# Patient Record
Sex: Female | Born: 1960
Health system: Southern US, Community
[De-identification: ages and names within clinical notes are randomized; demographics above are authoritative.]

## PROBLEM LIST (undated history)

## (undated) ENCOUNTER — Emergency Department: Disposition: A | Discharge status: Home / Self Care | Payer: Self-pay

## (undated) DIAGNOSIS — D649 Anemia, unspecified: Secondary | ICD-10-CM

## (undated) DIAGNOSIS — Z8709 Personal history of other diseases of the respiratory system: Secondary | ICD-10-CM

## (undated) DIAGNOSIS — G971 Other reaction to spinal and lumbar puncture: Secondary | ICD-10-CM

## (undated) DIAGNOSIS — I447 Left bundle-branch block, unspecified: Secondary | ICD-10-CM

## (undated) DIAGNOSIS — Z950 Presence of cardiac pacemaker: Secondary | ICD-10-CM

## (undated) DIAGNOSIS — Z95810 Presence of automatic (implantable) cardiac defibrillator: Secondary | ICD-10-CM

## (undated) DIAGNOSIS — Z8619 Personal history of other infectious and parasitic diseases: Secondary | ICD-10-CM

## (undated) DIAGNOSIS — G43909 Migraine, unspecified, not intractable, without status migrainosus: Secondary | ICD-10-CM

## (undated) DIAGNOSIS — T7840XA Allergy, unspecified, initial encounter: Secondary | ICD-10-CM

## (undated) DIAGNOSIS — R42 Dizziness and giddiness: Secondary | ICD-10-CM

## (undated) DIAGNOSIS — IMO0001 Reserved for inherently not codable concepts without codable children: Secondary | ICD-10-CM

## (undated) DIAGNOSIS — M25551 Pain in right hip: Secondary | ICD-10-CM

## (undated) DIAGNOSIS — I341 Nonrheumatic mitral (valve) prolapse: Secondary | ICD-10-CM

## (undated) DIAGNOSIS — F32A Depression, unspecified: Secondary | ICD-10-CM

## (undated) DIAGNOSIS — E785 Hyperlipidemia, unspecified: Secondary | ICD-10-CM

## (undated) DIAGNOSIS — I420 Dilated cardiomyopathy: Secondary | ICD-10-CM

## (undated) DIAGNOSIS — F329 Major depressive disorder, single episode, unspecified: Secondary | ICD-10-CM

## (undated) DIAGNOSIS — I5020 Unspecified systolic (congestive) heart failure: Secondary | ICD-10-CM

## (undated) DIAGNOSIS — I319 Disease of pericardium, unspecified: Secondary | ICD-10-CM

## (undated) DIAGNOSIS — I10 Essential (primary) hypertension: Secondary | ICD-10-CM

## (undated) DIAGNOSIS — G629 Polyneuropathy, unspecified: Secondary | ICD-10-CM

## (undated) DIAGNOSIS — K219 Gastro-esophageal reflux disease without esophagitis: Secondary | ICD-10-CM

## (undated) DIAGNOSIS — R059 Cough, unspecified: Secondary | ICD-10-CM

## (undated) DIAGNOSIS — R51 Headache: Secondary | ICD-10-CM

## (undated) DIAGNOSIS — R05 Cough: Secondary | ICD-10-CM

## (undated) DIAGNOSIS — N183 Chronic kidney disease, stage 3 unspecified: Secondary | ICD-10-CM

## (undated) DIAGNOSIS — R011 Cardiac murmur, unspecified: Secondary | ICD-10-CM

## (undated) DIAGNOSIS — I5022 Chronic systolic (congestive) heart failure: Secondary | ICD-10-CM

## (undated) DIAGNOSIS — J189 Pneumonia, unspecified organism: Secondary | ICD-10-CM

## (undated) HISTORY — DX: Unspecified systolic (congestive) heart failure: I50.20

## (undated) HISTORY — PX: TONSILLECTOMY: SUR1361

## (undated) HISTORY — DX: Dilated cardiomyopathy: I42.0

## (undated) HISTORY — PX: OTHER SURGICAL HISTORY: SHX169

## (undated) HISTORY — PX: COLONOSCOPY: SHX174

## (undated) HISTORY — DX: Pain in right hip: M25.551

## (undated) HISTORY — DX: Nonrheumatic mitral (valve) prolapse: I34.1

## (undated) HISTORY — DX: Allergy, unspecified, initial encounter: T78.40XA

## (undated) HISTORY — DX: Chronic kidney disease, stage 3 unspecified: N18.30

## (undated) HISTORY — PX: CARDIAC CATHETERIZATION: SHX172

## (undated) HISTORY — DX: Chronic systolic (congestive) heart failure: I50.22

## (undated) HISTORY — DX: Left bundle-branch block, unspecified: I44.7

## (undated) HISTORY — DX: Disease of pericardium, unspecified: I31.9

---

## 1998-03-27 DIAGNOSIS — M25551 Pain in right hip: Secondary | ICD-10-CM

## 1998-03-27 HISTORY — DX: Pain in right hip: M25.551

## 2004-04-03 ENCOUNTER — Other Ambulatory Visit: Payer: Self-pay

## 2004-04-03 ENCOUNTER — Emergency Department: Payer: Self-pay | Admitting: Emergency Medicine

## 2004-05-23 ENCOUNTER — Emergency Department: Payer: Self-pay | Admitting: Emergency Medicine

## 2004-06-16 ENCOUNTER — Inpatient Hospital Stay: Payer: Self-pay | Admitting: Surgery

## 2004-07-05 ENCOUNTER — Ambulatory Visit: Payer: Self-pay | Admitting: Unknown Physician Specialty

## 2004-08-07 ENCOUNTER — Ambulatory Visit: Payer: Self-pay

## 2004-12-05 ENCOUNTER — Ambulatory Visit: Payer: Self-pay | Admitting: Family Medicine

## 2004-12-19 ENCOUNTER — Ambulatory Visit: Payer: Self-pay | Admitting: Family Medicine

## 2005-01-13 ENCOUNTER — Ambulatory Visit: Payer: Self-pay | Admitting: Cardiology

## 2005-03-11 ENCOUNTER — Ambulatory Visit: Payer: Self-pay | Admitting: Unknown Physician Specialty

## 2005-03-24 ENCOUNTER — Ambulatory Visit: Payer: Self-pay | Admitting: Family Medicine

## 2005-05-15 ENCOUNTER — Ambulatory Visit: Payer: Self-pay | Admitting: Specialist

## 2005-06-14 ENCOUNTER — Ambulatory Visit: Payer: Self-pay | Admitting: Specialist

## 2006-02-10 ENCOUNTER — Ambulatory Visit: Payer: Self-pay | Admitting: General Surgery

## 2006-02-24 DIAGNOSIS — I447 Left bundle-branch block, unspecified: Secondary | ICD-10-CM

## 2006-02-24 DIAGNOSIS — I319 Disease of pericardium, unspecified: Secondary | ICD-10-CM

## 2006-02-24 HISTORY — DX: Left bundle-branch block, unspecified: I44.7

## 2006-02-24 HISTORY — DX: Disease of pericardium, unspecified: I31.9

## 2006-04-21 ENCOUNTER — Ambulatory Visit: Payer: Self-pay | Admitting: Unknown Physician Specialty

## 2007-02-25 HISTORY — PX: APPENDECTOMY: SHX54

## 2007-02-25 HISTORY — PX: OTHER SURGICAL HISTORY: SHX169

## 2007-05-10 ENCOUNTER — Observation Stay: Payer: Self-pay | Admitting: Internal Medicine

## 2007-05-10 ENCOUNTER — Other Ambulatory Visit: Payer: Self-pay

## 2007-06-23 ENCOUNTER — Ambulatory Visit: Payer: Self-pay | Admitting: Unknown Physician Specialty

## 2007-07-02 ENCOUNTER — Ambulatory Visit: Payer: Self-pay | Admitting: Family Medicine

## 2007-12-16 ENCOUNTER — Ambulatory Visit: Payer: Self-pay | Admitting: Podiatry

## 2008-06-27 ENCOUNTER — Ambulatory Visit: Payer: Self-pay | Admitting: Unknown Physician Specialty

## 2008-07-21 ENCOUNTER — Emergency Department: Payer: Self-pay | Admitting: Emergency Medicine

## 2009-05-25 ENCOUNTER — Inpatient Hospital Stay: Payer: Self-pay | Admitting: Internal Medicine

## 2009-07-05 ENCOUNTER — Ambulatory Visit: Payer: Self-pay | Admitting: Unknown Physician Specialty

## 2009-07-19 ENCOUNTER — Ambulatory Visit: Payer: Self-pay | Admitting: Unknown Physician Specialty

## 2009-07-30 ENCOUNTER — Other Ambulatory Visit: Payer: Self-pay | Admitting: Family Medicine

## 2010-04-06 ENCOUNTER — Ambulatory Visit: Payer: Self-pay | Admitting: Family Medicine

## 2010-04-25 LAB — HM PAP SMEAR: HM Pap smear: NORMAL

## 2010-06-20 ENCOUNTER — Ambulatory Visit: Payer: Self-pay | Admitting: Family Medicine

## 2010-06-25 LAB — HM MAMMOGRAPHY: HM Mammogram: NORMAL

## 2010-08-07 ENCOUNTER — Ambulatory Visit: Payer: Self-pay | Admitting: Unknown Physician Specialty

## 2010-08-09 ENCOUNTER — Ambulatory Visit: Payer: Self-pay | Admitting: Unknown Physician Specialty

## 2010-12-03 ENCOUNTER — Ambulatory Visit: Payer: Self-pay | Admitting: Unknown Physician Specialty

## 2010-12-20 LAB — HM COLONOSCOPY

## 2011-03-11 ENCOUNTER — Encounter: Payer: Self-pay | Admitting: Internal Medicine

## 2011-03-11 ENCOUNTER — Ambulatory Visit (INDEPENDENT_AMBULATORY_CARE_PROVIDER_SITE_OTHER): Payer: BC Managed Care – PPO | Admitting: Internal Medicine

## 2011-03-11 DIAGNOSIS — I219 Acute myocardial infarction, unspecified: Secondary | ICD-10-CM | POA: Insufficient documentation

## 2011-03-11 DIAGNOSIS — N951 Menopausal and female climacteric states: Secondary | ICD-10-CM

## 2011-03-11 DIAGNOSIS — G43909 Migraine, unspecified, not intractable, without status migrainosus: Secondary | ICD-10-CM

## 2011-03-11 DIAGNOSIS — E559 Vitamin D deficiency, unspecified: Secondary | ICD-10-CM

## 2011-03-11 DIAGNOSIS — R5381 Other malaise: Secondary | ICD-10-CM

## 2011-03-11 DIAGNOSIS — F32A Depression, unspecified: Secondary | ICD-10-CM

## 2011-03-11 DIAGNOSIS — Z780 Asymptomatic menopausal state: Secondary | ICD-10-CM

## 2011-03-11 DIAGNOSIS — Z124 Encounter for screening for malignant neoplasm of cervix: Secondary | ICD-10-CM

## 2011-03-11 DIAGNOSIS — M25551 Pain in right hip: Secondary | ICD-10-CM | POA: Insufficient documentation

## 2011-03-11 DIAGNOSIS — J329 Chronic sinusitis, unspecified: Secondary | ICD-10-CM

## 2011-03-11 DIAGNOSIS — I341 Nonrheumatic mitral (valve) prolapse: Secondary | ICD-10-CM | POA: Insufficient documentation

## 2011-03-11 DIAGNOSIS — M719 Bursopathy, unspecified: Secondary | ICD-10-CM

## 2011-03-11 DIAGNOSIS — Z1239 Encounter for other screening for malignant neoplasm of breast: Secondary | ICD-10-CM

## 2011-03-11 DIAGNOSIS — M7150 Other bursitis, not elsewhere classified, unspecified site: Secondary | ICD-10-CM

## 2011-03-11 DIAGNOSIS — F3289 Other specified depressive episodes: Secondary | ICD-10-CM

## 2011-03-11 DIAGNOSIS — R5383 Other fatigue: Secondary | ICD-10-CM | POA: Insufficient documentation

## 2011-03-11 DIAGNOSIS — I447 Left bundle-branch block, unspecified: Secondary | ICD-10-CM | POA: Insufficient documentation

## 2011-03-11 DIAGNOSIS — I2510 Atherosclerotic heart disease of native coronary artery without angina pectoris: Secondary | ICD-10-CM | POA: Insufficient documentation

## 2011-03-11 DIAGNOSIS — E785 Hyperlipidemia, unspecified: Secondary | ICD-10-CM

## 2011-03-11 DIAGNOSIS — I319 Disease of pericardium, unspecified: Secondary | ICD-10-CM | POA: Insufficient documentation

## 2011-03-11 DIAGNOSIS — K219 Gastro-esophageal reflux disease without esophagitis: Secondary | ICD-10-CM

## 2011-03-11 DIAGNOSIS — J45909 Unspecified asthma, uncomplicated: Secondary | ICD-10-CM | POA: Insufficient documentation

## 2011-03-11 DIAGNOSIS — F329 Major depressive disorder, single episode, unspecified: Secondary | ICD-10-CM

## 2011-03-11 LAB — CBC WITH DIFFERENTIAL/PLATELET
Basophils Absolute: 0 10*3/uL (ref 0.0–0.1)
Basophils Relative: 0.8 % (ref 0.0–3.0)
Eosinophils Absolute: 0.1 10*3/uL (ref 0.0–0.7)
Eosinophils Relative: 1.9 % (ref 0.0–5.0)
HCT: 39.5 % (ref 36.0–46.0)
Hemoglobin: 13.3 g/dL (ref 12.0–15.0)
Lymphocytes Relative: 35.8 % (ref 12.0–46.0)
Lymphs Abs: 1.7 10*3/uL (ref 0.7–4.0)
MCHC: 33.6 g/dL (ref 30.0–36.0)
MCV: 96.3 fl (ref 78.0–100.0)
Monocytes Absolute: 0.4 10*3/uL (ref 0.1–1.0)
Monocytes Relative: 8.8 % (ref 3.0–12.0)
Neutro Abs: 2.6 10*3/uL (ref 1.4–7.7)
Neutrophils Relative %: 52.7 % (ref 43.0–77.0)
Platelets: 227 10*3/uL (ref 150.0–400.0)
RBC: 4.1 Mil/uL (ref 3.87–5.11)
RDW: 13.4 % (ref 11.5–14.6)
WBC: 4.8 10*3/uL (ref 4.5–10.5)

## 2011-03-11 LAB — COMPREHENSIVE METABOLIC PANEL
ALT: 12 U/L (ref 0–35)
AST: 18 U/L (ref 0–37)
Albumin: 3.9 g/dL (ref 3.5–5.2)
Alkaline Phosphatase: 54 U/L (ref 39–117)
BUN: 16 mg/dL (ref 6–23)
CO2: 23 mEq/L (ref 19–32)
Calcium: 9 mg/dL (ref 8.4–10.5)
Chloride: 111 mEq/L (ref 96–112)
Creatinine, Ser: 1 mg/dL (ref 0.4–1.2)
GFR: 62.43 mL/min (ref >60.00)
Glucose, Bld: 87 mg/dL (ref 70–99)
Potassium: 4 mEq/L (ref 3.5–5.1)
Sodium: 141 mEq/L (ref 135–145)
Total Bilirubin: 0.7 mg/dL (ref 0.3–1.2)
Total Protein: 7 g/dL (ref 6.0–8.3)

## 2011-03-11 LAB — LIPID PANEL
Cholesterol: 257 mg/dL — ABNORMAL HIGH (ref 0–200)
HDL: 50.1 mg/dL (ref >39.00)
Total CHOL/HDL Ratio: 5
Triglycerides: 103 mg/dL (ref 0.0–149.0)
VLDL: 20.6 mg/dL (ref 0.0–40.0)

## 2011-03-11 LAB — LDL CHOLESTEROL, DIRECT: Direct LDL: 185 mg/dL

## 2011-03-11 LAB — SEDIMENTATION RATE: Sed Rate: 10 mm/hr (ref 0–22)

## 2011-03-11 MED ORDER — TRAMADOL HCL 50 MG PO TABS: 50.0000 mg | ORAL_TABLET | Freq: Three times a day (TID) | ORAL | Status: DC | PRN

## 2011-03-11 MED ORDER — DULOXETINE HCL 30 MG PO CPEP: 60.0000 mg | ORAL_CAPSULE | Freq: Every day | ORAL | Status: DC

## 2011-03-11 NOTE — Patient Instructions (Signed)
I would like to switch you from escitalopram to cymbalta for pain issues and depression.  Start with 30 mg daily and increase to 60 mg daily in two weeks.  I am adding tramadol for nighttime pain control ,  As a substitute for ibuprofen

## 2011-03-11 NOTE — Assessment & Plan Note (Addendum)
secondary to depression aggravated by chronic pain and multiple non life threatening comorbodities.  Spent 60 minutes discussing her medical conditions and current emotional state.  First goal is to improve her sleep and depression with switch to cymbalta from lexapro.  Return in one month.
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
## 2011-03-11 NOTE — Assessment & Plan Note (Signed)
With last episode in October triggered by deer dander.  Has an aversion to prednisone due to side effects of diaphoresis, near syncope, racing heart, insomnia and absolute horrible feeling and never wants to be placed on it again.  Managed by Dr. Meredeth Ide.

## 2011-03-12 LAB — VITAMIN D 25 HYDROXY (VIT D DEFICIENCY, FRACTURES): Vit D, 25-Hydroxy: 52 ng/mL (ref 30–89)

## 2011-03-13 ENCOUNTER — Telehealth: Payer: Self-pay | Admitting: *Deleted

## 2011-03-13 NOTE — Telephone Encounter (Signed)
Prior auth needed for twice daily dosing on cymbalta 60 mg's, form is in your red folder.

## 2011-03-14 MED ORDER — ESCITALOPRAM OXALATE 20 MG PO TABS: 20.0000 mg | ORAL_TABLET | Freq: Every day | ORAL | Status: DC

## 2011-03-14 NOTE — Telephone Encounter (Signed)
See note below.  Pt will run out of her medicine this week end and is asking if you want to refill her lexapro or change the directions on cymbalta to one a day so that her insurance will pay for it. She will still take 2 a day and hopefully we will hear back on her prior auth next week

## 2011-03-14 NOTE — Telephone Encounter (Signed)
Cymbalta is what I wanted.

## 2011-03-14 NOTE — Telephone Encounter (Signed)
The request is inaccurate, the  cymbalta prescription was written for 30 mg tablets,  With qty #60 so her dose can be titrated ton 60 mg daily after 1 to 2 weeks.  I have filled out and corrected the prior auth and have refilled the lexapro at a higher dose for interim use sinceshe has not been tried at 20 mg and they will likely make [name] try that first before approving the change to cymbalta.  Please call the 20 mg generic lexapro in to her family for a #30 days supple with 1 refill so seh doesn't go without treatment while we wait for approval  Thanks!

## 2011-03-14 NOTE — Telephone Encounter (Signed)
Her insurance will cover the 30 mg cymbalta, and the 60 mg's at one a day, so, do you want her to go with cymbalta or lexapro.  She said she will take whatever you think is best for her.

## 2011-03-14 NOTE — Telephone Encounter (Signed)
Cymbalta.  30 mg  daily for 2 weeks, then increase  to 60 mg daily  As per her patient instructions.

## 2011-03-14 NOTE — Telephone Encounter (Signed)
Advised pt.  Cymbalta called to [facility], # 60, no refills.

## 2011-03-25 ENCOUNTER — Telehealth: Payer: Self-pay | Admitting: *Deleted

## 2011-03-25 NOTE — Telephone Encounter (Signed)
Patient wanted to call and let you that she was seen at [facility] last night. She went in with having mild chest pain and with her hear conditions she wanted to make sure that everything was okay. While she was there they gave her a shot of toradol and also gave her rx for a muscle relaxer. She says that she is feeling better now. She just wanted you to be aware. I advised her to call us for appt if she needs to be seen.

## 2011-04-17 ENCOUNTER — Ambulatory Visit (INDEPENDENT_AMBULATORY_CARE_PROVIDER_SITE_OTHER): Payer: BC Managed Care – PPO | Admitting: Internal Medicine

## 2011-04-17 ENCOUNTER — Encounter: Payer: Self-pay | Admitting: Internal Medicine

## 2011-04-17 VITALS — BP 98/66 | HR 84 | Temp 97.7°F | Wt 151.0 lb

## 2011-04-17 DIAGNOSIS — R230 Cyanosis: Secondary | ICD-10-CM

## 2011-04-17 DIAGNOSIS — R5383 Other fatigue: Secondary | ICD-10-CM

## 2011-04-17 DIAGNOSIS — F3289 Other specified depressive episodes: Secondary | ICD-10-CM

## 2011-04-17 DIAGNOSIS — M25559 Pain in unspecified hip: Secondary | ICD-10-CM

## 2011-04-17 DIAGNOSIS — J45909 Unspecified asthma, uncomplicated: Secondary | ICD-10-CM

## 2011-04-17 DIAGNOSIS — F32A Depression, unspecified: Secondary | ICD-10-CM

## 2011-04-17 DIAGNOSIS — F329 Major depressive disorder, single episode, unspecified: Secondary | ICD-10-CM

## 2011-04-17 DIAGNOSIS — R5381 Other malaise: Secondary | ICD-10-CM

## 2011-04-17 DIAGNOSIS — E785 Hyperlipidemia, unspecified: Secondary | ICD-10-CM

## 2011-04-17 DIAGNOSIS — M25551 Pain in right hip: Secondary | ICD-10-CM

## 2011-04-17 MED ORDER — DULOXETINE HCL 60 MG PO CPEP: 60.0000 mg | ORAL_CAPSULE | Freq: Every day | ORAL | Status: DC

## 2011-04-17 MED ORDER — TRAMADOL HCL 50 MG PO TABS: 50.0000 mg | ORAL_TABLET | Freq: Three times a day (TID) | ORAL | Status: AC | PRN

## 2011-04-17 NOTE — Patient Instructions (Signed)
Vascular evaluation of= right foot ciruclation   Change Cymlata to 60 mg daily in the morning     Continue tramadol  As needed for pain
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
## 2011-04-20 ENCOUNTER — Encounter: Payer: Self-pay | Admitting: Internal Medicine

## 2011-04-20 DIAGNOSIS — I420 Dilated cardiomyopathy: Secondary | ICD-10-CM | POA: Insufficient documentation

## 2011-04-20 NOTE — Assessment & Plan Note (Signed)
Improved with use of tramadol and cymbalta.

## 2011-04-20 NOTE — Assessment & Plan Note (Signed)
Diagnosis is unclear. She had no history as a child, but had a recent admission for exacerbation which led to demand ischemia.  Referral to  Dr Particia Lather for formal testing.

## 2011-04-30 ENCOUNTER — Encounter: Payer: Self-pay | Admitting: Pulmonary Disease

## 2011-04-30 ENCOUNTER — Ambulatory Visit (INDEPENDENT_AMBULATORY_CARE_PROVIDER_SITE_OTHER): Payer: BC Managed Care – PPO | Admitting: Pulmonary Disease

## 2011-04-30 DIAGNOSIS — J45909 Unspecified asthma, uncomplicated: Secondary | ICD-10-CM

## 2011-04-30 NOTE — Assessment & Plan Note (Signed)
Suzanne Spears is a very pleasant 51 y/o female with allergic rhinitis and intermittent symptoms of chest tightness, shortness of breath and wheezing that I think are consistent with mild intermittent asthma.  Because she appears to be euvolemic with excellent BP and HR control I suspect that her dyspnea on exertion is more related to asthma than due to her heart failure.    Spirometry today was not consistent with an adequate study so we will send her to [facility] for spirometry before and after bronchodilators to better assess her lung function and obstruction reversibility.  In the interim I have instructed her to continue using her sinus regimen as her allergic rhinitis control will dovetail with her asthma control.   I asked her to use albuterol when she gets short of breath when she exerts herself to see if this symptom is due to airway spasm.  We will see her back in 1-2 months
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
## 2011-04-30 NOTE — Patient Instructions (Signed)
We will send you to for spirometry before and after bronchodilator at [facility]. Continue taking your medications as needed.  Use albuterol as needed for shortness of breath.  We will see you back in 2 months, sooner if needed.
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
## 2011-04-30 NOTE — Assessment & Plan Note (Signed)
As above, continue using sinus meds as written.

## 2011-05-01 ENCOUNTER — Other Ambulatory Visit (INDEPENDENT_AMBULATORY_CARE_PROVIDER_SITE_OTHER): Payer: BC Managed Care – PPO | Admitting: *Deleted

## 2011-05-01 DIAGNOSIS — Z13220 Encounter for screening for lipoid disorders: Secondary | ICD-10-CM

## 2011-05-01 LAB — LIPID PANEL
Cholesterol: 216 mg/dL — ABNORMAL HIGH (ref 0–200)
HDL: 49.6 mg/dL (ref >39.00)
Total CHOL/HDL Ratio: 4
Triglycerides: 119 mg/dL (ref 0.0–149.0)
VLDL: 23.8 mg/dL (ref 0.0–40.0)

## 2011-05-01 LAB — LDL CHOLESTEROL, DIRECT: Direct LDL: 151.6 mg/dL

## 2011-05-12 ENCOUNTER — Telehealth: Payer: Self-pay | Admitting: *Deleted

## 2011-05-12 NOTE — Telephone Encounter (Signed)
Triage Record Num: [id number] Operator: Freddie Breech Patient Name: Suzanne Spears Call Date & Time: 05/12/2011 1:43:33PM Patient Phone: [phone] PCP: Duncan Dull Patient Gender: Female PCP Fax : [phone] Patient DOB: [birth year] Practice Name: [facility] Reason for Call: Caller: [name]; PCP: Duncan Dull; CB#: [phone]; LMP 04/14/11 Call regarding Diarrhea; Diarrhea x 2 q d onset 05/09/11. Afebrile today. Voiding qs per pt. Diarrhea Protocol. Advised see in 24 hrs if sx persist. Call back parameters reviewed. Protocol(s) Used: Diarrhea or Other Change in Bowel Habits Recommended Outcome per Protocol: See Provider within 24 hours Reason for Outcome: Diarrhea lasting longer than 24 hours AND not improving with home care Care Advice: Do not take aspirin, ibuprofen, ketoprofen, naproxen, etc., or other pain relieving medications until consulting with provider. ~ ~ SYMPTOM / CONDITION MANAGEMENT Go to the ED if you have developed signs and symptoms of dehydration such as very dry mouth and tongue; increased pulse rate at rest; no urine output for 8 hours or more; increasing weakness or drowsiness, or lightheadedness when trying to sit upright or standing. ~ Diarrheal Care: - Drink 2-3 quarts (2-3 liters) per day of low sugar content fluids, including over the counter oral hydration solution, unless directed otherwise by provider. - If accompanied by vomiting, take the fluids in frequent small sips or suck on ice chips. - Eat easily digested foods (such as bananas, rice, applesauce, toast, cooked cereals, soup, crackers, baked or boiled potato, or baked chicken or [place] without skin). - Do not eat high fiber, high fat, high sugar content foods, or highly seasoned foods. - Do not drink caffeinated or alcoholic beverages. - Avoid milk and milk products while having symptoms. As symptoms improve, gradually add back to diet. - Application of A&D  ointment or witch hazel medicated pads may help anal irritation. - Antidiarrheal medications are usually unnecessary. If symptoms are severe, consider nonprescription antidiarrheal and anti-motility drugs as directed by label or a provider. Do not take if have high fever or bloody diarrhea. If pregnant, do not take any medications not approved by your provider. - Consult your provider for advice regarding continuing prescription medication. ~ 05/12/2011 2:01:24PM Page 1 of 1 CAN_TriageRpt

## 2011-05-13 NOTE — Telephone Encounter (Signed)
Left message advising patient to call and make appt if symptoms persist.

## 2011-05-14 ENCOUNTER — Telehealth: Payer: Self-pay | Admitting: Internal Medicine

## 2011-05-14 NOTE — Telephone Encounter (Signed)
[facility] Triage Call Report Triage Record Num: [id number] Operator: Chevis Pretty Patient Name: Suzanne Spears Call Date & Time: 05/14/2011 11:04:28AM Patient Phone: [phone] PCP: Duncan Dull Patient Gender: Female PCP Fax : [phone] Patient DOB: 1960-12-07 Practice Name: [facility] Reason for Call: Caller: [name]; PCP: Duncan Dull; CB#: [phone]; ; ; Call regarding Diarrhea; triaged 05/12/11 and advised appt at that time for See within 24 hours' disposition, but could not get appt. Patient states she is still having diarrhea. Taking probiotics as advised. Afebrile. Per protocol, emergent symptoms denied; advised appt within 24 hours. Appts unavailable in Epic until 05/16/11; to call office for appt in AM 05/15/11. Home care measures reviewed, with callback parameters given. Protocol(s) Used: Diarrhea or Other Change in Bowel Habits Recommended Outcome per Protocol: See Provider within 24 hours Reason for Outcome: Diarrhea lasting longer than 24 hours AND not improving with home care Care Advice: ~ Maintain dietary recommendations for pre-existing conditions. Do not take aspirin, ibuprofen, ketoprofen, naproxen, etc., or other pain relieving medications until consulting with provider. ~ ~ Tell provider if you have traveled to an area where infections are common. ~ SYMPTOM / CONDITION MANAGEMENT ~ CAUTIONS Go to the ED if you have developed signs and symptoms of dehydration such as very dry mouth and tongue; increased pulse rate at rest; no urine output for 8 hours or more; increasing weakness or drowsiness, or lightheadedness when trying to sit upright or standing. ~ Diarrheal Care: - Drink 2-3 quarts (2-3 liters) per day of low sugar content fluids, including over the counter oral hydration solution, unless directed otherwise by provider. - If accompanied by vomiting, take the fluids in frequent small sips or suck on ice  chips. - Eat easily digested foods (such as bananas, rice, applesauce, toast, cooked cereals, soup, crackers, baked or boiled potato, or baked chicken or [place] without skin). - Do not eat high fiber, high fat, high sugar content foods, or highly seasoned foods. - Do not drink caffeinated or alcoholic beverages. - Avoid milk and milk products while having symptoms. As symptoms improve, gradually add back to diet. - Application of A&D ointment or witch hazel medicated pads may help anal irritation. - Antidiarrheal medications are usually unnecessary. If symptoms are severe, consider nonprescription antidiarrheal and anti-motility drugs as directed by label or a provider. Do not take if have high fever or bloody diarrhea. If pregnant, do not take any medications not approved by your provider. - Consult your provider for advice regarding continuing prescription medication. ~ 05/14/2011 11:25:57AM Page 1 of 1 [email or web address]

## 2011-05-14 NOTE — Telephone Encounter (Signed)
Caller: [name]; PCP: Duncan Dull; CB#: [phone]; ; ; Call regarding Diarrhea; triaged 05/12/11 and advised appt at that time for See within 24 hours' disposition, but could not get appt.  Patient states she is still having diarrhea.  Taking probiotics as advised.  Afebrile.  Per protocol, emergent symptoms denied; advised appt within 24 hours.  Appts unavailable in Epic until 05/16/11; to call office for appt in AM 05/15/11.  Home care measures reviewed, with callback parameters given.

## 2011-05-14 NOTE — Telephone Encounter (Signed)
I called patient she stated she was advised by someone in the office (she couldn't remember who) that you had no open appts so she needed to be seen at Urgent Care.  She was fine with this and is going this afternoon.

## 2011-05-16 ENCOUNTER — Telehealth: Payer: Self-pay | Admitting: Internal Medicine

## 2011-05-16 NOTE — Telephone Encounter (Signed)
Patient stated they did an internal ultrasound and an abdominal ultrasound done at [facility].  She stated the only medication they prescribed was something for nausea and something to stop the diarrhea.  I have scheduled patient for next Tuesday.

## 2011-05-16 NOTE — Telephone Encounter (Signed)
Please call patient and find out what imaging study they did to diagnose the ovarian cyst and the colitis and whtehter they are treating her with antibiotics.  Seh will need follow up early next week , or today if she feels she is not getting better.

## 2011-05-16 NOTE — Telephone Encounter (Signed)
Caller: Tykesha/Patient; Phone Number: [phone]; Message from caller: Pt calling to advise she was seen at [facility] for persistent abd pain; dx w/cyst on ovary and onset of colitis.  Please call to discuss.

## 2011-05-19 NOTE — Telephone Encounter (Signed)
Can you please request the images from [facility] on  this patient?.  She had an abdominal and pelvic ultrasound last week

## 2011-05-20 ENCOUNTER — Ambulatory Visit (INDEPENDENT_AMBULATORY_CARE_PROVIDER_SITE_OTHER): Payer: BC Managed Care – PPO | Admitting: Internal Medicine

## 2011-05-20 ENCOUNTER — Encounter: Payer: Self-pay | Admitting: Internal Medicine

## 2011-05-20 VITALS — BP 99/68 | HR 97 | Temp 97.6°F | Resp 16 | Wt 138.0 lb

## 2011-05-20 DIAGNOSIS — R197 Diarrhea, unspecified: Secondary | ICD-10-CM

## 2011-05-20 DIAGNOSIS — K529 Noninfective gastroenteritis and colitis, unspecified: Secondary | ICD-10-CM | POA: Insufficient documentation

## 2011-05-20 LAB — COMPLETE METABOLIC PANEL WITH GFR
ALT: 10 U/L (ref 0–35)
AST: 16 U/L (ref 0–37)
Albumin: 4.3 g/dL (ref 3.5–5.2)
Alkaline Phosphatase: 62 U/L (ref 39–117)
BUN: 15 mg/dL (ref 6–23)
CO2: 21 mEq/L (ref 19–32)
Calcium: 9 mg/dL (ref 8.4–10.5)
Chloride: 111 mEq/L (ref 96–112)
Creat: 1.06 mg/dL (ref 0.50–1.10)
GFR, Est African American: 71 mL/min
GFR, Est Non African American: 62 mL/min
Glucose, Bld: 81 mg/dL (ref 70–99)
Potassium: 3.7 mEq/L (ref 3.5–5.3)
Sodium: 140 mEq/L (ref 135–145)
Total Bilirubin: 0.3 mg/dL (ref 0.3–1.2)
Total Protein: 6.3 g/dL (ref 6.0–8.3)

## 2011-05-20 LAB — CBC WITH DIFFERENTIAL/PLATELET
Basophils Absolute: 0 10*3/uL (ref 0.0–0.1)
Basophils Relative: 0.6 % (ref 0.0–3.0)
Eosinophils Absolute: 0.1 10*3/uL (ref 0.0–0.7)
Eosinophils Relative: 1 % (ref 0.0–5.0)
HCT: 40 % (ref 36.0–46.0)
Hemoglobin: 13 g/dL (ref 12.0–15.0)
Lymphocytes Relative: 33.9 % (ref 12.0–46.0)
Lymphs Abs: 1.8 10*3/uL (ref 0.7–4.0)
MCHC: 32.5 g/dL (ref 30.0–36.0)
MCV: 95.5 fl (ref 78.0–100.0)
Monocytes Absolute: 0.4 10*3/uL (ref 0.1–1.0)
Monocytes Relative: 7.8 % (ref 3.0–12.0)
Neutro Abs: 3 10*3/uL (ref 1.4–7.7)
Neutrophils Relative %: 56.7 % (ref 43.0–77.0)
Platelets: 196 10*3/uL (ref 150.0–400.0)
RBC: 4.19 Mil/uL (ref 3.87–5.11)
RDW: 13.4 % (ref 11.5–14.6)
WBC: 5.3 10*3/uL (ref 4.5–10.5)

## 2011-05-20 LAB — MAGNESIUM: Magnesium: 2 mg/dL (ref 1.5–2.5)

## 2011-05-20 LAB — SEDIMENTATION RATE: Sed Rate: 9 mm/hr (ref 0–22)

## 2011-05-20 MED ORDER — METRONIDAZOLE 500 MG PO TABS: 500.0000 mg | ORAL_TABLET | Freq: Three times a day (TID) | ORAL | Status: AC

## 2011-05-20 MED ORDER — CIPROFLOXACIN HCL 500 MG PO TABS: 500.0000 mg | ORAL_TABLET | Freq: Two times a day (BID) | ORAL | Status: AC

## 2011-05-20 NOTE — Patient Instructions (Addendum)
I am prescribing two antibiotics to treat your diarrhea.  Take them for the next 7 days. (cipro and metronidazole)  Please sign a release form for dr. Earnest Conroy last colonoscopy so we can get it ASAP (front office has form)  Do not drink any alcohol within 24 hrs of taking metronidazole bc it will mnake you very sic k
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
## 2011-05-22 ENCOUNTER — Encounter: Payer: Self-pay | Admitting: Internal Medicine

## 2011-05-22 NOTE — Assessment & Plan Note (Signed)
Symptoms consistent with infectious gastroenteritis vs colitis.  She is not sure whether she had diverticulosis on last colonoscopy, which is not available.  Norovirus should not have lasted this long.  In the absence of bloody stools and persistent abdominal pain, ischemic colitis is less likely but  Considered given her history of dilated cardiomyopathy.  Will treat empirically with ciprofloxacin and metronidazole for 7 days. Try to obtain last colonoscopy report from Dr. Mechele Collin.  Since her ESR and CBC were normal I do not feel it is necessary to CT her abdomen/pelvis at this time. If her diarrhea persists after treatment with abx,  Will refer back to [facility] for evaluation for collagenous colitis or other noninfectious etiologies.

## 2011-05-27 ENCOUNTER — Telehealth: Payer: Self-pay | Admitting: Pulmonary Disease

## 2011-05-27 NOTE — Telephone Encounter (Signed)
Will forward to [facility] box to schedule pre and post spiro at [facility]. I already placed the order when she was seen last 04-30-11. Please schedule, thanks!

## 2011-05-28 NOTE — Telephone Encounter (Signed)
[email or web address]  06/05/11@11 :00am for pft pt is aware Tobe Sos

## 2011-05-30 ENCOUNTER — Telehealth: Payer: Self-pay | Admitting: Internal Medicine

## 2011-05-30 DIAGNOSIS — R197 Diarrhea, unspecified: Secondary | ICD-10-CM

## 2011-05-30 MED ORDER — DIPHENOXYLATE-ATROPINE 2.5-0.025 MG PO TABS: 1.0000 | ORAL_TABLET | Freq: Four times a day (QID) | ORAL | Status: AC | PRN

## 2011-05-30 NOTE — Telephone Encounter (Signed)
Patient notified and Rx has been sent in. 

## 2011-05-30 NOTE — Telephone Encounter (Signed)
Yes she can take immodiuym.  I have also sent a prescription to [facility] for Lomotil , which is a stronger anti-diarrhea medication  That she can take if the immodium doesn't work

## 2011-05-30 NOTE — Telephone Encounter (Signed)
Patient called and very frustrated because she is still having diarrhea.  She left a message earlier but I just got the chance to check my voicemail.  She stated she does not have pain or discomfort now its just diarrhea.  She said it does have some substance now its not just watery.  She has been off the antibiotic since Tuesday.  She stated she has had diarrhea 6 times today.  She wanted to know if she could take some imodium or what you suggested.  Please advise.

## 2011-06-02 ENCOUNTER — Telehealth: Payer: Self-pay | Admitting: Internal Medicine

## 2011-06-02 NOTE — Telephone Encounter (Signed)
[facility] Triage Call Report Triage Record Num: [id number] Operator: Lodema Pilot Patient Name: Suzanne Spears Call Date & Time: 05/30/2011 5:06:09PM Patient Phone: [phone] PCP: Duncan Dull Patient Gender: Female PCP Fax : [phone] Patient DOB: [birth year] Practice Name: [facility] Reason for Call: Caller: Louetta/Patient; PCP: Duncan Dull; CB#: [phone]; ; ; Call regarding Patient Said Called 3. Times Today Stomach Problems; Pain, diarrhea. Onset: 05/12/11. Diarrhea and Abd pain. RX Flagyl and Unknown Antibiotic was prescribed. Last dose was taken on 05/27/11. During antibiotics, stools were "watery." Stools are now "foam" like. Abd pain has improved, but pt is having diarrhea everytime she goes to bathroom. Afebrile. On 05/30/11, diarrhea 6-7 times. Feeling tired and exhausted in evenings. Denies dizziness or nausea. Per Diarrhea or Other Change in Bowel Habits Protocol, spoke with Dr. Beverely Low. Ok to take Immodium as directed per package. Rehydrate - increase fluids. If symptoms worsen, UC. Care advice given. Pts Cell # [phone], Home # [phone]. FYI: Pt was able to speak with Morrie Sheldon from the office. Morrie Sheldon will be giving message to Dr. Darrick Huntsman. Protocol(s) Used: Diarrhea or Other Change in Bowel Habits Recommended Outcome per Protocol: See Provider within 24 hours Reason for Outcome: Diarrhea lasting longer than 24 hours AND not improving with home care Care Advice: Do not take aspirin, ibuprofen, ketoprofen, naproxen, etc., or other pain relieving medications until consulting with provider. ~ ~ SYMPTOM / CONDITION MANAGEMENT ~ CAUTIONS Diarrheal Care: - Drink 2-3 quarts (2-3 liters) per day of low sugar content fluids, including over the counter oral hydration solution, unless directed otherwise by provider. - If accompanied by vomiting, take the fluids in frequent small sips or suck on ice chips. - Eat easily digested foods (such as  bananas, rice, applesauce, toast, cooked cereals, soup, crackers, baked or boiled potato, or baked chicken or [place] without skin). - Do not eat high fiber, high fat, high sugar content foods, or highly seasoned foods. - Do not drink caffeinated or alcoholic beverages. - Avoid milk and milk products while having symptoms. As symptoms improve, gradually add back to diet. - Application of A&D ointment or witch hazel medicated pads may help anal irritation. - Antidiarrheal medications are usually unnecessary. If symptoms are severe, consider nonprescription antidiarrheal and anti-motility drugs as directed by label or a provider. Do not take if have high fever or bloody diarrhea. If pregnant, do not take any medications not approved by your provider. - Consult your provider for advice regarding continuing prescription medication. ~ 05/30/2011 5:52:24PM Page 1 of 1 [email or web address]

## 2011-06-05 ENCOUNTER — Ambulatory Visit: Payer: Self-pay | Admitting: Pulmonary Disease

## 2011-06-05 LAB — PULMONARY FUNCTION TEST

## 2011-06-09 ENCOUNTER — Telehealth: Payer: Self-pay | Admitting: *Deleted

## 2011-06-09 NOTE — Telephone Encounter (Signed)
Spoke with pt and notified of results per Dr.McQuaid. Pt verbalized understanding and denied any questions. 

## 2011-06-09 NOTE — Telephone Encounter (Signed)
Message copied by Christen Butter on Mon Jun 09, 2011  4:38 PM ------      Message from: Veto Kemps B      Created: Mon Jun 09, 2011  4:19 PM       L,            Please let her know that her PFT's looked like mild asthma and there is nothing more to do until she and I talk about it in our next clinic visit.            B

## 2011-06-23 ENCOUNTER — Other Ambulatory Visit: Payer: Self-pay | Admitting: Internal Medicine

## 2011-06-23 MED ORDER — NORETHINDRONE ACET-ETHINYL EST 1-20 MG-MCG PO TABS: 1.0000 | ORAL_TABLET | Freq: Every day | ORAL | Status: DC

## 2011-06-26 ENCOUNTER — Encounter: Payer: Self-pay | Admitting: Pulmonary Disease

## 2011-06-26 ENCOUNTER — Ambulatory Visit (INDEPENDENT_AMBULATORY_CARE_PROVIDER_SITE_OTHER): Payer: BC Managed Care – PPO | Admitting: Internal Medicine

## 2011-06-26 VITALS — BP 112/70 | HR 86 | Temp 97.9°F | Resp 16 | Wt 142.8 lb

## 2011-06-26 DIAGNOSIS — K529 Noninfective gastroenteritis and colitis, unspecified: Secondary | ICD-10-CM

## 2011-06-26 NOTE — Assessment & Plan Note (Signed)
The diarrhea is nonbloody and has been present for nearly 6 weeks now. It is not to be appear to be an infectious component to this since she is afebrile and has intermittent days of normal or at least soft stools. Discussed making dietary changes to see whether her diarrhea; and lactose intolerance. She will try a lactose-free diet for 2 weeks and let me know the results.
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
## 2011-06-26 NOTE — Patient Instructions (Signed)
Dairy free diet for two weeks.  Use  Lactase before any suspicous meal.  Lactaid,  Almond milk are milk alternatives both available at [facility]

## 2011-07-07 ENCOUNTER — Encounter: Payer: Self-pay | Admitting: Pulmonary Disease

## 2011-07-07 ENCOUNTER — Ambulatory Visit (INDEPENDENT_AMBULATORY_CARE_PROVIDER_SITE_OTHER): Payer: BC Managed Care – PPO | Admitting: Pulmonary Disease

## 2011-07-07 VITALS — BP 108/64 | HR 70 | Temp 98.1°F | Ht 66.0 in | Wt 146.0 lb

## 2011-07-07 DIAGNOSIS — J45909 Unspecified asthma, uncomplicated: Secondary | ICD-10-CM

## 2011-07-07 NOTE — Assessment & Plan Note (Signed)
She is basically asymptomatic from an asthma standpoint with some cough noted with increasing allergic rhinitis.  I was surprised by her mild airway obstruction from [facility] with a trend towards air trapping by lung volumes.  She is a never smoker and does not have evidence of bronchiectasis.  This airway obstruction is likely age related and related to her mild intermittent asthma.  There is no indication for a change to her current regimen based on the mild nature of her symptoms.    Plan: -continue treatment for the allergic rhinitis as we are doing -see [name] in the fall to see if her symptoms are developing -if and when she develops bronchitis next, would consider starting steroids sooner rather than later based on her airway obstruction

## 2011-07-07 NOTE — Patient Instructions (Signed)
Continue taking the flonase, singulair, and albuterol as you are doing. Exercise regularly, try to make it a goal to exercise 35 minutes daily. We will see you back in 6 months or sooner if needed.
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
## 2011-11-07 ENCOUNTER — Telehealth: Payer: Self-pay | Admitting: Pulmonary Disease

## 2011-11-07 DIAGNOSIS — J329 Chronic sinusitis, unspecified: Secondary | ICD-10-CM

## 2011-11-07 MED ORDER — ALBUTEROL SULFATE HFA 108 (90 BASE) MCG/ACT IN AERS: 2.0000 | INHALATION_SPRAY | Freq: Four times a day (QID) | RESPIRATORY_TRACT | Status: DC | PRN

## 2011-11-07 MED ORDER — MONTELUKAST SODIUM 10 MG PO TABS: 10.0000 mg | ORAL_TABLET | Freq: Every day | ORAL | Status: DC

## 2011-11-07 MED ORDER — FLUTICASONE PROPIONATE 50 MCG/ACT NA SUSP: 2.0000 | Freq: Every day | NASAL | Status: DC

## 2011-11-07 NOTE — Telephone Encounter (Signed)
Rxs were sent to pharm  LMOM for the pt to be made aware   

## 2011-12-29 ENCOUNTER — Ambulatory Visit (INDEPENDENT_AMBULATORY_CARE_PROVIDER_SITE_OTHER): Payer: BC Managed Care – PPO | Admitting: Internal Medicine

## 2011-12-29 ENCOUNTER — Encounter: Payer: Self-pay | Admitting: Internal Medicine

## 2011-12-29 VITALS — BP 134/87 | HR 82 | Temp 98.1°F | Ht 66.0 in | Wt 148.8 lb

## 2011-12-29 DIAGNOSIS — H6692 Otitis media, unspecified, left ear: Secondary | ICD-10-CM

## 2011-12-29 DIAGNOSIS — K529 Noninfective gastroenteritis and colitis, unspecified: Secondary | ICD-10-CM

## 2011-12-29 DIAGNOSIS — H6690 Otitis media, unspecified, unspecified ear: Secondary | ICD-10-CM

## 2011-12-29 DIAGNOSIS — Z1239 Encounter for other screening for malignant neoplasm of breast: Secondary | ICD-10-CM

## 2011-12-29 MED ORDER — AZITHROMYCIN 500 MG PO TABS: ORAL_TABLET | ORAL | Status: DC

## 2011-12-29 NOTE — Patient Instructions (Addendum)
You have an URI and your left ear looks infected.  Continue flonase and saline irrigation.   Add benadryl 25 mg every 8 hours for post nasal drip  Use sudafed daily to help decompress the left ear.   Add azithromycin 500 mg daily for 7 days as an antibiotic for the ear.   Use ibuprofen and tylenol for the aches and pains
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
## 2011-12-31 ENCOUNTER — Encounter: Payer: Self-pay | Admitting: Internal Medicine

## 2011-12-31 DIAGNOSIS — H6692 Otitis media, unspecified, left ear: Secondary | ICD-10-CM | POA: Insufficient documentation

## 2011-12-31 NOTE — Assessment & Plan Note (Signed)
She continues to report on diarrhea but really isn't having more than once loose daily if at all.  I offered to refer her back to GI for evaluation as she would like to defer at this time.

## 2011-12-31 NOTE — Assessment & Plan Note (Signed)
Continue use of decongestants Flonase and saline irrigation. Anicteric. Adding Benadryl for drainage and azithromycin for serous effusion of left ear.

## 2012-01-01 ENCOUNTER — Telehealth: Payer: Self-pay | Admitting: Internal Medicine

## 2012-01-01 NOTE — Telephone Encounter (Signed)
Pt called checking on her mammogram appointment  She would like to go to [place]

## 2012-01-07 ENCOUNTER — Ambulatory Visit: Payer: BC Managed Care – PPO

## 2012-01-07 ENCOUNTER — Ambulatory Visit (INDEPENDENT_AMBULATORY_CARE_PROVIDER_SITE_OTHER): Payer: BC Managed Care – PPO | Admitting: Internal Medicine

## 2012-01-07 DIAGNOSIS — Z23 Encounter for immunization: Secondary | ICD-10-CM

## 2012-01-12 NOTE — Progress Notes (Signed)
Patient ID: Suzanne Spears, female   DOB: [birth year], 51 y.o.   MRN: [id number] Here for influenza vaccine

## 2012-02-04 ENCOUNTER — Other Ambulatory Visit: Payer: Self-pay | Admitting: Pulmonary Disease

## 2012-02-04 MED ORDER — ALBUTEROL SULFATE HFA 108 (90 BASE) MCG/ACT IN AERS: 2.0000 | INHALATION_SPRAY | Freq: Four times a day (QID) | RESPIRATORY_TRACT | Status: DC | PRN

## 2012-02-12 ENCOUNTER — Telehealth: Payer: Self-pay | Admitting: Pulmonary Disease

## 2012-02-12 NOTE — Telephone Encounter (Signed)
Sorry, msg was closed in error and will forward back to Dr. Kendrick Fries

## 2012-02-12 NOTE — Telephone Encounter (Signed)
Dr. Kendrick Fries, is it okay with you to prescribe spacer for pt to use with ventolin?  Please advise, thanks!

## 2012-02-12 NOTE — Telephone Encounter (Signed)
Absolutely.  I'm always OK with that.

## 2012-02-13 NOTE — Telephone Encounter (Signed)
Spoke with pharmacy and gave the okay to provide spacer  Per pharmacist, nothing further needed

## 2012-03-30 ENCOUNTER — Ambulatory Visit (INDEPENDENT_AMBULATORY_CARE_PROVIDER_SITE_OTHER): Payer: BC Managed Care – PPO | Admitting: Internal Medicine

## 2012-03-30 ENCOUNTER — Encounter: Payer: Self-pay | Admitting: Internal Medicine

## 2012-03-30 VITALS — BP 120/76 | HR 76 | Temp 97.5°F | Resp 16 | Wt 149.2 lb

## 2012-03-30 DIAGNOSIS — I428 Other cardiomyopathies: Secondary | ICD-10-CM

## 2012-03-30 DIAGNOSIS — B9789 Other viral agents as the cause of diseases classified elsewhere: Secondary | ICD-10-CM

## 2012-03-30 DIAGNOSIS — J111 Influenza due to unidentified influenza virus with other respiratory manifestations: Secondary | ICD-10-CM

## 2012-03-30 DIAGNOSIS — I420 Dilated cardiomyopathy: Secondary | ICD-10-CM

## 2012-03-30 DIAGNOSIS — B349 Viral infection, unspecified: Secondary | ICD-10-CM | POA: Insufficient documentation

## 2012-03-30 DIAGNOSIS — R102 Pelvic and perineal pain: Secondary | ICD-10-CM

## 2012-03-30 DIAGNOSIS — R109 Unspecified abdominal pain: Secondary | ICD-10-CM

## 2012-03-30 LAB — POCT URINALYSIS DIPSTICK
Bilirubin, UA: NEGATIVE
Blood, UA: NEGATIVE
Glucose, UA: NEGATIVE
Ketones, UA: NEGATIVE
Leukocytes, UA: NEGATIVE
Nitrite, UA: NEGATIVE
Protein, UA: NEGATIVE
Spec Grav, UA: 1.015
Urobilinogen, UA: 0.2
pH, UA: 6.5

## 2012-03-30 LAB — POCT INFLUENZA A/B
Influenza A, POC: NEGATIVE
Influenza B, POC: NEGATIVE

## 2012-03-30 MED ORDER — PROMETHAZINE HCL 12.5 MG PO TABS: 12.5000 mg | ORAL_TABLET | Freq: Three times a day (TID) | ORAL | Status: DC | PRN

## 2012-03-30 MED ORDER — HYDROCODONE-ACETAMINOPHEN 5-325 MG PO TABS: 1.0000 | ORAL_TABLET | Freq: Four times a day (QID) | ORAL | Status: DC | PRN

## 2012-03-30 NOTE — Assessment & Plan Note (Signed)
Symptoms have been mild,  No fever, vomiting or diarrhea,  influenza test and UA were negative,  Recommended rest and fluids.
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
## 2012-03-30 NOTE — Patient Instructions (Addendum)
Your flu test was negative,  But you do appear to be suffering from a viral infection  You can use motrin 400 mg and tramadol or vicodin for your muscle and back pain  Drink plenty of fluids and rest!!  No gym until you are back to your usual self.   We will call you if the urine or flu test is positive.

## 2012-04-01 ENCOUNTER — Telehealth: Payer: Self-pay | Admitting: Internal Medicine

## 2012-04-01 NOTE — Telephone Encounter (Signed)
Pt calling asking for results of urine test on Monday.  Pt still not feeling well.

## 2012-04-01 NOTE — Telephone Encounter (Signed)
urine test was normal.  If she needs a note for work ,  Please write one and I will sign

## 2012-04-01 NOTE — Telephone Encounter (Signed)
Pt notified states she will be ok. If still feeling the same will call tomorrow in regards to the note.

## 2012-04-01 NOTE — Telephone Encounter (Signed)
Pt called back stating that lower back is still hurting and still feels "puny". Feels better than she did on Monday but still has fatigue and tiredness. Pt was written out of work until today did not go in because she is not feeling like she could make it through the day.

## 2012-04-07 ENCOUNTER — Ambulatory Visit: Payer: Self-pay | Admitting: Internal Medicine

## 2012-04-07 LAB — HM MAMMOGRAPHY

## 2012-04-20 ENCOUNTER — Encounter: Payer: Self-pay | Admitting: Adult Health

## 2012-04-20 ENCOUNTER — Other Ambulatory Visit: Payer: Self-pay | Admitting: Adult Health

## 2012-04-20 ENCOUNTER — Ambulatory Visit (INDEPENDENT_AMBULATORY_CARE_PROVIDER_SITE_OTHER): Payer: BC Managed Care – PPO | Admitting: Adult Health

## 2012-04-20 ENCOUNTER — Ambulatory Visit: Payer: Self-pay

## 2012-04-20 VITALS — BP 124/72 | HR 70 | Temp 98.2°F | Resp 14 | Wt 147.0 lb

## 2012-04-20 DIAGNOSIS — R5381 Other malaise: Secondary | ICD-10-CM

## 2012-04-20 DIAGNOSIS — R5383 Other fatigue: Secondary | ICD-10-CM

## 2012-04-20 DIAGNOSIS — R109 Unspecified abdominal pain: Secondary | ICD-10-CM

## 2012-04-20 DIAGNOSIS — E538 Deficiency of other specified B group vitamins: Secondary | ICD-10-CM | POA: Insufficient documentation

## 2012-04-20 DIAGNOSIS — R197 Diarrhea, unspecified: Secondary | ICD-10-CM

## 2012-04-20 LAB — CBC WITH DIFFERENTIAL/PLATELET
Basophils Absolute: 0 10*3/uL (ref 0.0–0.1)
Basophils Relative: 0.5 % (ref 0.0–3.0)
Eosinophils Absolute: 0.1 10*3/uL (ref 0.0–0.7)
Eosinophils Relative: 1.1 % (ref 0.0–5.0)
HCT: 40.9 % (ref 36.0–46.0)
Hemoglobin: 13.7 g/dL (ref 12.0–15.0)
Lymphocytes Relative: 31.6 % (ref 12.0–46.0)
Lymphs Abs: 1.6 10*3/uL (ref 0.7–4.0)
MCHC: 33.6 g/dL (ref 30.0–36.0)
MCV: 93 fl (ref 78.0–100.0)
Monocytes Absolute: 0.3 10*3/uL (ref 0.1–1.0)
Monocytes Relative: 6.6 % (ref 3.0–12.0)
Neutro Abs: 3 10*3/uL (ref 1.4–7.7)
Neutrophils Relative %: 60.2 % (ref 43.0–77.0)
Platelets: 263 10*3/uL (ref 150.0–400.0)
RBC: 4.4 Mil/uL (ref 3.87–5.11)
RDW: 13.7 % (ref 11.5–14.6)
WBC: 4.9 10*3/uL (ref 4.5–10.5)

## 2012-04-20 LAB — COMPREHENSIVE METABOLIC PANEL
ALT: 13 U/L (ref 0–35)
AST: 17 U/L (ref 0–37)
Albumin: 3.7 g/dL (ref 3.5–5.2)
Alkaline Phosphatase: 59 U/L (ref 39–117)
BUN: 15 mg/dL (ref 6–23)
CO2: 20 mEq/L (ref 19–32)
Calcium: 8.9 mg/dL (ref 8.4–10.5)
Chloride: 113 mEq/L — ABNORMAL HIGH (ref 96–112)
Creatinine, Ser: 1 mg/dL (ref 0.4–1.2)
GFR: 64.38 mL/min (ref >60.00)
Glucose, Bld: 89 mg/dL (ref 70–99)
Potassium: 4 mEq/L (ref 3.5–5.1)
Sodium: 141 mEq/L (ref 135–145)
Total Bilirubin: 0.5 mg/dL (ref 0.3–1.2)
Total Protein: 7 g/dL (ref 6.0–8.3)

## 2012-04-20 LAB — SEDIMENTATION RATE: Sed Rate: 13 mm/hr (ref 0–22)

## 2012-04-20 LAB — TSH: TSH: 1.34 u[IU]/mL (ref 0.35–5.50)

## 2012-04-20 LAB — VITAMIN B12: Vitamin B-12: 160 pg/mL — ABNORMAL LOW (ref 211–911)

## 2012-04-20 MED ORDER — CYANOCOBALAMIN 1000 MCG/ML IJ SOLN: INTRAMUSCULAR | Status: DC

## 2012-04-20 NOTE — Patient Instructions (Addendum)
  Ultrasound of abdomen at [facility] today.  Labs prior to leaving office today.

## 2012-04-20 NOTE — Assessment & Plan Note (Addendum)
Reports this is ongoing since the last time she had a viral gastroenteritis. Will check TSH to r/o other origin for fatigue. Will also check B12.   

## 2012-04-20 NOTE — Assessment & Plan Note (Signed)
Start replacement. First week administer 3 injections; Second week thru Fourth week administer 1 injection weekly; Then 1 injection monthly.

## 2012-04-20 NOTE — Progress Notes (Signed)
  Subjective:    Patient ID: Suzanne Spears, female    DOB: [birth year], 52 y.o.   MRN: [id number]  HPI  Patient is a 52 year old female who presents to clinic this morning with complaints of diarrhea, hyperactive bowels and feelings of griping. She reports that the symptoms began this past Sunday. Patient reports having only one episode of diarrhea on Sunday. She is feeling very drained and fatigued. She feels her stomach is bloating. She is experiencing night sweats which she is attributing to perimenopausal state. Patient also reports having episode of diarrhea this morning. She denies sick contacts at home. She reports that there are several people who have been sick at work. Patient had a normal colonoscopy approximately 2 years ago.   Review of Systems  Constitutional: Positive for fatigue. Negative for fever.  HENT: Negative for congestion, sore throat, rhinorrhea, postnasal drip and sinus pressure.   Gastrointestinal: Positive for diarrhea, constipation and abdominal distention. Negative for nausea, abdominal pain and blood in stool.  Genitourinary: Negative for dysuria.  Neurological: Positive for headaches. Negative for dizziness and light-headedness.  Psychiatric/Behavioral: The patient is nervous/anxious.    BP 124/72  Pulse 70  Temp(Src) 98.2 F (36.8 C) (Oral)  Resp 14  Wt 147 lb (66.679 kg)  BMI 23.74 kg/m2  SpO2 98%  LMP 03/23/2012     Objective:   Physical Exam  Constitutional: She is oriented to person, place, and time. She appears well-developed and well-nourished. No distress.  HENT:  Head: Normocephalic and atraumatic.  Cardiovascular: Normal rate, regular rhythm and normal heart sounds.   Pulmonary/Chest: Effort normal and breath sounds normal. She has no wheezes. She has no rales.  Abdominal: Soft. She exhibits no distension and no mass. There is tenderness. There is no rebound and no guarding.  Neurological: She is alert and oriented to person, place,  and time.  Skin: Skin is warm and dry.  Psychiatric:  Increased anxiety. She is concerned about not feeling well and is exhibiting nervous behavior. Repetitive speech. Repeats symptoms multiple times.       Assessment & Plan:

## 2012-04-20 NOTE — Progress Notes (Signed)
Spoke with Patient and discussed B12 def and need for replacement.

## 2012-04-20 NOTE — Assessment & Plan Note (Signed)
Tenderness upon palpating RUQ, epigastric area and LUQ. Will check cbc, sed rate, cmet. Send for ultrasound of abdomen.

## 2012-04-21 ENCOUNTER — Telehealth: Payer: Self-pay | Admitting: Internal Medicine

## 2012-04-21 ENCOUNTER — Emergency Department: Payer: Self-pay | Admitting: Emergency Medicine

## 2012-04-21 LAB — URINALYSIS, COMPLETE
Bacteria: NONE SEEN
Bilirubin,UR: NEGATIVE
Glucose,UR: NEGATIVE mg/dL (ref 0–75)
Ketone: NEGATIVE
Nitrite: NEGATIVE
Ph: 5 (ref 4.5–8.0)
Protein: NEGATIVE
RBC,UR: 2 /HPF (ref 0–5)
Specific Gravity: 1.023 (ref 1.003–1.030)
Squamous Epithelial: <1
WBC UR: 2 /HPF (ref 0–5)

## 2012-04-21 LAB — COMPREHENSIVE METABOLIC PANEL
Albumin: 3.6 g/dL (ref 3.4–5.0)
Alkaline Phosphatase: 73 U/L (ref 50–136)
Anion Gap: 7 (ref 7–16)
BUN: 17 mg/dL (ref 7–18)
Bilirubin,Total: 0.3 mg/dL (ref 0.2–1.0)
Calcium, Total: 8.4 mg/dL — ABNORMAL LOW (ref 8.5–10.1)
Chloride: 111 mmol/L — ABNORMAL HIGH (ref 98–107)
Co2: 21 mmol/L (ref 21–32)
Creatinine: 1.13 mg/dL (ref 0.60–1.30)
EGFR (African American): >60
EGFR (Non-African Amer.): 57 — ABNORMAL LOW
Glucose: 92 mg/dL (ref 65–99)
Osmolality: 279 (ref 275–301)
Potassium: 3.7 mmol/L (ref 3.5–5.1)
SGOT(AST): 22 U/L (ref 15–37)
SGPT (ALT): 16 U/L (ref 12–78)
Sodium: 139 mmol/L (ref 136–145)
Total Protein: 7.1 g/dL (ref 6.4–8.2)

## 2012-04-21 LAB — CBC
HCT: 40.4 % (ref 35.0–47.0)
HGB: 13.4 g/dL (ref 12.0–16.0)
MCH: 31.3 pg (ref 26.0–34.0)
MCHC: 33.1 g/dL (ref 32.0–36.0)
MCV: 95 fL (ref 80–100)
Platelet: 250 10*3/uL (ref 150–440)
RBC: 4.27 10*6/uL (ref 3.80–5.20)
RDW: 13.5 % (ref 11.5–14.5)
WBC: 5.1 10*3/uL (ref 3.6–11.0)

## 2012-04-21 LAB — TROPONIN I: Troponin-I: <0.02 ng/mL

## 2012-04-21 LAB — PREGNANCY, URINE: Pregnancy Test, Urine: NEGATIVE m[IU]/mL

## 2012-04-21 LAB — CK TOTAL AND CKMB (NOT AT ARMC)
CK, Total: 67 U/L (ref 21–215)
CK-MB: <0.5 ng/mL — ABNORMAL LOW (ref 0.5–3.6)

## 2012-04-21 NOTE — Telephone Encounter (Signed)
Returned call to patient concerning dizziness/nausea. No answer. Message left to cal back.

## 2012-04-22 ENCOUNTER — Telehealth: Payer: Self-pay | Admitting: Internal Medicine

## 2012-04-22 ENCOUNTER — Ambulatory Visit (INDEPENDENT_AMBULATORY_CARE_PROVIDER_SITE_OTHER): Payer: BC Managed Care – PPO | Admitting: *Deleted

## 2012-04-22 DIAGNOSIS — E538 Deficiency of other specified B group vitamins: Secondary | ICD-10-CM

## 2012-04-22 MED ORDER — CYANOCOBALAMIN 1000 MCG/ML IJ SOLN
1000.0000 ug | Freq: Once | INTRAMUSCULAR | Status: AC
  Administered 2012-04-22: 1000 ug via INTRAMUSCULAR

## 2012-04-22 NOTE — Telephone Encounter (Signed)
Pt was told to come back for 3 rounds of B-12 and she is needing to come back for the second one next week but the nurses are saying we don't have any left ???

## 2012-04-22 NOTE — Telephone Encounter (Signed)
B12 is on back order. Don't know when it will get here. Make sure patient is on the list to be called once we get them. Thanks, Terria Deschepper

## 2012-04-23 NOTE — Telephone Encounter (Signed)
Patient put on list for B12

## 2012-04-27 ENCOUNTER — Telehealth: Payer: Self-pay | Admitting: Internal Medicine

## 2012-04-27 NOTE — Telephone Encounter (Signed)
I spoke with patient this evening. I explained that her preliminary report and the final were the same.  I also told her that the B12 was on back order. I apologized for the misunderstanding. The order of how I wanted her to get replaced was in her medication list. Patient stated she felt that the nurse did not know what she was taking about. I also explained that the way I replace a patient's B12 may not be the same way another provider replaces it. But, again, I had entered the information about how I wanted it done in her medication list. She appreciated the call back.

## 2012-04-27 NOTE — Telephone Encounter (Signed)
Patient called in very upset. She feels as if we have dropped the ball in our office.  She stated that she came in last week and was told that she needed B-12 injections. She stated that she was suppose to have 3 in one week. She has only had one B-12 shot and it was last Thursday. I advised patient that we didn't have any B-12 injections she would like to know if she can get B-12 to give herself.  She also states that she hasn't gotten a final result on her Ultra Sound that she had last week  at [facility] and she hasn't heard anything about what was going on with her last Wednesday when she was sent to the ER by our triage nurse. I have sent a request for the result of the [name] and her last visit to the ER.  How is this patient supposed to be set up for her injections since it is our responsibility to set up all of her appointment.  If we aren't calling in a prescription for B12 please advise how we need to schedule them. Please call her at [phone] she goes by Richrd Prime.

## 2012-04-28 ENCOUNTER — Other Ambulatory Visit: Payer: Self-pay | Admitting: Pulmonary Disease

## 2012-04-28 ENCOUNTER — Encounter: Payer: Self-pay | Admitting: Internal Medicine

## 2012-04-28 MED ORDER — MONTELUKAST SODIUM 10 MG PO TABS: 10.0000 mg | ORAL_TABLET | Freq: Every day | ORAL | Status: DC

## 2012-05-04 ENCOUNTER — Encounter: Payer: Self-pay | Admitting: Internal Medicine

## 2012-05-04 ENCOUNTER — Other Ambulatory Visit (HOSPITAL_COMMUNITY)
Admission: RE | Admit: 2012-05-04 | Discharge: 2012-05-04 | Disposition: A | Discharge status: Home / Self Care | Payer: BC Managed Care – PPO | Source: Ambulatory Visit | Attending: Internal Medicine | Admitting: Internal Medicine

## 2012-05-04 ENCOUNTER — Ambulatory Visit (INDEPENDENT_AMBULATORY_CARE_PROVIDER_SITE_OTHER): Payer: BC Managed Care – PPO | Admitting: Internal Medicine

## 2012-05-04 VITALS — BP 118/74 | HR 89 | Temp 99.4°F | Ht 65.5 in | Wt 147.0 lb

## 2012-05-04 DIAGNOSIS — Z1151 Encounter for screening for human papillomavirus (HPV): Secondary | ICD-10-CM | POA: Insufficient documentation

## 2012-05-04 DIAGNOSIS — R5381 Other malaise: Secondary | ICD-10-CM

## 2012-05-04 DIAGNOSIS — I059 Rheumatic mitral valve disease, unspecified: Secondary | ICD-10-CM

## 2012-05-04 DIAGNOSIS — N941 Unspecified dyspareunia: Secondary | ICD-10-CM

## 2012-05-04 DIAGNOSIS — I341 Nonrheumatic mitral (valve) prolapse: Secondary | ICD-10-CM

## 2012-05-04 DIAGNOSIS — Z01419 Encounter for gynecological examination (general) (routine) without abnormal findings: Secondary | ICD-10-CM | POA: Insufficient documentation

## 2012-05-04 DIAGNOSIS — Z0000 Encounter for general adult medical examination without abnormal findings: Secondary | ICD-10-CM

## 2012-05-04 DIAGNOSIS — R42 Dizziness and giddiness: Secondary | ICD-10-CM

## 2012-05-04 DIAGNOSIS — R5383 Other fatigue: Secondary | ICD-10-CM

## 2012-05-04 DIAGNOSIS — Z124 Encounter for screening for malignant neoplasm of cervix: Secondary | ICD-10-CM

## 2012-05-04 DIAGNOSIS — IMO0002 Reserved for concepts with insufficient information to code with codable children: Secondary | ICD-10-CM

## 2012-05-04 MED ORDER — CYANOCOBALAMIN 1000 MCG/ML IJ SOLN: 1000.0000 ug | Freq: Once | INTRAMUSCULAR | Status: DC

## 2012-05-04 NOTE — Patient Instructions (Addendum)
Begin Afrin nasal spray twice daily after  Saline flushes  For 5 days,  And , continue saline flushes Daily thereafter   If no improvement in the way your ear feels  Call Dr Jenne Campus   Try flexeril for the neck tension

## 2012-05-04 NOTE — Progress Notes (Signed)
Patient ID: Suzanne Spears, female   DOB: [birth year], 52 y.o.   MRN: [id number]   Subjective:     Suzanne Spears is a 52 y.o. female and is here for a comprehensive physical exam. The patient reports multiple complaints,  all od which were adressed in a 60 minute visit. Marland Kitchen    2 weeks ago saw raquel for "issues with her stomach",  RUQ Was tender and didn't feel well.  Was sent for an ultrasound of gallbladder which was normal .  B12 was low  So she received one injection and was todl she needed several the first week but was not scheduled bc of shortage of B12 injectable.,  Communication issues in the office caused patietn to become very frustrated and worried,  Multiple calls to clarify issue.  Still has not has more than one B12 supplement,  No orals taken.  Wants to have her sister give her the injections when available.  .  Er visit on 2/26 ,  One day later for continued nausea and dizziness.  Head CT was normal .  Was told her BP was elevated at 154 which alarmed her very much.  And she was told she had  otitis with an effusion  Of Left ear but was not treated.  Very dissatisfied at the care the scond ER physician gave her and complained to administration about her. .  Still doesn't feel good. Still having "episodes" Exhaustion.   Sits in chair after work and falls asleep.  No energy .    Dysparuenia,  Doesn't have intercourse often but recently tried with husband and penetration was very painful.  o bleeding or discharge. Perimenopausal.  .  History   Social History  . Marital Status: Married    Spouse Name: N/A    Number of Children: N/A  . Years of Education: N/A   Occupational History  . Not on file.   Social History Main Topics  . Smoking status: Never Smoker   . Smokeless tobacco: Never Used  . Alcohol Use: No  . Drug Use: Not on file  . Sexually Active: Not on file   Other Topics Concern  . Not on file   Social History Narrative  . No narrative on file    Health Maintenance  Topic Date Due  . Tetanus/tdap  05/28/1980  . Influenza Vaccine  10/25/2012  . Pap Smear  04/24/2013  . Mammogram  04/07/2014  . Colonoscopy  12/19/2020    The following portions of the patient's history were reviewed and updated as appropriate: allergies, current medications, past family history, past medical history, past social history, past surgical history and problem list.  Review of Systems A comprehensive review of systems was negative except for: Constitutional: positive for fatigue   Objective:   General Appearance:    Alert, cooperative, no distress, appears stated age  Head:    Normocephalic, without obvious abnormality, atraumatic  Eyes:    PERRL, conjunctiva/corneas clear, EOM's intact, fundi    benign, both eyes  Ears:    Normal TM's and external ear canals, both ears  Nose:   Nares normal, septum midline, mucosa normal, no drainage    or sinus tenderness  Throat:   Lips, mucosa, and tongue normal; teeth and gums normal  Neck:   Supple, symmetrical, trachea midline, no adenopathy;    thyroid:  no enlargement/tenderness/nodules; no carotid   bruit or JVD  Back:     Symmetric, no curvature, ROM normal, no CVA  tenderness  Lungs:     Clear to auscultation bilaterally, respirations unlabored  Chest Wall:    No tenderness or deformity   Heart:    Regular rate and rhythm, S1 and S2 normal, no murmur, rub   or gallop  Breast Exam:    Deferred   Abdomen:     Soft, non-tender, bowel sounds active all four quadrants,    no masses, no organomegaly  Genitalia:    Pelvic: cervix normal in appearance, external genitalia normal, no adnexal masses or tenderness, no cervical motion tenderness, rectovaginal septum normal, uterus normal size, shape, and consistency and vagina normal without discharge  Extremities:   Extremities normal, atraumatic, no cyanosis or edema  Pulses:   2+ and symmetric all extremities  Skin:   Skin color, texture, turgor normal, no  rashes or lesions  Lymph nodes:   Cervical, supraclavicular, and axillary nodes normal  Neurologic:   CNII-XII intact, normal strength, sensation and reflexes    throughout     Assessment:   Other malaise and fatigue Multifactorial,  With B12 deficiency found on recent testing.  IM and oral supplements recommended, and rx sent to pharmacy for home use  Mitral valve prolapse syndrome She has frequent office visits for workup of symptoms that usually resolve without a definitive diagnosis. Reassurance provided.    Routine general medical examination at a health care facility Annual comprehensive exam was done including review of mammogram, pelvic and PAP smear. All screenings are up to date, and she will return for breast exam in one month .   Dyspareunia, female Exam notable for narrow vault and vaginal atrophy.  otherwsie normal,  Topical estrogen discussed with patient,  She deferred for the present time but prefers to wait until husband retires soon.  Dizziness and giddiness Left ear still problematic but not infected.,  recommended trial of Afrin,  Saline irrigation of sinuses,, followed by ENT eval if no improvement.Lyman Bishop PE to be avoided given MVP and cardiomyopathy.  A total of 60 minutes was spent with patient today addressing subacute complaints and need for annual PE.   Updated Medication List Outpatient Encounter Prescriptions as of 05/04/2012  Medication Sig Dispense Refill  . albuterol (VENTOLIN HFA) 108 (90 BASE) MCG/ACT inhaler Inhale 2 puffs into the lungs every 6 (six) hours as needed.  1 Inhaler  1  . cyanocobalamin (,VITAMIN B-12,) 1000 MCG/ML injection Administer IM as follows: Week #1 (3 injections); Weeks #2-4 (1 injection weekly); Weeks # 5 and on (1 injections monthly).  1 mL  0  . cyclobenzaprine (FLEXERIL) 10 MG tablet Take 10 mg by mouth daily.       . DULoxetine (CYMBALTA) 60 MG capsule Take 60 mg by mouth daily.       . fexofenadine (ALLEGRA) 180 MG  tablet Take 180 mg by mouth at bedtime.      . fluticasone (FLONASE) 50 MCG/ACT nasal spray Place 2 sprays into the nose at bedtime. 2 sprays each nostril at bedtime  16 g  3  . GILDESS FE 1/20 1-20 MG-MCG tablet Take 1 tablet by mouth daily.       Marland Kitchen HYDROcodone-acetaminophen (NORCO/VICODIN) 5-325 MG per tablet Take 1 tablet by mouth every 6 (six) hours as needed for pain.  30 tablet  3  . montelukast (SINGULAIR) 10 MG tablet Take 1 tablet (10 mg total) by mouth at bedtime.  30 tablet  3  . omeprazole (PRILOSEC) 10 MG capsule Take one by mouth daily as  needed.      . promethazine (PHENERGAN) 12.5 MG tablet Take 1 tablet (12.5 mg total) by mouth every 8 (eight) hours as needed for nausea.  20 tablet  0  . topiramate (TOPAMAX) 50 MG tablet Take one by mouth in the morning, three at night      . traMADol (ULTRAM) 50 MG tablet Take 50 mg by mouth every 8 (eight) hours as needed.      . DULoxetine (CYMBALTA) 60 MG capsule Take 1 capsule (60 mg total) by mouth daily.  60 capsule  11  . norethindrone-ethinyl estradiol (GILDESS 1/20) 1-20 MG-MCG tablet Take 1 tablet by mouth daily.  1 Package  11  . [DISCONTINUED] cyanocobalamin (,VITAMIN B-12,) 1000 MCG/ML injection Inject 1 mL (1,000 mcg total) into the muscle once.  1 mL  0   No facility-administered encounter medications on file as of 05/04/2012.

## 2012-05-05 DIAGNOSIS — N941 Unspecified dyspareunia: Secondary | ICD-10-CM | POA: Insufficient documentation

## 2012-05-05 DIAGNOSIS — Z0000 Encounter for general adult medical examination without abnormal findings: Secondary | ICD-10-CM | POA: Insufficient documentation

## 2012-05-05 DIAGNOSIS — R42 Dizziness and giddiness: Secondary | ICD-10-CM | POA: Insufficient documentation

## 2012-05-05 NOTE — Assessment & Plan Note (Signed)
She has frequent office visits for workup of symptoms that usually resolve without a definitive diagnosis. Reassurance provided.

## 2012-05-05 NOTE — Assessment & Plan Note (Signed)
Exam notable for narrow vault and vaginal atrophy.  otherwsie normal,  Topical estrogen discussed with patient,  She deferred for the present time but prefers to wait until husband retires soon.

## 2012-05-05 NOTE — Assessment & Plan Note (Signed)
Multifactorial,  With B12 deficiency found on recent testing.  IM and oral supplements recommended, and rx sent to pharmacy for home use

## 2012-05-05 NOTE — Assessment & Plan Note (Addendum)
Left ear still problematic but not infected.,  recommended trial of Afrin,  Saline irrigation of sinuses,, followed by ENT eval if no improvement.Lyman Bishop PE to be avoided given MVP and cardiomyopathy.

## 2012-05-05 NOTE — Assessment & Plan Note (Signed)
Annual comprehensive exam was done including review of mammogram, pelvic and PAP smear. All screenings are up to date, and she will return for breast exam in one month .

## 2012-05-11 ENCOUNTER — Other Ambulatory Visit: Payer: Self-pay | Admitting: *Deleted

## 2012-05-11 ENCOUNTER — Telehealth: Payer: Self-pay | Admitting: *Deleted

## 2012-05-11 NOTE — Telephone Encounter (Signed)
error 

## 2012-05-11 NOTE — Telephone Encounter (Deleted)
Suzanne Spears

## 2012-05-11 NOTE — Telephone Encounter (Signed)
We now have some B12 injections does patient need to be schedule and if so how many times.

## 2012-05-11 NOTE — Telephone Encounter (Signed)
I spoke with the patient and verified that her script for her B12 injection  was called in to her pharmacist at [facility]. Called in per Dr. Darrick Huntsman and patient will be picking it up soon.

## 2012-05-13 ENCOUNTER — Other Ambulatory Visit: Payer: Self-pay | Admitting: Adult Health

## 2012-05-13 ENCOUNTER — Encounter: Payer: Self-pay | Admitting: Adult Health

## 2012-05-13 ENCOUNTER — Telehealth: Payer: Self-pay | Admitting: General Practice

## 2012-05-13 MED ORDER — CYANOCOBALAMIN 1000 MCG/ML IJ SOLN: INTRAMUSCULAR | Status: DC

## 2012-05-13 NOTE — Telephone Encounter (Signed)
Pt is suppose to have 6 injections within 5 weeks. The Rx that was sent in has no refills. The pharmacy has to order these injections and it is taking about 2 weeks for B12 to come in. Is there anything we can do to help this Pt. She is due 3 shots in the first week. Please advise.

## 2012-05-13 NOTE — Telephone Encounter (Signed)
Pt notified of the instructions.

## 2012-05-13 NOTE — Telephone Encounter (Signed)
I have sent in a prescription for the multi-dose vial. I intended to do this but didn't hit the right button. Please tell her to go ahead and have her sister-in-law administer the dose she has and when she gets the other vial to do 2 doses the first week instead of 3 and then proceed with the remaining instructions I had given her.

## 2012-05-17 ENCOUNTER — Other Ambulatory Visit: Payer: Self-pay | Admitting: *Deleted

## 2012-05-21 ENCOUNTER — Encounter: Payer: Self-pay | Admitting: Internal Medicine

## 2012-05-24 ENCOUNTER — Other Ambulatory Visit: Payer: Self-pay | Admitting: General Practice

## 2012-05-24 MED ORDER — CYANOCOBALAMIN 1000 MCG/ML IJ SOLN: INTRAMUSCULAR | Status: DC

## 2012-06-08 ENCOUNTER — Ambulatory Visit: Payer: BC Managed Care – PPO | Admitting: Internal Medicine

## 2012-06-28 ENCOUNTER — Other Ambulatory Visit: Payer: Self-pay | Admitting: *Deleted

## 2012-06-28 MED ORDER — DULOXETINE HCL 60 MG PO CPEP: 60.0000 mg | ORAL_CAPSULE | Freq: Every day | ORAL | Status: DC

## 2012-06-28 MED ORDER — NORETHIN ACE-ETH ESTRAD-FE 1-20 MG-MCG PO TABS: 1.0000 | ORAL_TABLET | Freq: Every day | ORAL | Status: DC

## 2012-06-28 NOTE — Telephone Encounter (Signed)
Ok to continue refilling oral contraceptives?

## 2012-08-03 ENCOUNTER — Encounter: Payer: Self-pay | Admitting: Pulmonary Disease

## 2012-08-03 ENCOUNTER — Ambulatory Visit (INDEPENDENT_AMBULATORY_CARE_PROVIDER_SITE_OTHER): Payer: BC Managed Care – PPO | Admitting: Pulmonary Disease

## 2012-08-03 VITALS — BP 110/72 | HR 86 | Temp 98.7°F | Ht 65.5 in | Wt 159.0 lb

## 2012-08-03 DIAGNOSIS — R5383 Other fatigue: Secondary | ICD-10-CM

## 2012-08-03 DIAGNOSIS — J4530 Mild persistent asthma, uncomplicated: Secondary | ICD-10-CM

## 2012-08-03 DIAGNOSIS — J45909 Unspecified asthma, uncomplicated: Secondary | ICD-10-CM

## 2012-08-03 DIAGNOSIS — J329 Chronic sinusitis, unspecified: Secondary | ICD-10-CM

## 2012-08-03 DIAGNOSIS — I509 Heart failure, unspecified: Secondary | ICD-10-CM

## 2012-08-03 DIAGNOSIS — R5381 Other malaise: Secondary | ICD-10-CM

## 2012-08-03 DIAGNOSIS — I5042 Chronic combined systolic (congestive) and diastolic (congestive) heart failure: Secondary | ICD-10-CM

## 2012-08-03 MED ORDER — FLUTICASONE PROPIONATE 50 MCG/ACT NA SUSP: 2.0000 | Freq: Every day | NASAL | Status: DC

## 2012-08-03 NOTE — Assessment & Plan Note (Addendum)
I recommended that she have a sleep study based on her symptoms of daytime somnolence, snoring, the dreams, and ongoing fatigue. I also think she really needs to start exercising more regularly.  Plan:  -polysomnogram ordered -Pulmonary rehabilitation referral with diagnosis of CHF

## 2012-08-03 NOTE — Patient Instructions (Signed)
Take the aerospan one puff twice a day for a month and then call us to let us know if you feel better after taking it (it may take several weeks for it to work)  Keep taking your allergy/sinus meds as you are doing  Start pulmonary rehab  We will send you for a sleep study  We will see you back in 2-3 months or sooner if needed

## 2012-08-03 NOTE — Assessment & Plan Note (Addendum)
I am still not convinced that Suzanne Spears's asthma is causing considerable symptoms such as her shortness of breath. She has only used her albuterol twice in last 8 months. However, considering the fact that she does have mild airflow obstruction on PFTs and she has this ongoing unexplained fatigue and shortness breath with exertion I think it is reasonable for her to have a trial of inhaled corticosteroid.  Plan: -Inhaled flunisolide for one month, follow up with me afterwards with repeated simple spirometry
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
## 2012-08-04 ENCOUNTER — Telehealth: Payer: Self-pay | Admitting: Pulmonary Disease

## 2012-08-05 NOTE — Telephone Encounter (Signed)
Referral and form from [facility] in [place]  filled out and faxed.  Informed patient they will contact her in ref to appt date and time.  (pt has been there before). Location is more convenient for her for sleep study. Kandice Hams

## 2012-08-09 ENCOUNTER — Ambulatory Visit: Payer: Self-pay | Admitting: Neurology

## 2012-08-23 ENCOUNTER — Encounter: Payer: Self-pay | Admitting: Internal Medicine

## 2012-08-29 ENCOUNTER — Encounter (HOSPITAL_BASED_OUTPATIENT_CLINIC_OR_DEPARTMENT_OTHER): Payer: BC Managed Care – PPO

## 2012-09-03 ENCOUNTER — Ambulatory Visit: Payer: Self-pay | Admitting: Pulmonary Disease

## 2012-09-07 ENCOUNTER — Ambulatory Visit: Payer: BC Managed Care – PPO | Admitting: Neurology

## 2012-09-13 ENCOUNTER — Encounter: Payer: Self-pay | Admitting: Internal Medicine

## 2012-09-14 ENCOUNTER — Encounter: Payer: Self-pay | Admitting: Internal Medicine

## 2012-09-14 DIAGNOSIS — M47812 Spondylosis without myelopathy or radiculopathy, cervical region: Secondary | ICD-10-CM | POA: Insufficient documentation

## 2012-09-14 MED ORDER — CYANOCOBALAMIN 1000 MCG/ML IJ SOLN: INTRAMUSCULAR | Status: DC

## 2012-09-15 ENCOUNTER — Telehealth: Payer: Self-pay | Admitting: Pulmonary Disease

## 2012-09-15 NOTE — Telephone Encounter (Signed)
Spoke with patient-- Patient had sleep study done July 11 @ [facility] Patient requesting results of this I have requested study from [facility]), Dr. Kendrick Fries have you received this?

## 2012-09-16 NOTE — Telephone Encounter (Signed)
Correction for the note- 0-5 is WNL

## 2012-09-16 NOTE — Telephone Encounter (Signed)
Per CDY: occasional resp events with sleep disturbance-within normal limits Score AHI was 4/hr Anything 5-6/hr is considered normal  I spoke with patient about results and she verbalized understanding and had no questions. This study has been placed in [facility] "look at". Will forward to Dr. Kendrick Fries so he is aware.

## 2012-09-16 NOTE — Telephone Encounter (Signed)
Pt calling again in ref to previous msg can be reached at [phone].Suzanne Spears

## 2012-09-16 NOTE — Telephone Encounter (Signed)
Results have been received. Since BQ is off until Monday and pt is requesting these results prior. Will forward to doc of the day. Please advise dr. Maple Hudson thanks

## 2012-09-20 ENCOUNTER — Encounter: Payer: Self-pay | Admitting: Pulmonary Disease

## 2012-09-21 ENCOUNTER — Telehealth: Payer: Self-pay | Admitting: *Deleted

## 2012-09-21 ENCOUNTER — Encounter: Payer: Self-pay | Admitting: Pulmonary Disease

## 2012-09-21 NOTE — Telephone Encounter (Signed)
Message copied by Christen Butter on Tue Sep 21, 2012  9:22 AM ------      Message from: Max Fickle B      Created: Tue Sep 21, 2012  9:11 AM       L,      Please let her know that her sleep study was normal, no sleep apnea      Thanks,      B ------

## 2012-09-21 NOTE — Telephone Encounter (Signed)
Spoke with pt and notified of results per Dr.McQuaid. Pt verbalized understanding and denied any questions. 

## 2012-09-22 ENCOUNTER — Other Ambulatory Visit: Payer: Self-pay | Admitting: Neurosurgery

## 2012-09-27 ENCOUNTER — Telehealth: Payer: Self-pay | Admitting: Pulmonary Disease

## 2012-09-27 MED ORDER — FLUNISOLIDE HFA 80 MCG/ACT IN AERS: 1.0000 | INHALATION_SPRAY | Freq: Two times a day (BID) | RESPIRATORY_TRACT | Status: DC

## 2012-09-27 NOTE — Telephone Encounter (Signed)
Patient Instructions    Take the aerospan one puff twice a day for a month and then call us to let us know if you feel better after taking it (it may take several weeks for it to work   --  lmtcb x1 for pt RX has been sent

## 2012-09-28 NOTE — Telephone Encounter (Signed)
rx sent and pt is aware. Jennifer Castillo, CMA  

## 2012-10-05 ENCOUNTER — Ambulatory Visit: Payer: BC Managed Care – PPO | Admitting: Pulmonary Disease

## 2012-10-08 ENCOUNTER — Encounter: Payer: Self-pay | Admitting: Pulmonary Disease

## 2012-10-11 ENCOUNTER — Encounter (HOSPITAL_COMMUNITY): Payer: Self-pay | Admitting: Pharmacy Technician

## 2012-10-11 ENCOUNTER — Telehealth: Payer: Self-pay | Admitting: Pulmonary Disease

## 2012-10-11 MED ORDER — MONTELUKAST SODIUM 10 MG PO TABS: 10.0000 mg | ORAL_TABLET | Freq: Every day | ORAL | Status: DC

## 2012-10-11 NOTE — Telephone Encounter (Signed)
refaxed referral and records to [facility] for pul rehab Suzanne Spears

## 2012-10-11 NOTE — Telephone Encounter (Signed)
I spoke with the pt and she has several requests. The first is that she received a letter about a missed appointment on 10-05-12 and states she never received a reminder call about this. I apologized for this and asked if she wanted to r/s. She states she is going to have surgery soon and cannot r/s at this time. She states she is doing well as far as her breathing goes.  The second issue is that the pt states her pharmacy has been requesting a refill on her Singulair but they have not received a response. I advised I will send in a refill now.  The pt also states she has not heard from pulmonary rehab and is upset it has taken this long to hear anything. I advised that we did fax over referral on 08-03-12 and that I will have [facility] check on the status of this.   [facility] please advise on pulmonary rehab referral. Pt states she has not heard anything about this and [facility] does not usually have this long of a wait list. Carron Curie, CMA

## 2012-10-11 NOTE — Telephone Encounter (Signed)
Spoke to pt she is aware referral resent and stephen fro [facility] will call her Tobe Sos

## 2012-10-13 ENCOUNTER — Encounter (HOSPITAL_COMMUNITY)
Admission: RE | Admit: 2012-10-13 | Discharge: 2012-10-13 | Disposition: A | Discharge status: Home / Self Care | Payer: BC Managed Care – PPO | Source: Ambulatory Visit | Attending: Neurosurgery | Admitting: Neurosurgery

## 2012-10-13 ENCOUNTER — Encounter (HOSPITAL_COMMUNITY): Payer: Self-pay

## 2012-10-13 ENCOUNTER — Ambulatory Visit (HOSPITAL_COMMUNITY)
Admission: RE | Admit: 2012-10-13 | Discharge: 2012-10-13 | Disposition: A | Discharge status: Home / Self Care | Payer: BC Managed Care – PPO | Source: Ambulatory Visit | Attending: Anesthesiology | Admitting: Anesthesiology

## 2012-10-13 DIAGNOSIS — M5412 Radiculopathy, cervical region: Secondary | ICD-10-CM | POA: Insufficient documentation

## 2012-10-13 DIAGNOSIS — Z01811 Encounter for preprocedural respiratory examination: Secondary | ICD-10-CM | POA: Insufficient documentation

## 2012-10-13 DIAGNOSIS — Z01812 Encounter for preprocedural laboratory examination: Secondary | ICD-10-CM | POA: Insufficient documentation

## 2012-10-13 DIAGNOSIS — M5030 Other cervical disc degeneration, unspecified cervical region: Secondary | ICD-10-CM | POA: Insufficient documentation

## 2012-10-13 HISTORY — DX: Headache: R51

## 2012-10-13 HISTORY — DX: Cardiac murmur, unspecified: R01.1

## 2012-10-13 LAB — BASIC METABOLIC PANEL
BUN: 17 mg/dL (ref 6–23)
CO2: 21 mEq/L (ref 19–32)
Calcium: 9.4 mg/dL (ref 8.4–10.5)
Chloride: 109 mEq/L (ref 96–112)
Creatinine, Ser: 1.14 mg/dL — ABNORMAL HIGH (ref 0.50–1.10)
GFR calc Af Amer: 63 mL/min — ABNORMAL LOW (ref >90)
GFR calc non Af Amer: 55 mL/min — ABNORMAL LOW (ref >90)
Glucose, Bld: 91 mg/dL (ref 70–99)
Potassium: 4.1 mEq/L (ref 3.5–5.1)
Sodium: 141 mEq/L (ref 135–145)

## 2012-10-13 LAB — SURGICAL PCR SCREEN
MRSA, PCR: NEGATIVE
Staphylococcus aureus: NEGATIVE

## 2012-10-13 LAB — CBC
HCT: 41.7 % (ref 36.0–46.0)
Hemoglobin: 14.2 g/dL (ref 12.0–15.0)
MCH: 31.8 pg (ref 26.0–34.0)
MCHC: 34.1 g/dL (ref 30.0–36.0)
MCV: 93.5 fL (ref 78.0–100.0)
Platelets: 293 10*3/uL (ref 150–400)
RBC: 4.46 MIL/uL (ref 3.87–5.11)
RDW: 13.4 % (ref 11.5–15.5)
WBC: 6.5 10*3/uL (ref 4.0–10.5)

## 2012-10-13 LAB — HCG, SERUM, QUALITATIVE: Preg, Serum: NEGATIVE

## 2012-10-13 NOTE — Pre-Procedure Instructions (Signed)
Suzanne Spears  10/13/2012   Your procedure is scheduled on: Thursday, October 21, 2012  Report to [facility] at 5:30 AM.  Call this number if you have problems the morning of surgery: [phone]   Remember:   Do not eat food or drink liquids after midnight.    Take these medicines the morning of surgery with A SIP OF WATER:   DULoxetine (CYMBALTA) 60 MG capsule, norethindrone-ethinyl  estradiol (GILDESS FE 1/20) 1-20 MG-MCG tablet, Flunisolide HFA (AEROSPAN) 80 MCG/ACT AERS, if needed:acetaminophen  (TYLENOL) 500 MG tablet for pain OR  traMADol (ULTRAM) 50 MG tablet for pain, albuterol (PROVENTIL HFA;VENTOLIN HFA) 108  (90 BASE) MCG/ACT inhaler for shortness of breath ( bring in on day of surgery)  Stop taking Aspirin and herbal medications. Do not take any NSAIDs ie: Ibuprofen, Advil, Naproxen or any medication  containing Aspirin.   Do not wear jewelry, make-up or nail polish.  Do not wear lotions, powders, or perfumes. You may wear deodorant.  Do not shave 48 hours prior to surgery.  Do not bring valuables to the hospital.  [facility] is not responsible for any belongings or valuables.  Contacts, dentures or bridgework may not be worn into surgery.  Leave suitcase in the car. After surgery it may be brought to your room.  For patients admitted to the hospital, checkout time is 11:00 AM the day of discharge.   Patients discharged the day of surgery will not be allowed to drive home.  Name and phone number of your driver:   Special Instructions: Shower using CHG 2 nights before surgery and the night before surgery.  If you shower the day of surgery use CHG.  Use special wash - you have one bottle of CHG for all showers.  You should use approximately 1/3 of the bottle for each shower.   Please read over the following fact sheets that you were given: Pain Booklet, Coughing and Deep Breathing, MRSA Information and Surgical Site Infection Prevention

## 2012-10-13 NOTE — Progress Notes (Signed)
Pt denies SOB and chest pain. Pt states that she is under the care of Dr. Gwen Pounds ( cardiologist at [facility]). Pt states that an EKG was done by Dr. Gwen Pounds; results were requested along with latest office notes Pt states that she had a cardiac catheterization in the past and an EKG within the last year at [facility] requested along with latest office notes. Pt has an extensive cardiac history which includes MI and MVP. Chart left for [place], [place]   ( anesthesia) to review abnormal EKG, echo, and stress test results.

## 2012-10-14 ENCOUNTER — Encounter (HOSPITAL_COMMUNITY): Payer: Self-pay

## 2012-10-14 NOTE — Progress Notes (Signed)
Anesthesia Chart Review:  Patient is a 52 year old female scheduled for C5-6, C6-7 ACDF on 10/21/12 by Dr. Lovell Sheehan.  History includes non-smoker, "CAD" (normal coronaries by cath 2006), reported "MI" in 2010 but not mentioned in her cardiology note from 07/07/12, chronic left BBB, MVP/MR, moderate dilated "non-ischemic" cardiomyopathy, pericarditis '08, asthma, headaches, chronic SOB, tonsillectomy, appendectomy.  She had a "normal" stress study in 2014. PCP is listed as Dr. Darrick Huntsman.  Pulmonologist is Dr. Maple Hudson.  Cardiologist is Dr. Arnoldo Hooker who cleared patient for this procedure.  Records from [facility] showed: 1) Echo 12/03/10: moderate global LV dysfunction, EF 35%, moderate mitral insufficiency, mild tricuspid and aortic insufficiency, mild MVP. 2) Indeterminate treadmill EKG but excellent exercise tolerance without rhythm disturbance or clinical angina on 11/03/11. 3) Baseline EKG of NSR with left BBB, but not evidence of malignant arrhythmias or significant tachycardia by Holter monitor of 11/17/11.   [facility] records: EKG 04/22/12 showed SR with short PR, incomplete left BBB, prolonged QT.  Cardiac cath on 01/13/05 showed normal coronaries, EF 50%. PFTs from 06/05/11 showed FVC 3.54 (108%), FEV1 2.69 (90%), FEF 25-75% 3.01 (52%).  Preoperative CXR and labs noted.  Patient has been cleared by cardiology.  If no acute changes then anticipate that she can proceed as planned.  Velna Ochs [facility]/Anesthesiology Phone [phone] 10/14/2012 6:39 PM

## 2012-10-15 NOTE — Progress Notes (Signed)
I have called over to [facility] and spoke with Crystal.  Patient has NOT had another Echo this yr.  So, the last one was 2012.  DA

## 2012-10-20 MED ORDER — VANCOMYCIN HCL IN DEXTROSE 1-5 GM/200ML-% IV SOLN
1000.0000 mg | INTRAVENOUS | Status: AC
  Administered 2012-10-21: 1000 mg via INTRAVENOUS
  Filled 2012-10-20: qty 200

## 2012-10-20 NOTE — Progress Notes (Signed)
Patient notified to arrive at 0745  

## 2012-10-21 ENCOUNTER — Ambulatory Visit (HOSPITAL_COMMUNITY)
Admission: RE | Admit: 2012-10-21 | Discharge: 2012-10-21 | Disposition: A | Discharge status: Home / Self Care | Payer: BC Managed Care – PPO | Source: Ambulatory Visit | Attending: Neurosurgery | Admitting: Neurosurgery

## 2012-10-21 ENCOUNTER — Ambulatory Visit (HOSPITAL_COMMUNITY): Payer: BC Managed Care – PPO

## 2012-10-21 ENCOUNTER — Ambulatory Visit (HOSPITAL_COMMUNITY): Payer: BC Managed Care – PPO | Admitting: Anesthesiology

## 2012-10-21 ENCOUNTER — Encounter (HOSPITAL_COMMUNITY): Payer: Self-pay | Admitting: Anesthesiology

## 2012-10-21 ENCOUNTER — Encounter (HOSPITAL_COMMUNITY): Payer: Self-pay | Admitting: Vascular Surgery

## 2012-10-21 ENCOUNTER — Encounter (HOSPITAL_COMMUNITY)
Admission: RE | Disposition: A | Discharge status: Home / Self Care | Payer: Self-pay | Source: Ambulatory Visit | Attending: Neurosurgery

## 2012-10-21 DIAGNOSIS — I428 Other cardiomyopathies: Secondary | ICD-10-CM | POA: Insufficient documentation

## 2012-10-21 DIAGNOSIS — I059 Rheumatic mitral valve disease, unspecified: Secondary | ICD-10-CM | POA: Diagnosis not present

## 2012-10-21 DIAGNOSIS — I447 Left bundle-branch block, unspecified: Secondary | ICD-10-CM | POA: Insufficient documentation

## 2012-10-21 DIAGNOSIS — J45909 Unspecified asthma, uncomplicated: Secondary | ICD-10-CM | POA: Diagnosis not present

## 2012-10-21 DIAGNOSIS — M5030 Other cervical disc degeneration, unspecified cervical region: Secondary | ICD-10-CM | POA: Insufficient documentation

## 2012-10-21 DIAGNOSIS — G43909 Migraine, unspecified, not intractable, without status migrainosus: Secondary | ICD-10-CM | POA: Insufficient documentation

## 2012-10-21 DIAGNOSIS — M4712 Other spondylosis with myelopathy, cervical region: Secondary | ICD-10-CM

## 2012-10-21 DIAGNOSIS — Z79899 Other long term (current) drug therapy: Secondary | ICD-10-CM | POA: Insufficient documentation

## 2012-10-21 DIAGNOSIS — I252 Old myocardial infarction: Secondary | ICD-10-CM | POA: Insufficient documentation

## 2012-10-21 DIAGNOSIS — M47812 Spondylosis without myelopathy or radiculopathy, cervical region: Secondary | ICD-10-CM | POA: Diagnosis not present

## 2012-10-21 HISTORY — PX: ANTERIOR CERVICAL DECOMP/DISCECTOMY FUSION: SHX1161

## 2012-10-21 SURGERY — ANTERIOR CERVICAL DECOMPRESSION/DISCECTOMY FUSION 2 LEVELS
Anesthesia: General | Site: Neck | Wound class: Clean

## 2012-10-21 MED ORDER — NEOSTIGMINE METHYLSULFATE 1 MG/ML IJ SOLN
INTRAMUSCULAR | Status: DC | PRN
  Administered 2012-10-21: 2 mg via INTRAVENOUS

## 2012-10-21 MED ORDER — ALUM & MAG HYDROXIDE-SIMETH 200-200-20 MG/5ML PO SUSP: 30.0000 mL | Freq: Four times a day (QID) | ORAL | Status: DC | PRN

## 2012-10-21 MED ORDER — PHENOL 1.4 % MT LIQD: 1.0000 | OROMUCOSAL | Status: DC | PRN

## 2012-10-21 MED ORDER — EPHEDRINE SULFATE 50 MG/ML IJ SOLN
INTRAMUSCULAR | Status: DC | PRN
  Administered 2012-10-21: 10 mg via INTRAVENOUS
  Administered 2012-10-21: 5 mg via INTRAVENOUS
  Administered 2012-10-21 (×2): 10 mg via INTRAVENOUS
  Administered 2012-10-21: 5 mg via INTRAVENOUS
  Administered 2012-10-21: 10 mg via INTRAVENOUS
  Administered 2012-10-21: 5 mg via INTRAVENOUS
  Administered 2012-10-21: 10 mg via INTRAVENOUS

## 2012-10-21 MED ORDER — MIDAZOLAM HCL 2 MG/2ML IJ SOLN: 0.5000 mg | Freq: Once | INTRAMUSCULAR | Status: DC | PRN

## 2012-10-21 MED ORDER — SODIUM CHLORIDE 0.9 % IV SOLN
INTRAVENOUS | Status: DC | PRN
  Administered 2012-10-21: 10:00:00 via INTRAVENOUS

## 2012-10-21 MED ORDER — DSS 100 MG PO CAPS: 100.0000 mg | ORAL_CAPSULE | Freq: Two times a day (BID) | ORAL | Status: DC

## 2012-10-21 MED ORDER — VITAMIN B-12 5000 MCG SL SUBL: 5000.0000 ug | SUBLINGUAL_TABLET | Freq: Every day | SUBLINGUAL | Status: DC

## 2012-10-21 MED ORDER — DEXAMETHASONE SODIUM PHOSPHATE 4 MG/ML IJ SOLN: 4.0000 mg | Freq: Four times a day (QID) | INTRAMUSCULAR | Status: DC

## 2012-10-21 MED ORDER — THROMBIN 5000 UNITS EX SOLR
CUTANEOUS | Status: DC | PRN
  Administered 2012-10-21 (×2): 5000 [IU] via TOPICAL

## 2012-10-21 MED ORDER — MAGNESIUM 30 MG PO TABS
30.0000 mg | ORAL_TABLET | Freq: Every day | ORAL | Status: DC
Start: 2012-10-21 — End: 2012-10-21

## 2012-10-21 MED ORDER — DULOXETINE HCL 60 MG PO CPEP: 60.0000 mg | ORAL_CAPSULE | Freq: Every day | ORAL | Status: DC

## 2012-10-21 MED ORDER — TOPIRAMATE 25 MG PO TABS
150.0000 mg | ORAL_TABLET | Freq: Every day | ORAL | Status: DC
  Filled 2012-10-21: qty 2

## 2012-10-21 MED ORDER — ROCURONIUM BROMIDE 100 MG/10ML IV SOLN
INTRAVENOUS | Status: DC | PRN
  Administered 2012-10-21: 50 mg via INTRAVENOUS

## 2012-10-21 MED ORDER — OXYCODONE-ACETAMINOPHEN 10-325 MG PO TABS: 1.0000 | ORAL_TABLET | ORAL | Status: DC | PRN

## 2012-10-21 MED ORDER — ONDANSETRON HCL 4 MG/2ML IJ SOLN: 4.0000 mg | INTRAMUSCULAR | Status: DC | PRN

## 2012-10-21 MED ORDER — FLUNISOLIDE HFA 80 MCG/ACT IN AERS: 1.0000 | INHALATION_SPRAY | Freq: Two times a day (BID) | RESPIRATORY_TRACT | Status: DC

## 2012-10-21 MED ORDER — MENTHOL 3 MG MT LOZG: 1.0000 | LOZENGE | OROMUCOSAL | Status: DC | PRN

## 2012-10-21 MED ORDER — HYDROMORPHONE HCL PF 1 MG/ML IJ SOLN
INTRAMUSCULAR | Status: AC
  Filled 2012-10-21: qty 1

## 2012-10-21 MED ORDER — HEMOSTATIC AGENTS (NO CHARGE) OPTIME
TOPICAL | Status: DC | PRN
  Administered 2012-10-21: 1 via TOPICAL

## 2012-10-21 MED ORDER — MEPERIDINE HCL 25 MG/ML IJ SOLN: 6.2500 mg | INTRAMUSCULAR | Status: DC | PRN

## 2012-10-21 MED ORDER — MAGNESIUM GLUCONATE 500 MG PO TABS: 500.0000 mg | ORAL_TABLET | Freq: Every day | ORAL | Status: DC

## 2012-10-21 MED ORDER — VANCOMYCIN HCL IN DEXTROSE 1-5 GM/200ML-% IV SOLN
1000.0000 mg | Freq: Once | INTRAVENOUS | Status: DC
  Filled 2012-10-21: qty 200

## 2012-10-21 MED ORDER — OXYCODONE HCL 5 MG/5ML PO SOLN: 5.0000 mg | Freq: Once | ORAL | Status: AC | PRN

## 2012-10-21 MED ORDER — DOCUSATE SODIUM 100 MG PO CAPS: 100.0000 mg | ORAL_CAPSULE | Freq: Two times a day (BID) | ORAL | Status: DC

## 2012-10-21 MED ORDER — PANTOPRAZOLE SODIUM 40 MG IV SOLR
40.0000 mg | Freq: Every day | INTRAVENOUS | Status: DC
  Filled 2012-10-21: qty 40

## 2012-10-21 MED ORDER — HYDROMORPHONE HCL PF 1 MG/ML IJ SOLN
0.2500 mg | INTRAMUSCULAR | Status: DC | PRN
  Administered 2012-10-21 (×4): 0.5 mg via INTRAVENOUS

## 2012-10-21 MED ORDER — MIDAZOLAM HCL 5 MG/5ML IJ SOLN
INTRAMUSCULAR | Status: DC | PRN
  Administered 2012-10-21: 2 mg via INTRAVENOUS

## 2012-10-21 MED ORDER — LORATADINE 10 MG PO TABS: 10.0000 mg | ORAL_TABLET | Freq: Every day | ORAL | Status: DC

## 2012-10-21 MED ORDER — GLYCOPYRROLATE 0.2 MG/ML IJ SOLN
INTRAMUSCULAR | Status: DC | PRN
  Administered 2012-10-21: 0.2 mg via INTRAVENOUS

## 2012-10-21 MED ORDER — PROPOFOL 10 MG/ML IV BOLUS
INTRAVENOUS | Status: DC | PRN
  Administered 2012-10-21: 100 mg via INTRAVENOUS

## 2012-10-21 MED ORDER — DEXAMETHASONE 4 MG PO TABS
4.0000 mg | ORAL_TABLET | Freq: Four times a day (QID) | ORAL | Status: DC
  Administered 2012-10-21: 4 mg via ORAL
  Filled 2012-10-21: qty 1

## 2012-10-21 MED ORDER — ACETAMINOPHEN 325 MG PO TABS: 650.0000 mg | ORAL_TABLET | ORAL | Status: DC | PRN

## 2012-10-21 MED ORDER — FLUTICASONE PROPIONATE 50 MCG/ACT NA SUSP
2.0000 | Freq: Every day | NASAL | Status: DC
  Filled 2012-10-21: qty 16

## 2012-10-21 MED ORDER — OXYCODONE-ACETAMINOPHEN 5-325 MG PO TABS
1.0000 | ORAL_TABLET | ORAL | Status: DC | PRN
  Administered 2012-10-21: 2 via ORAL
  Filled 2012-10-21: qty 2

## 2012-10-21 MED ORDER — ARTIFICIAL TEARS OP OINT
TOPICAL_OINTMENT | OPHTHALMIC | Status: DC | PRN
  Administered 2012-10-21: 1 via OPHTHALMIC

## 2012-10-21 MED ORDER — LACTATED RINGERS IV SOLN: INTRAVENOUS | Status: DC

## 2012-10-21 MED ORDER — HYDROCODONE-ACETAMINOPHEN 5-325 MG PO TABS: 1.0000 | ORAL_TABLET | ORAL | Status: DC | PRN

## 2012-10-21 MED ORDER — MONTELUKAST SODIUM 10 MG PO TABS
10.0000 mg | ORAL_TABLET | Freq: Every day | ORAL | Status: DC
  Filled 2012-10-21: qty 1

## 2012-10-21 MED ORDER — 0.9 % SODIUM CHLORIDE (POUR BTL) OPTIME
TOPICAL | Status: DC | PRN
  Administered 2012-10-21: 1000 mL

## 2012-10-21 MED ORDER — DEXAMETHASONE SODIUM PHOSPHATE 4 MG/ML IJ SOLN
INTRAMUSCULAR | Status: DC | PRN
  Administered 2012-10-21: 4 mg via INTRAVENOUS

## 2012-10-21 MED ORDER — MORPHINE SULFATE 2 MG/ML IJ SOLN
INTRAMUSCULAR | Status: AC
  Filled 2012-10-21: qty 1

## 2012-10-21 MED ORDER — MORPHINE SULFATE 2 MG/ML IJ SOLN
1.0000 mg | INTRAMUSCULAR | Status: DC | PRN
  Administered 2012-10-21: 2 mg via INTRAVENOUS

## 2012-10-21 MED ORDER — CYANOCOBALAMIN 500 MCG PO TABS
500.0000 ug | ORAL_TABLET | Freq: Every day | ORAL | Status: DC
  Filled 2012-10-21: qty 1

## 2012-10-21 MED ORDER — NORETHIN ACE-ETH ESTRAD-FE 1-20 MG-MCG PO TABS: 1.0000 | ORAL_TABLET | Freq: Every day | ORAL | Status: DC

## 2012-10-21 MED ORDER — LIDOCAINE HCL (CARDIAC) 20 MG/ML IV SOLN
INTRAVENOUS | Status: DC | PRN
  Administered 2012-10-21: 30 mg via INTRAVENOUS

## 2012-10-21 MED ORDER — DIAZEPAM 5 MG PO TABS: 5.0000 mg | ORAL_TABLET | Freq: Four times a day (QID) | ORAL | Status: DC | PRN

## 2012-10-21 MED ORDER — BACITRACIN ZINC 500 UNIT/GM EX OINT
TOPICAL_OINTMENT | CUTANEOUS | Status: DC | PRN
  Administered 2012-10-21: 1 via TOPICAL

## 2012-10-21 MED ORDER — PROMETHAZINE HCL 25 MG/ML IJ SOLN: 6.2500 mg | INTRAMUSCULAR | Status: DC | PRN

## 2012-10-21 MED ORDER — ONDANSETRON HCL 4 MG/2ML IJ SOLN
INTRAMUSCULAR | Status: DC | PRN
  Administered 2012-10-21: 4 mg via INTRAVENOUS

## 2012-10-21 MED ORDER — FENTANYL CITRATE 0.05 MG/ML IJ SOLN
INTRAMUSCULAR | Status: DC | PRN
  Administered 2012-10-21: 100 ug via INTRAVENOUS
  Administered 2012-10-21: 50 ug via INTRAVENOUS
  Administered 2012-10-21: 150 ug via INTRAVENOUS
  Administered 2012-10-21: 50 ug via INTRAVENOUS

## 2012-10-21 MED ORDER — DIAZEPAM 5 MG PO TABS
5.0000 mg | ORAL_TABLET | Freq: Four times a day (QID) | ORAL | Status: DC | PRN
  Administered 2012-10-21: 5 mg via ORAL
  Filled 2012-10-21: qty 1

## 2012-10-21 MED ORDER — OXYCODONE HCL 5 MG PO TABS
5.0000 mg | ORAL_TABLET | Freq: Once | ORAL | Status: AC | PRN
  Administered 2012-10-21: 5 mg via ORAL

## 2012-10-21 MED ORDER — ACETAMINOPHEN 650 MG RE SUPP: 650.0000 mg | RECTAL | Status: DC | PRN

## 2012-10-21 MED ORDER — LACTATED RINGERS IV SOLN
INTRAVENOUS | Status: DC | PRN
  Administered 2012-10-21 (×2): via INTRAVENOUS

## 2012-10-21 MED ORDER — OXYCODONE HCL 5 MG PO TABS
ORAL_TABLET | ORAL | Status: AC
  Filled 2012-10-21: qty 1

## 2012-10-21 MED ORDER — PHENYLEPHRINE HCL 10 MG/ML IJ SOLN
INTRAMUSCULAR | Status: DC | PRN
  Administered 2012-10-21 (×16): 40 ug via INTRAVENOUS

## 2012-10-21 MED ORDER — ALBUTEROL SULFATE HFA 108 (90 BASE) MCG/ACT IN AERS
2.0000 | INHALATION_SPRAY | Freq: Every day | RESPIRATORY_TRACT | Status: DC | PRN
  Filled 2012-10-21: qty 6.7

## 2012-10-21 MED ORDER — SODIUM CHLORIDE 0.9 % IR SOLN
Status: DC | PRN
  Administered 2012-10-21: 10:00:00

## 2012-10-21 MED ORDER — FLUTICASONE PROPIONATE HFA 44 MCG/ACT IN AERO
1.0000 | INHALATION_SPRAY | Freq: Two times a day (BID) | RESPIRATORY_TRACT | Status: DC
  Filled 2012-10-21: qty 10.6

## 2012-10-21 SURGICAL SUPPLY — 62 items
BAG DECANTER FOR FLEXI CONT (MISCELLANEOUS) ×2 IMPLANT
BENZOIN TINCTURE PRP APPL 2/3 (GAUZE/BANDAGES/DRESSINGS) ×2 IMPLANT
BIT DRILL NEURO 2X3.1 SFT TUCH (MISCELLANEOUS) ×1 IMPLANT
BLADE SURG 15 STRL LF DISP TIS (BLADE) ×1 IMPLANT
BLADE SURG 15 STRL SS (BLADE) ×1
BLADE ULTRA TIP 2M (BLADE) ×2 IMPLANT
BRUSH SCRUB EZ PLAIN DRY (MISCELLANEOUS) ×2 IMPLANT
BUR BARREL STRAIGHT FLUTE 4.0 (BURR) ×2 IMPLANT
BUR MATCHSTICK NEURO 3.0 LAGG (BURR) ×2 IMPLANT
CANISTER SUCTION 2500CC (MISCELLANEOUS) ×2 IMPLANT
CLOTH BEACON ORANGE TIMEOUT ST (SAFETY) ×2 IMPLANT
CONT SPEC 4OZ CLIKSEAL STRL BL (MISCELLANEOUS) ×2 IMPLANT
COVER MAYO STAND STRL (DRAPES) ×2 IMPLANT
DERMABOND ADHESIVE PROPEN (GAUZE/BANDAGES/DRESSINGS) ×1
DERMABOND ADVANCED .7 DNX6 (GAUZE/BANDAGES/DRESSINGS) ×1 IMPLANT
DEVICE FUSION VIST S 14X14X6MM (Trauma) ×2 IMPLANT
DRAPE LAPAROTOMY 100X72 PEDS (DRAPES) ×2 IMPLANT
DRAPE MICROSCOPE LEICA (MISCELLANEOUS) IMPLANT
DRAPE POUCH INSTRU U-SHP 10X18 (DRAPES) ×2 IMPLANT
DRAPE SURG 17X23 STRL (DRAPES) ×4 IMPLANT
DRILL NEURO 2X3.1 SOFT TOUCH (MISCELLANEOUS) ×2
ELECT REM PT RETURN 9FT ADLT (ELECTROSURGICAL) ×2
ELECTRODE REM PT RTRN 9FT ADLT (ELECTROSURGICAL) ×1 IMPLANT
GAUZE SPONGE 4X4 16PLY XRAY LF (GAUZE/BANDAGES/DRESSINGS) IMPLANT
GLOVE BIO SURGEON STRL SZ8.5 (GLOVE) ×2 IMPLANT
GLOVE ECLIPSE 7.5 STRL STRAW (GLOVE) ×2 IMPLANT
GLOVE EXAM NITRILE LRG STRL (GLOVE) IMPLANT
GLOVE EXAM NITRILE MD LF STRL (GLOVE) ×2 IMPLANT
GLOVE EXAM NITRILE XL STR (GLOVE) IMPLANT
GLOVE EXAM NITRILE XS STR PU (GLOVE) IMPLANT
GLOVE INDICATOR 7.5 STRL GRN (GLOVE) ×2 IMPLANT
GLOVE SS BIOGEL STRL SZ 8 (GLOVE) ×1 IMPLANT
GLOVE SUPERSENSE BIOGEL SZ 8 (GLOVE) ×1
GLOVE SURG SS PI 8.0 STRL IVOR (GLOVE) ×8 IMPLANT
GOWN BRE IMP SLV AUR LG STRL (GOWN DISPOSABLE) IMPLANT
GOWN BRE IMP SLV AUR XL STRL (GOWN DISPOSABLE) ×8 IMPLANT
KIT BASIN OR (CUSTOM PROCEDURE TRAY) ×2 IMPLANT
KIT ROOM TURNOVER OR (KITS) ×2 IMPLANT
MARKER SKIN DUAL TIP RULER LAB (MISCELLANEOUS) ×2 IMPLANT
NEEDLE HYPO 22GX1.5 SAFETY (NEEDLE) ×2 IMPLANT
NEEDLE SPNL 18GX3.5 QUINCKE PK (NEEDLE) ×2 IMPLANT
NS IRRIG 1000ML POUR BTL (IV SOLUTION) ×2 IMPLANT
PACK LAMINECTOMY NEURO (CUSTOM PROCEDURE TRAY) ×2 IMPLANT
PATTIES SURGICAL .5 X.5 (GAUZE/BANDAGES/DRESSINGS) ×2 IMPLANT
PIN DISTRACTION 14MM (PIN) ×4 IMPLANT
PLATE ANT CERV XTEND 2 LV 28 (Plate) ×2 IMPLANT
PUTTY 2.5ML ACTIFUSE ABX (Putty) ×2 IMPLANT
RUBBERBAND STERILE (MISCELLANEOUS) IMPLANT
SCREW XTD VAR 4.2 SELF TAP 12 (Screw) ×12 IMPLANT
SPONGE GAUZE 4X4 12PLY (GAUZE/BANDAGES/DRESSINGS) ×2 IMPLANT
SPONGE INTESTINAL PEANUT (DISPOSABLE) ×4 IMPLANT
SPONGE SURGIFOAM ABS GEL SZ50 (HEMOSTASIS) ×2 IMPLANT
STRIP CLOSURE SKIN 1/2X4 (GAUZE/BANDAGES/DRESSINGS) ×2 IMPLANT
SUT VIC AB 0 CT1 27 (SUTURE) ×1
SUT VIC AB 0 CT1 27XBRD ANTBC (SUTURE) ×1 IMPLANT
SUT VIC AB 3-0 SH 8-18 (SUTURE) ×2 IMPLANT
SYR 20ML ECCENTRIC (SYRINGE) ×2 IMPLANT
TAPE STRIPS DRAPE STRL (GAUZE/BANDAGES/DRESSINGS) ×2 IMPLANT
TOWEL OR 17X24 6PK STRL BLUE (TOWEL DISPOSABLE) ×2 IMPLANT
TOWEL OR 17X26 10 PK STRL BLUE (TOWEL DISPOSABLE) ×2 IMPLANT
VISTA S O 14X14X6MM (Trauma) ×4 IMPLANT
WATER STERILE IRR 1000ML POUR (IV SOLUTION) ×2 IMPLANT

## 2012-10-21 NOTE — Op Note (Signed)
Brief history: The patient is a 52 year old white female who has had chronic neck shoulder and arm pain consistent with a cervical radiculopathy. She has failed medical management and was worked up with a cervical MRI. This demonstrated this degeneration and spondylosis/stenosis at C5-6 and C6-7. I discussed the various treatment option with the patient including surgery. She has weighed the risks, benefits, and alternatives surgery and decided proceed with a C5-6 and C6-7 anterior cervicectomy, fusion, and plating.  Preoperative diagnosis: C5-6 and C6-7 disc degeneration, spondylosis, stenosis, cervical discopathy, cervicalgia  Postoperative diagnosis: The same  Procedure: C5-6 and C6-7 Anterior cervical discectomy/decompression; C5-C6 and C6-7 interbody arthrodesis with local morcellized autograft bone and Actifuse bone graft extender; insertion of interbody prosthesis at C5-6 and C6-7 (Zimmer peek interbody prosthesis); anterior cervical plating from C5-C7 with globus titanium plate  Surgeon: Dr. Delma Officer  Asst.: Dr. Marikay Alar  Anesthesia: Gen. endotracheal  Estimated blood loss: 100 cc  Drains: None  Complications: None  Description of procedure: The patient was brought to the operating room by the anesthesia team. General endotracheal anesthesia was induced. A roll was placed under the patient's shoulders to keep the neck in the neutral position. The patient's anterior cervical region was then prepared with Betadine scrub and Betadine solution. Sterile drapes were applied.  The area to be incised was then injected with Marcaine with epinephrine solution. I then used a scalpel to make a transverse incision in the patient's left anterior neck. I used the Metzenbaum scissors to divide the platysmal muscle and then to dissect medial to the sternocleidomastoid muscle, jugular vein, and carotid artery. I carefully dissected down towards the anterior cervical spine identifying the esophagus  and retracting it medially. Then using Kitner swabs to clear soft tissue from the anterior cervical spine. We then inserted a bent spinal needle into the upper exposed intervertebral disc space. We then obtained intraoperative radiographs confirm our location.  I then used electrocautery to detach the medial border of the longus colli muscle bilaterally from the C5-6 and C6-7 intervertebral disc spaces. I then inserted the Caspar self-retaining retractor underneath the longus colli muscle bilaterally to provide exposure.  We then incised the intervertebral disc at C6-7. We then performed a partial intervertebral discectomy with a pituitary forceps and the Karlin curettes. I then inserted distraction screws into the vertebral bodies at C6-7. We then distracted the interspace. We then used the high-speed drill to decorticate the vertebral endplates at C6-7, to drill away the remainder of the intervertebral disc, to drill away some posterior spondylosis, and to thin out the posterior longitudinal ligament. I then incised ligament with the arachnoid knife. We then removed the ligament with a Kerrison punches undercutting the vertebral endplates and decompressing the thecal sac. We then performed foraminotomies about the bilateral C7 nerve roots. This completed the decompression at this level.  We then repeated this procedure in an analogous fashion at C5-C6 decompressing the thecal sac and the bile C6 nerve roots.  We now turned our to attention to the interbody fusion. We used the trial spacers to determine the appropriate size for the interbody prosthesis. We then pre-filled prosthesis with a combination of local morcellized autograft bone that we obtained during decompression as well as Actifuse bone graft extender. We then inserted the prosthesis into the distracted interspace at C5-6 and C6-7. We then removed the distraction screws. There was a good snug fit of the prosthesis in the interspace.   Having  completed the fusion we now turned attention  to the anterior spinal instrumentation. We used the high-speed drill to drill away some anterior spondylosis at the disc spaces so that the plate lay down flat. We selected the appropriate length titanium anterior cervical plate. We laid it along the anterior aspect of the vertebral bodies from C5-C7. We then drilled 12 mm holes at C5, C6 and C7. We then secured the plate to the vertebral bodies by placing two 12 mm self-tapping screws at C5, C6, and C7. We then obtained intraoperative radiograph. The demonstrating good position of the instrumentation. We therefore secured the screws the plate the locking each cam. This completed the instrumentation.  We then obtained hemostasis using bipolar electrocautery. We irrigated the wound out with bacitracin solution. We then removed the retractor. We inspected the esophagus for any damage. There was none apparent. We then reapproximated patient's platysmal muscle with interrupted 3-0 Vicryl suture. We then reapproximated the subcutaneous tissue with interrupted 3-0 Vicryl suture. The skin was reapproximated with Steri-Strips and benzoin. The wound was then covered with bacitracin ointment. A sterile dressing was applied. The drapes were removed. Patient was subsequently extubated by the anesthesia team and transported to the post anesthesia care unit in stable condition. All sponge instrument and needle counts were reportedly correct at the end of this case.

## 2012-10-21 NOTE — Progress Notes (Signed)
PT. AMBULATING WITH STEADY GAIT, VOIDING WITHOUT DIFFICULTIES, TOLERATING DIET, AND REQUESTING TO BE DISCHARGED PER ORDERS. V/S STABLE. PT. VERBALIZED UNDERSTANDING OF D/C ORDERS. STAFF AND FAMILY ASSISTING PT. TO CAR VIA W/C. CHRIS Johncarlo Maalouf RN
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
## 2012-10-21 NOTE — Plan of Care (Signed)
Problem: Consults Goal: Diagnosis - Spinal Surgery Outcome: Completed/Met Date Met:  10/21/12 Cervical Spine Fusion     

## 2012-10-21 NOTE — Progress Notes (Signed)
Patient ID: Suzanne Spears, female   DOB: 1960/09/14, 52 y.o.   MRN: [id number] Subjective:  The patient is alert and pleasant. She looks well. She is in no apparent distress.  Objective: Vital signs in last 24 hours: Temp:  [97.3 F (36.3 C)-98 F (36.7 C)] 98 F (36.7 C) (08/28 1217) Pulse Rate:  [81-103] 103 (08/28 1221) Resp:  [20-25] 25 (08/28 1221) BP: (103-129)/(61-89) 103/61 mmHg (08/28 1221) SpO2:  [99 %-100 %] 99 % (08/28 1221)  Intake/Output from previous day:   Intake/Output this shift: Total I/O In: 1300 [I.V.:1300] Out: 100 [Blood:100]  Physical exam the patient is alert and pleasant. She is moving all 4 extremities well. Her wound is without evidence of hematoma or shift.  Lab Results: No results found for this basename: WBC, HGB, HCT, PLT,  in the last 72 hours BMET No results found for this basename: NA, K, CL, CO2, GLUCOSE, BUN, CREATININE, CALCIUM,  in the last 72 hours  Studies/Results: No results found.  Assessment/Plan: The patient is doing well.  LOS: 0 days     Azul Coffie D 10/21/2012, 12:25 PM

## 2012-10-21 NOTE — Progress Notes (Signed)
Orthopedic Tech Progress Note Patient Details:  Suzanne Spears [birth year] [id number]  Patient ID: Suzanne Spears, female   DOB: 08/16/1960, 52 y.o.   MRN: [id number]   Suzanne Spears 10/21/2012, 3:50 PMCalled bio-tech for aspen called.

## 2012-10-21 NOTE — Progress Notes (Signed)
I.V. Inserted by Ferne Reus, RN at 8:40am with an 18G catheter and not inserted by Leona Singleton.

## 2012-10-21 NOTE — Discharge Summary (Signed)
Physician Discharge Summary  Patient ID: Suzanne Spears MRN: [id number] DOB/AGE: 10/18/1960 52 y.o.  Admit date: 10/21/2012 Discharge date: 10/21/2012  Admission Diagnoses: C5-6 and C6-7 disc degeneration, spondylosis, stenosis, cervical radiculopathy, cervicalgia  Discharge Diagnoses: The same Active Problems:   * No active hospital problems. *   Discharged Condition: good  Hospital Course: I performed a C5-6 and C6-7 anterior cervical discectomy, fusion, and plating on the patient on 10/21/2012. The surgery went well.  The patient's postoperative course was unremarkable. On the day of surgery the patient requested discharge to home. She was given oral and written discharge instructions. All her questions were answered.  Consults: None Significant Diagnostic Studies: None Treatments: C5-6 and C6-7 anterior cervical discectomy, fusion, and plating. Discharge Exam: Blood pressure 104/61, pulse 104, temperature 98.2 F (36.8 C), temperature source Oral, resp. rate 16, SpO2 95.00%. The patient is alert and pleasant. She looks well. Her strength is grossly normal in all 4 extremities. Her wound is without evidence of hematoma or shift.  Disposition: Home  Discharge Orders   Future Orders Complete By Expires   Call MD for:  difficulty breathing, headache or visual disturbances  As directed    Call MD for:  extreme fatigue  As directed    Call MD for:  hives  As directed    Call MD for:  persistant dizziness or light-headedness  As directed    Call MD for:  persistant nausea and vomiting  As directed    Call MD for:  redness, tenderness, or signs of infection (pain, swelling, redness, odor or green/yellow discharge around incision site)  As directed    Call MD for:  severe uncontrolled pain  As directed    Call MD for:  temperature >100.4  As directed    Diet - low sodium heart healthy  As directed    Discharge instructions  As directed    Comments:     Call [phone] for a  followup appointment. Take a stool softener while you are using pain medications.   Driving Restrictions  As directed    Comments:     Do not drive for 2 weeks.   Increase activity slowly  As directed    Lifting restrictions  As directed    Comments:     Do not lift more than 5 pounds. No excessive bending or twisting.   May shower / Bathe  As directed    Comments:     He may shower after the pain she is removed 3 days after surgery. Leave the incision alone.   Remove dressing in 48 hours  As directed    Comments:     Your stitches are under the scan and will dissolve by themselves. The Steri-Strips will fall off after you take a few showers. Do not rub back or pick at the wound, Leave the wound alone.       Medication List    STOP taking these medications       acetaminophen 500 MG tablet  Commonly known as:  TYLENOL     cyclobenzaprine 10 MG tablet  Commonly known as:  FLEXERIL     traMADol 50 MG tablet  Commonly known as:  ULTRAM      TAKE these medications       albuterol 108 (90 BASE) MCG/ACT inhaler  Commonly known as:  PROVENTIL HFA;VENTOLIN HFA  Inhale 2 puffs into the lungs daily as needed for wheezing or shortness of breath.  diazepam 5 MG tablet  Commonly known as:  VALIUM  Take 1 tablet (5 mg total) by mouth every 6 (six) hours as needed.     DSS 100 MG Caps  Take 100 mg by mouth 2 (two) times daily.     DULoxetine 60 MG capsule  Commonly known as:  CYMBALTA  Take 1 capsule (60 mg total) by mouth daily.     fexofenadine 180 MG tablet  Commonly known as:  ALLEGRA  Take 180 mg by mouth at bedtime.     Flunisolide HFA 80 MCG/ACT Aers  Commonly known as:  AEROSPAN  Inhale 1 puff into the lungs 2 (two) times daily.     fluticasone 50 MCG/ACT nasal spray  Commonly known as:  FLONASE  Place 2 sprays into the nose at bedtime.     magnesium 30 MG tablet  Take 30 mg by mouth daily.     montelukast 10 MG tablet  Commonly known as:  SINGULAIR  Take  1 tablet (10 mg total) by mouth at bedtime.     norethindrone-ethinyl estradiol 1-20 MG-MCG tablet  Commonly known as:  GILDESS FE 1/20  Take 1 tablet by mouth daily.     oxyCODONE-acetaminophen 10-325 MG per tablet  Commonly known as:  PERCOCET  Take 1 tablet by mouth every 4 (four) hours as needed for pain.     topiramate 50 MG tablet  Commonly known as:  TOPAMAX  Take 150 mg by mouth at bedtime.     Vitamin B-12 5000 MCG Subl  Place 5,000 mcg under the tongue at bedtime.     cyanocobalamin 1000 MCG/ML injection  Commonly known as:  (VITAMIN B-12)  Inject 1,000 mcg into the muscle every 30 (thirty) days. Last injection September 26, 2012         Signed: Cristi Loron 10/21/2012, 4:48 PM

## 2012-10-21 NOTE — Anesthesia Preprocedure Evaluation (Addendum)
Anesthesia Evaluation  Patient identified by MRN, date of birth, ID band Patient awake    Reviewed: Allergy & Precautions, H&P , NPO status , Patient's Chart, lab work & pertinent test results  History of Anesthesia Complications Negative for: history of anesthetic complications  Airway Mallampati: II TM Distance: >3 FB Neck ROM: Full    Dental  (+) Teeth Intact and Dental Advisory Given   Pulmonary shortness of breath and with exertion, asthma ,  PFTs from 06/05/11 showed FVC 3.54 (108%), FEV1 2.69 (90%), FEF 25-75% 3.01 (52%). breath sounds clear to auscultation  Pulmonary exam normal       Cardiovascular + Past MI (2010) - CAD ('06 cath: normal coronaries) CHF: dilated cardiomyopathy, EF 35% - dysrhythmias (LBBB) + Valvular Problems/Murmurs (mod MR with MVP) MVP and MR Rhythm:Regular Rate:Normal  Cardiologist is Dr. Arnoldo Hooker who cleared patient for this procedure.  Records from [facility] showed: 1) Echo 12/03/10: moderate global LV dysfunction, EF 35%, moderate mitral insufficiency, mild tricuspid and aortic insufficiency, mild MVP. 2) Indeterminate treadmill EKG but excellent exercise tolerance without rhythm disturbance or clinical angina on 11/03/11. 3) Baseline EKG of NSR with left BBB, but not evidence of malignant arrhythmias or significant tachycardia by Holter monitor of 11/17/11.   Cardiac cath on 01/13/05 showed normal coronaries, EF 50%.  moderate dilated "non-ischemic" cardiomyopathy  pericarditis '08   Neuro/Psych  Headaches, negative psych ROS   GI/Hepatic negative GI ROS, Neg liver ROS,   Endo/Other  negative endocrine ROS  Renal/GU negative Renal ROS     Musculoskeletal negative musculoskeletal ROS (+)   Abdominal   Peds  Hematology negative hematology ROS (+)   Anesthesia Other Findings   Reproductive/Obstetrics negative OB ROS                      Anesthesia  Physical Anesthesia Plan  ASA: III  Anesthesia Plan: General   Post-op Pain Management:    Induction: Intravenous  Airway Management Planned: Oral ETT  Additional Equipment:   Intra-op Plan:   Post-operative Plan: Extubation in OR  Informed Consent:   Dental advisory given  Plan Discussed with: CRNA and Surgeon  Anesthesia Plan Comments: (Plan routine monitors, GETA)        Anesthesia Quick Evaluation
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
## 2012-10-21 NOTE — Transfer of Care (Signed)
Immediate Anesthesia Transfer of Care Note  Patient: JASMEEN FRITSCH  Procedure(s) Performed: Procedure(s) with comments: ANTERIOR CERVICAL DECOMPRESSION/DISCECTOMY FUSION 2 LEVELS (N/A) - C56 C67 anterior cervical decompression with fusion interbody prothesis plating and bonegraft  Patient Location: PACU  Anesthesia Type:General  Level of Consciousness: awake, alert , oriented and patient cooperative  Airway & Oxygen Therapy: Patient Spontanous Breathing and Patient connected to nasal cannula oxygen  Post-op Assessment: Report given to PACU RN, Post -op Vital signs reviewed and stable and Patient moving all extremities X 4  Post vital signs: Reviewed and stable  Complications: No apparent anesthesia complications

## 2012-10-21 NOTE — Anesthesia Procedure Notes (Signed)
Procedure Name: Intubation Date/Time: 10/21/2012 9:44 AM Performed by: Leona Singleton A Pre-anesthesia Checklist: Patient identified, Emergency Drugs available, Suction available and Patient being monitored Patient Re-evaluated:Patient Re-evaluated prior to inductionOxygen Delivery Method: Circle system utilized Preoxygenation: Pre-oxygenation with 100% oxygen Intubation Type: IV induction Ventilation: Mask ventilation without difficulty Laryngoscope Size: Miller and 2 Grade View: Grade I Tube type: Oral Tube size: 7.0 mm Number of attempts: 1 Airway Equipment and Method: Stylet Placement Confirmation: ETT inserted through vocal cords under direct vision,  positive ETCO2 and breath sounds checked- equal and bilateral Secured at: 22 cm Tube secured with: Tape Dental Injury: Teeth and Oropharynx as per pre-operative assessment

## 2012-10-21 NOTE — Preoperative (Signed)
Beta Blockers   Reason not to administer Beta Blockers:Not Applicable 

## 2012-10-21 NOTE — Progress Notes (Signed)
Called ortho for aspen collar.

## 2012-10-21 NOTE — Anesthesia Postprocedure Evaluation (Signed)
  Anesthesia Post-op Note  Patient: ZAIDE MCCLENAHAN  Procedure(s) Performed: Procedure(s) with comments: ANTERIOR CERVICAL DECOMPRESSION/DISCECTOMY FUSION 2 LEVELS (N/A) - C56 C67 anterior cervical decompression with fusion interbody prothesis plating and bonegraft  Patient Location: PACU  Anesthesia Type:General  Level of Consciousness: awake, alert , oriented and patient cooperative  Airway and Oxygen Therapy: Patient Spontanous Breathing and Patient connected to nasal cannula oxygen  Post-op Pain: mild  Post-op Assessment: Post-op Vital signs reviewed, Patient's Cardiovascular Status Stable, Respiratory Function Stable, Patent Airway, No signs of Nausea or vomiting and Pain level controlled  Post-op Vital Signs: Reviewed and stable  Complications: No apparent anesthesia complications

## 2012-10-22 ENCOUNTER — Other Ambulatory Visit: Payer: Self-pay | Admitting: *Deleted

## 2012-10-22 ENCOUNTER — Encounter (HOSPITAL_COMMUNITY): Payer: Self-pay | Admitting: Neurosurgery

## 2012-10-27 MED ORDER — NORETHIN ACE-ETH ESTRAD-FE 1-20 MG-MCG PO TABS: 1.0000 | ORAL_TABLET | Freq: Every day | ORAL | Status: DC

## 2013-01-31 ENCOUNTER — Other Ambulatory Visit: Payer: Self-pay | Admitting: *Deleted

## 2013-01-31 MED ORDER — DULOXETINE HCL 60 MG PO CPEP: 60.0000 mg | ORAL_CAPSULE | Freq: Every day | ORAL | Status: DC

## 2013-03-09 DIAGNOSIS — Z0279 Encounter for issue of other medical certificate: Secondary | ICD-10-CM

## 2013-03-23 ENCOUNTER — Telehealth: Payer: Self-pay | Admitting: Pulmonary Disease

## 2013-03-23 NOTE — Telephone Encounter (Signed)
Pt goes by Barbette Or TCB x1 at work number as requested

## 2013-03-23 NOTE — Telephone Encounter (Signed)
Pt returned call.  Spoke with patient who stated that per her last ov w/ BQ on 6.10.14, she was to begin [facility] and she needed FMLA in order to be able to do this per her employer.  Pt never did Pulm Rehab because she had to have neck/back surgery.  She would like to do Rehab now and was told by rehab that they would get the [facility] paperwork done.  As this is a 12 week course, with 2 classes each week her employer is now telling her that this will require "intermediate FMLA".  Pt stated that she brought the requisite paperwork to medical records >10 days ago and has yet to hear anything.  She is requesting an update.  Advised pt will call [employer] and either have them call her, or I will call her back.  Called [employer] at [phone] and spoke with Sharyn Lull.  Per Merceda Elks has been working on [employer] but she is not in the office this afternoon.  Asked Sharyn Lull to have Hoyle Sauer call pt tomorrow at her work number (also informed Sharyn Lull that pt goes by [name], so this is the name on pt's VM).  Called spoke with patient and informed her of the above conversation.  Pt okay with this and verbalized her understanding.  She is due for ov w/ BQ and requested to schedule ov and will need refills at that time.  Appt scheduled for 2.5.15 @ 0915 in [place] w/ BQ.  Pt aware to call if anything further is needed.  Will sign off.

## 2013-03-24 ENCOUNTER — Encounter: Payer: Self-pay | Admitting: Pulmonary Disease

## 2013-03-24 ENCOUNTER — Telehealth: Payer: Self-pay | Admitting: Pulmonary Disease

## 2013-03-24 NOTE — Telephone Encounter (Signed)
Called and spoke with pt. She reports she never received a call from medical records (see phone note 03/23/13). She called them herself this AM and was advised they had no information on her and she should contact medical records manager. Pt reports she is sick of this and this is not the first time this has happened. She reports she is going to find her another pulmonologist's in [place]. She reports she has also had times when she was suppose to have tests done and the ball was dropped. She reports we have her on aerospan and wants to know what she needs to do to come off this medication? She states this is the only medication she gets from [name]. She advised her manager she is not going to do pulm rehab as suggested by [name]. Please advise dr. Lake Bells thanks

## 2013-03-24 NOTE — Telephone Encounter (Signed)
Malachy Mood has also spoken w/ pt. She is requesting a copy of the order we placed for her pulm rehab. She wants this mailed to her. She still wants to know what she should do regarding her Suzanne Spears as she is not coming back here to be seen anymore. Can she just stop this medication? Please advise Dr. Lake Bells thanks

## 2013-03-24 NOTE — Telephone Encounter (Signed)
I also called MR and spoke with Sharyn Lull. Hoyle Sauer is gone for the day and will be back in tomorrow. She will leave a message for carolyn to call pt tomorrow. I advised them pt is not happy bc she feels she is getting the run around. Hoyle Sauer is the one who handles FMLA so will need to check with her tomorrow. Will call carolyn tomorrow to see where pt FMLA paperwork is.

## 2013-03-25 NOTE — Telephone Encounter (Signed)
The only way for me to know if she can come off the Jeralyn Ruths is for her to be seen by me I haven't seen her since I started the medicine in June 2014, so I really can't say if it is adding much or not. Please offer her an office visit and instruct her that if she does stop the [place] she may experience increasing shortness of breath.  Otherwise she can discuss with her PCP or a new pulmonologist if she doesn't want to see me.  Notably, her complaint in August for her no-show was that she never received a reminder call from our office, so not really her fault.  However, she never rescheduled after that  From Epic it looks like [facility] faxed over the pulm rehab referral, maybe the ball was dropped on the [facility] end?

## 2013-03-25 NOTE — Telephone Encounter (Signed)
[email or web address].Jennifer Castillo, CMA  

## 2013-03-28 ENCOUNTER — Telehealth: Payer: Self-pay | Admitting: Internal Medicine

## 2013-03-28 ENCOUNTER — Telehealth: Payer: Self-pay | Admitting: Pulmonary Disease

## 2013-03-28 NOTE — Telephone Encounter (Signed)
Pt states she is returning call.  Suzanne Spears ° °

## 2013-03-28 NOTE — Telephone Encounter (Signed)
This is a duplicate message. I will close this and continue in old message. Clarke Bing, CMA

## 2013-03-28 NOTE — Telephone Encounter (Signed)
LMTCB x2  

## 2013-03-28 NOTE — Telephone Encounter (Signed)
Pt advised and she states she will go back to the pulmonologist that she used to see because she is more comfortable with them.[place] Bing, CMA

## 2013-03-28 NOTE — Telephone Encounter (Signed)
Pt dropped off paperwork to be filled out by Dr. Derrel Nip.  Placed in Dr. Lupita Dawn box.  Contact pt when ready for pick up.  May leave msg.

## 2013-03-31 ENCOUNTER — Telehealth: Payer: Self-pay | Admitting: Internal Medicine

## 2013-03-31 ENCOUNTER — Ambulatory Visit: Payer: BC Managed Care – PPO | Admitting: Pulmonary Disease

## 2013-03-31 DIAGNOSIS — I420 Dilated cardiomyopathy: Secondary | ICD-10-CM

## 2013-03-31 NOTE — Assessment & Plan Note (Signed)
Reports this is ongoing since the last time she had a viral gastroenteritis. Will check TSH to r/o other origin for fatigue. Will also check B12.

## 2013-03-31 NOTE — Telephone Encounter (Signed)
I have not seen Suzanne Spears since August.  She dropped off FMLA forms on Feb 2 .  I have bill ill with the flu but I have filled out  Her FMLA forms  without an office visit but there will be a charge for the 30 minutes I spent researching the issue ( $120)

## 2013-04-04 NOTE — Telephone Encounter (Signed)
Copy of paper work sent to scan and billing kept copy until copy scan to chart. Patient notified to pick up original.

## 2013-04-08 DIAGNOSIS — G8929 Other chronic pain: Secondary | ICD-10-CM | POA: Insufficient documentation

## 2013-05-11 ENCOUNTER — Encounter: Payer: BC Managed Care – PPO | Admitting: Internal Medicine

## 2013-05-12 ENCOUNTER — Ambulatory Visit (INDEPENDENT_AMBULATORY_CARE_PROVIDER_SITE_OTHER): Payer: BC Managed Care – PPO | Admitting: Internal Medicine

## 2013-05-12 ENCOUNTER — Encounter: Payer: Self-pay | Admitting: Internal Medicine

## 2013-05-12 VITALS — BP 118/78 | HR 89 | Temp 98.5°F | Resp 16 | Ht 66.25 in | Wt 161.5 lb

## 2013-05-12 DIAGNOSIS — N941 Unspecified dyspareunia: Secondary | ICD-10-CM

## 2013-05-12 DIAGNOSIS — R5381 Other malaise: Secondary | ICD-10-CM

## 2013-05-12 DIAGNOSIS — R5383 Other fatigue: Secondary | ICD-10-CM

## 2013-05-12 DIAGNOSIS — G43909 Migraine, unspecified, not intractable, without status migrainosus: Secondary | ICD-10-CM

## 2013-05-12 DIAGNOSIS — I059 Rheumatic mitral valve disease, unspecified: Secondary | ICD-10-CM

## 2013-05-12 DIAGNOSIS — Z789 Other specified health status: Secondary | ICD-10-CM | POA: Insufficient documentation

## 2013-05-12 DIAGNOSIS — I428 Other cardiomyopathies: Secondary | ICD-10-CM

## 2013-05-12 DIAGNOSIS — IMO0002 Reserved for concepts with insufficient information to code with codable children: Secondary | ICD-10-CM

## 2013-05-12 DIAGNOSIS — I341 Nonrheumatic mitral (valve) prolapse: Secondary | ICD-10-CM

## 2013-05-12 DIAGNOSIS — Z0000 Encounter for general adult medical examination without abnormal findings: Secondary | ICD-10-CM

## 2013-05-12 DIAGNOSIS — I420 Dilated cardiomyopathy: Secondary | ICD-10-CM

## 2013-05-12 MED ORDER — TOPIRAMATE 50 MG PO TABS: 50.0000 mg | ORAL_TABLET | Freq: Every day | ORAL | Status: DC

## 2013-05-12 NOTE — Progress Notes (Signed)
Patient ID: Suzanne Spears, female   DOB: 09/06/1960, 53 y.o.   MRN: [id number]   Subjective:     Suzanne Spears is a 53 y.o. female and is here for a comprehensive physical exam. The patient reports problems - as outlined below..   She had a recent cardiology evaluation by Dr Nehemiah Massed with stress test and ECHO that she understood were normal.  The ECHO was reviewed with her today and noted decreased EF(30%)  and mild to moderate mitral regurgitation.  ACE inhibitor was dicussed today,  Per Dr Alveria Apley note, adding it was considered but not done due to low bp and need for beta blocker to control frequent palpitations and manage cardiomyopathy .  Patient feels generally well but is concerned about her ability to have a vigorous sexual relationship with her now retired from [employer] husband due ot vaginal dryness and stenosis from menopause and lack of use.      History   Social History  . Marital Status: Married    Spouse Name: N/A    Number of Children: N/A  . Years of Education: N/A   Occupational History  . Not on file.   Social History Main Topics  . Smoking status: Never Smoker   . Smokeless tobacco: Never Used  . Alcohol Use: Yes     Comment: socially  . Drug Use: No  . Sexual Activity: Not on file   Other Topics Concern  . Not on file   Social History Narrative  . No narrative on file   Health Maintenance  Topic Date Due  . Tetanus/tdap  05/28/1980  . Influenza Vaccine  09/24/2013  . Mammogram  04/07/2014  . Pap Smear  05/05/2015  . Colonoscopy  12/19/2020    The following portions of the patient's history were reviewed and updated as appropriate: allergies, current medications, past family history, past medical history, past social history, past surgical history and problem list.  Review of Systems A comprehensive review of systems was negative.   Objective:   BP 118/78  Pulse 89  Temp(Src) 98.5 F (36.9 C) (Oral)  Resp 16  Ht 5' 6.25" (1.683  m)  Wt 161 lb 8 oz (73.256 kg)  BMI 25.86 kg/m2  SpO2 99%  LMP 04/20/2013  General Appearance:    Alert, cooperative, no distress, appears stated age  Head:    Normocephalic, without obvious abnormality, atraumatic  Eyes:    PERRL, conjunctiva/corneas clear, EOM's intact, fundi    benign, both eyes  Ears:    Normal TM's and external ear canals, both ears  Nose:   Nares normal, septum midline, mucosa normal, no drainage    or sinus tenderness  Throat:   Lips, mucosa, and tongue normal; teeth and gums normal  Neck:   Supple, symmetrical, trachea midline, no adenopathy;    thyroid:  no enlargement/tenderness/nodules; no carotid   bruit or JVD  Back:     Symmetric, no curvature, ROM normal, no CVA tenderness  Lungs:     Clear to auscultation bilaterally, respirations unlabored  Chest Wall:    No tenderness or deformity   Heart:    Regular rate and rhythm, S1 and S2 normal, no murmur, rub   or gallop  Breast Exam:    No tenderness, masses, or nipple abnormality  Abdomen:     Soft, non-tender, bowel sounds active all four quadrants,    no masses, no organomegaly  Genitalia:    Normal female without lesion, discharge or tenderness  Extremities:   Extremities normal, atraumatic, no cyanosis or edema  Pulses:   2+ and symmetric all extremities  Skin:   Skin color, texture, turgor normal, no rashes or lesions  Lymph nodes:   Cervical, supraclavicular, and axillary nodes normal  Neurologic:   CNII-XII intact, normal strength, sensation and reflexes    throughout    Assessment and Plan:      Dyspareunia, female Long discussion about options including vaginal estrogen, nonhormonal preparations  And dilators  Routine general medical examination at a health care facility Annual comprehensive exam was done including breast, pelvic without PAP smear. All screenings have been addressed .   Other malaise and fatigue multifactoriial secondary to dilated nonischemic cardiomyopathy and B12  deficiency without anemia.  Lab Results  Component Value Date   HGB 13.0 05/13/2013     Mitral valve prolapse syndrome With moderate MR noted on recent ECHO   Cardiomyopathy, dilated, nonischemic She has had cardiopulmonary rehab in the past and currently is working full time and does not exercise regularly.    Updated Medication List Outpatient Encounter Prescriptions as of 05/12/2013  Medication Sig  . albuterol (PROVENTIL HFA;VENTOLIN HFA) 108 (90 BASE) MCG/ACT inhaler Inhale 2 puffs into the lungs daily as needed for wheezing or shortness of breath.  . cyanocobalamin (,VITAMIN B-12,) 1000 MCG/ML injection Inject 1,000 mcg into the muscle every 30 (thirty) days. Last injection September 26, 2012  . Cyanocobalamin (VITAMIN B-12) 5000 MCG SUBL Place 5,000 mcg under the tongue at bedtime.  . diazepam (VALIUM) 5 MG tablet Take 1 tablet (5 mg total) by mouth every 6 (six) hours as needed.  . DULoxetine (CYMBALTA) 60 MG capsule Take 1 capsule (60 mg total) by mouth daily.  . fexofenadine (ALLEGRA) 180 MG tablet Take 180 mg by mouth at bedtime.  . fluticasone (FLONASE) 50 MCG/ACT nasal spray Place 2 sprays into the nose at bedtime.  . magnesium 30 MG tablet Take 30 mg by mouth daily.  . montelukast (SINGULAIR) 10 MG tablet Take 1 tablet (10 mg total) by mouth at bedtime.  . norethindrone-ethinyl estradiol (GILDESS FE 1/20) 1-20 MG-MCG tablet Take 1 tablet by mouth daily.  Marland Kitchen oxyCODONE-acetaminophen (PERCOCET) 10-325 MG per tablet Take 1 tablet by mouth every 4 (four) hours as needed for pain.  Marland Kitchen topiramate (TOPAMAX) 50 MG tablet Take 1 tablet (50 mg total) by mouth at bedtime.  . [DISCONTINUED] topiramate (TOPAMAX) 50 MG tablet Take 150 mg by mouth at bedtime.   . docusate sodium 100 MG CAPS Take 100 mg by mouth 2 (two) times daily.  . Flunisolide HFA (AEROSPAN) 80 MCG/ACT AERS Inhale 1 puff into the lungs 2 (two) times daily.

## 2013-05-12 NOTE — Patient Instructions (Signed)
I recommend a trial of [place] 's vaginal lubricant  in suppository form  Alternative non hormonal lubricants include Astroglide  The website [email or web address].com reportedly has dilators that can help with increasing your vaginal diameter.  We can always try vaginal estrogen cream if the lubricants fail to provide enough relief, (Premarin, estrace,  Or vagifem tablets)

## 2013-05-13 ENCOUNTER — Other Ambulatory Visit (INDEPENDENT_AMBULATORY_CARE_PROVIDER_SITE_OTHER): Payer: BC Managed Care – PPO

## 2013-05-13 DIAGNOSIS — Z789 Other specified health status: Secondary | ICD-10-CM

## 2013-05-13 DIAGNOSIS — R5381 Other malaise: Secondary | ICD-10-CM

## 2013-05-13 DIAGNOSIS — Z0000 Encounter for general adult medical examination without abnormal findings: Secondary | ICD-10-CM

## 2013-05-13 DIAGNOSIS — R5383 Other fatigue: Principal | ICD-10-CM

## 2013-05-13 LAB — CBC WITH DIFFERENTIAL/PLATELET
Basophils Absolute: 0 10*3/uL (ref 0.0–0.1)
Basophils Relative: 0.6 % (ref 0.0–3.0)
Eosinophils Absolute: 0.1 10*3/uL (ref 0.0–0.7)
Eosinophils Relative: 1.2 % (ref 0.0–5.0)
HCT: 39.5 % (ref 36.0–46.0)
Hemoglobin: 13 g/dL (ref 12.0–15.0)
Lymphocytes Relative: 31.4 % (ref 12.0–46.0)
Lymphs Abs: 1.6 10*3/uL (ref 0.7–4.0)
MCHC: 32.9 g/dL (ref 30.0–36.0)
MCV: 93.4 fl (ref 78.0–100.0)
Monocytes Absolute: 0.4 10*3/uL (ref 0.1–1.0)
Monocytes Relative: 8.6 % (ref 3.0–12.0)
Neutro Abs: 3 10*3/uL (ref 1.4–7.7)
Neutrophils Relative %: 58.2 % (ref 43.0–77.0)
Platelets: 241 10*3/uL (ref 150.0–400.0)
RBC: 4.24 Mil/uL (ref 3.87–5.11)
RDW: 13.9 % (ref 11.5–14.6)
WBC: 5.2 10*3/uL (ref 4.5–10.5)

## 2013-05-13 LAB — COMPREHENSIVE METABOLIC PANEL
ALT: 11 U/L (ref 0–35)
AST: 13 U/L (ref 0–37)
Albumin: 3.6 g/dL (ref 3.5–5.2)
Alkaline Phosphatase: 67 U/L (ref 39–117)
BUN: 14 mg/dL (ref 6–23)
CO2: 24 mEq/L (ref 19–32)
Calcium: 9.3 mg/dL (ref 8.4–10.5)
Chloride: 108 mEq/L (ref 96–112)
Creatinine, Ser: 1.1 mg/dL (ref 0.4–1.2)
GFR: 54.87 mL/min — ABNORMAL LOW (ref >60.00)
Glucose, Bld: 84 mg/dL (ref 70–99)
Potassium: 3.7 mEq/L (ref 3.5–5.1)
Sodium: 140 mEq/L (ref 135–145)
Total Bilirubin: 0.6 mg/dL (ref 0.3–1.2)
Total Protein: 6.8 g/dL (ref 6.0–8.3)

## 2013-05-13 LAB — TSH: TSH: 0.76 u[IU]/mL (ref 0.35–5.50)

## 2013-05-15 ENCOUNTER — Encounter: Payer: Self-pay | Admitting: Internal Medicine

## 2013-05-15 NOTE — Assessment & Plan Note (Addendum)
multifactoriial secondary to dilated nonischemic cardiomyopathy and B12 deficiency without anemia.  Lab Results  Component Value Date   HGB 13.0 05/13/2013

## 2013-05-15 NOTE — Assessment & Plan Note (Signed)
She has had cardiopulmonary rehab in the past and currently is working full time and does not exercise regularly.

## 2013-05-15 NOTE — Assessment & Plan Note (Signed)
Long discussion about options including vaginal estrogen, nonhormonal preparations  And dilators

## 2013-05-15 NOTE — Assessment & Plan Note (Addendum)
Annual comprehensive exam was done including breast, pelvic without PAP smear. All screenings have been addressed .  

## 2013-05-15 NOTE — Assessment & Plan Note (Signed)
With moderate MR noted on recent ECHO

## 2013-05-18 ENCOUNTER — Telehealth: Payer: Self-pay | Admitting: *Deleted

## 2013-05-18 MED ORDER — NORETHIN ACE-ETH ESTRAD-FE 1-20 MG-MCG PO TABS: 1.0000 | ORAL_TABLET | Freq: Every day | ORAL | Status: DC

## 2013-05-18 NOTE — Telephone Encounter (Signed)
Refill sent as requested. 

## 2013-05-18 NOTE — Telephone Encounter (Signed)
Refill Request  [place] FE 1-20   Take one tablet by mouth once daily

## 2013-05-30 ENCOUNTER — Other Ambulatory Visit: Payer: Self-pay | Admitting: *Deleted

## 2013-05-30 MED ORDER — DULOXETINE HCL 60 MG PO CPEP: 60.0000 mg | ORAL_CAPSULE | Freq: Every day | ORAL | Status: DC

## 2013-06-08 ENCOUNTER — Encounter: Payer: Self-pay | Admitting: Internal Medicine

## 2013-06-10 ENCOUNTER — Encounter: Payer: Self-pay | Admitting: Adult Health

## 2013-06-10 ENCOUNTER — Ambulatory Visit (INDEPENDENT_AMBULATORY_CARE_PROVIDER_SITE_OTHER): Payer: BC Managed Care – PPO | Admitting: Adult Health

## 2013-06-10 VITALS — BP 112/74 | HR 76 | Temp 98.0°F | Resp 14 | Wt 167.0 lb

## 2013-06-10 DIAGNOSIS — R209 Unspecified disturbances of skin sensation: Secondary | ICD-10-CM

## 2013-06-10 DIAGNOSIS — R002 Palpitations: Secondary | ICD-10-CM

## 2013-06-10 DIAGNOSIS — R200 Anesthesia of skin: Secondary | ICD-10-CM

## 2013-06-10 DIAGNOSIS — R202 Paresthesia of skin: Principal | ICD-10-CM

## 2013-06-10 LAB — VITAMIN B12: Vitamin B-12: 615 pg/mL (ref 211–911)

## 2013-06-10 LAB — T3, FREE: T3, Free: 3.2 pg/mL (ref 2.3–4.2)

## 2013-06-10 LAB — BASIC METABOLIC PANEL
BUN: 18 mg/dL (ref 6–23)
CO2: 25 mEq/L (ref 19–32)
Calcium: 9 mg/dL (ref 8.4–10.5)
Chloride: 111 mEq/L (ref 96–112)
Creatinine, Ser: 1 mg/dL (ref 0.4–1.2)
GFR: 65.65 mL/min (ref >60.00)
Glucose, Bld: 74 mg/dL (ref 70–99)
Potassium: 4.3 mEq/L (ref 3.5–5.1)
Sodium: 141 mEq/L (ref 135–145)

## 2013-06-10 LAB — LUTEINIZING HORMONE: LH: 0.01 m[IU]/mL

## 2013-06-10 LAB — FOLLICLE STIMULATING HORMONE: FSH: 0.2 m[IU]/mL

## 2013-06-10 LAB — T4, FREE: Free T4: 0.88 ng/dL (ref 0.60–1.60)

## 2013-06-10 LAB — MAGNESIUM: Magnesium: 1.8 mg/dL (ref 1.5–2.5)

## 2013-06-10 NOTE — Progress Notes (Signed)
Pre visit review using our clinic review tool, if applicable. No additional management support is needed unless otherwise documented below in the visit note. 
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
## 2013-06-11 ENCOUNTER — Encounter: Payer: Self-pay | Admitting: Adult Health

## 2013-06-13 ENCOUNTER — Other Ambulatory Visit: Payer: Self-pay | Admitting: *Deleted

## 2013-06-13 MED ORDER — CYANOCOBALAMIN 1000 MCG/ML IJ SOLN: 1000.0000 ug | INTRAMUSCULAR | Status: DC

## 2013-06-14 ENCOUNTER — Encounter: Payer: Self-pay | Admitting: Internal Medicine

## 2013-06-14 ENCOUNTER — Ambulatory Visit (INDEPENDENT_AMBULATORY_CARE_PROVIDER_SITE_OTHER): Payer: BC Managed Care – PPO | Admitting: Internal Medicine

## 2013-06-14 VITALS — BP 120/70 | HR 90 | Temp 98.5°F | Resp 16 | Wt 166.5 lb

## 2013-06-14 DIAGNOSIS — I059 Rheumatic mitral valve disease, unspecified: Secondary | ICD-10-CM

## 2013-06-14 DIAGNOSIS — R5383 Other fatigue: Secondary | ICD-10-CM

## 2013-06-14 DIAGNOSIS — G609 Hereditary and idiopathic neuropathy, unspecified: Secondary | ICD-10-CM

## 2013-06-14 DIAGNOSIS — M791 Myalgia, unspecified site: Secondary | ICD-10-CM

## 2013-06-14 DIAGNOSIS — I341 Nonrheumatic mitral (valve) prolapse: Secondary | ICD-10-CM

## 2013-06-14 DIAGNOSIS — IMO0001 Reserved for inherently not codable concepts without codable children: Secondary | ICD-10-CM

## 2013-06-14 DIAGNOSIS — R5381 Other malaise: Secondary | ICD-10-CM

## 2013-06-14 DIAGNOSIS — E538 Deficiency of other specified B group vitamins: Secondary | ICD-10-CM

## 2013-06-14 DIAGNOSIS — R609 Edema, unspecified: Secondary | ICD-10-CM

## 2013-06-14 LAB — CK: Total CK: 103 U/L (ref 7–177)

## 2013-06-14 LAB — SEDIMENTATION RATE: Sed Rate: 20 mm/hr (ref 0–22)

## 2013-06-14 LAB — BRAIN NATRIURETIC PEPTIDE: Pro B Natriuretic peptide (BNP): 51 pg/mL (ref 0.0–100.0)

## 2013-06-14 LAB — FERRITIN: Ferritin: 21.1 ng/mL (ref 10.0–291.0)

## 2013-06-14 MED ORDER — PROPRANOLOL HCL 20 MG PO TABS: 20.0000 mg | ORAL_TABLET | Freq: Three times a day (TID) | ORAL | Status: DC

## 2013-06-14 MED ORDER — SPIRONOLACTONE 25 MG PO TABS: 25.0000 mg | ORAL_TABLET | Freq: Every day | ORAL | Status: DC
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
## 2013-06-14 NOTE — Assessment & Plan Note (Signed)
Secondary to cardiomyopathy with systolic dysfunction.  Trial of spironolactone.

## 2013-06-14 NOTE — Assessment & Plan Note (Signed)
Managed with B12 injections  

## 2013-06-14 NOTE — Progress Notes (Signed)
Pre-visit discussion using our clinic review tool. No additional management support is needed unless otherwise documented below in the visit note.  

## 2013-06-14 NOTE — Assessment & Plan Note (Signed)
Sleep apnea was ruled out in July with sleep study ,  She is not anemic, but has a cardiomyopathy with EF 30% by recent ECHO.  Will consider referral to cardiopulmonary rehabilitation.

## 2013-06-14 NOTE — Patient Instructions (Addendum)
We discussed resuming cymbalta for your chronic pain  Starting  a low dose of spironolactone for the fluid retention  You do need to have nerve conduction studies by Dr Manuella Ghazi so I will set that up for you  You can use propranalol for the next episode of palpitations

## 2013-06-14 NOTE — Assessment & Plan Note (Signed)
She was found to be B12 deficient last year, which has been replaced.  Folic acid level was ordered in 2013 but not done and will need to be done .  RPR is pending,  And advised to go back to Dr Manuella Ghazi for nerve conduction studies. Resume cymbalta,  Discussed trial of lyrica if use of cymbalta did not relieve discomfort.

## 2013-06-14 NOTE — Assessment & Plan Note (Signed)
With moderate MR noted on recent ECHO and recurrent palpitations,  Did not tolerate metoprolol, but willing to try inderal if palpitations recur.

## 2013-06-15 LAB — IRON AND TIBC
%SAT: 22 % (ref 20–55)
Iron: 99 ug/dL (ref 42–145)
TIBC: 447 ug/dL (ref 250–470)
UIBC: 348 ug/dL (ref 125–400)

## 2013-06-15 LAB — RPR

## 2013-06-16 ENCOUNTER — Encounter: Payer: Self-pay | Admitting: Internal Medicine

## 2013-06-16 ENCOUNTER — Encounter: Payer: Self-pay | Admitting: Emergency Medicine

## 2013-06-16 NOTE — Telephone Encounter (Signed)
Please advise 

## 2013-07-03 ENCOUNTER — Telehealth: Payer: Self-pay | Admitting: Internal Medicine

## 2013-07-03 DIAGNOSIS — G609 Hereditary and idiopathic neuropathy, unspecified: Secondary | ICD-10-CM

## 2013-07-03 NOTE — Telephone Encounter (Signed)
There was no evidence of neuropathy by EMG/nerve conduction studies done May 4 by Dr. Brigitte Pulse.  She can continue the cymbalta if it is helping

## 2013-07-03 NOTE — Assessment & Plan Note (Addendum)
There was no evidence of neuropathy by EMG/nerve conduction studies done May 4 by Dr. Shaw.  cymbalta was prescribed at prior visit.    

## 2013-07-04 ENCOUNTER — Encounter: Payer: Self-pay | Admitting: *Deleted

## 2013-07-04 NOTE — Telephone Encounter (Signed)
Detailed letter mailed

## 2013-07-26 ENCOUNTER — Ambulatory Visit: Payer: Self-pay | Admitting: Internal Medicine

## 2013-08-12 ENCOUNTER — Encounter: Payer: Self-pay | Admitting: Internal Medicine

## 2013-10-03 ENCOUNTER — Other Ambulatory Visit: Payer: Self-pay | Admitting: *Deleted

## 2013-10-03 MED ORDER — SPIRONOLACTONE 25 MG PO TABS: 25.0000 mg | ORAL_TABLET | Freq: Every day | ORAL | Status: DC

## 2013-10-13 ENCOUNTER — Other Ambulatory Visit: Payer: Self-pay | Admitting: *Deleted

## 2013-10-13 MED ORDER — CYANOCOBALAMIN 1000 MCG/ML IJ SOLN: 1000.0000 ug | INTRAMUSCULAR | Status: DC

## 2013-11-03 ENCOUNTER — Telehealth: Payer: Self-pay | Admitting: *Deleted

## 2013-11-03 MED ORDER — VALACYCLOVIR HCL 1 G PO TABS: 1000.0000 mg | ORAL_TABLET | Freq: Two times a day (BID) | ORAL | Status: DC

## 2013-11-03 NOTE — Telephone Encounter (Signed)
Pt.notified

## 2013-11-03 NOTE — Telephone Encounter (Signed)
Pt left VM, requesting refill of Valtrex, having fever blisters. Not on med list

## 2013-11-03 NOTE — Telephone Encounter (Signed)
Valtrex rx sent to [facility]

## 2013-11-08 ENCOUNTER — Other Ambulatory Visit: Payer: Self-pay | Admitting: *Deleted

## 2013-11-09 ENCOUNTER — Other Ambulatory Visit: Payer: Self-pay | Admitting: *Deleted

## 2013-11-23 ENCOUNTER — Other Ambulatory Visit: Payer: Self-pay | Admitting: *Deleted

## 2013-11-23 MED ORDER — NORETHIN ACE-ETH ESTRAD-FE 1-20 MG-MCG PO TABS: 1.0000 | ORAL_TABLET | Freq: Every day | ORAL | Status: DC

## 2013-12-02 ENCOUNTER — Encounter: Payer: Self-pay | Admitting: Internal Medicine

## 2013-12-02 ENCOUNTER — Ambulatory Visit (INDEPENDENT_AMBULATORY_CARE_PROVIDER_SITE_OTHER): Payer: BC Managed Care – PPO | Admitting: Internal Medicine

## 2013-12-02 VITALS — BP 124/72 | HR 93 | Temp 98.1°F | Resp 16 | Ht 66.25 in | Wt 154.8 lb

## 2013-12-02 DIAGNOSIS — R5382 Chronic fatigue, unspecified: Secondary | ICD-10-CM

## 2013-12-02 DIAGNOSIS — E559 Vitamin D deficiency, unspecified: Secondary | ICD-10-CM

## 2013-12-02 DIAGNOSIS — E538 Deficiency of other specified B group vitamins: Secondary | ICD-10-CM

## 2013-12-02 DIAGNOSIS — R944 Abnormal results of kidney function studies: Secondary | ICD-10-CM

## 2013-12-02 DIAGNOSIS — N179 Acute kidney failure, unspecified: Secondary | ICD-10-CM

## 2013-12-02 DIAGNOSIS — E785 Hyperlipidemia, unspecified: Secondary | ICD-10-CM

## 2013-12-02 DIAGNOSIS — R197 Diarrhea, unspecified: Secondary | ICD-10-CM

## 2013-12-02 MED ORDER — CYANOCOBALAMIN 1000 MCG/ML IJ SOLN: 1000.0000 ug | INTRAMUSCULAR | Status: DC
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
## 2013-12-02 NOTE — Progress Notes (Signed)
Pre-visit discussion using our clinic review tool. No additional management support is needed unless otherwise documented below in the visit note.  

## 2013-12-02 NOTE — Patient Instructions (Signed)
We will repeat your kidney function,,  B12 magnesium,  And thyroid  Next week  Avoid anti inflammatories for now ,  try using Salon Pas on the chest wall.  Try ice first.   tyr increasing the b12 to weekly to see it helps the energy level   Congratulations on the weight loss!!

## 2013-12-04 ENCOUNTER — Encounter: Payer: Self-pay | Admitting: Internal Medicine

## 2013-12-04 DIAGNOSIS — R944 Abnormal results of kidney function studies: Secondary | ICD-10-CM | POA: Insufficient documentation

## 2013-12-04 NOTE — Assessment & Plan Note (Signed)
Advised to increase monthly doses to weekly if it improves her fatigue.

## 2013-12-04 NOTE — Assessment & Plan Note (Signed)
Stopping spironolactone,  Repeat GFR next week. Reassurance provided.

## 2013-12-05 ENCOUNTER — Telehealth: Payer: Self-pay | Admitting: Internal Medicine

## 2013-12-05 NOTE — Telephone Encounter (Signed)
She can take immodium available OTC andd follow a clear liquid diet,

## 2013-12-05 NOTE — Telephone Encounter (Signed)
Spoke with pt advised of MDs message.  Pt verbalized understanding. 

## 2013-12-05 NOTE — Telephone Encounter (Signed)
Patient Information:  Caller Name: Victoriah  Phone: [phone]  Patient: Tinesha, Siegrist  Gender: Female  DOB: 1960-07-23  Age: 53 Years  PCP: Deborra Medina (Adults only)  Office Follow Up:  Does the office need to follow up with this patient?: Yes  Instructions For The Office: Please review, requesting Rx recommendation OTC or prescription sent to pharmacy. Please contact patient at work [phone].   Symptoms  Reason For Call & Symptoms: Diarrhea started on Saturday 10/3.  Saw Dr Derrel Nip on Friday 10/9 and advised bland foods which she started.  Has felt cold at times but afebrile.  Has had 6+ stools in the last 24 hours, 3 of them already today Monday 10/12.  Diarrhea now fluid, barely making it to bathroom.  Cramping before stool, lessens afterward.  Feeling run down and does not feel she can continue to work like this.  Reviewed Health History In EMR: Yes  Reviewed Medications In EMR: Yes  Reviewed Allergies In EMR: Yes  Reviewed Surgeries / Procedures: Yes  Date of Onset of Symptoms: 11/26/2013  Treatments Tried: bland diet  Treatments Tried Worked: No  Guideline(s) Used:  Diarrhea  Disposition Per Guideline:   See Within 3 Days in Office  Reason For Disposition Reached:   Diarrhea persists > 7 days  Advice Given:  N/A  Patient Refused Recommendation:  Patient Requests Prescription  Requesting either Rx to be called to [facility] on [address] or instructions on what to take OTC to stop the diarrhea.  Can reach patient at work  [phone].

## 2013-12-08 ENCOUNTER — Other Ambulatory Visit (INDEPENDENT_AMBULATORY_CARE_PROVIDER_SITE_OTHER): Payer: BC Managed Care – PPO

## 2013-12-08 DIAGNOSIS — G609 Hereditary and idiopathic neuropathy, unspecified: Secondary | ICD-10-CM

## 2013-12-08 DIAGNOSIS — E785 Hyperlipidemia, unspecified: Secondary | ICD-10-CM

## 2013-12-08 DIAGNOSIS — N179 Acute kidney failure, unspecified: Secondary | ICD-10-CM

## 2013-12-08 DIAGNOSIS — R5382 Chronic fatigue, unspecified: Secondary | ICD-10-CM

## 2013-12-08 DIAGNOSIS — E559 Vitamin D deficiency, unspecified: Secondary | ICD-10-CM

## 2013-12-08 DIAGNOSIS — R197 Diarrhea, unspecified: Secondary | ICD-10-CM

## 2013-12-08 NOTE — Addendum Note (Signed)
Addended by: Karlene Einstein D on: 12/08/2013 08:23 AM   Modules accepted: Orders

## 2013-12-09 LAB — COMPREHENSIVE METABOLIC PANEL
ALT: 17 U/L (ref 0–35)
AST: 27 U/L (ref 0–37)
Albumin: 3.4 g/dL — ABNORMAL LOW (ref 3.5–5.2)
Alkaline Phosphatase: 63 U/L (ref 39–117)
BUN: 14 mg/dL (ref 6–23)
CO2: 22 mEq/L (ref 19–32)
Calcium: 9 mg/dL (ref 8.4–10.5)
Chloride: 111 mEq/L (ref 96–112)
Creatinine, Ser: 1.2 mg/dL (ref 0.4–1.2)
GFR: 52.56 mL/min — ABNORMAL LOW (ref >60.00)
Glucose, Bld: 85 mg/dL (ref 70–99)
Potassium: 4.1 mEq/L (ref 3.5–5.1)
Sodium: 139 mEq/L (ref 135–145)
Total Bilirubin: 0.6 mg/dL (ref 0.2–1.2)
Total Protein: 7 g/dL (ref 6.0–8.3)

## 2013-12-09 LAB — LIPID PANEL
Cholesterol: 246 mg/dL — ABNORMAL HIGH (ref 0–200)
HDL: 39.5 mg/dL (ref >39.00)
LDL Cholesterol: 181 mg/dL — ABNORMAL HIGH (ref 0–99)
NonHDL: 206.5
Total CHOL/HDL Ratio: 6
Triglycerides: 126 mg/dL (ref 0.0–149.0)
VLDL: 25.2 mg/dL (ref 0.0–40.0)

## 2013-12-09 LAB — FOLATE RBC: RBC Folate: 712 ng/mL (ref >280)

## 2013-12-09 LAB — MAGNESIUM: Magnesium: 2.2 mg/dL (ref 1.5–2.5)

## 2013-12-09 LAB — TSH: TSH: 1.08 u[IU]/mL (ref 0.35–4.50)

## 2013-12-09 LAB — VITAMIN D 25 HYDROXY (VIT D DEFICIENCY, FRACTURES): VITD: 43.92 ng/mL (ref 30.00–100.00)

## 2013-12-11 ENCOUNTER — Encounter: Payer: Self-pay | Admitting: Internal Medicine

## 2013-12-11 NOTE — Addendum Note (Signed)
Addended by: [name] on: 12/11/2013 08:16 PM   Modules accepted: Orders

## 2013-12-14 ENCOUNTER — Telehealth: Payer: Self-pay | Admitting: *Deleted

## 2013-12-14 ENCOUNTER — Ambulatory Visit: Payer: BC Managed Care – PPO | Admitting: Family Medicine

## 2013-12-14 DIAGNOSIS — Z0289 Encounter for other administrative examinations: Secondary | ICD-10-CM

## 2013-12-14 NOTE — Telephone Encounter (Signed)
See below

## 2013-12-14 NOTE — Telephone Encounter (Signed)
Pt needs $50 fee

## 2013-12-14 NOTE — Telephone Encounter (Signed)
Pt did not show for appointment 12/14/2013 at 1:00pm for cough

## 2013-12-27 ENCOUNTER — Ambulatory Visit (INDEPENDENT_AMBULATORY_CARE_PROVIDER_SITE_OTHER): Payer: BC Managed Care – PPO | Admitting: Internal Medicine

## 2013-12-27 ENCOUNTER — Encounter: Payer: Self-pay | Admitting: Internal Medicine

## 2013-12-27 VITALS — BP 122/74 | HR 91 | Temp 97.5°F | Resp 16 | Ht 66.25 in | Wt 156.0 lb

## 2013-12-27 DIAGNOSIS — H53139 Sudden visual loss, unspecified eye: Secondary | ICD-10-CM | POA: Insufficient documentation

## 2013-12-27 DIAGNOSIS — H53132 Sudden visual loss, left eye: Secondary | ICD-10-CM

## 2013-12-27 NOTE — Assessment & Plan Note (Addendum)
Recurrent episodes for the last 5 days,  Now with  Pain in left orbit and behind eye.   Corrected vision is 20/25 in left and 20/30 in right.  Optic exam appears normal.  Urgent referral to [facility] to rule out retinal detachment and acute angle glaucoma
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
## 2013-12-28 ENCOUNTER — Telehealth: Payer: Self-pay | Admitting: Internal Medicine

## 2013-12-28 DIAGNOSIS — H5462 Unqualified visual loss, left eye, normal vision right eye: Secondary | ICD-10-CM

## 2013-12-28 DIAGNOSIS — G453 Amaurosis fugax: Secondary | ICD-10-CM

## 2013-12-28 NOTE — Telephone Encounter (Signed)
Dr. Dawna Part wanted me to work you up for embolic disease .  This does not require another OV with me.  I will set you up for cartoid dopplers and an ECHO through your cardiologist's office.

## 2013-12-28 NOTE — Telephone Encounter (Signed)
Dr. Doneen Poisson would like for you to call him at this number his cell, [phone] nurse stated he needs to speak directly with you.

## 2013-12-28 NOTE — Telephone Encounter (Signed)
Patient notified as requested and voiced understanding, patient reported having some pain to left eye at temple area ask if could take tylenol, advised patient not to take more than 2000 mg in a 24 hour period.

## 2013-12-28 NOTE — Telephone Encounter (Signed)
I have the office note from Dr Dawna Part which should be sent to cardiology along with my office note

## 2014-01-03 ENCOUNTER — Telehealth: Payer: Self-pay | Admitting: Internal Medicine

## 2014-01-03 ENCOUNTER — Encounter: Payer: Self-pay | Admitting: Internal Medicine

## 2014-01-03 NOTE — Telephone Encounter (Signed)
Suzanne Spears   I am not able to access Dr Laurann Montana his latest note or the ECHO i assume he did when he worked her  in, because they are not showing up i EPIC .Marland Kitchenzplease request records ASAP.

## 2014-01-04 NOTE — Telephone Encounter (Signed)
Records in red folder.

## 2014-01-04 NOTE — Telephone Encounter (Signed)
Patient notified and voiced understanding patient stated she appreciated your opinion very much.

## 2014-01-04 NOTE — Telephone Encounter (Signed)
Based on the degree of systolic dysfunction ,  I  Definitely agree with Dr. Raliegh Ip  About proceeding with the defibrillator/pacer and the mitral valve repair or replacement. Both the pacer and the mitral valve repair will improve your exercsie tolerance and your general quality of life.

## 2014-01-04 NOTE — Telephone Encounter (Signed)
Talked with [facility] cardiology and they are having notes and records faxed to office.

## 2014-01-17 ENCOUNTER — Ambulatory Visit: Payer: Self-pay | Admitting: Internal Medicine

## 2014-01-24 ENCOUNTER — Encounter: Payer: Self-pay | Admitting: Internal Medicine

## 2014-02-22 ENCOUNTER — Encounter: Payer: Self-pay | Admitting: Internal Medicine

## 2014-02-24 DIAGNOSIS — J189 Pneumonia, unspecified organism: Secondary | ICD-10-CM

## 2014-02-24 HISTORY — DX: Pneumonia, unspecified organism: J18.9

## 2014-03-02 ENCOUNTER — Encounter: Payer: Self-pay | Admitting: Internal Medicine

## 2014-03-02 ENCOUNTER — Ambulatory Visit (INDEPENDENT_AMBULATORY_CARE_PROVIDER_SITE_OTHER): Payer: BLUE CROSS/BLUE SHIELD | Admitting: Internal Medicine

## 2014-03-02 VITALS — BP 146/102 | HR 91 | Temp 98.2°F | Wt 153.0 lb

## 2014-03-02 DIAGNOSIS — M2550 Pain in unspecified joint: Secondary | ICD-10-CM

## 2014-03-02 DIAGNOSIS — I10 Essential (primary) hypertension: Secondary | ICD-10-CM

## 2014-03-02 DIAGNOSIS — R6889 Other general symptoms and signs: Secondary | ICD-10-CM

## 2014-03-02 LAB — POCT INFLUENZA A/B
Influenza A, POC: NEGATIVE
Influenza B, POC: NEGATIVE

## 2014-03-02 LAB — RHEUMATOID FACTOR: Rheumatoid fact SerPl-aCnc: <10 [IU]/mL (ref <=14)
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
## 2014-03-02 NOTE — Progress Notes (Signed)
Pre visit review using our clinic review tool, if applicable. No additional management support is needed unless otherwise documented below in the visit note. 

## 2014-03-02 NOTE — Patient Instructions (Signed)

## 2014-03-03 ENCOUNTER — Inpatient Hospital Stay: Payer: Self-pay | Admitting: Internal Medicine

## 2014-03-03 LAB — CBC WITH DIFFERENTIAL/PLATELET
Basophil #: 0 10*3/uL (ref 0.0–0.1)
Basophil %: 0.4 %
Eosinophil #: 0 10*3/uL (ref 0.0–0.7)
Eosinophil %: 0.1 %
HCT: 45.2 % (ref 35.0–47.0)
HGB: 14.7 g/dL (ref 12.0–16.0)
Lymphocyte #: 0.8 10*3/uL — ABNORMAL LOW (ref 1.0–3.6)
Lymphocyte %: 7.7 %
MCH: 30.8 pg (ref 26.0–34.0)
MCHC: 32.5 g/dL (ref 32.0–36.0)
MCV: 95 fL (ref 80–100)
Monocyte #: 0.6 x10 3/mm (ref 0.2–0.9)
Monocyte %: 5.7 %
Neutrophil #: 8.7 10*3/uL — ABNORMAL HIGH (ref 1.4–6.5)
Neutrophil %: 86.1 %
Platelet: 207 10*3/uL (ref 150–440)
RBC: 4.76 10*6/uL (ref 3.80–5.20)
RDW: 14.3 % (ref 11.5–14.5)
WBC: 10.1 10*3/uL (ref 3.6–11.0)

## 2014-03-03 LAB — COMPREHENSIVE METABOLIC PANEL
ALT: 21 U/L (ref 0–35)
AST: 22 U/L (ref 0–37)
Albumin: 3.6 g/dL (ref 3.5–5.2)
Albumin: 3.3 g/dL — ABNORMAL LOW (ref 3.4–5.0)
Alkaline Phosphatase: 88 U/L (ref 39–117)
Alkaline Phosphatase: 131 U/L — ABNORMAL HIGH
Anion Gap: 10 (ref 7–16)
BUN: 13 mg/dL (ref 6–23)
BUN: 11 mg/dL (ref 7–18)
Bilirubin,Total: 0.4 mg/dL (ref 0.2–1.0)
CO2: 23 mEq/L (ref 19–32)
Calcium, Total: 8.7 mg/dL (ref 8.5–10.1)
Calcium: 9 mg/dL (ref 8.4–10.5)
Chloride: 111 mEq/L (ref 96–112)
Chloride: 105 mmol/L (ref 98–107)
Co2: 19 mmol/L — ABNORMAL LOW (ref 21–32)
Creatinine, Ser: 1.2 mg/dL (ref 0.4–1.2)
Creatinine: 0.95 mg/dL (ref 0.60–1.30)
EGFR (African American): >60
EGFR (Non-African Amer.): >60
GFR: 49.52 mL/min — ABNORMAL LOW (ref >60.00)
Glucose, Bld: 98 mg/dL (ref 70–99)
Glucose: 87 mg/dL (ref 65–99)
Osmolality: 267 (ref 275–301)
Potassium: 4.1 mEq/L (ref 3.5–5.1)
Potassium: 4.3 mmol/L (ref 3.5–5.1)
SGOT(AST): 71 U/L — ABNORMAL HIGH (ref 15–37)
SGPT (ALT): 48 U/L
Sodium: 140 mEq/L (ref 135–145)
Sodium: 134 mmol/L — ABNORMAL LOW (ref 136–145)
Total Bilirubin: 0.4 mg/dL (ref 0.2–1.2)
Total Protein: 7.3 g/dL (ref 6.0–8.3)
Total Protein: 7.8 g/dL (ref 6.4–8.2)

## 2014-03-03 LAB — ANA: Anti Nuclear Antibody(ANA): NEGATIVE

## 2014-03-03 LAB — CBC
HCT: 43.7 % (ref 36.0–46.0)
Hemoglobin: 14.1 g/dL (ref 12.0–15.0)
MCHC: 32.3 g/dL (ref 30.0–36.0)
MCV: 94.7 fl (ref 78.0–100.0)
Platelets: 235 10*3/uL (ref 150.0–400.0)
RBC: 4.62 Mil/uL (ref 3.87–5.11)
RDW: 14.1 % (ref 11.5–15.5)
WBC: 7.9 10*3/uL (ref 4.0–10.5)

## 2014-03-03 LAB — SEDIMENTATION RATE: Sed Rate: 35 mm/hr — ABNORMAL HIGH (ref 0–22)

## 2014-03-03 LAB — TROPONIN I: Troponin-I: <0.02 ng/mL

## 2014-03-04 LAB — CBC WITH DIFFERENTIAL/PLATELET
Basophil #: 0 10*3/uL (ref 0.0–0.1)
Basophil %: 0.4 %
Eosinophil #: 0 10*3/uL (ref 0.0–0.7)
Eosinophil %: 0.3 %
HCT: 37.5 % (ref 35.0–47.0)
HGB: 12.3 g/dL (ref 12.0–16.0)
Lymphocyte #: 1.1 10*3/uL (ref 1.0–3.6)
Lymphocyte %: 14.1 %
MCH: 31 pg (ref 26.0–34.0)
MCHC: 32.8 g/dL (ref 32.0–36.0)
MCV: 95 fL (ref 80–100)
Monocyte #: 0.5 x10 3/mm (ref 0.2–0.9)
Monocyte %: 6.9 %
Neutrophil #: 6.1 10*3/uL (ref 1.4–6.5)
Neutrophil %: 78.3 %
Platelet: 174 10*3/uL (ref 150–440)
RBC: 3.97 10*6/uL (ref 3.80–5.20)
RDW: 13.9 % (ref 11.5–14.5)
WBC: 7.8 10*3/uL (ref 3.6–11.0)

## 2014-03-07 LAB — URINALYSIS, COMPLETE
Bacteria: NONE SEEN
Bilirubin,UR: NEGATIVE
Blood: NEGATIVE
Glucose,UR: NEGATIVE mg/dL (ref 0–75)
Ketone: NEGATIVE
Leukocyte Esterase: NEGATIVE
Nitrite: NEGATIVE
Ph: 6 (ref 4.5–8.0)
Protein: NEGATIVE
RBC,UR: <1 /HPF (ref 0–5)
Specific Gravity: 1.009 (ref 1.003–1.030)
Squamous Epithelial: 1
WBC UR: 1 /HPF (ref 0–5)

## 2014-03-08 ENCOUNTER — Telehealth: Payer: Self-pay | Admitting: Internal Medicine

## 2014-03-08 LAB — CBC WITH DIFFERENTIAL/PLATELET
Basophil #: 0 10*3/uL (ref 0.0–0.1)
Basophil %: 0.5 %
Eosinophil #: 0.1 10*3/uL (ref 0.0–0.7)
Eosinophil %: 2 %
HCT: 38.1 % (ref 35.0–47.0)
HGB: 12.5 g/dL (ref 12.0–16.0)
Lymphocyte #: 1.1 10*3/uL (ref 1.0–3.6)
Lymphocyte %: 15.2 %
MCH: 30.9 pg (ref 26.0–34.0)
MCHC: 32.9 g/dL (ref 32.0–36.0)
MCV: 94 fL (ref 80–100)
Monocyte #: 0.5 x10 3/mm (ref 0.2–0.9)
Monocyte %: 7.1 %
Neutrophil #: 5.4 10*3/uL (ref 1.4–6.5)
Neutrophil %: 75.2 %
Platelet: 274 10*3/uL (ref 150–440)
RBC: 4.06 10*6/uL (ref 3.80–5.20)
RDW: 14.2 % (ref 11.5–14.5)
WBC: 7.2 10*3/uL (ref 3.6–11.0)

## 2014-03-08 LAB — CULTURE, BLOOD (SINGLE)

## 2014-03-08 NOTE — Telephone Encounter (Signed)
error 

## 2014-03-09 LAB — CREATININE, SERUM
Creatinine: 1.21 mg/dL (ref 0.60–1.30)
EGFR (African American): >60
EGFR (Non-African Amer.): 50 — ABNORMAL LOW

## 2014-03-10 ENCOUNTER — Telehealth: Payer: Self-pay | Admitting: *Deleted

## 2014-03-10 MED ORDER — HYDROCOD POLST-CHLORPHEN POLST 10-8 MG/5ML PO LQCR: 5.0000 mL | Freq: Every evening | ORAL | Status: DC | PRN

## 2014-03-10 MED ORDER — BENZONATATE 200 MG PO CAPS: 200.0000 mg | ORAL_CAPSULE | Freq: Three times a day (TID) | ORAL | Status: DC | PRN

## 2014-03-10 NOTE — Telephone Encounter (Signed)
Pt is requesting both Tussionex and Tessalon Perles as the Tussionex works best at night.  States she was getting both while in the hospital.  Please advise

## 2014-03-10 NOTE — Telephone Encounter (Signed)
tussionex printed,  Tessalon sent,

## 2014-03-10 NOTE — Telephone Encounter (Signed)
Pt called states she was d/c'd from [facility] today for pneumonia, she was admitted on 1.8.16.  Pt was d/c'd with Cefuroxime Axetil 500 mg.  Pt is requesting a cough medication.  Please advise

## 2014-03-10 NOTE — Telephone Encounter (Signed)
Hard script given to patient.

## 2014-03-10 NOTE — Telephone Encounter (Signed)
Please tell her to take a probiotic ( Align, Floraque or [employer]) while you are on the antibiotic to prevent a serious antibiotic associated diarrhea  Called clostirudium dificile colitis  If she wants tussionex , she'll have to come by the office to pick up the rx,  If not I can call in Tessalon perles or cheratussin (has codeine in it)

## 2014-03-23 ENCOUNTER — Telehealth: Payer: Self-pay | Admitting: Internal Medicine

## 2014-03-23 NOTE — Telephone Encounter (Signed)
Pt notified and verbalized understanding.

## 2014-03-23 NOTE — Telephone Encounter (Signed)
Please give Suzanne Spears 4:30 on Tuesday Feb 2 30 minutes

## 2014-03-23 NOTE — Telephone Encounter (Signed)
Pt states she has followed up with Dr. Raul Del. Pneumonia symptoms have resolved, but she has not been sleeping well since she was in the hospital, has only been taking melatonin for sleep. States doesn't know exactly what is keeping her up, not coughing. States she is sore from when she had been coughing and makes it harder to fall asleep. Also wants to discuss her "overall medical health" with Dr. Derrel Nip, was due for defibrillator placement, but then got sick and has not had that done yet. No 30 minute appts currently in the next month.

## 2014-03-23 NOTE — Telephone Encounter (Signed)
Pt scheduled 03/28/14 @ 4:30 per Dr. Derrel Nip

## 2014-03-23 NOTE — Telephone Encounter (Signed)
Pt called to schedule HFU (dx: pneumonia). Pt was discharged two weeks ago. Please advise where to add to schedule.msn

## 2014-03-23 NOTE — Telephone Encounter (Signed)
Please call patient and find out if she is feeling better. From the pneumonia standpoint.  Since it has been two weeks there is no urgency unless she is not feeling better She needs a 30 minute slot either way

## 2014-03-23 NOTE — Telephone Encounter (Signed)
Please add to schedule  

## 2014-03-28 ENCOUNTER — Ambulatory Visit (INDEPENDENT_AMBULATORY_CARE_PROVIDER_SITE_OTHER): Payer: BLUE CROSS/BLUE SHIELD | Admitting: Internal Medicine

## 2014-03-28 ENCOUNTER — Encounter: Payer: Self-pay | Admitting: Internal Medicine

## 2014-03-28 VITALS — BP 120/78 | HR 84 | Temp 97.9°F | Resp 14 | Ht 66.0 in | Wt 157.2 lb

## 2014-03-28 DIAGNOSIS — G4700 Insomnia, unspecified: Secondary | ICD-10-CM

## 2014-03-28 DIAGNOSIS — T801XXA Vascular complications following infusion, transfusion and therapeutic injection, initial encounter: Secondary | ICD-10-CM

## 2014-03-28 DIAGNOSIS — I255 Ischemic cardiomyopathy: Secondary | ICD-10-CM

## 2014-03-28 DIAGNOSIS — I429 Cardiomyopathy, unspecified: Secondary | ICD-10-CM

## 2014-03-28 DIAGNOSIS — J4541 Moderate persistent asthma with (acute) exacerbation: Secondary | ICD-10-CM

## 2014-03-28 DIAGNOSIS — J189 Pneumonia, unspecified organism: Secondary | ICD-10-CM

## 2014-03-28 DIAGNOSIS — F32A Depression, unspecified: Secondary | ICD-10-CM

## 2014-03-28 DIAGNOSIS — I420 Dilated cardiomyopathy: Secondary | ICD-10-CM

## 2014-03-28 DIAGNOSIS — I809 Phlebitis and thrombophlebitis of unspecified site: Secondary | ICD-10-CM

## 2014-03-28 DIAGNOSIS — F329 Major depressive disorder, single episode, unspecified: Secondary | ICD-10-CM

## 2014-03-28 MED ORDER — TRAZODONE HCL 50 MG PO TABS: 25.0000 mg | ORAL_TABLET | Freq: Every evening | ORAL | Status: DC | PRN

## 2014-03-28 MED ORDER — BENZONATATE 100 MG PO CAPS: 100.0000 mg | ORAL_CAPSULE | Freq: Two times a day (BID) | ORAL | Status: DC | PRN

## 2014-03-28 MED ORDER — DICLOFENAC SODIUM 3 % TD GEL: TRANSDERMAL | Status: DC
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
## 2014-03-28 NOTE — Progress Notes (Signed)
Pre-visit discussion using our clinic review tool. No additional management support is needed unless otherwise documented below in the visit note.  

## 2014-03-29 ENCOUNTER — Telehealth: Payer: Self-pay | Admitting: Internal Medicine

## 2014-03-29 ENCOUNTER — Other Ambulatory Visit: Payer: Self-pay | Admitting: *Deleted

## 2014-03-29 ENCOUNTER — Encounter: Payer: Self-pay | Admitting: Internal Medicine

## 2014-03-29 DIAGNOSIS — G4700 Insomnia, unspecified: Secondary | ICD-10-CM | POA: Insufficient documentation

## 2014-03-29 DIAGNOSIS — J189 Pneumonia, unspecified organism: Secondary | ICD-10-CM | POA: Insufficient documentation

## 2014-03-29 DIAGNOSIS — F418 Other specified anxiety disorders: Secondary | ICD-10-CM | POA: Insufficient documentation

## 2014-03-29 DIAGNOSIS — T801XXA Vascular complications following infusion, transfusion and therapeutic injection, initial encounter: Secondary | ICD-10-CM | POA: Insufficient documentation

## 2014-03-29 DIAGNOSIS — I809 Phlebitis and thrombophlebitis of unspecified site: Secondary | ICD-10-CM | POA: Insufficient documentation

## 2014-03-29 MED ORDER — DICLOFENAC SODIUM 3 % TD GEL: TRANSDERMAL | Status: DC

## 2014-03-29 MED ORDER — TRAZODONE HCL 50 MG PO TABS: 25.0000 mg | ORAL_TABLET | Freq: Every evening | ORAL | Status: DC | PRN

## 2014-03-29 MED ORDER — HYDROCOD POLST-CHLORPHEN POLST 10-8 MG/5ML PO LQCR: 10.0000 mL | Freq: Every evening | ORAL | Status: DC | PRN

## 2014-03-29 MED ORDER — BENZONATATE 100 MG PO CAPS: 100.0000 mg | ORAL_CAPSULE | Freq: Two times a day (BID) | ORAL | Status: DC | PRN

## 2014-03-29 NOTE — Telephone Encounter (Signed)
Have sent other scripts to [facility] as requested, Please advise as to tussionex refill.

## 2014-03-29 NOTE — Assessment & Plan Note (Signed)
Trial of trazodone 25 mg at bedtime.

## 2014-03-29 NOTE — Telephone Encounter (Signed)
She will have to pick it up . But yes i will refill

## 2014-03-29 NOTE — Telephone Encounter (Signed)
Pt called requesting another Rx for Tussionex.

## 2014-03-29 NOTE — Assessment & Plan Note (Signed)
hospitlazed  at [facility] from Jan 1 to Jan 8.  Returned to week this week,  Still coughing,  Still hoarse and fatigued. Complicated by asthma

## 2014-03-29 NOTE — Assessment & Plan Note (Addendum)
Continue tussionex and tessalon for control of cough.

## 2014-03-29 NOTE — Assessment & Plan Note (Signed)
There was no evidence of neuropathy by EMG/nerve conduction studies done May 4 by Dr. Brigitte Pulse.  cymbalta was prescribed at prior visit.

## 2014-03-29 NOTE — Assessment & Plan Note (Signed)
Spent considerable time discussing her general medical conditio,  Her feelings of futility and of becoming victimized by her illnesses.  Encouraged her to consider psychotherapy to help her cope with her limitations without being limited by them.

## 2014-03-29 NOTE — Progress Notes (Unsigned)
Patient called office and ask for scripts to be sent to [facility] [place] scripts sent electronically.

## 2014-03-29 NOTE — Assessment & Plan Note (Signed)
He has persistent pain and thickening of he vein on the dorsum of her left hand, prior IV site.  Ultrasound ordered.

## 2014-03-29 NOTE — Assessment & Plan Note (Signed)
Ef 30%, no ischemia, mild to moderate mitral regurgitation by recent ECHO/stress test  [facility] 2015) .  She has been advised to have a defibrillator placement , and would like a second opinion on now any leads would benefit her. Referral to [facility] for second opinion offered.

## 2014-03-29 NOTE — Telephone Encounter (Signed)
Please send all rx that were order yesterday to [facility] in [email or web address]

## 2014-03-31 ENCOUNTER — Other Ambulatory Visit: Payer: Self-pay | Admitting: *Deleted

## 2014-03-31 ENCOUNTER — Ambulatory Visit: Payer: Self-pay | Admitting: Internal Medicine

## 2014-03-31 NOTE — Progress Notes (Signed)
error 

## 2014-04-02 ENCOUNTER — Telehealth: Payer: Self-pay | Admitting: Internal Medicine

## 2014-04-02 NOTE — Telephone Encounter (Signed)
FMLA form complletel.  In red folder charge is #50

## 2014-04-03 ENCOUNTER — Telehealth: Payer: Self-pay | Admitting: Internal Medicine

## 2014-04-03 NOTE — Telephone Encounter (Signed)
Her ultrasound did show a small thrombus in the vein on her hand, but the treatmewnt is the same as what we discussed:  Warm compressess, alternated with cold,  Whichever feels better.

## 2014-04-03 NOTE — Telephone Encounter (Signed)
Form placed up front for pickup. 1 copy sent to billing. 1 copy to scan

## 2014-04-03 NOTE — Telephone Encounter (Signed)
Left message for pt to return my call and sent mychart message. 

## 2014-04-05 ENCOUNTER — Ambulatory Visit: Payer: Self-pay | Admitting: Internal Medicine

## 2014-04-05 ENCOUNTER — Telehealth: Payer: Self-pay | Admitting: Internal Medicine

## 2014-04-05 ENCOUNTER — Encounter: Payer: Self-pay | Admitting: *Deleted

## 2014-04-05 DIAGNOSIS — M7989 Other specified soft tissue disorders: Secondary | ICD-10-CM

## 2014-04-05 MED ORDER — HYDROCODONE-ACETAMINOPHEN 5-325 MG PO TABS
1.0000 | ORAL_TABLET | Freq: Four times a day (QID) | ORAL | Status: DC | PRN
Start: 2014-04-05 — End: 2014-10-10

## 2014-04-05 MED ORDER — DICLOFENAC SODIUM 1 % TD GEL: 2.0000 g | Freq: Four times a day (QID) | TRANSDERMAL | Status: DC

## 2014-04-05 NOTE — Telephone Encounter (Signed)
She has phlebitis of the basilic vein,  Which does not need to be treated with blood thinners.  Elevating the arm,  Using warm and cool compresses,  And nonsteroidal anti inflammatory on the area  Will help .  I have prescribed diclofenac gel to use 4 times daily on the arm.  If it is too expensive,  She can use ibuprfoem 800 mg three times dail y AND the vicodin for pain

## 2014-04-05 NOTE — Telephone Encounter (Addendum)
Parker from [facility]  Reports there is not DVT however there are clots in the left arm basilic vein.  That's in the forearm leading towards the shoulder.  At the shoulder there are no clots seen.  The wrist area is unchanged from 2.5.16.  Pt is waiting at [facility] for disposition. Parker's return call number is [phone].

## 2014-04-05 NOTE — Telephone Encounter (Signed)
FYI

## 2014-04-05 NOTE — Telephone Encounter (Signed)
Patient has been scheduled at [facility]  2.10.16 @ 1:15. The patient is aware.

## 2014-04-05 NOTE — Telephone Encounter (Signed)
Patient notified and voiced understanding, patient picked up hard script, and signed for at front desk.

## 2014-04-05 NOTE — Telephone Encounter (Signed)
Patient called,  Had not been seen yet by Urgent Care Dr Derrel Nip setting up an urgent ultrasound of left arm to rule out propogating clot. Suzanne Spears working on scheduling

## 2014-04-05 NOTE — Telephone Encounter (Signed)
Patient Name: Suzanne Spears  DOB: [birth year]    Initial Comment caller states she has a blood clot in her wrist and has pain in elbow and its moving up her arm    Nurse Assessment  Nurse: Mallie Mussel, RN, Alveta Heimlich Date/Time Eilene Ghazi Time): 04/05/2014 11:37:46 AM  Confirm and document reason for call. If symptomatic, describe symptoms. ---Caller states that she was seen Friday and found a blood clot in her left wrist. She was released from the hospital on 15th of January. Her arm has been hurting since then. It is hurting worse now. The arm is swelling as well since then also. The pain has become worse since she went back to work. She rates her pain as 8 on 0-10 scale. The pain is now moving up her arm instead of down her arm. She has not taken anything for the pain. She denies chest pain and difficulty breathing.  Has the patient traveled out of the country within the last 30 days? ---No  Does the patient require triage? ---Yes  Related visit to physician within the last 2 weeks? ---Yes  Does the PT have any chronic conditions? (i.e. diabetes, asthma, etc.) ---Yes  List chronic conditions. ---Asthma, CHF  Did the patient indicate they were pregnant? ---No     Guidelines    Guideline Title Affirmed Question Affirmed Notes  Arm Pain [1] Red area or streak AND [2] large (> 2 in. or 5 cm)    Final Disposition User   See Physician within 4 Hours (or PCP triage) Mallie Mussel, RN, [facility] only wanted to see Dr. Derrel Nip if possible. No appointments were available for her. I did check other providers in the office and they were all booked up also. I suggested we look at [facility], but the caller declined, she does not want to go there. She will go to a walk-in clinic instead.

## 2014-04-06 ENCOUNTER — Telehealth: Payer: Self-pay

## 2014-04-06 NOTE — Telephone Encounter (Signed)
Pa for voltaren gel started on cover my meds. Awaiting a response at this time.

## 2014-04-12 DIAGNOSIS — Z7689 Persons encountering health services in other specified circumstances: Secondary | ICD-10-CM

## 2014-04-14 ENCOUNTER — Encounter: Payer: Self-pay | Admitting: Internal Medicine

## 2014-04-19 ENCOUNTER — Other Ambulatory Visit: Payer: Self-pay | Admitting: Internal Medicine

## 2014-04-19 MED ORDER — NORETHIN ACE-ETH ESTRAD-FE 1-20 MG-MCG PO TABS: 1.0000 | ORAL_TABLET | Freq: Every day | ORAL | Status: DC

## 2014-04-19 NOTE — Telephone Encounter (Signed)
[facility]

## 2014-04-20 ENCOUNTER — Encounter: Payer: Self-pay | Admitting: Internal Medicine

## 2014-04-24 ENCOUNTER — Telehealth: Payer: Self-pay | Admitting: Internal Medicine

## 2014-04-24 NOTE — Telephone Encounter (Signed)
Please add patient to schedule at 11.30 04/26/14 acute for swelling and place on abdomen

## 2014-04-24 NOTE — Telephone Encounter (Signed)
Patient called concerned about a white circle on her abdomen, Patient stated just a white outline of a circle. Patient also stated she is having waves of nausea and swelling more, Patient also still concerned about the original nodule still on wrist. Tried to give patient an appointment with NP refused, patient staed she cannot come in tomorrow but could on Wednesday but no appointments available.

## 2014-04-24 NOTE — Telephone Encounter (Signed)
I can see her at 11:30,  1:00 or 4:30 on Wednesday

## 2014-04-26 ENCOUNTER — Encounter: Payer: Self-pay | Admitting: Internal Medicine

## 2014-04-26 ENCOUNTER — Ambulatory Visit (INDEPENDENT_AMBULATORY_CARE_PROVIDER_SITE_OTHER): Payer: BLUE CROSS/BLUE SHIELD | Admitting: Internal Medicine

## 2014-04-26 VITALS — BP 118/78 | HR 94 | Temp 98.0°F | Resp 16 | Ht 65.5 in | Wt 160.0 lb

## 2014-04-26 DIAGNOSIS — I429 Cardiomyopathy, unspecified: Secondary | ICD-10-CM

## 2014-04-26 DIAGNOSIS — R0781 Pleurodynia: Secondary | ICD-10-CM

## 2014-04-26 DIAGNOSIS — L819 Disorder of pigmentation, unspecified: Secondary | ICD-10-CM

## 2014-04-26 DIAGNOSIS — I420 Dilated cardiomyopathy: Secondary | ICD-10-CM

## 2014-04-26 DIAGNOSIS — R609 Edema, unspecified: Secondary | ICD-10-CM

## 2014-04-26 DIAGNOSIS — R5382 Chronic fatigue, unspecified: Secondary | ICD-10-CM

## 2014-04-26 DIAGNOSIS — T801XXA Vascular complications following infusion, transfusion and therapeutic injection, initial encounter: Secondary | ICD-10-CM

## 2014-04-26 DIAGNOSIS — I809 Phlebitis and thrombophlebitis of unspecified site: Secondary | ICD-10-CM

## 2014-04-26 NOTE — Assessment & Plan Note (Addendum)
With clots noted in basilic vein  ,  No anticoagulation indications. Symptoms in upper arm have resolved, but she has persistent pain at the wrist and a palpable nodule.  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  Referral to [facility] for evaluation.
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
## 2014-04-27 ENCOUNTER — Encounter: Payer: Self-pay | Admitting: *Deleted

## 2014-04-27 ENCOUNTER — Other Ambulatory Visit: Payer: Self-pay

## 2014-04-27 ENCOUNTER — Telehealth: Payer: Self-pay | Admitting: Internal Medicine

## 2014-04-27 ENCOUNTER — Encounter: Payer: Self-pay | Admitting: Internal Medicine

## 2014-04-27 ENCOUNTER — Ambulatory Visit (INDEPENDENT_AMBULATORY_CARE_PROVIDER_SITE_OTHER): Payer: BLUE CROSS/BLUE SHIELD | Admitting: Internal Medicine

## 2014-04-27 VITALS — BP 114/80 | HR 88 | Ht 65.5 in | Wt 159.8 lb

## 2014-04-27 DIAGNOSIS — I5022 Chronic systolic (congestive) heart failure: Secondary | ICD-10-CM | POA: Diagnosis not present

## 2014-04-27 DIAGNOSIS — R42 Dizziness and giddiness: Secondary | ICD-10-CM

## 2014-04-27 DIAGNOSIS — I429 Cardiomyopathy, unspecified: Secondary | ICD-10-CM

## 2014-04-27 DIAGNOSIS — I447 Left bundle-branch block, unspecified: Secondary | ICD-10-CM

## 2014-04-27 DIAGNOSIS — I428 Other cardiomyopathies: Secondary | ICD-10-CM

## 2014-04-27 NOTE — Assessment & Plan Note (Addendum)
The patient has had chronic class 3 symptoms and a QRS duration of 125 ms. She has been unable to tolerate beta blocker nor ARB's/ACE inhibitors. I have discussed the treatment options with the patient and her husband. Her EF is 25% by repeated echo's for over a year. The risks/benefits/goals/expectations of ICD implant have been discussed in detail. She has a class 2B indication for a BiV ICD. Because her QRS duration is minimally increased at 130 ms by my measure, the expected initial benefit of BiV ICD in terms of re-synchronization will be limited. However, the natural history of her cardiomyopathy is that she is almost certain to develop additional dysynchrony as time goes by. Will plan to proceed with BiV ICD using adaptive pacing which will prevent RV pacing. The risks/benefits/goals/expectations of the procedure have been discussed with the patient and she wishes to proceed.

## 2014-04-27 NOTE — Assessment & Plan Note (Signed)
She does have some symptoms consistent with orthostasis. Will follow.

## 2014-04-27 NOTE — Patient Instructions (Addendum)
Your physician has recommended that you have a defibrillator inserted. An implantable cardioverter defibrillator (ICD) is a small device that is placed in your chest or, in rare cases, your abdomen. This device uses electrical pulses or shocks to help control life-threatening, irregular heartbeats that could lead the heart to suddenly stop beating (sudden cardiac arrest). Leads are attached to the ICD that goes into your heart. This is done in the hospital and usually requires an overnight stay. Please see the instruction sheet given to you today for more information.  See instruction sheet for procedure  Your physician recommends that you schedule a follow-up appointment in: 7-10 days from 4/21 in device clinic for wound check

## 2014-04-27 NOTE — Telephone Encounter (Signed)
Called and left message for patient that the date and time she is requesting is already taken and to call me back on Mon to resch of needed

## 2014-04-27 NOTE — Telephone Encounter (Signed)
New Message       Pt calling stating she is calling to let Claiborne Billings know about scheduling something for 06/08/14 at 9:30. Please call back and advise.
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
## 2014-04-29 ENCOUNTER — Encounter: Payer: Self-pay | Admitting: Internal Medicine

## 2014-04-29 DIAGNOSIS — R0781 Pleurodynia: Secondary | ICD-10-CM | POA: Insufficient documentation

## 2014-04-29 DIAGNOSIS — L819 Disorder of pigmentation, unspecified: Secondary | ICD-10-CM | POA: Insufficient documentation

## 2014-04-29 NOTE — Assessment & Plan Note (Signed)
She has persistent pain on the side where her pneumonia  Was and has had rib  Films to rule out fracture.

## 2014-04-29 NOTE — Assessment & Plan Note (Signed)
She has persistent fatigue, and  is anticipating a second opinion on her device tomorrow.

## 2014-04-29 NOTE — Assessment & Plan Note (Signed)
Possibly a fungal lesion,  But since it is improving ,  No treatment at this point unless it fails to resolve or develops pruritus

## 2014-04-29 NOTE — Assessment & Plan Note (Signed)
Secondary to cardiomyopathy with systolic dysfunction.  She has leg pai with miild fluid retention today.  Resume spironolactone

## 2014-05-01 NOTE — Telephone Encounter (Signed)
Changed her lab appointment to 4/11

## 2014-05-01 NOTE — Telephone Encounter (Signed)
F/u    Pt returning your call from last week

## 2014-05-03 ENCOUNTER — Other Ambulatory Visit: Payer: Self-pay | Admitting: Internal Medicine

## 2014-05-03 ENCOUNTER — Encounter: Payer: Self-pay | Admitting: Internal Medicine

## 2014-05-03 DIAGNOSIS — T801XXS Vascular complications following infusion, transfusion and therapeutic injection, sequela: Secondary | ICD-10-CM

## 2014-05-03 DIAGNOSIS — I809 Phlebitis and thrombophlebitis of unspecified site: Secondary | ICD-10-CM

## 2014-05-15 ENCOUNTER — Ambulatory Visit: Payer: Self-pay | Admitting: Vascular Surgery

## 2014-05-17 ENCOUNTER — Ambulatory Visit: Payer: Self-pay | Admitting: Vascular Surgery

## 2014-06-05 ENCOUNTER — Other Ambulatory Visit (INDEPENDENT_AMBULATORY_CARE_PROVIDER_SITE_OTHER): Payer: BLUE CROSS/BLUE SHIELD | Admitting: *Deleted

## 2014-06-05 DIAGNOSIS — I428 Other cardiomyopathies: Secondary | ICD-10-CM

## 2014-06-05 DIAGNOSIS — I429 Cardiomyopathy, unspecified: Secondary | ICD-10-CM | POA: Diagnosis not present

## 2014-06-05 LAB — CBC WITH DIFFERENTIAL/PLATELET
Basophils Absolute: 0 10*3/uL (ref 0.0–0.1)
Basophils Relative: 0.4 % (ref 0.0–3.0)
Eosinophils Absolute: 0.1 10*3/uL (ref 0.0–0.7)
Eosinophils Relative: 1.4 % (ref 0.0–5.0)
HCT: 39 % (ref 36.0–46.0)
Hemoglobin: 13.2 g/dL (ref 12.0–15.0)
Lymphocytes Relative: 29.2 % (ref 12.0–46.0)
Lymphs Abs: 1.7 10*3/uL (ref 0.7–4.0)
MCHC: 33.9 g/dL (ref 30.0–36.0)
MCV: 90.2 fl (ref 78.0–100.0)
Monocytes Absolute: 0.3 10*3/uL (ref 0.1–1.0)
Monocytes Relative: 5.8 % (ref 3.0–12.0)
Neutro Abs: 3.7 10*3/uL (ref 1.4–7.7)
Neutrophils Relative %: 63.2 % (ref 43.0–77.0)
Platelets: 252 10*3/uL (ref 150.0–400.0)
RBC: 4.32 Mil/uL (ref 3.87–5.11)
RDW: 14.2 % (ref 11.5–15.5)
WBC: 5.8 10*3/uL (ref 4.0–10.5)

## 2014-06-05 LAB — BASIC METABOLIC PANEL
BUN: 16 mg/dL (ref 6–23)
CO2: 24 mEq/L (ref 19–32)
Calcium: 9.6 mg/dL (ref 8.4–10.5)
Chloride: 109 mEq/L (ref 96–112)
Creatinine, Ser: 1.12 mg/dL (ref 0.40–1.20)
GFR: 54.08 mL/min — ABNORMAL LOW (ref >60.00)
Glucose, Bld: 88 mg/dL (ref 70–99)
Potassium: 4.2 mEq/L (ref 3.5–5.1)
Sodium: 139 mEq/L (ref 135–145)

## 2014-06-08 ENCOUNTER — Other Ambulatory Visit: Payer: BLUE CROSS/BLUE SHIELD

## 2014-06-12 ENCOUNTER — Emergency Department
Admit: 2014-06-12 | Disposition: A | Discharge status: Home / Self Care | Payer: Self-pay | Admitting: Emergency Medicine

## 2014-06-12 LAB — BASIC METABOLIC PANEL
Anion Gap: 4 — ABNORMAL LOW (ref 7–16)
BUN: 15 mg/dL
Calcium, Total: 8.9 mg/dL
Chloride: 113 mmol/L — ABNORMAL HIGH
Co2: 23 mmol/L
Creatinine: 1.18 mg/dL — ABNORMAL HIGH
EGFR (African American): >60
EGFR (Non-African Amer.): 53 — ABNORMAL LOW
Glucose: 93 mg/dL
Potassium: 4 mmol/L
Sodium: 140 mmol/L

## 2014-06-12 LAB — CBC
HCT: 39.7 % (ref 35.0–47.0)
HGB: 13.4 g/dL (ref 12.0–16.0)
MCH: 31.5 pg (ref 26.0–34.0)
MCHC: 33.7 g/dL (ref 32.0–36.0)
MCV: 93 fL (ref 80–100)
Platelet: 230 10*3/uL (ref 150–440)
RBC: 4.25 10*6/uL (ref 3.80–5.20)
RDW: 13.7 % (ref 11.5–14.5)
WBC: 5.4 10*3/uL (ref 3.6–11.0)

## 2014-06-12 LAB — URINALYSIS, COMPLETE
Bacteria: NONE SEEN
Bilirubin,UR: NEGATIVE
Blood: NEGATIVE
Glucose,UR: NEGATIVE mg/dL (ref 0–75)
Ketone: NEGATIVE
Leukocyte Esterase: NEGATIVE
Nitrite: NEGATIVE
Ph: 7 (ref 4.5–8.0)
Protein: NEGATIVE
RBC,UR: NONE SEEN /HPF (ref 0–5)
Specific Gravity: 1.004 (ref 1.003–1.030)

## 2014-06-12 LAB — TROPONIN I: Troponin-I: 0.03 ng/mL

## 2014-06-12 LAB — PRO B NATRIURETIC PEPTIDE: B-Type Natriuretic Peptide: 156 pg/mL — ABNORMAL HIGH

## 2014-06-14 DIAGNOSIS — Z79899 Other long term (current) drug therapy: Secondary | ICD-10-CM | POA: Diagnosis not present

## 2014-06-14 DIAGNOSIS — I2510 Atherosclerotic heart disease of native coronary artery without angina pectoris: Secondary | ICD-10-CM | POA: Diagnosis not present

## 2014-06-14 DIAGNOSIS — J45909 Unspecified asthma, uncomplicated: Secondary | ICD-10-CM | POA: Diagnosis not present

## 2014-06-14 DIAGNOSIS — I341 Nonrheumatic mitral (valve) prolapse: Secondary | ICD-10-CM | POA: Diagnosis not present

## 2014-06-14 DIAGNOSIS — I340 Nonrheumatic mitral (valve) insufficiency: Secondary | ICD-10-CM | POA: Diagnosis not present

## 2014-06-14 DIAGNOSIS — K219 Gastro-esophageal reflux disease without esophagitis: Secondary | ICD-10-CM | POA: Diagnosis not present

## 2014-06-14 DIAGNOSIS — I420 Dilated cardiomyopathy: Secondary | ICD-10-CM | POA: Diagnosis not present

## 2014-06-14 DIAGNOSIS — I5022 Chronic systolic (congestive) heart failure: Secondary | ICD-10-CM | POA: Diagnosis not present

## 2014-06-14 DIAGNOSIS — Z9049 Acquired absence of other specified parts of digestive tract: Secondary | ICD-10-CM | POA: Diagnosis not present

## 2014-06-14 DIAGNOSIS — I447 Left bundle-branch block, unspecified: Secondary | ICD-10-CM | POA: Diagnosis not present

## 2014-06-14 DIAGNOSIS — I252 Old myocardial infarction: Secondary | ICD-10-CM | POA: Diagnosis not present

## 2014-06-14 MED ORDER — VANCOMYCIN HCL IN DEXTROSE 1-5 GM/200ML-% IV SOLN
1000.0000 mg | INTRAVENOUS | Status: DC
  Filled 2014-06-14: qty 200

## 2014-06-14 MED ORDER — SODIUM CHLORIDE 0.9 % IV SOLN
INTRAVENOUS | Status: DC
  Administered 2014-06-15: 06:00:00 via INTRAVENOUS

## 2014-06-14 MED ORDER — SODIUM CHLORIDE 0.9 % IJ SOLN: 3.0000 mL | Freq: Two times a day (BID) | INTRAMUSCULAR | Status: DC

## 2014-06-14 MED ORDER — SODIUM CHLORIDE 0.9 % IV SOLN
250.0000 mL | INTRAVENOUS | Status: DC
Start: 2014-06-14 — End: 2014-06-15

## 2014-06-14 MED ORDER — SODIUM CHLORIDE 0.9 % IJ SOLN: 3.0000 mL | INTRAMUSCULAR | Status: DC | PRN

## 2014-06-14 MED ORDER — SODIUM CHLORIDE 0.9 % IR SOLN
80.0000 mg | Status: DC
  Filled 2014-06-14: qty 2

## 2014-06-14 MED ORDER — CHLORHEXIDINE GLUCONATE 4 % EX LIQD
60.0000 mL | Freq: Once | CUTANEOUS | Status: DC
  Filled 2014-06-14: qty 60

## 2014-06-15 ENCOUNTER — Encounter (HOSPITAL_COMMUNITY)
Admission: RE | Disposition: A | Discharge status: Home / Self Care | Payer: Self-pay | Source: Ambulatory Visit | Attending: Internal Medicine

## 2014-06-15 ENCOUNTER — Ambulatory Visit (HOSPITAL_COMMUNITY)
Admission: RE | Admit: 2014-06-15 | Discharge: 2014-06-16 | Disposition: A | Discharge status: Home / Self Care | Payer: BLUE CROSS/BLUE SHIELD | Source: Ambulatory Visit | Attending: Internal Medicine | Admitting: Internal Medicine

## 2014-06-15 ENCOUNTER — Encounter (HOSPITAL_COMMUNITY): Payer: Self-pay | Admitting: General Practice

## 2014-06-15 DIAGNOSIS — I447 Left bundle-branch block, unspecified: Secondary | ICD-10-CM | POA: Diagnosis not present

## 2014-06-15 DIAGNOSIS — I2510 Atherosclerotic heart disease of native coronary artery without angina pectoris: Secondary | ICD-10-CM | POA: Insufficient documentation

## 2014-06-15 DIAGNOSIS — I429 Cardiomyopathy, unspecified: Secondary | ICD-10-CM | POA: Diagnosis not present

## 2014-06-15 DIAGNOSIS — I341 Nonrheumatic mitral (valve) prolapse: Secondary | ICD-10-CM | POA: Insufficient documentation

## 2014-06-15 DIAGNOSIS — K219 Gastro-esophageal reflux disease without esophagitis: Secondary | ICD-10-CM | POA: Insufficient documentation

## 2014-06-15 DIAGNOSIS — I428 Other cardiomyopathies: Secondary | ICD-10-CM

## 2014-06-15 DIAGNOSIS — I420 Dilated cardiomyopathy: Secondary | ICD-10-CM | POA: Diagnosis not present

## 2014-06-15 DIAGNOSIS — Z9049 Acquired absence of other specified parts of digestive tract: Secondary | ICD-10-CM | POA: Insufficient documentation

## 2014-06-15 DIAGNOSIS — I5022 Chronic systolic (congestive) heart failure: Secondary | ICD-10-CM

## 2014-06-15 DIAGNOSIS — Z95810 Presence of automatic (implantable) cardiac defibrillator: Secondary | ICD-10-CM | POA: Diagnosis present

## 2014-06-15 DIAGNOSIS — J45909 Unspecified asthma, uncomplicated: Secondary | ICD-10-CM | POA: Insufficient documentation

## 2014-06-15 DIAGNOSIS — Z950 Presence of cardiac pacemaker: Secondary | ICD-10-CM | POA: Insufficient documentation

## 2014-06-15 DIAGNOSIS — I252 Old myocardial infarction: Secondary | ICD-10-CM | POA: Insufficient documentation

## 2014-06-15 DIAGNOSIS — I340 Nonrheumatic mitral (valve) insufficiency: Secondary | ICD-10-CM | POA: Insufficient documentation

## 2014-06-15 DIAGNOSIS — Z79899 Other long term (current) drug therapy: Secondary | ICD-10-CM | POA: Insufficient documentation

## 2014-06-15 HISTORY — DX: Presence of automatic (implantable) cardiac defibrillator: Z95.810

## 2014-06-15 HISTORY — PX: BI-VENTRICULAR IMPLANTABLE CARDIOVERTER DEFIBRILLATOR: SHX5459

## 2014-06-15 HISTORY — DX: Gastro-esophageal reflux disease without esophagitis: K21.9

## 2014-06-15 LAB — SURGICAL PCR SCREEN
MRSA, PCR: NEGATIVE
Staphylococcus aureus: NEGATIVE

## 2014-06-15 LAB — PREGNANCY, URINE: Preg Test, Ur: NEGATIVE

## 2014-06-15 SURGERY — BI-VENTRICULAR IMPLANTABLE CARDIOVERTER DEFIBRILLATOR  (CRT-D)
Anesthesia: LOCAL

## 2014-06-15 MED ORDER — MUPIROCIN 2 % EX OINT
1.0000 "application " | TOPICAL_OINTMENT | Freq: Once | CUTANEOUS | Status: AC
  Administered 2014-06-15: 1 via TOPICAL

## 2014-06-15 MED ORDER — ACETAMINOPHEN 325 MG PO TABS: 325.0000 mg | ORAL_TABLET | ORAL | Status: DC | PRN

## 2014-06-15 MED ORDER — ALBUTEROL SULFATE (2.5 MG/3ML) 0.083% IN NEBU: 2.5000 mg | INHALATION_SOLUTION | Freq: Every day | RESPIRATORY_TRACT | Status: DC | PRN

## 2014-06-15 MED ORDER — DULOXETINE HCL 60 MG PO CPEP
60.0000 mg | ORAL_CAPSULE | Freq: Every day | ORAL | Status: DC
  Administered 2014-06-15 – 2014-06-16 (×2): 60 mg via ORAL
  Filled 2014-06-15 (×2): qty 1

## 2014-06-15 MED ORDER — MIDAZOLAM HCL 5 MG/5ML IJ SOLN
INTRAMUSCULAR | Status: AC
  Filled 2014-06-15: qty 5

## 2014-06-15 MED ORDER — TRAZODONE 25 MG HALF TABLET
25.0000 mg | ORAL_TABLET | Freq: Every evening | ORAL | Status: DC | PRN
  Filled 2014-06-15: qty 2

## 2014-06-15 MED ORDER — DIAZEPAM 5 MG PO TABS: 5.0000 mg | ORAL_TABLET | Freq: Four times a day (QID) | ORAL | Status: DC | PRN

## 2014-06-15 MED ORDER — MUPIROCIN 2 % EX OINT
TOPICAL_OINTMENT | CUTANEOUS | Status: AC
  Filled 2014-06-15: qty 22

## 2014-06-15 MED ORDER — HEPARIN (PORCINE) IN NACL 2-0.9 UNIT/ML-% IJ SOLN
INTRAMUSCULAR | Status: AC
  Filled 2014-06-15: qty 500

## 2014-06-15 MED ORDER — FENTANYL CITRATE (PF) 100 MCG/2ML IJ SOLN
INTRAMUSCULAR | Status: AC
  Filled 2014-06-15: qty 2

## 2014-06-15 MED ORDER — LIDOCAINE HCL (PF) 1 % IJ SOLN
INTRAMUSCULAR | Status: AC
  Filled 2014-06-15: qty 60

## 2014-06-15 MED ORDER — MONTELUKAST SODIUM 10 MG PO TABS
10.0000 mg | ORAL_TABLET | Freq: Every day | ORAL | Status: DC
  Administered 2014-06-15: 10 mg via ORAL
  Filled 2014-06-15 (×2): qty 1

## 2014-06-15 MED ORDER — VANCOMYCIN HCL IN DEXTROSE 1-5 GM/200ML-% IV SOLN
1000.0000 mg | Freq: Two times a day (BID) | INTRAVENOUS | Status: AC
  Administered 2014-06-16: 1000 mg via INTRAVENOUS
  Filled 2014-06-15 (×2): qty 200

## 2014-06-15 MED ORDER — CYANOCOBALAMIN 1000 MCG/ML IJ SOLN: 1000.0000 ug | INTRAMUSCULAR | Status: DC

## 2014-06-15 MED ORDER — HYDROCODONE-ACETAMINOPHEN 5-325 MG PO TABS
1.0000 | ORAL_TABLET | Freq: Four times a day (QID) | ORAL | Status: DC | PRN
  Administered 2014-06-15 – 2014-06-16 (×3): 1 via ORAL
  Filled 2014-06-15 (×3): qty 1

## 2014-06-15 MED ORDER — PROPRANOLOL HCL 20 MG PO TABS: 20.0000 mg | ORAL_TABLET | Freq: Three times a day (TID) | ORAL | Status: DC

## 2014-06-15 MED ORDER — DOCUSATE SODIUM 100 MG PO CAPS
100.0000 mg | ORAL_CAPSULE | Freq: Two times a day (BID) | ORAL | Status: DC
  Administered 2014-06-15 – 2014-06-16 (×3): 100 mg via ORAL
  Filled 2014-06-15 (×4): qty 1

## 2014-06-15 MED ORDER — ONDANSETRON HCL 4 MG/2ML IJ SOLN: 4.0000 mg | Freq: Four times a day (QID) | INTRAMUSCULAR | Status: DC | PRN

## 2014-06-15 MED ORDER — CYCLOBENZAPRINE HCL 10 MG PO TABS
10.0000 mg | ORAL_TABLET | Freq: Three times a day (TID) | ORAL | Status: DC | PRN
  Administered 2014-06-15 – 2014-06-16 (×3): 10 mg via ORAL
  Filled 2014-06-15 (×3): qty 1

## 2014-06-15 MED ORDER — SPIRONOLACTONE 25 MG PO TABS
25.0000 mg | ORAL_TABLET | Freq: Every day | ORAL | Status: DC
  Administered 2014-06-15 – 2014-06-16 (×2): 25 mg via ORAL
  Filled 2014-06-15 (×2): qty 1
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
## 2014-06-15 NOTE — Interval H&P Note (Signed)
History and Physical Interval Note: I have discussed the case with Dr. Caryl Comes and Allred and reviewed her ECG which demonstrates LBBB with a QRS duration of 130 ms when looking at all 12 leads. Will plan to proceed with BiV ICD  06/15/2014 10:15 AM  Suzanne Spears  has presented today for surgery, with the diagnosis of non ischemic cardiomyopathy  The various methods of treatment have been discussed with the patient and family. After consideration of risks, benefits and other options for treatment, the patient has consented to  Procedure(s): BI-VENTRICULAR IMPLANTABLE CARDIOVERTER DEFIBRILLATOR  (CRT-D) (N/A) as a surgical intervention .  The patient's history has been reviewed, patient examined, no change in status, stable for surgery.  I have reviewed the patient's chart and labs.  Questions were answered to the patient's satisfaction.     Mikle Bosworth.D.

## 2014-06-15 NOTE — CV Procedure (Signed)
SURGEON:  Cristopher Peru, MD      PREPROCEDURE DIAGNOSES:   1. Nonischemic cardiomyopathy.   2. New York Heart Association class III, heart failure chronically.   3. Left bundle-branch block, QRS 130 ms.      POSTPROCEDURE DIAGNOSES:   1. Nonischemic cardiomyopathy.   2. New York Heart Association class III heart failure chronically.   3. Left bundle-branch block.   4.persistent Left sided SVC     PROCEDURES:    1. Biventricular ICD implantation.  2. Venography of the coronary sinus     INTRODUCTION:  Suzanne Spears is a 54 y.o. female with a nonischemic CM (EF 25), NYHA Class III CHF, and LBBB QRS morophology. At this time, she meets MADIT II/ SCD-HeFT criteria for ICD implantation for primary prevention of sudden death.  Given LBBB, the patient may also be expected to benefit from resynchronization therapy. The patient has been treated with an optimal medical regimen but continues to have a depressed ejection fraction and NYHA Class III CHF symptoms.  she therefore  presents today for a biventricular ICD implantation. Of note the patient is allergic to all beta blockers and developed hypotension on ACE inhibitors/ARB drugs.     DESCRIPTION OF PROCEDURE:  Informed written consent was obtained and the   patient was brought to the electrophysiology lab in the fasting state. The patient was adequately sedated with intravenous Versed and Fentanyl as outlined in the nursing report.  The patient's left chest was prepped and draped in the usual sterile fashion by the EP lab staff.  The skin overlying the left deltopectoral region was infiltrated with lidocaine for local analgesia.  A 6-cm incision was made over the left deltopectoral region.  A left subcutaneous defibrillator pocket was fashioned using a combination of sharp and blunt dissection.  Electrocautery was used to assure hemostasis.   Left Upper extremity Venography:  A venogram of the left upper extremity was performed which  revealed a moderate sized left axillary vein which emptied into a moderate sized left subclavian vein which drained into a left sided SVC.  RA/RV Lead Placement: The left axillary vein was cannulated with fluoroscopic visualization.  No contrast was required for this endeavor.  Through the left axillary vein, a Medtronic 52 cm atrial lead (serial # [id number]  ) right atrial lead and a medtronic 6935M (serial number [id number]) right ventricular defibrillator lead were advanced with fluoroscopic visualization into the right atrial appendage and right ventricular apical septal positions respectively via the persistent left SVC. Moderated difficulty was overcome to obtain these lead positions.  Initial atrial lead P-waves measured 3 mV with an impedance of 552 ohms and a threshold of 1.6 volts at 0.5 milliseconds.  The right ventricular lead R-wave measured 18 mV with impedance of 613 ohms and a threshold of 0.5 volts at 0.5 milliseconds.   LV Lead Placement:  A Medtronic guide was advanced through the left axillary vein into the left sided SVC and into the CS. A 6 french hexapolar EP catheter was introduced through the Medtronic guide and used to cannulate the coronary sinus. A coronary sinus nonselective venogram was performed by hand injection of nonionic contrast. This demonstrated a anterior vein, a posterolateral vein and a middle cardiac vein.  A 0.014 angioplasty guide wire was introduced through the Medtronic Guide and advanced into the posterolateral vein. A Medtronic(serial number [id number]) lead was advanced through the posterior lateral vein. This was approximately one-thirds from the base to the apex  in a very lateral  position. In this location with bipolar configuration, the left ventricular lead R-waves measured 20 mV with impedance of 1200 ohms and a threshold of 1.75 volt at 0.5 Milliseconds with no diaphragmatic stimulation observed when pacing at 10 volts output. The Medtronic guide was  therefore removed.  All three leads were secured to the pectoralis fascia using #2 silk suture over the suture sleeves. The pocket then irrigated with copious gentamicin solution.   Device Placement: The leads were then  connected to a Medtronic (serial  Number [id number]) biventricular ICD.  The defibrillator was placed into the  pocket.  The pocket was then closed in 2 layers with 2.0 Vicryl suture  for the subcutaneous and subcuticular layers.  Steri-Strips and a  sterile dressing were then applied.       CONCLUSIONS:   1. Nonischemic cardiomyopathy with Left bundle-branch block and chronic New York Heart Association class III heart failure.   2. Successful biventricular ICD implantation via a persistent Left sided SVC.   3 No early apparent complications.   Cristopher Peru, MD  10:16 AM 06/15/2014

## 2014-06-15 NOTE — Interval H&P Note (Signed)
History and Physical Interval Note:  06/15/2014 7:10 AM  Suzanne Spears  has presented today for surgery, with the diagnosis of non ischemic cardiomyopathy  The various methods of treatment have been discussed with the patient and family. After consideration of risks, benefits and other options for treatment, the patient has consented to  Procedure(s): BI-VENTRICULAR IMPLANTABLE CARDIOVERTER DEFIBRILLATOR  (CRT-D) (N/A) as a surgical intervention .  The patient's history has been reviewed, patient examined, no change in status, stable for surgery.  I have reviewed the patient's chart and labs.  Questions were answered to the patient's satisfaction.     Mikle Bosworth.D.

## 2014-06-15 NOTE — H&P (Signed)
  ICD Criteria  Current LVEF:25% ;Obtained > 3 months ago and < or = 6 months ago.   NYHA Functional Classification: Class III  Heart Failure History:  Yes, Duration of heart failure since onset is > 9 months  Non-Ischemic Dilated Cardiomyopathy History:  Yes, timeframe is > 9 months  Atrial Fibrillation/Atrial Flutter:  No.  Ventricular Tachycardia History:  No.  Cardiac Arrest History:  No  History of Syndromes with Risk of Sudden Death:  No.  Previous ICD:  No.  Electrophysiology Study: No.  Prior MI: No.  PPM: No.  OSA:  No  Patient Life Expectancy of >=1 year: Yes.  Anticoagulation Therapy:  Patient is NOT on anticoagulation therapy.   Beta Blocker Therapy:  Yes.   Ace Inhibitor/ARB Therapy:  No, Reason not on Ace Inhibitor/ARB therapy:  hypotension

## 2014-06-15 NOTE — Assessment & Plan Note (Signed)
QRS duration is 130 ms by my measure.

## 2014-06-15 NOTE — Progress Notes (Signed)
Patient arrived to the unit from PACU. Patient A/O, VSS, paced on the monitor, pain 6/10. Operative site C/D/I, no s/s of swelling of edema. POC discussed with the patient. Patient currently resting comfortably in bed.

## 2014-06-16 ENCOUNTER — Ambulatory Visit (HOSPITAL_COMMUNITY): Payer: BLUE CROSS/BLUE SHIELD

## 2014-06-16 ENCOUNTER — Encounter (HOSPITAL_COMMUNITY): Payer: Self-pay | Admitting: Nurse Practitioner

## 2014-06-16 DIAGNOSIS — Z95810 Presence of automatic (implantable) cardiac defibrillator: Secondary | ICD-10-CM | POA: Diagnosis not present

## 2014-06-16 DIAGNOSIS — I420 Dilated cardiomyopathy: Secondary | ICD-10-CM | POA: Diagnosis not present

## 2014-06-16 NOTE — Discharge Instructions (Signed)
° ° °  Supplemental Discharge Instructions for  Pacemaker/Defibrillator Patients  Activity No heavy lifting or vigorous activity with your left/right arm for 6 to 8 weeks.  Do not raise your left/right arm above your head for one week.  Gradually raise your affected arm as drawn below.           __       06/20/14                  06/21/14                   06/22/14                     06/23/14  NO DRIVING for   1 week  ; you may begin driving on   0/92/33  .  WOUND CARE - Keep the wound area clean and dry.  Do not get this area wet for one week. No showers for one week; you may shower on 06/23/14    . - The tape/steri-strips on your wound will fall off; do not pull them off.  No bandage is needed on the site.  DO  NOT apply any creams, oils, or ointments to the wound area. - If you notice any drainage or discharge from the wound, any swelling or bruising at the site, or you develop a fever > 101? F after you are discharged home, call the office at once.  Special Instructions - You are still able to use cellular telephones; use the ear opposite the side where you have your pacemaker/defibrillator.  Avoid carrying your cellular phone near your device. - When traveling through airports, show security personnel your identification card to avoid being screened in the metal detectors.  Ask the security personnel to use the hand wand. - Avoid arc welding equipment, MRI testing (magnetic resonance imaging), TENS units (transcutaneous nerve stimulators).  Call the office for questions about other devices. - Avoid electrical appliances that are in poor condition or are not properly grounded. - Microwave ovens are safe to be near or to operate.  Additional information for defibrillator patients should your device go off: - If your device goes off ONCE and you feel fine afterward, notify the device clinic nurses. - If your device goes off ONCE and you do not feel well afterward, call 911. - If your device  goes off TWICE, call 911. - If your device goes off THREE times in one day, call 911.  DO NOT DRIVE YOURSELF OR A FAMILY MEMBER WITH A DEFIBRILLATOR TO THE HOSPITAL--CALL 911.

## 2014-06-16 NOTE — Progress Notes (Signed)
Patient discharged to home. Discharge instructions given, patient verbalized understanding of instructions given. Patient taken out to private vehicle via wheelchair. [place], RN

## 2014-06-16 NOTE — Discharge Summary (Signed)
ELECTROPHYSIOLOGY PROCEDURE DISCHARGE SUMMARY    Patient ID: Suzanne Spears,  MRN: [id number], DOB/AGE: [id number] y.o.  Admit date: 06/15/2014 Discharge date: 06/16/2014  Primary Care Physician: [name], MD Primary Cardiologist: Nehemiah Massed Electrophysiologist: Lovena Le  Primary Discharge Diagnosis:  Non ischemic cardiomyopathy, LBBB, chronic systolic heart failure s/p CRTD implantation this admission  Secondary Discharge Diagnosis:  1.  Asthma 2.  GERD  Allergies  Allergen Reactions  . Coreg [Carvedilol]     UNKNOWN  . Metoprolol Other (See Comments)    Swelling and headache  . Penicillin G     Other reaction(s): Localized superficial swelling of skin  . Adhesive [Tape] Rash  . Latex Rash  . Levofloxacin Rash  . Penicillins Rash     Procedures This Admission:  1.  Implantation of a MDT CRTD on 06-15-14 by Dr Lovena Le. See op note for full details.  The procedure was notable for a persistent left SVC.  DFT's were deferred at time of implant.  There were no immediate post procedure complications. 2.  CXR on 06-16-14 demonstrated no pneumothorax status post device implantation.   Brief HPI: Suzanne Spears is a 54 y.o. female was referred to electrophysiology in the outpatient setting for consideration of ICD implantation.  Past medical history includes non ischemic cardiomyopathy, LBBB, chronic systolic heart failure.  The patient has persistent LV dysfunction despite guideline directed therapy.  Risks, benefits, and alternatives to ICD implantation were reviewed with the patient who wished to proceed.   Hospital Course:  The patient was admitted and underwent implantation of a MDT CRTD with details as outlined above. She was monitored on telemetry overnight which demonstrated sinus rhythm with ventricular pacing.  Left chest was without hematoma or ecchymosis.  The device was interrogated and found to be functioning normally.  CXR was obtained and demonstrated no  pneumothorax status post device implantation.  Her LV lead was retracted some from implant but had good capture thresholds.  Wound care, arm mobility, and restrictions were reviewed with the patient.  The patient was examined and considered stable for discharge to home.   The patient's discharge medications include a beta blocker (propranolol). She has not been able to tolerate ACE-I due to hypotension.  Physical Exam: Filed Vitals:   06/15/14 1445 06/15/14 1645 06/15/14 2022 06/16/14 0558  BP: 109/83 111/75 121/73 122/80  Pulse: 88 92 89 85  Temp:   97.8 F (36.6 C) 98.3 F (36.8 C)  TempSrc:   Oral Oral  Resp:   18 18  Height:      Weight:      SpO2:   98% 98%    GEN- The patient is well appearing, alert and oriented x 3 today.   HEENT: normocephalic, atraumatic; sclera clear, conjunctiva pink; hearing intact; oropharynx clear; neck supple, no JVP Lymph- no cervical lymphadenopathy Lungs- Clear to ausculation bilaterally, normal work of breathing.  No wheezes, rales, rhonchi Heart- Regular rate and rhythm, no murmurs, rubs or gallops  GI- soft, non-tender, non-distended, bowel sounds present  Extremities- no clubbing, cyanosis, or edema; DP/PT/radial pulses 2+ bilaterally MS- no significant deformity or atrophy Skin- warm and dry, no rash or lesion, left chest without hematoma/ecchymosis Psych- euthymic mood, full affect Neuro- strength and sensation are intact   Labs:   Lab Results  Component Value Date   WBC 5.8 06/05/2014   HGB 13.2 06/05/2014   HCT 39.0 06/05/2014   MCV 90.2 06/05/2014   PLT 252.0 06/05/2014  No results for input(s): NA, K, CL, CO2, BUN, CREATININE, CALCIUM, PROT, BILITOT, ALKPHOS, ALT, AST, GLUCOSE in the last 168 hours.  Invalid input(s): LABALBU  Discharge Medications:    Medication List    STOP taking these medications        diclofenac sodium 1 % Gel  Commonly known as:  VOLTAREN      TAKE these medications        albuterol 108 (90  BASE) MCG/ACT inhaler  Commonly known as:  PROVENTIL HFA;VENTOLIN HFA  Inhale 2 puffs into the lungs daily as needed for wheezing or shortness of breath.     cyanocobalamin 1000 MCG/ML injection  Commonly known as:  (VITAMIN B-12)  Inject 1 mL (1,000 mcg total) into the muscle once a week.     cyclobenzaprine 10 MG tablet  Commonly known as:  FLEXERIL  Take 10 mg by mouth 3 (three) times daily as needed for muscle spasms.     diazepam 5 MG tablet  Commonly known as:  VALIUM  Take 1 tablet (5 mg total) by mouth every 6 (six) hours as needed.     DSS 100 MG Caps  Take 100 mg by mouth 2 (two) times daily.     DULoxetine 60 MG capsule  Commonly known as:  CYMBALTA  Take 1 capsule (60 mg total) by mouth daily.     fexofenadine 180 MG tablet  Commonly known as:  ALLEGRA  Take 180 mg by mouth at bedtime.     fluticasone 50 MCG/ACT nasal spray  Commonly known as:  FLONASE  Place 2 sprays into the nose at bedtime.     HYDROcodone-acetaminophen 5-325 MG per tablet  Commonly known as:  NORCO/VICODIN  Take 1 tablet by mouth every 6 (six) hours as needed for moderate pain.     magnesium 30 MG tablet  Take 30 mg by mouth daily.     Melatonin 3 MG Tabs  Take 5 mg by mouth at bedtime as needed.     montelukast 10 MG tablet  Commonly known as:  SINGULAIR  Take 1 tablet (10 mg total) by mouth at bedtime.     norethindrone-ethinyl estradiol 1-20 MG-MCG tablet  Commonly known as:  GILDESS FE 1/20  Take 1 tablet by mouth daily.     propranolol 20 MG tablet  Commonly known as:  INDERAL  Take 1 tablet (20 mg total) by mouth 3 (three) times daily.     spironolactone 25 MG tablet  Commonly known as:  ALDACTONE  Take 1 tablet (25 mg total) by mouth daily.     topiramate 50 MG tablet  Commonly known as:  TOPAMAX  Take 1 tablet (50 mg total) by mouth at bedtime.     traZODone 50 MG tablet  Commonly known as:  DESYREL  Take 0.5-1 tablets (25-50 mg total) by mouth at bedtime as  needed for sleep.     valACYclovir 1000 MG tablet  Commonly known as:  VALTREX  Take 1 tablet (1,000 mg total) by mouth 2 (two) times daily.        Disposition:  Discharge Instructions    Diet - low sodium heart healthy    Complete by:  As directed      Increase activity slowly    Complete by:  As directed           Follow-up Information    Follow up with [facility] On 06/22/2014.   Why:  at 9:30AM   Contact  information:   [address] [place] [address]       Duration of Discharge Encounter: Greater than 30 minutes including physician time.  Signed, Chanetta Marshall, NP 06/16/2014 8:45 AM  EP Attending  Patient seen and examined. Agree with above findings and plan. Her BiV ICD is working normally. [place] for discharge with usual followup.  Mikle Bosworth.D.

## 2014-06-19 LAB — SURGICAL PATHOLOGY

## 2014-06-22 ENCOUNTER — Encounter: Payer: Self-pay | Admitting: *Deleted

## 2014-06-22 ENCOUNTER — Ambulatory Visit (INDEPENDENT_AMBULATORY_CARE_PROVIDER_SITE_OTHER): Payer: BLUE CROSS/BLUE SHIELD | Admitting: *Deleted

## 2014-06-22 ENCOUNTER — Encounter: Payer: Self-pay | Admitting: Internal Medicine

## 2014-06-22 DIAGNOSIS — I420 Dilated cardiomyopathy: Secondary | ICD-10-CM

## 2014-06-22 DIAGNOSIS — I5022 Chronic systolic (congestive) heart failure: Secondary | ICD-10-CM

## 2014-06-22 DIAGNOSIS — Z95810 Presence of automatic (implantable) cardiac defibrillator: Secondary | ICD-10-CM

## 2014-06-22 DIAGNOSIS — I429 Cardiomyopathy, unspecified: Secondary | ICD-10-CM

## 2014-06-22 LAB — MDC_IDC_ENUM_SESS_TYPE_INCLINIC
Battery Remaining Longevity: 92 mo
Battery Voltage: 3.01 V
Brady Statistic AP VP Percent: 0.02 %
Brady Statistic AP VS Percent: 0.02 %
Brady Statistic AS VP Percent: 98.7 %
Brady Statistic AS VS Percent: 1.26 %
Brady Statistic RA Percent Paced: 0.04 %
Brady Statistic RV Percent Paced: 6.91 %
Date Time Interrogation Session: 20160428110109
HighPow Impedance: 152 Ohm
HighPow Impedance: 63 Ohm
Lead Channel Impedance Value: 266 Ohm
Lead Channel Impedance Value: 285 Ohm
Lead Channel Impedance Value: 342 Ohm
Lead Channel Impedance Value: 437 Ohm
Lead Channel Impedance Value: 456 Ohm
Lead Channel Pacing Threshold Amplitude: 0.5 V
Lead Channel Pacing Threshold Amplitude: 1.25 V
Lead Channel Pacing Threshold Pulse Width: 0.4 ms
Lead Channel Pacing Threshold Pulse Width: 0.4 ms
Lead Channel Sensing Intrinsic Amplitude: 2.375 mV
Lead Channel Sensing Intrinsic Amplitude: 25.375 mV
Lead Channel Sensing Intrinsic Amplitude: 26.5 mV
Lead Channel Sensing Intrinsic Amplitude: 3.375 mV
Lead Channel Setting Pacing Amplitude: 2.5 V
Lead Channel Setting Pacing Amplitude: 3.5 V
Lead Channel Setting Pacing Amplitude: 3.5 V
Lead Channel Setting Pacing Pulse Width: 0.4 ms
Lead Channel Setting Pacing Pulse Width: 0.8 ms
Lead Channel Setting Sensing Sensitivity: 0.3 mV
Zone Setting Detection Interval: 300 ms
Zone Setting Detection Interval: 350 ms
Zone Setting Detection Interval: 360 ms
Zone Setting Detection Interval: 450 ms

## 2014-06-22 NOTE — Progress Notes (Signed)
Wound check appointment. Steri-strips removed. Removed 1 suture. Wound without redness or edema. Incision edges approximated, wound well healed. Normal device function. Thresholds, sensing, and impedances consistent with implant measurements. Device programmed at 3.5V for extra safety margin until 3 month visit. Histogram distribution appropriate for patient and level of activity. No mode switches or ventricular arrhythmias noted. Changed RV pulse width from 0.4 to 0.34ms. Changed LV output from 2.25 to 2.5V. Patient educated about wound care, arm mobility, lifting restrictions, shock plan. ROV w/ SK/B 10/10/14.

## 2014-06-25 NOTE — Discharge Summary (Signed)
PATIENT NAME:  Suzanne Spears, Suzanne Spears MR#:  [id number] DATE OF BIRTH:  12-27-1960  DATE OF ADMISSION:  03/03/2014 DATE OF DISCHARGE:  03/10/2014  DISCHARGE DIAGNOSES:  1.  Right multilobar pneumonia with small right pleural effusion.  2.  History of chronic systolic heart failure with ejection fraction of 25% -30%.  3.  Hypotension.   SECONDARY DIAGNOSES:   heart failure with EF 25-30 % waiting for ICD, history of idiopathic cardiomyopathy, history of asthma.   CONSULTATIONS:  1.  Cardiology.  2.  Pulmonary, Dr. Wallene Huh.    PROCEDURES AND RADIOLOGY:  1.  Chest x-ray on January 10 showed right lower lobe pneumonia.  2.  Chest x-ray on January 12 showed right lower lobe pneumonia, tiny right pleural effusion.  3.  CT scan of the chest without contrast on January  13 showed right lung multilobar pneumonia with small right pleural effusion. Reactive right hilar and mediastinal lymph nodes.   MAJOR LABORATORY PANEL:  1.  Blood cultures x 2 were negative on January 8.  2.  Urinalysis on January 12 was negative.   HISTORY AND SHORT HOSPITAL COURSE: The patient is a 54 year old female with the above-mentioned medical problems who was admitted for shortness of breath and cough. She was found to have pneumonia along with congestive heart failure exacerbation. Please see Dr. Governor Specking dictated history and physical for further details. Pulmonary consultation was obtained with Dr. Wallene Huh who recommended CT scan of the chest, which was obtained confirming significant pneumonia, which was slowly improving with IV antibiotic. The patient was also found to have component of heart failure with EF of 25%-30%. The patient remained somewhat hypotensive for which her blood pressure medication including Coreg and Aldactone were held. By January 15 she was feeling much better, close to her baseline and was discharged home in stable condition.   PERTINENT  PHYSICAL EXAMINATION ON DISCHARGE:  VITAL  SIGNS: On the date of discharge, temperature 98.2, heart rate 80 per minute, respirations 18 per minute, blood pressure 120/81. She was saturating 92% on room air.  CARDIOVASCULAR: S1, S2 normal. No murmurs, rubs, or gallop.  LUNGS: Clear to auscultation bilaterally. No wheezing, rales, rhonchi, crepitation.  ABDOMEN: Soft, benign.  NEUROLOGIC: Nonfocal examination. All other physical examination remained at baseline.   DISCHARGE MEDICATIONS:   Medication Instructions  allegra 180 mg oral tablet  1 tab(s) orally once a day (at bedtime)    montelukast 10 mg oral tablet  1 tab(s) orally once a day (in the evening)   cambia potassium 50 mg oral powder for reconstitution  1 dose(s) orally once a day, As Needed - for Migraine   cyanocobalamin 1000 mcg/ml injectable solution  1 dose(s) injectable once a week   cymbalta 60 mg oral delayed release capsule  1 cap(s) orally once a day (in the morning)   cyclobenzaprine 10 mg oral tablet  1 tab(s) orally once a day (at bedtime), As Needed for muscle spasms.    fluticasone nasal 50 mcg/inh nasal spray  1 spray(s) nasal once a day (at bedtime), As Needed for rhinitis.    acetaminophen-hydrocodone 325 mg-5 mg oral tablet  1 tab(s) orally every 6 hours, As Needed - for Pain   magnesium gluconate 500 mg oral tablet  1 tab(s) orally once a day (at bedtime)   topiramate 50 mg oral tablet  2 tab(s) orally once a day (at bedtime)   valtrex 500 mg oral tablet  1 tab(s) orally once a day, As Needed for  fever blisters.    ventolin cfc free 90 mcg/inh inhalation aerosol  2 puff(s) inhaled 4 times a day, As Needed - for Wheezing, for Shortness of Breath    melatonin 3 mg oral tablet  1 tab(s) orally once (at bedtime)   gildess fe 1/20  1 tab(s) orally once a day (at bedtime)   ceftin 500 mg oral tablet  1 tab(s) orally 2 times a day x 5 days     DISCHARGE DIET: Regular.   DISCHARGE ACTIVITY: As tolerated.   DISCHARGE INSTRUCTIONS AND FOLLOWUP: The patient was  instructed to follow up with her primary care physician, Dr. Deborra Medina, in 1-2 weeks. She will need followup with Dr. Wallene Huh in 2-4 weeks. She was set up to follow up at [facility] on January 29 at 9:00 a.m. as scheduled.   TOTAL TIME DISCHARGING THIS PATIENT: 45 minutes.    ____________________________ Lucina Mellow. Manuella Ghazi, MD vss:bm D: 03/10/2014 22:57:03 ET T: 03/11/2014 00:02:11 ET JOB#: [id number]  cc: Alzena Gerber S. Manuella Ghazi, MD, <Dictator> Deborra Medina, MD Herbon E. Raul Del, MD Lucina Mellow [facility] MD ELECTRONICALLY SIGNED 03/16/2014 10:11

## 2014-06-25 NOTE — Op Note (Signed)
PATIENT NAME:  Suzanne Spears, Suzanne Spears MR#:  [id number] DATE OF BIRTH:  [birth year]  DATE OF PROCEDURE:  05/17/2014  PREOPERATIVE DIAGNOSIS:  Thrombosed vein, left wrist, causing pain.   POSTOPERATIVE DIAGNOSIS:  Thrombosed vein, left wrist, causing pain.   PROCEDURE PERFORMED:  Excision of thrombosed vein, left wrist.   SURGEON:  Algernon Huxley, MD   ANESTHESIA:  MAC.   ESTIMATED BLOOD LOSS:  Minimal.   INDICATION FOR PROCEDURE:  This is a 54 year old female who presented to my office with a thrombosed vein in her left wrist. She had 2 areas of superficial thrombophlebitis after IVs after a recent hospitalization. The one in her proximal arm has resolved and is asymptomatic. However, she has a large nodule from the thrombosed vein in her wrist. 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 It has not improved after many weeks of conservative therapy.  DESCRIPTION OF PROCEDURE:  The patient was brought to the operative suite, and after an adequate level of intravenous sedation was obtained, the left arm was sterilely prepped and draped and a sterile surgical field was created. A small incision was created overlying the thrombosed vein at the wrist. This was dissected out and ligated both proximally and distally and then excised and sent to pathology. The wound was then closed with a single 3-0 Vicryl and a 4-0 Monocryl. Dermabond was placed as a dressing. The patient was awakened from anesthesia and taken to the recovery room in stable condition.    ____________________________ Algernon Huxley, MD jsd:nb D: 05/17/2014 13:44:37 ET T: 05/18/2014 02:10:30 ET JOB#: [id number]  cc: Algernon Huxley, MD, <Dictator> Algernon Huxley MD ELECTRONICALLY SIGNED 05/29/2014 15:48

## 2014-06-25 NOTE — H&P (Signed)
PATIENT NAME:  Suzanne Spears, Suzanne Spears MR#:  [id number] DATE OF BIRTH:  01/16/1961  DATE OF ADMISSION:  03/03/2014  PRIMARY CARE DOCTOR: Teresa Tullo, MD  EMERGENCY ROOM PHYSICIAN: Rebecca L. Lord, MD  CHIEF COMPLAINT: Shortness of breath and cough.   HISTORY OF PRESENT ILLNESS: A 53-year-old female patient with a history of chronic congestive heart failure, EF (25% %, is in the process of getting defibrillator. Comes in because of cough and fever. The patient went to see Dr. Tullo yesterday and she went because she was feeling very tired for the past few days, but did not have any fever. The patient went and then yesterday, the flu test was negative at doctor's office. But since last night, she had a fever, cough, and body aches. Because of that, she went to [facility] today and she had an x-ray done, which showed right-sided pneumonia. Because of that, she was sent into the Emergency Room. In the ER, the patient was tachycardic up to 120 and temperature is 102 Fahrenheit. The patient is being evaluated in the ER and the patient is referred for overnight observation. The patient has a lot of dry cough and body aches. Oxygen saturation is (92% on room air. Initially, she had tachycardia at 120 beats per minute. She received about a liter of IV fluids, normal saline and the repeat heart rate is around 99. The patient did not get Rocephin yet, but she got azithromycin 1 dose. She denies any chest pain. No extremity swelling. No other complaints.   PAST MEDICAL HISTORY: Significant for recently diagnosed heart failure with EF 25% % and she sees Dr. Kowalski and she is referred to [facility] for ICD placement and supposed to get ICD next week. She also had a history of MVP, LVP, history of idiopathic cardiomyopathy, history of asthma.  ALLERGIES: ALLERGIC TO PENICILLIN CAUSES SOME SWELLING. LEVAQUIN CAUSES RASH. THE PATIENT SAYS SHE DID NOT TAKE LEVAQUIN A LONG TIME AND SHE THINKS SHE HAD RASH, BUT SHE  IS NOT SURE OF SIDE EFFECTS.  PAST SURGICAL HISTORY: She had C-section, appendectomy, history of turbinectomy.   FAMILY HISTORY: Grandmother had diabetes, aunt had diabetes.   SOCIAL HISTORY: No smoking. No drinking. 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  MEDICATIONS: 1.  Percocet 5/325 one tablet every 6 hours. 2.  Allegra 180 mg p.o. daily. 3.  Amlodipine 2.5 mg p.o. at bedtime. 4.  Azithromycin 250 mg for 4 days. 5.  The patient started on azithromycin this morning. 6.  The patient is on Cambia 50 mg p.o. as needed daily.  7.  Flexeril 10 mg p.o. at bedtime. 8.  Cyanocobalamin 1 g injectable once a week. 9.  Cymbalta 60 mg p.o. daily. 10.  Fluticasone nasal spray 50 mcg at bedtime.  11.  Magnesium gluconate 500 mg p.o. daily. 12.  Melatonin 3 mg p.o. at bedtime. 13.  The patient is also on Topamax 50 mg at bedtime. 14.  Singulair 10 mg in the evening. 15.  Aldactone 25 mg as needed for edema. 16.  Valtrex 500 mg daily as needed for fever blisters. 17. Ventolin 90 mcg inhalation 1 puff every 4 hours as needed for wheezing.  REVIEW OF SYSTEMS: CONSTITUTIONAL: Feels tired, had fever today.  EYES: No blurred vision. No double vision.  ENT: No tinnitus. No ear pain. No epistaxis. No difficulty swallowing.  RESPIRATORY: Has cough since last night. The patient has cough and shortness of breath and fever today. CARDIOVASCULAR: No chest pain. No orthopnea.   No PND. No palpitations.  GASTROINTESTINAL: No nausea. No vomiting. No abdominal pain.  GENITOURINARY: No dysuria or hematuria.  HEMATOLOGIC: No anemia.  ENDOCRINE: No polyuria or nocturia.  MUSCULOSKELETAL: No joint pain.  NEUROLOGIC: No history of strokes. Has a history of migraines. PSYCHIATRIC: No anxiety or insomnia.  PHYSICAL EXAMINATION: VITAL SIGNS: Temperature 102.2, heart rate110,, blood pressure 129/88, saturation92% is % on room air. GENERAL: She is an alert, awake, oriented 54 year old female, not in acute distress.  answered questions appropriately.  HEENT: Head atraumatic, normocephalic. Eyes: Pupils equal, reacting to light. Extraocular movements intact. No scleral icterus. No conjunctivitis. Hearing is intact. Pharynx: No pharyngeal erythema.  NECK: Supple. No JVD. No carotid bruit. Normal range of motion. No lymphadenopathy.  RESPIRATORY: Clear to auscultation. The patient has no wheezing, no crepitations. No respiratory distress.  CARDIOVASCULAR: S1, S2 regular, tachycardic..pulses equal at carotid,femoral, pedal pulse. No extremity edema.  ABDOMEN: Soft, nontender, nondistended. Bowel sounds present. No hernias.  MUSCULOSKELETAL: A 5/5 strength in upper and lower extremities. Gait is steady.  SKIN: No skin rashes. LYMPHATICS: No lymphadenopathy in cervical or axillary regions.  NEUROLOGIC: Cranial nerves II through XII intact. DTRs 2+ bilaterally.  PSYCHIATRIC: Motor and affect are within normal limits. No dysarthria or aphasia. Oriented to time, place, person. Judgment and memory are intact.   LABORATORY DATA: CBC:BC PROFILE                                         WBC                        10.1                 3.6-11.0       x10^3/mm^3        RBC                        4.76                 3.80-5.20      X10^6/mm^3        HGB                        14.7                 12.0-16.0      g/dL              HCT                        45.2                 35.0-47.0      %                 MCV                        95                   80-100         fL                MCH                        30.8  26.0-34.0      pg                MCHC                       32.5                 32.0-36.0      g/dL              RDW                        14.3                 11.5-14.5      %                 PLATELET                   207                  150-440        x10^3/mm^3        NEUTROPHIL %               86.1                 -              %                 LYMPHOCYTE %               7.7                   -              %                 MONOCYTE %                 5.7                  -              %                 EOSINOPHIL %               0.1                  -              %                 BASOPHIL %                 0.4                  -              %                 NEUTROPHIL #           HI  8.7                  1.4-6.5        x10^3/mm^3        LYMPHOCYTE #           LO  0.8                    1.0-3.6        x10^3/mm^3        MONOCYTE #                 0.6                  0.2-0.9        x10^3/mm^         EOSINOPHIL #               0.0                  0.0-0.7        x10^3/mm^3        BASOPHIL #                 0.0                  0.0-0.1        x10^3/mm^3      CHEM 7' GLUCOSE                    87                   65-99          mg/dL             BUN                        11                   7-18           mg/dL             CREATININE                 0.95                 0.60-1.30      mg/dL             SODIUM                 LO  134                  136-145        mmol/L            POTASSIUM                  4.3                  3.5-5.1        mmol/L            CHLORIDE                   105                  98-107         mmol/L            CO2                    LO  19                   21-32          mmol/L            CALCIUM, TOTAL  8.7                  8.5-10.1       mg/dL             SGOT (AST)             HI  71                   15-37          Unit/L            SGPT (ALT)                 48                   14-63          U/L                                                              NOTE: New Reference Range                                                  09/13/13   ALKALINE PHOSPHATASE   HI  131                  46-116         Unit/L                                                           NOTE: New Reference Range                                                  09/13/13   ALBUMIN                LO  3.3                  3.4-5.0        g/dL               TOTAL PROTEIN              7.8                  6.4-8.2        g/dL              BILIRUBIN,TOTAL            0.4                  0.2-1.0        mg/dL             OSMOLALITY                 267  275-301                          ANION GAP                  10                   7-16                             eGFR (AFRICAN AMERICAN     >60                  >60mL/min                        eGFR (NON-AFRICAN AMER     >60   Troponin less than 0.02 Chest x-ray done from [facility] walk-in showed right-sided pneumonia.   EKG: Normal sinus rhythm at (99 beats per minute.   ASSESSMENT AND PLAN: 1.  The patient is a 53-year-old female patient with cough, fever, has right-sided pneumonia. The patient had tachycardia initially. So we are going to be admitted to admit her to hospitalist service for overnight observation. The patient is getting Rocephin, Zithromax and some nebulizers as needed. The patient's blood cultures will be followed. If negative, she will be discharged home with Levaquin tomorrow.  2.  History of chronic systolic heart failure with recently diagnosed ejection fraction of (25%>% pending implantable cardiac defibrillator placement. The patient will be monitored (on  telemetry. Continue the Aldactone as needed. The patient is not on beta blockers, unsure why. We could add Coreg 3.125 b.i.d. and the patient's other diagnoses include history of migraines, history of allergies, history of B12 deficiency.   TIME SPENT: About 55 minutes.   ____________________________ Snehalatha Konidena, MD sk:sw D: 03/03/2014 17:10:17 ET T: 03/03/2014 17:38:45 ET JOB#: [id number]  cc: Snehalatha Konidena, MD, <Dictator>  SNEHALATHA KONIDENA MD ELECTRONICALLY SIGNED 03/29/2014 19:00 

## 2014-07-18 ENCOUNTER — Telehealth: Payer: Self-pay

## 2014-07-18 NOTE — Telephone Encounter (Signed)
Patient notified

## 2014-07-18 NOTE — Telephone Encounter (Signed)
Dr Manuella Ghazi was trreating her headaches with topomax,  If that was helping and she wants to resume it i can call it in.

## 2014-07-18 NOTE — Telephone Encounter (Signed)
The patient stated she is having migraines.  She is hoping for a recommendation on what do to, or if there is a medication she can take.

## 2014-07-18 NOTE — Telephone Encounter (Signed)
I have scheduled patient for Thursday at 11.15. patient stated she has had 3 migraines in the last week and some numbness to side of face patient refused ER, patient would like MD recommendation.

## 2014-07-20 ENCOUNTER — Ambulatory Visit: Payer: BLUE CROSS/BLUE SHIELD | Admitting: Internal Medicine

## 2014-09-02 ENCOUNTER — Other Ambulatory Visit: Payer: Self-pay | Admitting: Internal Medicine

## 2014-09-04 ENCOUNTER — Other Ambulatory Visit: Payer: Self-pay

## 2014-09-04 MED ORDER — DULOXETINE HCL 60 MG PO CPEP: 60.0000 mg | ORAL_CAPSULE | Freq: Every day | ORAL | Status: DC

## 2014-09-05 ENCOUNTER — Encounter: Payer: Self-pay | Admitting: Internal Medicine

## 2014-09-06 ENCOUNTER — Telehealth: Payer: Self-pay | Admitting: Internal Medicine

## 2014-09-06 NOTE — Telephone Encounter (Signed)
Pt dropped off FMLA papers. Paper's in Dr. Lupita Dawn box/msn

## 2014-09-06 NOTE — Telephone Encounter (Signed)
Placed FMLA in red folder

## 2014-09-08 ENCOUNTER — Other Ambulatory Visit: Payer: Self-pay | Admitting: Neurology

## 2014-09-08 DIAGNOSIS — I10 Essential (primary) hypertension: Secondary | ICD-10-CM | POA: Insufficient documentation

## 2014-09-08 DIAGNOSIS — H5462 Unqualified visual loss, left eye, normal vision right eye: Secondary | ICD-10-CM

## 2014-09-11 DIAGNOSIS — Z7689 Persons encountering health services in other specified circumstances: Secondary | ICD-10-CM

## 2014-09-11 NOTE — Telephone Encounter (Signed)
.  fmla form has been completed.  Needs copy prior to pickup  chARGE $50

## 2014-09-12 NOTE — Telephone Encounter (Signed)
Patient notified FMLA ready for pick up and copy to scan and chart.

## 2014-09-13 ENCOUNTER — Ambulatory Visit
Admission: RE | Admit: 2014-09-13 | Discharge: 2014-09-13 | Disposition: A | Discharge status: Home / Self Care | Payer: BLUE CROSS/BLUE SHIELD | Source: Ambulatory Visit | Attending: Neurology | Admitting: Neurology

## 2014-09-13 DIAGNOSIS — G43909 Migraine, unspecified, not intractable, without status migrainosus: Secondary | ICD-10-CM | POA: Diagnosis present

## 2014-09-13 DIAGNOSIS — H53122 Transient visual loss, left eye: Secondary | ICD-10-CM | POA: Insufficient documentation

## 2014-09-13 DIAGNOSIS — M47812 Spondylosis without myelopathy or radiculopathy, cervical region: Secondary | ICD-10-CM | POA: Insufficient documentation

## 2014-09-13 DIAGNOSIS — H5462 Unqualified visual loss, left eye, normal vision right eye: Secondary | ICD-10-CM

## 2014-09-13 HISTORY — DX: Essential (primary) hypertension: I10

## 2014-09-13 MED ORDER — IOHEXOL 350 MG/ML SOLN
75.0000 mL | Freq: Once | INTRAVENOUS | Status: AC | PRN
  Administered 2014-09-13: 75 mL via INTRAVENOUS

## 2014-10-06 ENCOUNTER — Other Ambulatory Visit: Payer: Self-pay | Admitting: Internal Medicine

## 2014-10-10 ENCOUNTER — Ambulatory Visit
Admission: RE | Admit: 2014-10-10 | Discharge: 2014-10-10 | Disposition: A | Discharge status: Home / Self Care | Payer: BLUE CROSS/BLUE SHIELD | Source: Ambulatory Visit | Attending: Internal Medicine | Admitting: Internal Medicine

## 2014-10-10 ENCOUNTER — Encounter: Payer: Self-pay | Admitting: Internal Medicine

## 2014-10-10 ENCOUNTER — Ambulatory Visit (INDEPENDENT_AMBULATORY_CARE_PROVIDER_SITE_OTHER): Payer: BLUE CROSS/BLUE SHIELD | Admitting: Internal Medicine

## 2014-10-10 VITALS — BP 110/70 | HR 83 | Ht 65.5 in | Wt 168.2 lb

## 2014-10-10 DIAGNOSIS — I447 Left bundle-branch block, unspecified: Secondary | ICD-10-CM

## 2014-10-10 DIAGNOSIS — I341 Nonrheumatic mitral (valve) prolapse: Secondary | ICD-10-CM

## 2014-10-10 DIAGNOSIS — I429 Cardiomyopathy, unspecified: Secondary | ICD-10-CM

## 2014-10-10 DIAGNOSIS — I5022 Chronic systolic (congestive) heart failure: Secondary | ICD-10-CM

## 2014-10-10 DIAGNOSIS — I420 Dilated cardiomyopathy: Secondary | ICD-10-CM

## 2014-10-10 DIAGNOSIS — R9431 Abnormal electrocardiogram [ECG] [EKG]: Secondary | ICD-10-CM | POA: Insufficient documentation

## 2014-10-10 LAB — CUP PACEART INCLINIC DEVICE CHECK
Brady Statistic AP VP Percent: 0.1 % — CL
Brady Statistic AP VS Percent: 0.1 % — CL
Brady Statistic AS VP Percent: 98.6 %
Brady Statistic AS VS Percent: 1.3 %
Date Time Interrogation Session: 20160816105034
HighPow Impedance: 66 Ohm
Lead Channel Impedance Value: 285 Ohm
Lead Channel Impedance Value: 437 Ohm
Lead Channel Impedance Value: 456 Ohm
Lead Channel Pacing Threshold Amplitude: 0.5 V
Lead Channel Pacing Threshold Amplitude: 0.75 V
Lead Channel Pacing Threshold Amplitude: 1.25 V
Lead Channel Pacing Threshold Pulse Width: 0.4 ms
Lead Channel Pacing Threshold Pulse Width: 0.6 ms
Lead Channel Pacing Threshold Pulse Width: 0.8 ms
Lead Channel Sensing Intrinsic Amplitude: 2.6 mV
Lead Channel Sensing Intrinsic Amplitude: 20 mV
Lead Channel Setting Pacing Amplitude: 1.5 V
Lead Channel Setting Pacing Amplitude: 2.25 V
Lead Channel Setting Pacing Amplitude: 2.5 V
Lead Channel Setting Pacing Pulse Width: 0.6 ms
Lead Channel Setting Pacing Pulse Width: 0.8 ms
Lead Channel Setting Sensing Sensitivity: 0.3 mV
Zone Setting Detection Interval: 300 ms
Zone Setting Detection Interval: 350 ms
Zone Setting Detection Interval: 360 ms
Zone Setting Detection Interval: 450 ms
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
## 2014-10-10 NOTE — Patient Instructions (Signed)
Medication Instructions: - no changes  Labwork: - none  Procedures/Testing: - A chest x-ray takes a picture of the organs and structures inside the chest, including the heart, lungs, and blood vessels. This test can show several things, including, whether the heart is enlarges; whether fluid is building up in the lungs; and whether pacemaker / defibrillator leads are still in place.  Follow-Up: - Will be dependent on the results of the chest x-ray.  Any Additional Special Instructions Will Be Listed Below (If Applicable). - none

## 2014-10-17 ENCOUNTER — Encounter: Payer: Self-pay | Admitting: Internal Medicine

## 2014-10-20 ENCOUNTER — Ambulatory Visit (INDEPENDENT_AMBULATORY_CARE_PROVIDER_SITE_OTHER): Payer: BLUE CROSS/BLUE SHIELD | Admitting: Family Medicine

## 2014-10-20 ENCOUNTER — Encounter: Payer: Self-pay | Admitting: Family Medicine

## 2014-10-20 VITALS — BP 124/82 | HR 80 | Temp 98.4°F | Ht 65.5 in | Wt 165.0 lb

## 2014-10-20 DIAGNOSIS — Z23 Encounter for immunization: Secondary | ICD-10-CM

## 2014-10-20 DIAGNOSIS — W5501XA Bitten by cat, initial encounter: Secondary | ICD-10-CM | POA: Diagnosis not present

## 2014-10-20 DIAGNOSIS — S41151A Open bite of right upper arm, initial encounter: Secondary | ICD-10-CM

## 2014-10-20 MED ORDER — METRONIDAZOLE 500 MG PO TABS: 500.0000 mg | ORAL_TABLET | Freq: Three times a day (TID) | ORAL | Status: DC

## 2014-10-20 MED ORDER — DOXYCYCLINE HYCLATE 100 MG PO TABS: 100.0000 mg | ORAL_TABLET | Freq: Two times a day (BID) | ORAL | Status: DC

## 2014-10-20 NOTE — Progress Notes (Signed)
Subjective:  Patient ID: Suzanne Spears, female    DOB: 1960/08/28  Age: 55 y.o. MRN: [id number]  CC: Cat bite  HPI:  54 year old female with complicated past medical history including dilated cardiomyopathy/CHF presents for an acute visit with complaints of a cat bite  1) Cat Bite  Patient reports that she was feeding a stray cat and subsequently got bit on her right index finger.  She states that this occurred last night.  Immediately after the bite she is the area thoroughly with soap, water, and peroxide.  She contacted and will control and the cat was taken to the local animal shelter.  She reports that she subsequently developed pain, swelling and redness of the tip of her right index finger.  No associated fevers or chills. She continues to have good range of motion of her index finger.  No relieving factors.  Patient presents today for evaluation regarding this. She's concerned about underlying infection.  Social Hx - Nonsmoker.  Review of Systems  Constitutional: Negative for fever and chills.  Gastrointestinal: Negative for nausea and vomiting.  Skin: Positive for wound.    Objective:  BP 124/82 mmHg  Pulse 80  Temp(Src) 98.4 F (36.9 C) (Oral)  Ht 5' 5.5" (1.664 m)  Wt 165 lb (74.844 kg)  BMI 27.03 kg/m2  SpO2 97%  LMP 10/14/2014  BP/Weight 10/20/2014 10/10/2014 1/44/8185  Systolic BP 631 497 026  Diastolic BP 82 70 80  Wt. (Lbs) 165 168.25 -  BMI 27.03 27.56 -   Physical Exam  Constitutional: She is oriented to person, place, and time. She appears well-developed and well-nourished. No distress.  Cardiovascular: Normal rate and regular rhythm.   Murmur heard.  Systolic murmur is present with a grade of 2/6  Pulmonary/Chest: Effort normal and breath sounds normal. No respiratory distress. She has no wheezes. She has no rales.  Neurological: She is alert and oriented to person, place, and time.  Skin:  Right index finger - tip of the right index  finger with 3 puncture wounds and surrounding edema and swelling. No drainage at this time.   Lab Results  Component Value Date   WBC 5.4 06/12/2014   HGB 13.4 06/12/2014   HCT 39.7 06/12/2014   PLT 230 06/12/2014   GLUCOSE 93 06/12/2014   CHOL 246* 12/08/2013   TRIG 126.0 12/08/2013   HDL 39.50 12/08/2013   LDLDIRECT 151.6 05/01/2011   LDLCALC 181* 12/08/2013   ALT 48 03/03/2014   AST 71* 03/03/2014   NA 140 06/12/2014   K 4.0 06/12/2014   CL 113* 06/12/2014   CREATININE 1.18* 06/12/2014   BUN 15 06/12/2014   CO2 23 06/12/2014   TSH 1.08 12/08/2013    Assessment & Plan:   Problem List Items Addressed This Visit    Cat bite    New problem. Given bite and surrounding erythema and swelling will cover empirically with doxycycline and Flagyl (given allergy to penicillin). Rx's sent. Advised patient to call/follow-up if she fails to improve or worsens.       Other Visit Diagnoses    Encounter for immunization    -  Primary       Meds ordered this encounter  Medications  . doxycycline (VIBRA-TABS) 100 MG tablet    Sig: Take 1 tablet (100 mg total) by mouth 2 (two) times daily.    Dispense:  14 tablet    Refill:  0  . metroNIDAZOLE (FLAGYL) 500 MG tablet    Sig: Take  1 tablet (500 mg total) by mouth 3 (three) times daily.    Dispense:  21 tablet    Refill:  0    Follow-up: PRN   Thersa Salt, DO

## 2014-10-20 NOTE — Patient Instructions (Addendum)
It was nice to see you today.  Take the doxy and flagyl as prescribed.  Follow up if you worsen or fail to improve.  Take care  Dr. Lacinda Axon

## 2014-10-20 NOTE — Progress Notes (Signed)
Pre visit review using our clinic review tool, if applicable. No additional management support is needed unless otherwise documented below in the visit note. 

## 2014-10-20 NOTE — Assessment & Plan Note (Signed)
New problem. Given bite and surrounding erythema and swelling will cover empirically with doxycycline and Flagyl (given allergy to penicillin). Rx's sent. Advised patient to call/follow-up if she fails to improve or worsens.

## 2014-10-25 ENCOUNTER — Telehealth: Payer: Self-pay | Admitting: *Deleted

## 2014-10-25 NOTE — Telephone Encounter (Signed)
What issues is she having?

## 2014-10-25 NOTE — Telephone Encounter (Signed)
Pt called states she was seen by Dr Lacinda Axon on 8.26.16 for a cat bite, prescribed 2 antibiotics, states she is not feeling well on the meds.  Please advise

## 2014-10-26 ENCOUNTER — Telehealth: Payer: Self-pay | Admitting: Internal Medicine

## 2014-10-26 ENCOUNTER — Telehealth: Payer: Self-pay

## 2014-10-26 NOTE — Telephone Encounter (Signed)
i called patient to see what side effects she was having, but there was no answer.

## 2014-10-26 NOTE — Telephone Encounter (Signed)
Inform patient that if the redness at site is improved it is likely okay to be without antibiotics. If not, I can send in something else or she can just take the Doxy.

## 2014-10-26 NOTE — Telephone Encounter (Signed)
Pt is on two antibiotic and it's making her nauseous and dizzy. Pt stopped taking them yesterday and she's also having acid reflux. Pt has a day and half left on both so pt wants to know should she continue at least one of them? The combo is making her sick. Thank You!

## 2014-10-26 NOTE — Telephone Encounter (Signed)
Pt was called and no answer nor did it go to vociemail. If she calls please inform her  that if the redness at site is improved it is likely okay to be without antibiotics. If not, I can send in something else or she can just take the Doxy.

## 2014-10-26 NOTE — Telephone Encounter (Signed)
Called patient and there was not answering her phone. There is no voicemail!

## 2014-11-01 ENCOUNTER — Other Ambulatory Visit: Payer: Self-pay | Admitting: Internal Medicine

## 2014-11-08 ENCOUNTER — Ambulatory Visit (INDEPENDENT_AMBULATORY_CARE_PROVIDER_SITE_OTHER): Payer: BLUE CROSS/BLUE SHIELD | Admitting: Internal Medicine

## 2014-11-08 ENCOUNTER — Encounter: Payer: Self-pay | Admitting: Internal Medicine

## 2014-11-08 VITALS — BP 110/72 | HR 91 | Ht 65.5 in | Wt 165.4 lb

## 2014-11-08 DIAGNOSIS — I429 Cardiomyopathy, unspecified: Secondary | ICD-10-CM | POA: Diagnosis not present

## 2014-11-08 DIAGNOSIS — I420 Dilated cardiomyopathy: Secondary | ICD-10-CM

## 2014-11-08 DIAGNOSIS — I447 Left bundle-branch block, unspecified: Secondary | ICD-10-CM | POA: Diagnosis not present

## 2014-11-08 DIAGNOSIS — I428 Other cardiomyopathies: Secondary | ICD-10-CM

## 2014-11-08 DIAGNOSIS — I5022 Chronic systolic (congestive) heart failure: Secondary | ICD-10-CM

## 2014-11-08 LAB — CUP PACEART INCLINIC DEVICE CHECK
Battery Remaining Longevity: 81 mo
Battery Voltage: 2.99 V
Brady Statistic AP VP Percent: 0 %
Brady Statistic AP VS Percent: 0.05 %
Brady Statistic AS VP Percent: 98.53 %
Brady Statistic AS VS Percent: 1.42 %
Brady Statistic RA Percent Paced: 0.05 %
Brady Statistic RV Percent Paced: 10.72 %
Date Time Interrogation Session: 20160914123633
HighPow Impedance: 209 Ohm
HighPow Impedance: 73 Ohm
Lead Channel Impedance Value: 323 Ohm
Lead Channel Impedance Value: 323 Ohm
Lead Channel Impedance Value: 399 Ohm
Lead Channel Impedance Value: 456 Ohm
Lead Channel Impedance Value: 494 Ohm
Lead Channel Pacing Threshold Amplitude: 0.75 V
Lead Channel Pacing Threshold Amplitude: 3.5 V
Lead Channel Pacing Threshold Pulse Width: 0.4 ms
Lead Channel Pacing Threshold Pulse Width: 0.4 ms
Lead Channel Sensing Intrinsic Amplitude: 2.125 mV
Lead Channel Sensing Intrinsic Amplitude: 2.125 mV
Lead Channel Sensing Intrinsic Amplitude: 31.625 mV
Lead Channel Sensing Intrinsic Amplitude: 31.625 mV
Lead Channel Setting Pacing Amplitude: 1.5 V
Lead Channel Setting Pacing Amplitude: 2.25 V
Lead Channel Setting Pacing Amplitude: 2.5 V
Lead Channel Setting Pacing Pulse Width: 0.6 ms
Lead Channel Setting Pacing Pulse Width: 0.8 ms
Lead Channel Setting Sensing Sensitivity: 0.3 mV
Zone Setting Detection Interval: 300 ms
Zone Setting Detection Interval: 350 ms
Zone Setting Detection Interval: 360 ms
Zone Setting Detection Interval: 450 ms
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
## 2014-11-08 NOTE — Patient Instructions (Signed)
Medication Instructions:  Your physician recommends that you continue on your current medications as directed. Please refer to the Current Medication list given to you today.  Labwork: No new orders.   Testing/Procedures: No new orders.   Follow-Up: Your physician recommends that you schedule a follow-up appointment in: 3 MONTHS with Dr Knox Saliva have been referred to Dr Cyndia Bent for discussion of epicardial lead.   Any Other Special Instructions Will Be Listed Below (If Applicable).

## 2014-11-08 NOTE — Assessment & Plan Note (Signed)
Her Biv ICD is working normally except for LV lead disfunction. We have reviewed the CXR and this demonstrates that the lead has pulled back into the left SVC.

## 2014-11-08 NOTE — Assessment & Plan Note (Signed)
Her symptoms remain class 3. She will continue her current meds. She is reflecting on referral for epicardial LV lead insertion.

## 2014-11-15 ENCOUNTER — Other Ambulatory Visit: Payer: Self-pay | Admitting: *Deleted

## 2014-11-15 ENCOUNTER — Encounter: Payer: Self-pay | Admitting: Surgery

## 2014-11-15 ENCOUNTER — Institutional Professional Consult (permissible substitution) (INDEPENDENT_AMBULATORY_CARE_PROVIDER_SITE_OTHER): Payer: BLUE CROSS/BLUE SHIELD | Admitting: Surgery

## 2014-11-15 VITALS — BP 130/80 | HR 80 | Resp 20 | Ht 65.5 in | Wt 165.0 lb

## 2014-11-15 DIAGNOSIS — I5020 Unspecified systolic (congestive) heart failure: Secondary | ICD-10-CM

## 2014-11-17 ENCOUNTER — Encounter: Payer: Self-pay | Admitting: Surgery
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
## 2014-11-22 ENCOUNTER — Telehealth: Payer: Self-pay | Admitting: Internal Medicine

## 2014-11-22 NOTE — Telephone Encounter (Signed)
Pt called stating that she is having right breast pain and needs to be seen this week.. She stated that  has an operation on 11/30/14.. please advise pt.Marland Kitchen

## 2014-11-22 NOTE — Telephone Encounter (Signed)
Yes you can schedule her for 4:30 on Friday

## 2014-11-22 NOTE — Telephone Encounter (Signed)
Can we do 4.30 Friday?

## 2014-11-23 NOTE — Telephone Encounter (Signed)
The patient has been put on the schedule.

## 2014-11-23 NOTE — Telephone Encounter (Signed)
Patient aware please add to schedule 4.30 on Friday.

## 2014-11-24 ENCOUNTER — Ambulatory Visit (INDEPENDENT_AMBULATORY_CARE_PROVIDER_SITE_OTHER): Payer: BLUE CROSS/BLUE SHIELD | Admitting: Internal Medicine

## 2014-11-24 ENCOUNTER — Encounter: Payer: Self-pay | Admitting: Internal Medicine

## 2014-11-24 VITALS — BP 112/76 | HR 94 | Temp 98.2°F | Resp 12 | Ht 65.5 in | Wt 167.4 lb

## 2014-11-24 DIAGNOSIS — N644 Mastodynia: Secondary | ICD-10-CM

## 2014-11-24 DIAGNOSIS — R05 Cough: Secondary | ICD-10-CM | POA: Diagnosis not present

## 2014-11-24 DIAGNOSIS — R059 Cough, unspecified: Secondary | ICD-10-CM

## 2014-11-24 MED ORDER — DOXYCYCLINE HYCLATE 100 MG PO CAPS: 100.0000 mg | ORAL_CAPSULE | Freq: Two times a day (BID) | ORAL | Status: DC

## 2014-11-24 MED ORDER — LIDOCAINE 5 % EX OINT: 1.0000 "application " | TOPICAL_OINTMENT | CUTANEOUS | Status: DC | PRN

## 2014-11-24 MED ORDER — LOSARTAN POTASSIUM 25 MG PO TABS: 25.0000 mg | ORAL_TABLET | Freq: Every day | ORAL | Status: DC

## 2014-11-24 NOTE — Progress Notes (Signed)
Subjective:  Patient ID: Suzanne Spears, female    DOB.  : 1961/01/02  Age: 54 y.o. MRN: [id number]  CC: The primary encounter diagnosis was Cough. A diagnosis of Breast pain, right was also pertinent to this visit.  HPI YESHA MUCHOW presents for right sided nipple pain. Feels similar to an episode of mastitis years ago when breast feeding.  but no nipple changes started Tuesday night . Tried applying warm compresses  which has helped somewhat ,  But nipple still very teder to manipulate.  Right breast  also feels heavy and has pain on the lateral side of her breast  Recent development of a dry hacking cough that is paroxymsal at times,  For the past week.    No viral symptoms.  Mild post nasal drip but taking all of her allergy medications, Cough keeping her up at night .  Was prescribed lisinopril one month ago.    No axillary lymphadenopathy  Has surgery to replace broken leads on on defibrillator  in October     Outpatient Prescriptions Prior to Visit  Medication Sig Dispense Refill  . albuterol (PROVENTIL HFA;VENTOLIN HFA) 108 (90 BASE) MCG/ACT inhaler Inhale 2 puffs into the lungs daily as needed for wheezing or shortness of breath.    . cyanocobalamin (,VITAMIN B-12,) 1000 MCG/ML injection Inject 1 mL (1,000 mcg total) into the muscle once a week. 10 mL 5  . cyclobenzaprine (FLEXERIL) 10 MG tablet Take 10 mg by mouth 3 (three) times daily as needed for muscle spasms.    . DULoxetine (CYMBALTA) 60 MG capsule TAKE (1) CAPSULE BY MOUTH EVERY DAY 30 capsule 3  . fexofenadine (ALLEGRA) 180 MG tablet Take 180 mg by mouth at bedtime.    . fluticasone (FLONASE) 50 MCG/ACT nasal spray Place 2 sprays into the nose at bedtime.    Marland Kitchen GILDESS FE 1/20 1-20 MG-MCG tablet TAKE (1) TABLET BY MOUTH EVERY DAY 1 Package 6  . magnesium 30 MG tablet Take 30 mg by mouth daily.    . Melatonin 3 MG TABS Take 3 mg by mouth at bedtime as needed.     . montelukast (SINGULAIR) 10 MG tablet Take 1 tablet  (10 mg total) by mouth at bedtime. 30 tablet 6  . NON FORMULARY Fiber Gummy Takes 1 gummy daily.    Marland Kitchen spironolactone (ALDACTONE) 25 MG tablet Take 1 tablet (25 mg total) by mouth daily. 30 tablet 3  . topiramate (TOPAMAX) 50 MG tablet Take 1 tablet (50 mg total) by mouth at bedtime. (Patient taking differently: Take 100 mg by mouth at bedtime. ) 30 tablet 1  . traZODone (DESYREL) 50 MG tablet TAKE 1/2-1 TABLET BY MOUTH AT BEDTIME ASNEEDED FOR SLEEP 30 tablet 3  . valACYclovir (VALTREX) 1000 MG tablet Take 1 tablet (1,000 mg total) by mouth 2 (two) times daily. (Patient taking differently: Take 1,000 mg by mouth 2 (two) times daily as needed (fever blisters). ) 20 tablet 0  . lisinopril (PRINIVIL,ZESTRIL) 2.5 MG tablet Take 2.5 mg by mouth daily.     No facility-administered medications prior to visit.    Review of Systems;  Patient denies headache, fevers, malaise, unintentional weight loss, skin rash, eye pain, sinus congestion and sinus pain, sore throat, dysphagia,  hemoptysis , cough, dyspnea, wheezing, chest pain, palpitations, orthopnea, edema, abdominal pain, nausea, melena, diarrhea, constipation, flank pain, dysuria, hematuria, urinary  Frequency, nocturia, numbness, tingling, seizures,  Focal weakness, Loss of consciousness,  Tremor, insomnia, depression, anxiety, and suicidal  ideation.      Objective:  BP 112/76 mmHg  Pulse 94  Temp(Src) 98.2 F (36.8 C) (Oral)  Resp 12  Ht 5' 5.5" (1.664 m)  Wt 167 lb 6 oz (75.921 kg)  BMI 27.42 kg/m2  SpO2 99%  LMP 11/03/2014 (Approximate)  BP Readings from Last 3 Encounters:  11/24/14 112/76  11/15/14 130/80  11/08/14 110/72    Wt Readings from Last 3 Encounters:  11/24/14 167 lb 6 oz (75.921 kg)  11/15/14 165 lb (74.844 kg)  11/08/14 165 lb 6.4 oz (75.025 kg)    General appearance: alert, cooperative and appears stated age Ears: normal TM's and external ear canals both ears Throat: lips, mucosa, and tongue normal; teeth and  gums normal Neck: no adenopathy, no carotid bruit, supple, symmetrical, trachea midline and thyroid not enlarged, symmetric, no tenderness/mass/nodules Back: symmetric, no curvature. ROM normal. No CVA tenderness. Lungs: clear to auscultation bilaterally Heart: regular rate and rhythm, S1, S2 normal, no murmur, click, rub or gallop Abdomen: soft, non-tender; bowel sounds normal; no masses,  no organomegaly Pulses: 2+ and symmetric Skin: Skin color, texture, turgor normal. No rashes or lesions Lymph nodes: Cervical, supraclavicular, and axillary nodes normal.  No results found for: HGBA1C  Lab Results  Component Value Date   CREATININE 1.18* 06/12/2014   CREATININE 1.12 06/05/2014   CREATININE 1.21 03/09/2014    Lab Results  Component Value Date   WBC 5.4 06/12/2014   HGB 13.4 06/12/2014   HCT 39.7 06/12/2014   PLT 230 06/12/2014   GLUCOSE 93 06/12/2014   CHOL 246* 12/08/2013   TRIG 126.0 12/08/2013   HDL 39.50 12/08/2013   LDLDIRECT 151.6 05/01/2011   LDLCALC 181* 12/08/2013   ALT 48 03/03/2014   AST 71* 03/03/2014   NA 140 06/12/2014   K 4.0 06/12/2014   CL 113* 06/12/2014   CREATININE 1.18* 06/12/2014   BUN 15 06/12/2014   CO2 23 06/12/2014   TSH 1.08 12/08/2013    Dg Chest 2 View  10/31/2014   ADDENDUM REPORT: 10/31/2014 08:18  ADDENDUM: The reason for this exam was abnormal EKG and history of AICD placement.   Electronically Signed   By: Marcello Moores  Register   On: 10/31/2014 08:18   10/31/2014   CLINICAL DATA:  06/16/2014 .  EXAM: CHEST  2 VIEW  COMPARISON:  06/16/2014.  FINDINGS: Mediastinum and hilar structures normal. Lungs are clear. Cardiac pacer with lead tips in right atrium right ventricle. No pleural effusion or pneumothorax. Prior cervical spine fusion.  IMPRESSION: No acute cardiopulmonary disease.  Electronically Signed: By: Marcello Moores  Register On: 10/10/2014 12:17    Assessment & Plan:   Problem List Items Addressed This Visit    Cough - Primary    Suspect  lisinopril induced given recent initiation advised to suspend,  Losartan 25 mg instead      Breast pain, right    Exam normal except for very sensative nipple, slightly red.  Empiric treatment of mastitis with doxycycline.          I have discontinued Ms. Cammarano's lisinopril. I am also having her start on losartan, doxycycline, and lidocaine. Additionally, I am having her maintain her fexofenadine, magnesium, montelukast, fluticasone, albuterol, topiramate, Melatonin, spironolactone, valACYclovir, cyanocobalamin, cyclobenzaprine, traZODone, GILDESS FE 1/20, NON FORMULARY, and DULoxetine.  Meds ordered this encounter  Medications  . losartan (COZAAR) 25 MG tablet    Sig: Take 1 tablet (25 mg total) by mouth daily.    Dispense:  30 tablet  Refill:  1  . doxycycline (VIBRAMYCIN) 100 MG capsule    Sig: Take 1 capsule (100 mg total) by mouth 2 (two) times daily. With food    Dispense:  14 capsule    Refill:  0  . lidocaine (XYLOCAINE) 5 % ointment    Sig: Apply 1 application topically as needed.    Dispense:  35.44 g    Refill:  0    Medications Discontinued During This Encounter  Medication Reason  . lisinopril (PRINIVIL,ZESTRIL) 2.5 MG tablet     Follow-up: No Follow-up on file.   [name], MD

## 2014-11-24 NOTE — Progress Notes (Signed)
Pre-visit discussion using our clinic review tool. No additional management support is needed unless otherwise documented below in the visit note.  

## 2014-11-24 NOTE — Patient Instructions (Signed)
Stop the lisinopril ,  It may be causing the dry cough you are having .  I have called in losartan to take instead for your heart.   The cough from lisinopril may last a month  Doxycycline twice daily to treat nipple inflammation possibly from a staph infection   Lidocaine ointment as needed

## 2014-11-24 NOTE — Assessment & Plan Note (Signed)
Exam normal except for very sensative nipple, slightly red.  Empiric treatment of mastitis with doxycycline.

## 2014-11-24 NOTE — Assessment & Plan Note (Signed)
Suspect lisinopril induced given recent initiation advised to suspend,  Losartan 25 mg instead

## 2014-11-28 ENCOUNTER — Encounter (HOSPITAL_COMMUNITY)
Admission: RE | Admit: 2014-11-28 | Discharge: 2014-11-28 | Disposition: A | Discharge status: Home / Self Care | Payer: BLUE CROSS/BLUE SHIELD | Source: Ambulatory Visit | Attending: Surgery | Admitting: Surgery

## 2014-11-28 ENCOUNTER — Other Ambulatory Visit: Payer: Self-pay

## 2014-11-28 ENCOUNTER — Ambulatory Visit (HOSPITAL_COMMUNITY)
Admission: RE | Admit: 2014-11-28 | Discharge: 2014-11-28 | Disposition: A | Discharge status: Home / Self Care | Payer: BLUE CROSS/BLUE SHIELD | Source: Ambulatory Visit | Attending: Surgery | Admitting: Surgery

## 2014-11-28 ENCOUNTER — Encounter (HOSPITAL_COMMUNITY): Payer: Self-pay

## 2014-11-28 VITALS — BP 119/62 | HR 80 | Temp 98.2°F | Resp 18 | Ht 65.5 in | Wt 168.3 lb

## 2014-11-28 DIAGNOSIS — J45909 Unspecified asthma, uncomplicated: Secondary | ICD-10-CM

## 2014-11-28 DIAGNOSIS — I10 Essential (primary) hypertension: Secondary | ICD-10-CM | POA: Insufficient documentation

## 2014-11-28 DIAGNOSIS — I5020 Unspecified systolic (congestive) heart failure: Secondary | ICD-10-CM | POA: Insufficient documentation

## 2014-11-28 DIAGNOSIS — Z01818 Encounter for other preprocedural examination: Secondary | ICD-10-CM

## 2014-11-28 DIAGNOSIS — I447 Left bundle-branch block, unspecified: Secondary | ICD-10-CM | POA: Insufficient documentation

## 2014-11-28 DIAGNOSIS — Z950 Presence of cardiac pacemaker: Secondary | ICD-10-CM

## 2014-11-28 DIAGNOSIS — E785 Hyperlipidemia, unspecified: Secondary | ICD-10-CM

## 2014-11-28 DIAGNOSIS — I420 Dilated cardiomyopathy: Secondary | ICD-10-CM

## 2014-11-28 DIAGNOSIS — Z79899 Other long term (current) drug therapy: Secondary | ICD-10-CM | POA: Insufficient documentation

## 2014-11-28 DIAGNOSIS — Z0183 Encounter for blood typing: Secondary | ICD-10-CM | POA: Insufficient documentation

## 2014-11-28 DIAGNOSIS — I341 Nonrheumatic mitral (valve) prolapse: Secondary | ICD-10-CM

## 2014-11-28 DIAGNOSIS — Z01812 Encounter for preprocedural laboratory examination: Secondary | ICD-10-CM

## 2014-11-28 DIAGNOSIS — K219 Gastro-esophageal reflux disease without esophagitis: Secondary | ICD-10-CM

## 2014-11-28 HISTORY — DX: Presence of cardiac pacemaker: Z95.0

## 2014-11-28 HISTORY — DX: Cough, unspecified: R05.9

## 2014-11-28 HISTORY — DX: Personal history of other infectious and parasitic diseases: Z86.19

## 2014-11-28 HISTORY — DX: Anemia, unspecified: D64.9

## 2014-11-28 HISTORY — DX: Reserved for inherently not codable concepts without codable children: IMO0001

## 2014-11-28 HISTORY — DX: Dizziness and giddiness: R42

## 2014-11-28 HISTORY — DX: Personal history of other diseases of the respiratory system: Z87.09

## 2014-11-28 HISTORY — DX: Cough: R05

## 2014-11-28 HISTORY — DX: Other reaction to spinal and lumbar puncture: G97.1

## 2014-11-28 HISTORY — DX: Polyneuropathy, unspecified: G62.9

## 2014-11-28 HISTORY — DX: Pneumonia, unspecified organism: J18.9

## 2014-11-28 HISTORY — DX: Depression, unspecified: F32.A

## 2014-11-28 HISTORY — DX: Hyperlipidemia, unspecified: E78.5

## 2014-11-28 HISTORY — DX: Major depressive disorder, single episode, unspecified: F32.9

## 2014-11-28 LAB — APTT: aPTT: 28 seconds (ref 24–37)

## 2014-11-28 LAB — CBC
HCT: 39.5 % (ref 36.0–46.0)
Hemoglobin: 13 g/dL (ref 12.0–15.0)
MCH: 31 pg (ref 26.0–34.0)
MCHC: 32.9 g/dL (ref 30.0–36.0)
MCV: 94 fL (ref 78.0–100.0)
Platelets: 178 10*3/uL (ref 150–400)
RBC: 4.2 MIL/uL (ref 3.87–5.11)
RDW: 13.6 % (ref 11.5–15.5)
WBC: 6.1 10*3/uL (ref 4.0–10.5)

## 2014-11-28 LAB — COMPREHENSIVE METABOLIC PANEL
ALT: 12 U/L — ABNORMAL LOW (ref 14–54)
AST: 18 U/L (ref 15–41)
Albumin: 3.6 g/dL (ref 3.5–5.0)
Alkaline Phosphatase: 65 U/L (ref 38–126)
Anion gap: 10 (ref 5–15)
BUN: 17 mg/dL (ref 6–20)
CO2: 21 mmol/L — ABNORMAL LOW (ref 22–32)
Calcium: 9.3 mg/dL (ref 8.9–10.3)
Chloride: 108 mmol/L (ref 101–111)
Creatinine, Ser: 1.21 mg/dL — ABNORMAL HIGH (ref 0.44–1.00)
GFR calc Af Amer: 58 mL/min — ABNORMAL LOW (ref >60)
GFR calc non Af Amer: 50 mL/min — ABNORMAL LOW (ref >60)
Glucose, Bld: 91 mg/dL (ref 65–99)
Potassium: 4.1 mmol/L (ref 3.5–5.1)
Sodium: 139 mmol/L (ref 135–145)
Total Bilirubin: 0.3 mg/dL (ref 0.3–1.2)
Total Protein: 6.2 g/dL — ABNORMAL LOW (ref 6.5–8.1)

## 2014-11-28 LAB — ABO/RH: ABO/RH(D): O POS

## 2014-11-28 LAB — URINALYSIS, ROUTINE W REFLEX MICROSCOPIC
Bilirubin Urine: NEGATIVE
Glucose, UA: NEGATIVE mg/dL
Hgb urine dipstick: NEGATIVE
Ketones, ur: NEGATIVE mg/dL
Leukocytes, UA: NEGATIVE
Nitrite: NEGATIVE
Protein, ur: NEGATIVE mg/dL
Specific Gravity, Urine: 1.016 (ref 1.005–1.030)
Urobilinogen, UA: 0.2 mg/dL (ref 0.0–1.0)
pH: 7.5 (ref 5.0–8.0)

## 2014-11-28 LAB — TYPE AND SCREEN
ABO/RH(D): O POS
Antibody Screen: NEGATIVE

## 2014-11-28 LAB — PROTIME-INR
INR: 1.11 (ref 0.00–1.49)
Prothrombin Time: 14.5 seconds (ref 11.6–15.2)

## 2014-11-28 LAB — HCG, SERUM, QUALITATIVE: Preg, Serum: NEGATIVE

## 2014-11-28 LAB — SURGICAL PCR SCREEN
MRSA, PCR: NEGATIVE
Staphylococcus aureus: NEGATIVE

## 2014-11-28 NOTE — Progress Notes (Addendum)
Anesthesia Chart Review:  Pt is 54 year old female scheduled for L chest thoracotomy, epicardial pacing lead placement on 11/30/2014 with Dr. Cyndia Bent.   Cardiologist is Dr. Serafina Royals at [place] (care everywhere). PCP is Dr. Deborra Medina who is aware of upcoming surgery, last office visit 11/24/14 .   PMH includes: Nonischemic dilated cardiomyopathy, LBBB, CHF, AICD, mitral valve prolapse syndrome, HTN, hyperlipidemia, pericarditis, asthma, GERD. Never smoker. BMI 28. S/p 2 level ACDF 10/21/12.  Medications include: albuterol, losartan, spironolactone, topiramate  Preoperative labs reviewed.    Chest x-ray 11/28/2014 reviewed. No acute cardiopulmonary disease.   EKG 11/28/2014: NSR. Possible LAE. LBBB  Echo 02/06/2014: -dyssynchrony is present. The degree of dyssynchrony is mild.  -The region(s) of latest contraction: Base Lateral, Base Posterior 3D acquisition and reconstructions were performed as part of this examination to more accurately quantify the effects of reduced left ventricular ejection fraction. -Additional Comments: Dyssynchrony is present, however the "Classic" Dyssynchrony pattern is not seen. There are delays in Posterior and Lateral walls.  Cardiac cath [facility]) 01/17/2014: -Progressive angina and worsening CHF and cardiomyopathy with known valve disease -moderate to severe global dysfunction, EF 25-30% with LBBB -Normal coronary arteries with 0% stenosis  Echo with bubble study 01/02/2014:  -Severe LV systolic dysfunction with estimate EF 25% -Normal RV systolic function -Moderate mitral valve insufficiency -Mild tricuspid and aortic valve insufficiency -No valvular stenosis -Moderate LV enlargement -Moderate LA enlargement -Mild MVP -Bubble study without shunting  If no changes, I anticipate pt can proceed with surgery as scheduled.   Willeen Cass, FNP-BC [facility]/Anesthesiology Phone: [phone] 11/29/2014 1:06 PM

## 2014-11-28 NOTE — Progress Notes (Signed)
Left a message for Marcia,rep with [employer] to notify her of arrival time 0530 for surgery to start at 0730.

## 2014-11-28 NOTE — Progress Notes (Addendum)
Cardiologist is Gigi Gin in [place] with last visit in Aug 2016 to be requested   Medical Md is Dr.Teresa Derrel Nip  Echo reports in epic from 2012/2015  Stress test to be requested from Cardiologist  Heart cath report in epic from 2006/2015  Note in epic from [place] on 11/08/14  TEE report to be requested from Cardiologist

## 2014-11-28 NOTE — Pre-Procedure Instructions (Signed)
Suzanne Spears  11/28/2014      [place], [place] [address] Phone: [phone] Fax: [place], [place] - [place] [address] Phone: [phone] Fax: [phone]    Your procedure is scheduled on Thurs, Oct 6 @ 7:30 AM  Report to [place] at 5:30 AM  Call this number if you have problems the morning of surgery:  [phone]   Remember:  Do not eat food or drink liquids after midnight.  Take these medicines the morning of surgery with A SIP OF WATER Albuterol<Bring Your Inhaler With You>,Doxycyline(Vibramycin),and Cymbalta(Duloxetine)              No Goody's,BC's,Aleve,Aspirin,Ibuprofen,Fish Oil,or any Herbal Medications.   Do not wear jewelry, make-up or nail polish.  Do not wear lotions, powders, or perfumes.    Do not shave 48 hours prior to surgery.    Do not bring valuables to the hospital.  [facility] is not responsible for any belongings or valuables.  Contacts, dentures or bridgework may not be worn into surgery.  Leave your suitcase in the car.  After surgery it may be brought to your room.  For patients admitted to the hospital, discharge time will be determined by your treatment team.  Patients discharged the day of surgery will not be allowed to drive home.    Special instructions:  [place] - Preparing for Surgery  Before surgery, you can play an important role.  Because skin is not sterile, your skin needs to be as free of germs as possible.  You can reduce the number of germs on you skin by washing with CHG (chlorahexidine gluconate) soap before surgery.  CHG is an antiseptic cleaner which kills germs and bonds with the skin to continue killing germs even after washing.  Please DO NOT use if you have an allergy to CHG or antibacterial soaps.  If your skin becomes  reddened/irritated stop using the CHG and inform your nurse when you arrive at Short Stay.  Do not shave (including legs and underarms) for at least 48 hours prior to the first CHG shower.  You may shave your face.  Please follow these instructions carefully:   1.  Shower with CHG Soap the night before surgery and the                                morning of Surgery.  2.  If you choose to wash your hair, wash your hair first as usual with your       normal shampoo.  3.  After you shampoo, rinse your hair and body thoroughly to remove the                      Shampoo.  4.  Use CHG as you would any other liquid soap.  You can apply chg directly       to the skin and wash gently with scrungie or a clean washcloth.  5.  Apply the CHG Soap to your body ONLY FROM THE NECK DOWN.        Do not use on open wounds or open sores.  Avoid contact with your eyes,       ears, mouth and genitals (private parts).  Wash genitals (private parts)       with your normal soap.  6.  Wash thoroughly, paying special attention to the area where your surgery        will be performed.  7.  Thoroughly rinse your body with warm water from the neck down.  8.  DO NOT shower/wash with your normal soap after using and rinsing off       the CHG Soap.  9.  Pat yourself dry with a clean towel.            10.  Wear clean pajamas.            11.  Place clean sheets on your bed the night of your first shower and do not        sleep with pets.  Day of Surgery  Do not apply any lotions/deoderants the morning of surgery.  Please wear clean clothes to the hospital/surgery center.    Please read over the following fact sheets that you were given. Pain Booklet, Coughing and Deep Breathing, Blood Transfusion Information, MRSA Information and Surgical Site Infection Prevention

## 2014-11-29 MED ORDER — VANCOMYCIN HCL IN DEXTROSE 1-5 GM/200ML-% IV SOLN
1000.0000 mg | INTRAVENOUS | Status: AC
  Administered 2014-11-30: 1000 mg via INTRAVENOUS
  Filled 2014-11-29: qty 200

## 2014-11-29 NOTE — Anesthesia Preprocedure Evaluation (Addendum)
Anesthesia Evaluation  Patient identified by MRN, date of birth, ID band Patient awake    Reviewed: Allergy & Precautions, NPO status , Patient's Chart, lab work & pertinent test results  History of Anesthesia Complications Negative for: history of anesthetic complications  Airway Mallampati: II  TM Distance: >3 FB Neck ROM: Full    Dental  (+) Dental Advisory Given   Pulmonary asthma (inhalers) ,    breath sounds clear to auscultation       Cardiovascular hypertension, Pt. on medications +CHF  + pacemaker (BiV pacer: LV lead no longer working) + Cardiac Defibrillator (has never shocked pt) + Valvular Problems/Murmurs MR and MVP  Rhythm:Regular Rate:Normal  '15 ECHO: EF 25%, MVP with mod MR, mild AI, mild TR '06 cath: normal coronaries   Neuro/Psych Depression negative neurological ROS     GI/Hepatic Neg liver ROS, GERD  Medicated and Controlled,  Endo/Other  negative endocrine ROS  Renal/GU Renal InsufficiencyRenal disease (creat 1.2)     Musculoskeletal   Abdominal   Peds  Hematology negative hematology ROS (+)   Anesthesia Other Findings   Reproductive/Obstetrics 11/28/14 preg test: NEG                         Anesthesia Physical Anesthesia Plan  ASA: III  Anesthesia Plan: General   Post-op Pain Management:    Induction: Intravenous  Airway Management Planned: Oral ETT  Additional Equipment: Arterial line, CVP and Ultrasound Guidance Line Placement  Intra-op Plan:   Post-operative Plan: Extubation in OR  Informed Consent: I have reviewed the patients History and Physical, chart, labs and discussed the procedure including the risks, benefits and alternatives for the proposed anesthesia with the patient or authorized representative who has indicated his/her understanding and acceptance.   Dental advisory given  Plan Discussed with: CRNA and Surgeon  Anesthesia Plan  Comments: (Plan routine monitors, A line, Central line, GETA )        Anesthesia Quick Evaluation

## 2014-11-30 ENCOUNTER — Inpatient Hospital Stay (HOSPITAL_COMMUNITY)
Admission: RE | Admit: 2014-11-30 | Discharge: 2014-12-05 | DRG: 265 | Disposition: A | Discharge status: Home / Self Care | Payer: BLUE CROSS/BLUE SHIELD | Source: Ambulatory Visit | Attending: Surgery | Admitting: Surgery

## 2014-11-30 ENCOUNTER — Encounter (HOSPITAL_COMMUNITY)
Admission: RE | Disposition: A | Discharge status: Home / Self Care | Payer: Self-pay | Source: Ambulatory Visit | Attending: Surgery

## 2014-11-30 ENCOUNTER — Inpatient Hospital Stay (HOSPITAL_COMMUNITY): Payer: BLUE CROSS/BLUE SHIELD

## 2014-11-30 ENCOUNTER — Inpatient Hospital Stay (HOSPITAL_COMMUNITY): Payer: BLUE CROSS/BLUE SHIELD | Admitting: Emergency Medicine

## 2014-11-30 ENCOUNTER — Encounter (HOSPITAL_COMMUNITY): Payer: Self-pay | Admitting: *Deleted

## 2014-11-30 ENCOUNTER — Inpatient Hospital Stay (HOSPITAL_COMMUNITY): Payer: BLUE CROSS/BLUE SHIELD | Admitting: Anesthesiology

## 2014-11-30 DIAGNOSIS — K219 Gastro-esophageal reflux disease without esophagitis: Secondary | ICD-10-CM | POA: Diagnosis present

## 2014-11-30 DIAGNOSIS — M79661 Pain in right lower leg: Secondary | ICD-10-CM | POA: Diagnosis not present

## 2014-11-30 DIAGNOSIS — J45909 Unspecified asthma, uncomplicated: Secondary | ICD-10-CM | POA: Diagnosis present

## 2014-11-30 DIAGNOSIS — Z981 Arthrodesis status: Secondary | ICD-10-CM | POA: Diagnosis not present

## 2014-11-30 DIAGNOSIS — D649 Anemia, unspecified: Secondary | ICD-10-CM | POA: Diagnosis present

## 2014-11-30 DIAGNOSIS — T82120A Displacement of cardiac electrode, initial encounter: Principal | ICD-10-CM | POA: Diagnosis present

## 2014-11-30 DIAGNOSIS — R000 Tachycardia, unspecified: Secondary | ICD-10-CM

## 2014-11-30 DIAGNOSIS — I429 Cardiomyopathy, unspecified: Secondary | ICD-10-CM | POA: Diagnosis not present

## 2014-11-30 DIAGNOSIS — R0602 Shortness of breath: Secondary | ICD-10-CM | POA: Diagnosis present

## 2014-11-30 DIAGNOSIS — Z9889 Other specified postprocedural states: Secondary | ICD-10-CM

## 2014-11-30 DIAGNOSIS — Z79899 Other long term (current) drug therapy: Secondary | ICD-10-CM

## 2014-11-30 DIAGNOSIS — I447 Left bundle-branch block, unspecified: Secondary | ICD-10-CM | POA: Diagnosis present

## 2014-11-30 DIAGNOSIS — E785 Hyperlipidemia, unspecified: Secondary | ICD-10-CM | POA: Diagnosis present

## 2014-11-30 DIAGNOSIS — Y831 Surgical operation with implant of artificial internal device as the cause of abnormal reaction of the patient, or of later complication, without mention of misadventure at the time of the procedure: Secondary | ICD-10-CM | POA: Diagnosis present

## 2014-11-30 DIAGNOSIS — I5020 Unspecified systolic (congestive) heart failure: Secondary | ICD-10-CM

## 2014-11-30 DIAGNOSIS — R0781 Pleurodynia: Secondary | ICD-10-CM | POA: Diagnosis not present

## 2014-11-30 DIAGNOSIS — F329 Major depressive disorder, single episode, unspecified: Secondary | ICD-10-CM | POA: Diagnosis present

## 2014-11-30 DIAGNOSIS — I5022 Chronic systolic (congestive) heart failure: Secondary | ICD-10-CM

## 2014-11-30 DIAGNOSIS — I110 Hypertensive heart disease with heart failure: Secondary | ICD-10-CM | POA: Diagnosis present

## 2014-11-30 DIAGNOSIS — Z7951 Long term (current) use of inhaled steroids: Secondary | ICD-10-CM | POA: Diagnosis not present

## 2014-11-30 DIAGNOSIS — I2699 Other pulmonary embolism without acute cor pulmonale: Secondary | ICD-10-CM

## 2014-11-30 DIAGNOSIS — I420 Dilated cardiomyopathy: Secondary | ICD-10-CM | POA: Diagnosis present

## 2014-11-30 HISTORY — DX: Chronic systolic (congestive) heart failure: I50.22

## 2014-11-30 HISTORY — PX: THORACOTOMY: SHX5074

## 2014-11-30 HISTORY — PX: EPICARDIAL PACING LEAD PLACEMENT: SHX6274

## 2014-11-30 LAB — POCT I-STAT 7, (LYTES, BLD GAS, ICA,H+H)
Acid-base deficit: 6 mmol/L — ABNORMAL HIGH (ref 0.0–2.0)
Bicarbonate: 18.6 mEq/L — ABNORMAL LOW (ref 20.0–24.0)
Calcium, Ion: 1.28 mmol/L — ABNORMAL HIGH (ref 1.12–1.23)
HCT: 38 % (ref 36.0–46.0)
Hemoglobin: 12.9 g/dL (ref 12.0–15.0)
O2 Saturation: 99 %
Patient temperature: 36
Potassium: 3.9 mmol/L (ref 3.5–5.1)
Sodium: 140 mmol/L (ref 135–145)
TCO2: 20 mmol/L (ref 0–100)
pCO2 arterial: 31 mmHg — ABNORMAL LOW (ref 35.0–45.0)
pH, Arterial: 7.381 (ref 7.350–7.450)
pO2, Arterial: 136 mmHg — ABNORMAL HIGH (ref 80.0–100.0)

## 2014-11-30 LAB — GLUCOSE, CAPILLARY
Glucose-Capillary: 113 mg/dL — ABNORMAL HIGH (ref 65–99)
Glucose-Capillary: 165 mg/dL — ABNORMAL HIGH (ref 65–99)

## 2014-11-30 SURGERY — THORACOTOMY, MAJOR
Anesthesia: General | Site: Chest

## 2014-11-30 MED ORDER — SODIUM CHLORIDE 0.9 % IJ SOLN: 9.0000 mL | INTRAMUSCULAR | Status: DC | PRN

## 2014-11-30 MED ORDER — HYDROMORPHONE HCL 1 MG/ML IJ SOLN
0.2500 mg | INTRAMUSCULAR | Status: DC | PRN
  Administered 2014-11-30 (×2): 0.5 mg via INTRAVENOUS

## 2014-11-30 MED ORDER — DULOXETINE HCL 60 MG PO CPEP
60.0000 mg | ORAL_CAPSULE | Freq: Every day | ORAL | Status: DC
  Administered 2014-12-01 – 2014-12-05 (×5): 60 mg via ORAL
  Filled 2014-11-30 (×5): qty 1

## 2014-11-30 MED ORDER — MIDAZOLAM HCL 5 MG/5ML IJ SOLN
INTRAMUSCULAR | Status: DC | PRN
  Administered 2014-11-30: 2 mg via INTRAVENOUS

## 2014-11-30 MED ORDER — MONTELUKAST SODIUM 10 MG PO TABS
10.0000 mg | ORAL_TABLET | Freq: Every day | ORAL | Status: DC
  Administered 2014-12-01 – 2014-12-04 (×5): 10 mg via ORAL
  Filled 2014-11-30 (×6): qty 1

## 2014-11-30 MED ORDER — POTASSIUM CHLORIDE 10 MEQ/50ML IV SOLN: 10.0000 meq | Freq: Every day | INTRAVENOUS | Status: DC | PRN

## 2014-11-30 MED ORDER — FENTANYL CITRATE (PF) 100 MCG/2ML IJ SOLN
INTRAMUSCULAR | Status: DC | PRN
  Administered 2014-11-30 (×2): 50 ug via INTRAVENOUS
  Administered 2014-11-30: 200 ug via INTRAVENOUS
  Administered 2014-11-30 (×2): 50 ug via INTRAVENOUS
  Administered 2014-11-30: 100 ug via INTRAVENOUS

## 2014-11-30 MED ORDER — LIDOCAINE HCL (CARDIAC) 20 MG/ML IV SOLN
INTRAVENOUS | Status: AC
  Filled 2014-11-30: qty 5

## 2014-11-30 MED ORDER — LORATADINE 10 MG PO TABS
10.0000 mg | ORAL_TABLET | Freq: Every day | ORAL | Status: DC
  Administered 2014-11-30 – 2014-12-05 (×6): 10 mg via ORAL
  Filled 2014-11-30 (×6): qty 1

## 2014-11-30 MED ORDER — METOCLOPRAMIDE HCL 5 MG/ML IJ SOLN
10.0000 mg | Freq: Four times a day (QID) | INTRAMUSCULAR | Status: DC
  Administered 2014-11-30 – 2014-12-01 (×3): 10 mg via INTRAVENOUS
  Filled 2014-11-30 (×7): qty 2

## 2014-11-30 MED ORDER — ONDANSETRON HCL 4 MG/2ML IJ SOLN
INTRAMUSCULAR | Status: DC | PRN
  Administered 2014-11-30: 4 mg via INTRAVENOUS

## 2014-11-30 MED ORDER — DIPHENHYDRAMINE HCL 12.5 MG/5ML PO ELIX
12.5000 mg | ORAL_SOLUTION | Freq: Four times a day (QID) | ORAL | Status: DC | PRN
  Filled 2014-11-30: qty 5

## 2014-11-30 MED ORDER — ONDANSETRON HCL 4 MG/2ML IJ SOLN
INTRAMUSCULAR | Status: AC
  Filled 2014-11-30: qty 2

## 2014-11-30 MED ORDER — MEPERIDINE HCL 25 MG/ML IJ SOLN: 6.2500 mg | INTRAMUSCULAR | Status: DC | PRN

## 2014-11-30 MED ORDER — PHENYLEPHRINE HCL 10 MG/ML IJ SOLN
10.0000 mg | INTRAVENOUS | Status: DC | PRN
  Administered 2014-11-30: 25 ug/min via INTRAVENOUS

## 2014-11-30 MED ORDER — PHENYLEPHRINE 40 MCG/ML (10ML) SYRINGE FOR IV PUSH (FOR BLOOD PRESSURE SUPPORT)
PREFILLED_SYRINGE | INTRAVENOUS | Status: AC
  Filled 2014-11-30: qty 10

## 2014-11-30 MED ORDER — SENNOSIDES-DOCUSATE SODIUM 8.6-50 MG PO TABS
1.0000 | ORAL_TABLET | Freq: Every day | ORAL | Status: DC
Start: 2014-11-30 — End: 2014-12-05
  Administered 2014-12-01 – 2014-12-04 (×5): 1 via ORAL
  Filled 2014-11-30 (×6): qty 1

## 2014-11-30 MED ORDER — KCL IN DEXTROSE-NACL 20-5-0.45 MEQ/L-%-% IV SOLN
INTRAVENOUS | Status: DC
  Administered 2014-11-30: 50 mL/h via INTRAVENOUS
  Filled 2014-11-30: qty 1000

## 2014-11-30 MED ORDER — DIPHENHYDRAMINE HCL 50 MG/ML IJ SOLN
INTRAMUSCULAR | Status: DC | PRN
  Administered 2014-11-30: 25 mg via INTRAVENOUS

## 2014-11-30 MED ORDER — ENOXAPARIN SODIUM 40 MG/0.4ML ~~LOC~~ SOLN
40.0000 mg | Freq: Every day | SUBCUTANEOUS | Status: DC
  Administered 2014-12-01 – 2014-12-02 (×2): 40 mg via SUBCUTANEOUS
  Filled 2014-11-30 (×2): qty 0.4

## 2014-11-30 MED ORDER — ONDANSETRON HCL 4 MG/2ML IJ SOLN: 4.0000 mg | Freq: Four times a day (QID) | INTRAMUSCULAR | Status: DC | PRN

## 2014-11-30 MED ORDER — MIDAZOLAM HCL 2 MG/2ML IJ SOLN: 0.5000 mg | Freq: Once | INTRAMUSCULAR | Status: DC | PRN

## 2014-11-30 MED ORDER — DIPHENHYDRAMINE HCL 50 MG/ML IJ SOLN: 12.5000 mg | Freq: Four times a day (QID) | INTRAMUSCULAR | Status: DC | PRN

## 2014-11-30 MED ORDER — GLYCOPYRROLATE 0.2 MG/ML IJ SOLN
INTRAMUSCULAR | Status: DC | PRN
  Administered 2014-11-30: .4 mg via INTRAVENOUS

## 2014-11-30 MED ORDER — EPHEDRINE SULFATE 50 MG/ML IJ SOLN
INTRAMUSCULAR | Status: AC
  Filled 2014-11-30: qty 1

## 2014-11-30 MED ORDER — ACETAMINOPHEN 160 MG/5ML PO SOLN: 1000.0000 mg | Freq: Four times a day (QID) | ORAL | Status: DC

## 2014-11-30 MED ORDER — ALBUTEROL SULFATE (2.5 MG/3ML) 0.083% IN NEBU: 2.5000 mg | INHALATION_SOLUTION | RESPIRATORY_TRACT | Status: DC | PRN

## 2014-11-30 MED ORDER — NALOXONE HCL 0.4 MG/ML IJ SOLN: 0.4000 mg | INTRAMUSCULAR | Status: DC | PRN

## 2014-11-30 MED ORDER — BISACODYL 5 MG PO TBEC
10.0000 mg | DELAYED_RELEASE_TABLET | Freq: Every day | ORAL | Status: DC
  Administered 2014-11-30 – 2014-12-05 (×6): 10 mg via ORAL
  Filled 2014-11-30 (×6): qty 2

## 2014-11-30 MED ORDER — KCL IN DEXTROSE-NACL 20-5-0.45 MEQ/L-%-% IV SOLN
INTRAVENOUS | Status: AC
  Filled 2014-11-30: qty 1000

## 2014-11-30 MED ORDER — PROPOFOL 10 MG/ML IV BOLUS
INTRAVENOUS | Status: AC
  Filled 2014-11-30: qty 20

## 2014-11-30 MED ORDER — FENTANYL 10 MCG/ML IV SOLN
INTRAVENOUS | Status: DC
  Administered 2014-11-30 (×2): via INTRAVENOUS
  Administered 2014-11-30: 135 ug via INTRAVENOUS
  Administered 2014-12-01: 180 ug via INTRAVENOUS
  Administered 2014-12-01: 105 ug via INTRAVENOUS
  Administered 2014-12-01: 253.9 ug via INTRAVENOUS
  Filled 2014-11-30 (×3): qty 50

## 2014-11-30 MED ORDER — FENTANYL CITRATE (PF) 250 MCG/5ML IJ SOLN
INTRAMUSCULAR | Status: AC
  Filled 2014-11-30: qty 5

## 2014-11-30 MED ORDER — HYDROMORPHONE HCL 1 MG/ML IJ SOLN
INTRAMUSCULAR | Status: AC
  Filled 2014-11-30: qty 1

## 2014-11-30 MED ORDER — TRAMADOL HCL 50 MG PO TABS
50.0000 mg | ORAL_TABLET | Freq: Four times a day (QID) | ORAL | Status: DC | PRN
  Administered 2014-12-02 – 2014-12-03 (×3): 100 mg via ORAL
  Filled 2014-11-30 (×3): qty 2

## 2014-11-30 MED ORDER — NEOSTIGMINE METHYLSULFATE 10 MG/10ML IV SOLN
INTRAVENOUS | Status: DC | PRN
  Administered 2014-11-30: 3 mg via INTRAVENOUS

## 2014-11-30 MED ORDER — MIDAZOLAM HCL 2 MG/2ML IJ SOLN
INTRAMUSCULAR | Status: AC
  Filled 2014-11-30: qty 4

## 2014-11-30 MED ORDER — PROPOFOL 10 MG/ML IV BOLUS
INTRAVENOUS | Status: DC | PRN
  Administered 2014-11-30: 100 mg via INTRAVENOUS

## 2014-11-30 MED ORDER — FLUTICASONE PROPIONATE 50 MCG/ACT NA SUSP
2.0000 | Freq: Every day | NASAL | Status: DC
  Administered 2014-12-01 – 2014-12-05 (×5): 2 via NASAL
  Filled 2014-11-30 (×2): qty 16

## 2014-11-30 MED ORDER — ACETAMINOPHEN 500 MG PO TABS
1000.0000 mg | ORAL_TABLET | Freq: Four times a day (QID) | ORAL | Status: DC
  Administered 2014-11-30 – 2014-12-01 (×4): 1000 mg via ORAL
  Filled 2014-11-30 (×8): qty 2

## 2014-11-30 MED ORDER — VANCOMYCIN HCL IN DEXTROSE 1-5 GM/200ML-% IV SOLN
1000.0000 mg | Freq: Two times a day (BID) | INTRAVENOUS | Status: AC
  Administered 2014-11-30: 1000 mg via INTRAVENOUS
  Filled 2014-11-30: qty 200

## 2014-11-30 MED ORDER — 0.9 % SODIUM CHLORIDE (POUR BTL) OPTIME
TOPICAL | Status: DC | PRN
  Administered 2014-11-30: 2000 mL

## 2014-11-30 MED ORDER — ROCURONIUM BROMIDE 50 MG/5ML IV SOLN
INTRAVENOUS | Status: AC
  Filled 2014-11-30: qty 1

## 2014-11-30 MED ORDER — SODIUM CHLORIDE 0.9 % IJ SOLN
INTRAMUSCULAR | Status: AC
  Filled 2014-11-30: qty 10

## 2014-11-30 MED ORDER — PROMETHAZINE HCL 25 MG/ML IJ SOLN
6.2500 mg | INTRAMUSCULAR | Status: DC | PRN
Start: 2014-11-30 — End: 2014-11-30

## 2014-11-30 MED ORDER — INSULIN ASPART 100 UNIT/ML ~~LOC~~ SOLN
0.0000 [IU] | SUBCUTANEOUS | Status: DC
  Administered 2014-11-30: 4 [IU] via SUBCUTANEOUS

## 2014-11-30 MED ORDER — OXYCODONE HCL 5 MG PO TABS
5.0000 mg | ORAL_TABLET | ORAL | Status: DC | PRN
  Administered 2014-11-30: 5 mg via ORAL
  Administered 2014-12-01: 10 mg via ORAL
  Administered 2014-12-01: 5 mg via ORAL
  Administered 2014-12-01 – 2014-12-04 (×5): 10 mg via ORAL
  Filled 2014-11-30 (×4): qty 2
  Filled 2014-11-30: qty 1
  Filled 2014-11-30 (×2): qty 2
  Filled 2014-11-30: qty 1

## 2014-11-30 MED ORDER — ROCURONIUM BROMIDE 100 MG/10ML IV SOLN
INTRAVENOUS | Status: DC | PRN
Start: 2014-11-30 — End: 2014-11-30
  Administered 2014-11-30: 50 mg via INTRAVENOUS

## 2014-11-30 MED ORDER — LACTATED RINGERS IV SOLN
INTRAVENOUS | Status: DC | PRN
  Administered 2014-11-30: 07:00:00 via INTRAVENOUS

## 2014-11-30 SURGICAL SUPPLY — 69 items
BENZOIN TINCTURE PRP APPL 2/3 (GAUZE/BANDAGES/DRESSINGS) IMPLANT
BLADE SURG 11 STRL SS (BLADE) ×3 IMPLANT
CANISTER SUCTION 2500CC (MISCELLANEOUS) ×6 IMPLANT
CATH KIT ON Q 5IN SLV (PAIN MANAGEMENT) IMPLANT
CATH THORACIC 28FR (CATHETERS) ×3 IMPLANT
CATH THORACIC 36FR (CATHETERS) IMPLANT
CATH THORACIC 36FR RT ANG (CATHETERS) IMPLANT
CLIP TI MEDIUM 6 (CLIP) ×3 IMPLANT
CONN ST 1/4X3/8  BEN (MISCELLANEOUS)
CONN ST 1/4X3/8 BEN (MISCELLANEOUS) IMPLANT
CONN Y 3/8X3/8X3/8  BEN (MISCELLANEOUS)
CONN Y 3/8X3/8X3/8 BEN (MISCELLANEOUS) IMPLANT
CONT SPEC 4OZ CLIKSEAL STRL BL (MISCELLANEOUS) ×6 IMPLANT
COVER SURGICAL LIGHT HANDLE (MISCELLANEOUS) ×6 IMPLANT
DERMABOND ADVANCED (GAUZE/BANDAGES/DRESSINGS) ×1
DERMABOND ADVANCED .7 DNX12 (GAUZE/BANDAGES/DRESSINGS) ×2 IMPLANT
DRAPE INCISE IOBAN 66X45 STRL (DRAPES) ×6 IMPLANT
DRAPE LAPAROSCOPIC ABDOMINAL (DRAPES) ×3 IMPLANT
DRAPE WARM FLUID 44X44 (DRAPE) ×3 IMPLANT
DRILL BIT 7/64X5 (BIT) IMPLANT
ELECT BLADE 4.0 EZ CLEAN MEGAD (MISCELLANEOUS) ×3
ELECT REM PT RETURN 9FT ADLT (ELECTROSURGICAL) ×3
ELECTRODE BLDE 4.0 EZ CLN MEGD (MISCELLANEOUS) ×2 IMPLANT
ELECTRODE REM PT RTRN 9FT ADLT (ELECTROSURGICAL) ×2 IMPLANT
GAUZE SPONGE 4X4 12PLY STRL (GAUZE/BANDAGES/DRESSINGS) ×3 IMPLANT
GLOVE EUDERMIC 7 POWDERFREE (GLOVE) ×6 IMPLANT
GOWN STRL REUS W/ TWL LRG LVL3 (GOWN DISPOSABLE) ×4 IMPLANT
GOWN STRL REUS W/ TWL XL LVL3 (GOWN DISPOSABLE) ×2 IMPLANT
GOWN STRL REUS W/TWL LRG LVL3 (GOWN DISPOSABLE) ×2
GOWN STRL REUS W/TWL XL LVL3 (GOWN DISPOSABLE) ×1
KIT BASIN OR (CUSTOM PROCEDURE TRAY) ×3 IMPLANT
KIT ROOM TURNOVER OR (KITS) ×3 IMPLANT
LEAD PACING EPI (Prosthesis & Implant Heart) ×6 IMPLANT
NS IRRIG 1000ML POUR BTL (IV SOLUTION) ×12 IMPLANT
PACK CHEST (CUSTOM PROCEDURE TRAY) ×3 IMPLANT
PAD ARMBOARD 7.5X6 YLW CONV (MISCELLANEOUS) ×6 IMPLANT
SEALANT SURG COSEAL 4ML (VASCULAR PRODUCTS) IMPLANT
SEALANT SURG COSEAL 8ML (VASCULAR PRODUCTS) IMPLANT
SOLUTION ANTI FOG 6CC (MISCELLANEOUS) ×3 IMPLANT
SPONGE GAUZE 4X4 12PLY STER LF (GAUZE/BANDAGES/DRESSINGS) ×3 IMPLANT
SUT PROLENE 3 0 SH DA (SUTURE) IMPLANT
SUT SILK  1 MH (SUTURE) ×1
SUT SILK 1 MH (SUTURE) ×2 IMPLANT
SUT SILK 1 TIES 10X30 (SUTURE) IMPLANT
SUT SILK 2 0 SH (SUTURE) ×6 IMPLANT
SUT SILK 2 0SH CR/8 30 (SUTURE) IMPLANT
SUT SILK 3 0SH CR/8 30 (SUTURE) IMPLANT
SUT VIC AB 1 CTX 36 (SUTURE) ×1
SUT VIC AB 1 CTX36XBRD ANBCTR (SUTURE) ×2 IMPLANT
SUT VIC AB 2-0 CT1 27 (SUTURE) ×2
SUT VIC AB 2-0 CT1 TAPERPNT 27 (SUTURE) ×4 IMPLANT
SUT VIC AB 2-0 CTX 36 (SUTURE) ×3 IMPLANT
SUT VIC AB 2-0 UR6 27 (SUTURE) IMPLANT
SUT VIC AB 3-0 MH 27 (SUTURE) IMPLANT
SUT VIC AB 3-0 SH 27 (SUTURE) ×1
SUT VIC AB 3-0 SH 27X BRD (SUTURE) ×2 IMPLANT
SUT VIC AB 3-0 X1 27 (SUTURE) ×6 IMPLANT
SUT VICRYL 2 TP 1 (SUTURE) IMPLANT
SWAB COLLECTION DEVICE MRSA (MISCELLANEOUS) ×3 IMPLANT
SYSTEM SAHARA CHEST DRAIN ATS (WOUND CARE) ×3 IMPLANT
TAPE CLOTH SURG 4X10 WHT LF (GAUZE/BANDAGES/DRESSINGS) ×3 IMPLANT
TIP APPLICATOR SPRAY EXTEND 16 (VASCULAR PRODUCTS) IMPLANT
TOWEL OR 17X24 6PK STRL BLUE (TOWEL DISPOSABLE) ×3 IMPLANT
TOWEL OR 17X26 10 PK STRL BLUE (TOWEL DISPOSABLE) ×6 IMPLANT
TRAP SPECIMEN MUCOUS 40CC (MISCELLANEOUS) IMPLANT
TRAY FOLEY CATH 14FRSI W/METER (CATHETERS) ×3 IMPLANT
TUBE ANAEROBIC SPECIMEN COL (MISCELLANEOUS) ×3 IMPLANT
TUNNELER SHEATH ON-Q 11GX8 DSP (PAIN MANAGEMENT) IMPLANT
WATER STERILE IRR 1000ML POUR (IV SOLUTION) ×3 IMPLANT

## 2014-11-30 NOTE — Anesthesia Postprocedure Evaluation (Signed)
  Anesthesia Post-op Note  Patient: Suzanne Spears  Procedure(s) Performed: Procedure(s): THORACOTOMY MAJOR (Left) EPICARDIAL PACING LEAD PLACEMENT (N/A)  Patient Location: PACU  Anesthesia Type:General  Level of Consciousness: awake, alert , oriented and patient cooperative  Airway and Oxygen Therapy: Patient Spontanous Breathing and Patient connected to nasal cannula oxygen  Post-op Pain: mild  Post-op Assessment: Post-op Vital signs reviewed, Patient's Cardiovascular Status Stable, Respiratory Function Stable, Patent Airway, No signs of Nausea or vomiting and Pain level controlled              Post-op Vital Signs: Reviewed and stable  Last Vitals:  Filed Vitals:   11/30/14 1115  BP:   Pulse: 97  Temp:   Resp: 20    Complications: No apparent anesthesia complications

## 2014-11-30 NOTE — OR Nursing (Signed)
Talked to Dannial Monarch from [facility], pt ICD does not need to be interrogated in PACU.

## 2014-11-30 NOTE — Interval H&P Note (Signed)
History and Physical Interval Note:  11/30/2014 5:45 AM  Suzanne Spears  has presented today for surgery, with the diagnosis of CHF  The various methods of treatment have been discussed with the patient and family. After consideration of risks, benefits and other options for treatment, the patient has consented to  Procedure(s): THORACOTOMY MAJOR (Left) EPICARDIAL PACING LEAD PLACEMENT (N/A) as a surgical intervention .  The patient's history has been reviewed, patient examined, no change in status, stable for surgery.  I have reviewed the patient's chart and labs.  Questions were answered to the patient's satisfaction.     Gaye Pollack

## 2014-11-30 NOTE — Op Note (Signed)
CARDIOTHORACIC SURGERY OPERATIVE NOTE  11/30/2014 [name] [id number]  Surgeon:  Gaye Pollack, MD  First Assistant: none   Preoperative Diagnosis:  Non-ischemic cardiomyopathy with NYHA class III CHF symptoms, LBBB  Postoperative Diagnosis: same  Procedure:  1. Left anterolateral thoracotomy 2. Insertion of 2 left ventricular epicardial pacing leads 3. Revision of Biventricular pacing system 4. Removal of old coronary sinus lead  Anesthesia:  General Endotracheal   Clinical History/Surgical Indication:  The patient is a 54 year old woman with hypertension and non-ischemic dilated cardiomyopathy with class III symptoms of systolic heart failure who had a BiV ICD inserted by Dr. Lovena Le on 06/15/2014. She was noted to have a left SVC. She says that she felt much better after the procedure and was able to walk and resume more activity. Then a few months later she began to feel worse with exertional tiredness, fatigue and shortness of breath and this has persisted. A CXR showed displacement of the LV lead back into the left SVC.  She has non-ischemic cardiomyopathy with NYHA class III congestive heart failure symptoms, LBBB morphology and a dislodged LV pacing lead after BiV ICD insertion in April. She has initial significant improvement in her symptoms and functional status after that procedure but has returned to her previous state since the LV lead is not working. I agree that placement of an LV epicardial lead via a left mini-thoracotomy is the best treatment for her. She has a very good chance of improvement. I discussed the operative procedure with the patient and her husband including alternatives, benefits and risks; including but not limited to bleeding, blood transfusion, infection, myocardial infarction, malfunction of her ICD system and the possibility that her symptoms will not improve afterwards. [name] understands and agrees to proceed.    Preparation:  The patient was seen in the preoperative holding area and the correct patient, correct operation, correct operative side were confirmed with the patient after reviewing the medical record. The consent was signed by me. Preoperative antibiotics were given.  The patient was taken back to the operating room and positioned supine on the operating room table. After being placed under general endotracheal anesthesia by the anesthesia team using a double lumen tube a foley catheter was placed. A roll was place beneath the left back to tilt the left side up slightly. The chest was prepped with betadine soap and solution.  A surgical time-out was taken and the correct patient,operative side, and operative procedure were confirmed with the nursing and anesthesia staff.   Operative Procedure:  A short anterolateral thoracotomy incision was made below the pectoralis border. The subcutaneous tissue was divided using electrocautery. The serratus muscle was split along its fibers. The pleural space was entered through the 6th intercostal space.  The pericardium was opened. A site was chosen for pacing lead insertion on the mid-lateral wall. A [employer] pacing lead was screwed into the myocardium ( model 5071-53 cm, SN [id number]). It was tested and the R wave was 11 mv. Impedence was 1200 ohms. Threshold was 0.8 @ 0.45ms. Another lead of the same type was screwed into the myocardium about 2 cm away from the first ( model 5071-53 cm, SN [id number]). Testing showed an R wave of 13 mv, Impedence of 1500 ohms, and a threshold of 0.5 @ 0.5 ms. The generator pocket was opened through the prior incision. There was a small amout of milky fluid present in the pocket. An aerobic and anaerobic culture was taken. I don't  suspect infection because the pocket looked fine and she had no signs or symptoms of infection. The generator was removed. The old coronary sinus lead was disconnected and removed from the patient  without difficulty. The two LV epicardial leads were brought out through the interspace and tunneled along the subcutaneous tissue of the chest wall up to the generator pocket. The lead with SN [id number] was connected to the generator and the other lead was capped. Through the device the the LV lead had an impedence of 437 ohms and a threshold of 0.5 @ 0.36ms. The pocket was enlarged slightly inferiorly. The generator was replaced in the pocket and the excess lead coiled behind it. The pocket was irrigated with saline. The pocket was closed with continuous 2-0 vicryl suture. The subcutaneous tissue was closed with 3-0 vicryl continuous suture. The skin was closed with 4-0 vicryl subcuticular suture. A 28 F chest tube was brought through a separate stab incision and positioned posteriorly in the left pleural space. The thoracotomy incision was closed using continuous #0 vicryl suture for the serratus muscle, 2-0 vicryl subcutaneous suture and 3-0 vicryl subcuticular suture. Dermabond was applied.  All sponge, needle, and instrument counts were reported correct at the end of the case. Dry sterile dressings were placed over the incisions and around the chest tubes which were connected to pleurevac suction. The patient was extubated and transported to the PACU in satisfactory and stable condition.

## 2014-11-30 NOTE — Progress Notes (Signed)
This nurse called charge CRNA and informed him that lab unable to obtain ABG after attempting 3 times during preop appt. Per CRNA, will obtain sample during arterial line insertion.
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
## 2014-11-30 NOTE — Anesthesia Procedure Notes (Signed)
Procedure Name: Intubation Date/Time: 11/30/2014 7:53 AM Performed by: Rebekah Chesterfield L Pre-anesthesia Checklist: Patient identified, Emergency Drugs available, Suction available, Patient being monitored and Timeout performed Patient Re-evaluated:Patient Re-evaluated prior to inductionOxygen Delivery Method: Circle system utilized Preoxygenation: Pre-oxygenation with 100% oxygen Intubation Type: IV induction and Cricoid Pressure applied Ventilation: Mask ventilation without difficulty Grade View: Grade I Endobronchial tube: Double lumen EBT, Left, EBT position confirmed by auscultation and EBT position confirmed by fiberoptic bronchoscope and 37 Fr Number of attempts: 1 Airway Equipment and Method: Stylet and Fiberoptic brochoscope Placement Confirmation: ETT inserted through vocal cords under direct vision,  positive ETCO2 and breath sounds checked- equal and bilateral Secured at: 29 cm Tube secured with: Tape Dental Injury: Teeth and Oropharynx as per pre-operative assessment

## 2014-11-30 NOTE — Brief Op Note (Signed)
11/30/2014  10:10 AM  PATIENT:  Suzanne Spears  54 y.o. female  PRE-OPERATIVE DIAGNOSIS:  CHF  POST-OPERATIVE DIAGNOSIS:  CHF  PROCEDURE:  Procedure(s): THORACOTOMY MAJOR (Left) EPICARDIAL PACING LEAD PLACEMENT (N/A)  SURGEON:  Surgeon(s) and Role:    * Gaye Pollack, MD - Primary  PHYSICIAN ASSISTANT: none  ASSISTANTS: none   ANESTHESIA:   general  EBL:  Total I/O In: 1050 [I.V.:1050] Out: 200 [Urine:200]  BLOOD ADMINISTERED:none  DRAINS: 28 F Chest Tube(s) in the left pleural space   LOCAL MEDICATIONS USED:  NONE  SPECIMEN:  Source of Specimen:  culture of pacemaker pocket  DISPOSITION OF SPECIMEN:  micro  COUNTS:  YES  TOURNIQUET:  * No tourniquets in log *  DICTATION: .Note written in EPIC  PLAN OF CARE: Admit to inpatient   PATIENT DISPOSITION:  PACU - hemodynamically stable.   Delay start of Pharmacological VTE agent (>24hrs) due to surgical blood loss or risk of bleeding: yes

## 2014-11-30 NOTE — Progress Notes (Signed)
Called Dr Glennon Mac regarding any needs for patients AICD in the post op period. [employer] rep was present during the whole case and all AICD interrogation if needed was already addressed. Will not call rep at this time for any AICD assistance.

## 2014-11-30 NOTE — Transfer of Care (Signed)
Immediate Anesthesia Transfer of Care Note  Patient: Suzanne Spears  Procedure(s) Performed: Procedure(s): THORACOTOMY MAJOR (Left) EPICARDIAL PACING LEAD PLACEMENT (N/A)  Patient Location: PACU  Anesthesia Type:General  Level of Consciousness: awake, alert  and responds to stimulation  Airway & Oxygen Therapy: Patient Spontanous Breathing and Patient connected to face mask oxygen  Post-op Assessment: Report given to RN and Post -op Vital signs reviewed and stable  Post vital signs: Reviewed and stable  Last Vitals:  Filed Vitals:   11/30/14 1028  BP: 150/86  Pulse: 93  Temp: 36.5 C  Resp: 15    Complications: No apparent anesthesia complications

## 2014-12-01 ENCOUNTER — Encounter (HOSPITAL_COMMUNITY): Payer: Self-pay | Admitting: Surgery

## 2014-12-01 ENCOUNTER — Inpatient Hospital Stay (HOSPITAL_COMMUNITY): Payer: BLUE CROSS/BLUE SHIELD

## 2014-12-01 LAB — BASIC METABOLIC PANEL
Anion gap: 10 (ref 5–15)
BUN: 8 mg/dL (ref 6–20)
CO2: 20 mmol/L — ABNORMAL LOW (ref 22–32)
Calcium: 8.2 mg/dL — ABNORMAL LOW (ref 8.9–10.3)
Chloride: 103 mmol/L (ref 101–111)
Creatinine, Ser: 1.09 mg/dL — ABNORMAL HIGH (ref 0.44–1.00)
GFR calc Af Amer: >60 mL/min (ref >60)
GFR calc non Af Amer: 57 mL/min — ABNORMAL LOW (ref >60)
Glucose, Bld: 121 mg/dL — ABNORMAL HIGH (ref 65–99)
Potassium: 3.9 mmol/L (ref 3.5–5.1)
Sodium: 133 mmol/L — ABNORMAL LOW (ref 135–145)

## 2014-12-01 LAB — CBC
HCT: 38.5 % (ref 36.0–46.0)
Hemoglobin: 12.6 g/dL (ref 12.0–15.0)
MCH: 31 pg (ref 26.0–34.0)
MCHC: 32.7 g/dL (ref 30.0–36.0)
MCV: 94.8 fL (ref 78.0–100.0)
Platelets: 161 10*3/uL (ref 150–400)
RBC: 4.06 MIL/uL (ref 3.87–5.11)
RDW: 13.4 % (ref 11.5–15.5)
WBC: 10.7 10*3/uL — ABNORMAL HIGH (ref 4.0–10.5)

## 2014-12-01 LAB — GLUCOSE, CAPILLARY
Glucose-Capillary: 107 mg/dL — ABNORMAL HIGH (ref 65–99)
Glucose-Capillary: 113 mg/dL — ABNORMAL HIGH (ref 65–99)
Glucose-Capillary: 91 mg/dL (ref 65–99)

## 2014-12-01 MED ORDER — LOSARTAN POTASSIUM 25 MG PO TABS
25.0000 mg | ORAL_TABLET | Freq: Every day | ORAL | Status: DC
  Administered 2014-12-01 – 2014-12-05 (×5): 25 mg via ORAL
  Filled 2014-12-01 (×5): qty 1

## 2014-12-01 MED ORDER — TRAZODONE HCL 50 MG PO TABS: 25.0000 mg | ORAL_TABLET | Freq: Every evening | ORAL | Status: DC | PRN

## 2014-12-01 MED ORDER — SPIRONOLACTONE 25 MG PO TABS
25.0000 mg | ORAL_TABLET | Freq: Every day | ORAL | Status: DC
  Administered 2014-12-01 – 2014-12-05 (×5): 25 mg via ORAL
  Filled 2014-12-01 (×5): qty 1

## 2014-12-01 NOTE — Progress Notes (Signed)
Transfer report received from 2S at 1135 and pt arrived to the unit at 1145 via wheelchair with IV intact with PCA. Pt A&O x4; pt oriented to the unit and room; pt educated on fall prevention plan; call light within reach; pt incision dsg clean, dry and intact. Pt in voided x1; pt in bed comfortably with call light within reach. Will closely monitor. PDarden Palmer Sophiagrace [place] Rn

## 2014-12-01 NOTE — Progress Notes (Signed)
1 Day Post-Op Procedure(s) (LRB): THORACOTOMY MAJOR (Left) EPICARDIAL PACING LEAD PLACEMENT (N/A) Subjective:  Only complaint is that she did not sleep much  Objective: Vital signs in last 24 hours: Temp:  [97.7 F (36.5 C)-99.4 F (37.4 C)] 98.7 F (37.1 C) (10/07 0752) Pulse Rate:  [83-110] 102 (10/07 0700) Cardiac Rhythm:  [-] Sinus tachycardia (10/07 0345) Resp:  [12-25] 24 (10/07 0805) BP: (92-150)/(50-86) 126/69 mmHg (10/07 0700) SpO2:  [91 %-100 %] 95 % (10/07 0805) Arterial Line BP: (100-166)/(58-85) 151/73 mmHg (10/07 0700) FiO2 (%):  [21 %] 21 % (10/06 1621) Weight:  [77.7 kg (171 lb 4.8 oz)] 77.7 kg (171 lb 4.8 oz) (10/07 0600)  Hemodynamic parameters for last 24 hours:    Intake/Output from previous day: 10/06 0701 - 10/07 0700 In: 1900 [I.V.:1900] Out: 1960 [Urine:1885; Blood:20; Chest Tube:55] Intake/Output this shift: Total I/O In: -  Out: 130 [Chest Tube:130]  General appearance: alert and cooperative Heart: regular rate and rhythm, S1, S2 normal, no murmur, click, rub or gallop Lungs: clear to auscultation bilaterally no air leak from chest tube.  Lab Results:  Recent Labs  11/28/14 1132 11/30/14 0715 12/01/14 0405  WBC 6.1  --  10.7*  HGB 13.0 12.9 12.6  HCT 39.5 38.0 38.5  PLT 178  --  161   BMET:  Recent Labs  11/28/14 1132 11/30/14 0715 12/01/14 0405  NA 139 140 133*  K 4.1 3.9 3.9  CL 108  --  103  CO2 21*  --  20*  GLUCOSE 91  --  121*  BUN 17  --  8  CREATININE 1.21*  --  1.09*  CALCIUM 9.3  --  8.2*    PT/INR:  Recent Labs  11/28/14 1132  LABPROT 14.5  INR 1.11   ABG    Component Value Date/Time   PHART 7.381 11/30/2014 0715   HCO3 18.6* 11/30/2014 0715   TCO2 20 11/30/2014 0715   ACIDBASEDEF 6.0* 11/30/2014 0715   O2SAT 99.0 11/30/2014 0715   CBG (last 3)   Recent Labs  11/30/14 1929 11/30/14 2347 12/01/14 0343  GLUCAP 165* 91 113*   CXR: ok  Assessment/Plan: S/P Procedure(s) (LRB): THORACOTOMY  MAJOR (Left) EPICARDIAL PACING LEAD PLACEMENT (N/A)  She is doing well. [employer] rep has checked pacer and it is functioning normally.  Operative cultures pending  DC chest tube, foley, arterial line and central line  Resume home meds  Transfer to 2W and mobilize. CXR in the am.   LOS: 1 day    Gaye Pollack 12/01/2014

## 2014-12-01 NOTE — Discharge Summary (Signed)
Physician Discharge Summary  Patient ID: Suzanne Spears MRN: [id number] DOB/AGE: Dec 16, 1960 54 y.o.  Admit date: 11/30/2014 Discharge date: 12/01/2014  Admission Diagnoses: Admitted for LV pacer lead via thoracotmy  Discharge Diagnoses:  Active Problems:   Status post thoracotomy  Patient Active Problem List   Diagnosis Date Noted  . Acute pulmonary embolism ([place]) 12/03/2014  . Status post thoracotomy 11/30/2014  . Cough 11/24/2014  . Breast pain, right 11/24/2014  . Cat bite 10/20/2014  . S/P ICD (internal cardiac defibrillator) procedure 06/15/2014  . Hypopigmented skin lesion 04/29/2014  . Chronic systolic heart failure ([place]) 04/27/2014  . CAP (community acquired pneumonia) 03/29/2014  . Depression 03/29/2014  . Phlebitis after infusion 03/29/2014  . Insomnia 03/29/2014  . Amaurosis fugax 12/28/2013  . Vision, loss, sudden 12/27/2013  . Decreased glomerular filtration rate (GFR) 12/04/2013  . Unspecified hereditary and idiopathic peripheral neuropathy 06/14/2013  . Edema 06/14/2013  . Statin intolerance 05/12/2013  . Routine general medical examination at a health care facility 05/05/2012  . Dyspareunia, female 05/05/2012  . Dizziness and giddiness 05/05/2012  . B12 deficiency 04/20/2012  . Cardiomyopathy, dilated, nonischemic ([place])   . Fatigue 03/11/2011  . Asthma, chronic   . Coronary artery disease   . Left bundle branch block   . Mitral valve prolapse syndrome    HPI:  The patient is a 54 year old woman with hypertension and non-ischemic dilated cardiomyopathy with class III symptoms of systolic heart failure who had a BiV ICD inserted by Dr. Lovena Le on 06/15/2014. She was noted to have a left SVC. She says that she felt much better after the procedure and was able to walk and resume more activity. Then a few months later she began to feel worse with exertional tiredness, fatigue and shortness of breath and this has persisted. A CXR showed displacement of the LV  lead back into the left SVC. She was evaluated and admitted for  LV pacer implant via thoracotomy by Dr Gilford Raid    Discharged Condition: good  Hospital Course: The patient was admitted electively and taken to the operating room for the described procedure. She tolerated well was taken to the postanesthesia care unit in stable condition and then transferred to the surgical intensive care unit for further observation and management. The [employer] rep has checked the pacer and it is functioning normally. She has remained stable hemodynamically. The chest tube, Foley catheter and arterial/central lines have been discontinued in the usual manner. She has been restarted on home medications. She did develop an episode of chest discomfort as well as associated lower extremity pain primarily in calf region. She was evaluated and found to have a pulmonary embolism by CT angiogram. She has subsequently been started on Coumadin and was on heparin until therapeutic. Clinically she is doing well and most recent INR is 3.63. She is tolerating routine activities using standard postoperative protocols. Incision is noted to be healing well without evidence of bleeding or infection. Currently her status is felt to be  stable for discharge  Consults: None  Significant Diagnostic Studies: routine post op CXR/lab, CTA, Venous duplex(negative)    Discharge Exam: Blood pressure 108/62, pulse 98, temperature 98.7 F (37.1 C), temperature source Oral, resp. rate 17, weight 171 lb 4.8 oz (77.7 kg), last menstrual period 11/04/2014, SpO2 95 %.  General appearance: alert, cooperative and no distress Heart: regular rate and rhythm and soft systolic murmur Lungs: dim in bases incis healing well  Disposition: 01-Home or Self Care  MEDICATIONS AT TIME OF DISCHARGE:   Medication List    STOP taking these medications        doxycycline 100 MG capsule  Commonly known as:  VIBRAMYCIN     lidocaine 5 % ointment   Commonly known as:  XYLOCAINE      TAKE these medications        albuterol 108 (90 BASE) MCG/ACT inhaler  Commonly known as:  PROVENTIL HFA;VENTOLIN HFA  Inhale 2 puffs into the lungs daily as needed for wheezing or shortness of breath.     cyanocobalamin 1000 MCG/ML injection  Commonly known as:  (VITAMIN B-12)  Inject 1 mL (1,000 mcg total) into the muscle once a week.     cyclobenzaprine 10 MG tablet  Commonly known as:  FLEXERIL  Take 10 mg by mouth 3 (three) times daily as needed for muscle spasms.     DULoxetine 60 MG capsule  Commonly known as:  CYMBALTA  TAKE (1) CAPSULE BY MOUTH EVERY DAY     fexofenadine 180 MG tablet  Commonly known as:  ALLEGRA  Take 180 mg by mouth at bedtime.     fluticasone 50 MCG/ACT nasal spray  Commonly known as:  FLONASE  Place 2 sprays into the nose at bedtime.     GILDESS FE 1/20 1-20 MG-MCG tablet  Generic drug:  norethindrone-ethinyl estradiol  TAKE (1) TABLET BY MOUTH EVERY DAY     losartan 25 MG tablet  Commonly known as:  COZAAR  Take 1 tablet (25 mg total) by mouth daily.     magnesium 30 MG tablet  Take 30 mg by mouth daily.     Melatonin 3 MG Tabs  Take 3 mg by mouth at bedtime as needed.     montelukast 10 MG tablet  Commonly known as:  SINGULAIR  Take 1 tablet (10 mg total) by mouth at bedtime.     NON FORMULARY  Fiber Gummy Takes 1 gummy daily.     oxyCODONE 5 MG immediate release tablet  Commonly known as:  Oxy IR/ROXICODONE  Take 1-2 tablets (5-10 mg total) by mouth every 6 (six) hours as needed for severe pain.     spironolactone 25 MG tablet  Commonly known as:  ALDACTONE  Take 1 tablet (25 mg total) by mouth daily.     topiramate 50 MG tablet  Commonly known as:  TOPAMAX  Take 1 tablet (50 mg total) by mouth at bedtime.     traZODone 50 MG tablet  Commonly known as:  DESYREL  TAKE 1/2-1 TABLET BY MOUTH AT BEDTIME ASNEEDED FOR SLEEP     valACYclovir 1000 MG tablet  Commonly known as:  VALTREX   Take 1 tablet (1,000 mg total) by mouth 2 (two) times daily.     warfarin 2.5 MG tablet  Commonly known as:  COUMADIN  Take 1 tablet (2.5 mg total) by mouth daily. Starting on 12/06/2014 then as directed by coumadin clinic        Procedures:   [facility] NOTE  11/30/2014 [name] [id number]  Surgeon: Gaye Pollack, MD  First Assistant: none   Preoperative Diagnosis: Non-ischemic cardiomyopathy with NYHA class III CHF symptoms, LBBB  Postoperative Diagnosis: same  Procedure:  1. Left anterolateral thoracotomy 2. Insertion of 2 left ventricular epicardial pacing leads 3. Revision of Biventricular pacing system 4. Removal of old coronary sinus lead  Anesthesia: General Endotracheal     FOLLOW UP Follow-up Information    Follow up  with Gaye Pollack, MD.   Specialty:  Cardiothoracic Surgery   Why:  You have an appointment on 12/08/2014 at 10 AM for suture removal by the nurse. Appointment with Arvid Right M.D. 12/20/2014 at 4:30 PM. Please obtain a chest x-ray [facility] at 4 PM which is located in the same office complex.   Contact information:   [address] [place] [address] [phone]       Follow up with [facility].   Specialty:  Cardiology   Why:  The office should contact you about an appointment this Thursday for blood test to adjust Coumadin dose. Please contact them if they have not called U to arrange.   Contact information:   [address] [place] [phone]     Signed: John Giovanni 12/01/2014, 10:23 AM

## 2014-12-02 ENCOUNTER — Inpatient Hospital Stay (HOSPITAL_COMMUNITY): Payer: BLUE CROSS/BLUE SHIELD

## 2014-12-02 ENCOUNTER — Encounter (HOSPITAL_COMMUNITY): Payer: Self-pay | Admitting: Student

## 2014-12-02 DIAGNOSIS — Z9889 Other specified postprocedural states: Secondary | ICD-10-CM

## 2014-12-02 DIAGNOSIS — R0781 Pleurodynia: Secondary | ICD-10-CM

## 2014-12-02 LAB — TROPONIN I: Troponin I: 0.29 ng/mL — ABNORMAL HIGH (ref <0.031)

## 2014-12-02 MED ORDER — NITROGLYCERIN 0.4 MG SL SUBL
SUBLINGUAL_TABLET | SUBLINGUAL | Status: AC
  Administered 2014-12-02 (×3): 0.4 mg
  Filled 2014-12-02: qty 1

## 2014-12-02 MED ORDER — NITROGLYCERIN 0.4 MG SL SUBL
SUBLINGUAL_TABLET | SUBLINGUAL | Status: AC
  Filled 2014-12-02: qty 1

## 2014-12-02 MED ORDER — WARFARIN - PHARMACIST DOSING INPATIENT: Freq: Every day | Status: DC

## 2014-12-02 MED ORDER — HEPARIN (PORCINE) IN NACL 100-0.45 UNIT/ML-% IJ SOLN
1500.0000 [IU]/h | INTRAMUSCULAR | Status: DC
  Administered 2014-12-02: 1200 [IU]/h via INTRAVENOUS
  Administered 2014-12-03: 1350 [IU]/h via INTRAVENOUS
  Administered 2014-12-04 – 2014-12-05 (×2): 1500 [IU]/h via INTRAVENOUS
  Filled 2014-12-02 (×4): qty 250

## 2014-12-02 MED ORDER — NITROGLYCERIN 0.4 MG SL SUBL
SUBLINGUAL_TABLET | SUBLINGUAL | Status: AC
  Administered 2014-12-02 (×2): 0.4 mg via ORAL
  Filled 2014-12-02: qty 2

## 2014-12-02 MED ORDER — IOHEXOL 350 MG/ML SOLN
75.0000 mL | Freq: Once | INTRAVENOUS | Status: AC | PRN
  Administered 2014-12-02: 75 mL via INTRAVENOUS

## 2014-12-02 MED ORDER — WARFARIN SODIUM 7.5 MG PO TABS
7.5000 mg | ORAL_TABLET | Freq: Once | ORAL | Status: AC
  Administered 2014-12-02: 7.5 mg via ORAL
  Filled 2014-12-02: qty 1
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
## 2014-12-02 NOTE — Progress Notes (Signed)
Patient ID: Suzanne Spears, female   DOB: [birth year], 54 y.o.   MRN: [id number]  CT surgery  I have reviewed the CTA pulmonary angio and it shows an occlusive thrombus in a subsegmental RUL pulmonary artery branch. I will start her on heparin per pharmacy protocol with no bolus since she has recently had surgery and is at increased risk for bleeding. Lower extremity venous dopplers ordered but not done yet.

## 2014-12-02 NOTE — Progress Notes (Addendum)
[address]       [place] [address]             [phone]      2 Days Post-Op Procedure(s) (LRB): THORACOTOMY MAJOR (Left) EPICARDIAL PACING LEAD PLACEMENT (N/A) Subjective: Having chest pain and calf pain this am. + some relief with oxycodone and sl nitro  Objective: Vital signs in last 24 hours: Temp:  [97.8 F (36.6 C)-98.6 F (37 C)] 98.4 F (36.9 C) (10/08 0730) Pulse Rate:  [92-119] 119 (10/08 0747) Cardiac Rhythm:  [-] Ventricular paced (10/07 1900) Resp:  [15-30] 20 (10/08 0747) BP: (95-160)/(57-89) 160/89 mmHg (10/08 0747) SpO2:  [90 %-100 %] 100 % (10/08 0747) Arterial Line BP: (118-143)/(64-74) 124/68 mmHg (10/07 0945)  Hemodynamic parameters for last 24 hours:    Intake/Output from previous day: 10/07 0701 - 10/08 0700 In: 10.5 [I.V.:10.5] Out: 250 [Urine:90; Chest Tube:160] Intake/Output this shift:    General appearance: alert, cooperative and no distress Heart: regular rate and rhythm and tachy Lungs: clear to auscultation bilaterally Abdomen: benign Extremities: no edema, + calf tenderness Wound: incis ok  Lab Results:  Recent Labs  11/30/14 0715 12/01/14 0405  WBC  --  10.7*  HGB 12.9 12.6  HCT 38.0 38.5  PLT  --  161   BMET:  Recent Labs  11/30/14 0715 12/01/14 0405  NA 140 133*  K 3.9 3.9  CL  --  103  CO2  --  20*  GLUCOSE  --  121*  BUN  --  8  CREATININE  --  1.09*  CALCIUM  --  8.2*    PT/INR: No results for input(s): LABPROT, INR in the last 72 hours. ABG    Component Value Date/Time   PHART 7.381 11/30/2014 0715   HCO3 18.6* 11/30/2014 0715   TCO2 20 11/30/2014 0715   ACIDBASEDEF 6.0* 11/30/2014 0715   O2SAT 99.0 11/30/2014 0715   CBG (last 3)   Recent Labs  11/30/14 2347 12/01/14 0343 12/01/14 0750  GLUCAP 91 113* 107*    Meds Scheduled Meds: . bisacodyl  10 mg Oral Daily  . DULoxetine  60 mg Oral Daily  . enoxaparin (LOVENOX) injection  40 mg Subcutaneous Daily  .  fluticasone  2 spray Each Nare Daily  . loratadine  10 mg Oral Daily  . losartan  25 mg Oral Daily  . montelukast  10 mg Oral QHS  . nitroGLYCERIN      . senna-docusate  1 tablet Oral QHS  . spironolactone  25 mg Oral Daily   Continuous Infusions:  PRN Meds:.albuterol, oxyCODONE, traMADol, traZODone  Xrays Dg Chest Port 1 View  12/01/2014   CLINICAL DATA:  Status post thoracic surgery.  Subsequent encounter.  EXAM: PORTABLE CHEST 1 VIEW  COMPARISON:  11/30/2014  FINDINGS: Increased opacity is noted at the left lung base now obscuring hemidiaphragm likely combination of atelectasis and pleural fluid. Mild medial right lung base atelectasis is stable. No pulmonary edema. No pneumothorax is evident on this semi-erect study.  Cardiac silhouette is normal in size.  No mediastinal widening.  Left chest tube, right internal jugular central venous line and left-sided cardioverter-defibrillator are stable and well positioned.  IMPRESSION: 1. Increased opacity at the left lung base when compared to the previous day's study likely combination of pleural fluid and atelectasis. 2. No other change.  No pulmonary edema or pneumothorax. 3. Support apparatus is stable and well positioned.   Electronically Signed  By: Lajean Manes M.D.   On: 12/01/2014 07:52   Dg Chest Port 1 View  11/30/2014   CLINICAL DATA:  Postoperative thoracotomy ; cardiac arrhythmia  EXAM: PORTABLE CHEST 1 VIEW  COMPARISON:  November 28, 2014  FINDINGS: There is a chest tube on the left. Central catheter tip is in the superior vena cava near the cavoatrial junction. Pacemaker leads are attached to the right atrium and right ventricle. There is no demonstrable pneumothorax.  There is no edema or consolidation. The heart size and pulmonary vascularity are normal. No adenopathy. There is postoperative change in the lower cervical spine.  IMPRESSION: Tube and catheter positions as described without pneumothorax. No edema or consolidation. Cardiac  silhouette within normal limits.   Electronically Signed   By: Lowella Grip III M.D.   On: 11/30/2014 11:01    Assessment/Plan: S/P Procedure(s) (LRB): THORACOTOMY MAJOR (Left) EPICARDIAL PACING LEAD PLACEMENT (N/A)  1 chest pain- probable surgical related although was anterior with radiation. H/O non- ischemic cardiomyopathy so doubt angina- EKG shows pacing so difficult to read any ischemia. Will check troponin 2 calf pain- will check venous duplex for completeness 3 have asked cardiology to see    LOS: 2 days    GOLD,WAYNE E 12/02/2014   Chart reviewed, patient examined, agree with above. She reports some right calf pain since yesterday that seems worse today and has had new left chest pain worse with breathing and pain around left scapula in the back. No shortness of breath and oxygen sats ok on 2L. She was given SL NTG and pain meds by nurses but she has a non-ischemic cardiomyopathy so NTG not needed. Troponin was positive but she had leads screwed into the myocardium. I think venous dopplers to rule out DVT and CTA chest to rule out PE is prudent. Her CXR shows atelectasis and a small effusion at the left base. She could also have some pericarditis.
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
## 2014-12-02 NOTE — Progress Notes (Signed)
Pt Fentanyl PCA stopped at 1922 on 12/01/14. Pt reported not using Fentanyl PCA anymore at that time.  Unable to find where to waste in [place] at 0440.  Pharmacy contacted and advised to document remainder in progress note.  Remainder to be wasted is 30 mL.  Syringe emptied into sink and syringe placed in black box in locked medication room. Witnessed by Grafton Folk, RN  Izola Price, RN

## 2014-12-02 NOTE — Progress Notes (Signed)
Dr. Jetta Lout from Radiology called RN to inform her of pt CT-Angio chest results; Dr. Cyndia Bent called and informed of results. MD said he will take a look at the results. No new orders received yet. Will closely monitor pt. Francis Gaines Gianno Volner RN.

## 2014-12-02 NOTE — Progress Notes (Signed)
0930 pt c/o chest pain mostly associated with breathing or deep breathing. Nitrostat tablet adm x3 effective; VSS; PA Wayne notified no new orders received. Will closely monitor pt. Francis Gaines Williamson Cavanah RN.

## 2014-12-02 NOTE — Progress Notes (Signed)
Pt reports midsternal chest pain 8/10, pt also reports bilateral leg pain 6/10, 2 liters of oxygen given via nasal cannula, EKG completed, pt given 10 mg oxycodone, pt also given 2 tabs of sublingual nitroglycerin, pt feels relief from nitroglycerin 5/10 pain, next RN given report and updated, next RN called MD, PA Patrick Jupiter will come to see patient, pain now at 4/10, RN will continue to monitor, pt call bell in reach and bed in lowest position.  12/02/2014  Izola Price, RN

## 2014-12-03 ENCOUNTER — Inpatient Hospital Stay (HOSPITAL_COMMUNITY): Payer: BLUE CROSS/BLUE SHIELD

## 2014-12-03 DIAGNOSIS — I447 Left bundle-branch block, unspecified: Secondary | ICD-10-CM

## 2014-12-03 DIAGNOSIS — I2699 Other pulmonary embolism without acute cor pulmonale: Secondary | ICD-10-CM

## 2014-12-03 DIAGNOSIS — Z9889 Other specified postprocedural states: Secondary | ICD-10-CM

## 2014-12-03 DIAGNOSIS — I5022 Chronic systolic (congestive) heart failure: Secondary | ICD-10-CM

## 2014-12-03 LAB — CBC
HCT: 37.8 % (ref 36.0–46.0)
Hemoglobin: 12.2 g/dL (ref 12.0–15.0)
MCH: 30.5 pg (ref 26.0–34.0)
MCHC: 32.3 g/dL (ref 30.0–36.0)
MCV: 94.5 fL (ref 78.0–100.0)
Platelets: 169 10*3/uL (ref 150–400)
RBC: 4 MIL/uL (ref 3.87–5.11)
RDW: 13.5 % (ref 11.5–15.5)
WBC: 10.3 10*3/uL (ref 4.0–10.5)

## 2014-12-03 LAB — PROTIME-INR
INR: 1.21 (ref 0.00–1.49)
Prothrombin Time: 15.5 seconds — ABNORMAL HIGH (ref 11.6–15.2)

## 2014-12-03 LAB — HEPARIN LEVEL (UNFRACTIONATED)
Heparin Unfractionated: 0.45 IU/mL (ref 0.30–0.70)
Heparin Unfractionated: 0.26 IU/mL — ABNORMAL LOW (ref 0.30–0.70)
Heparin Unfractionated: 0.27 IU/mL — ABNORMAL LOW (ref 0.30–0.70)

## 2014-12-03 MED ORDER — WARFARIN SODIUM 7.5 MG PO TABS
7.5000 mg | ORAL_TABLET | Freq: Once | ORAL | Status: AC
  Administered 2014-12-03: 7.5 mg via ORAL
  Filled 2014-12-03: qty 1
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
## 2014-12-03 NOTE — Progress Notes (Signed)
VASCULAR LAB PRELIMINARY  PRELIMINARY  PRELIMINARY  PRELIMINARY  Bilateral lower extremity venous duplex completed.    Preliminary report:  There is no DVT or SVT noted in the bilateral lower extremities.   Joyice Magda, RVT 12/03/2014, 9:45 AM

## 2014-12-03 NOTE — Progress Notes (Signed)
ANTICOAGULATION CONSULT NOTE - Follow Up Consult  Pharmacy Consult for heparin Indication: pulmonary embolus   Labs:  Recent Labs  11/30/14 0715 12/01/14 0405 12/02/14 0845 12/03/14 0100  HGB 12.9 12.6  --  12.2  HCT 38.0 38.5  --  37.8  PLT  --  161  --  169  LABPROT  --   --   --  15.5*  INR  --   --   --  1.21  HEPARINUNFRC  --   --   --  0.45  CREATININE  --  1.09*  --   --   TROPONINI  --   --  0.29*  --     Assessment/Plan:  54yo female therapeutic on heparin with initial dosing for PE. Will continue gtt at current rate and confirm stable with additional level.   Wynona Neat, PharmD, BCPS  12/03/2014,1:53 AM

## 2014-12-03 NOTE — Progress Notes (Addendum)
[address]       ,[place] [id number]             [phone]      3 Days Post-Op Procedure(s) (LRB): THORACOTOMY MAJOR (Left) EPICARDIAL PACING LEAD PLACEMENT (N/A) Subjective: Feeling better today. Not SOB, no current chest pain, R>L calf pain  Objective: Vital signs in last 24 hours: Temp:  [98.1 F (36.7 C)-99.1 F (37.3 C)] 98.1 F (36.7 C) (10/09 0412) Pulse Rate:  [108-136] 108 (10/09 0412) Cardiac Rhythm:  [-] Sinus tachycardia (10/08 1900) Resp:  [18-20] 18 (10/09 0412) BP: (98-149)/(71-85) 98/71 mmHg (10/09 0412) SpO2:  [94 %-100 %] 97 % (10/09 0412)  Hemodynamic parameters for last 24 hours:    Intake/Output from previous day: 10/08 0701 - 10/09 0700 In: 480 [P.O.:480] Out: -  Intake/Output this shift:    General appearance: alert, cooperative and no distress Heart: regular rate and rhythm and tachy Lungs: clear to auscultation bilaterally Abdomen: benign Extremities: + calf TTP, no edema Wound: incis ok  Lab Results:  Recent Labs  12/01/14 0405 12/03/14 0100  WBC 10.7* 10.3  HGB 12.6 12.2  HCT 38.5 37.8  PLT 161 169   BMET:  Recent Labs  12/01/14 0405  NA 133*  K 3.9  CL 103  CO2 20*  GLUCOSE 121*  BUN 8  CREATININE 1.09*  CALCIUM 8.2*    PT/INR:  Recent Labs  12/03/14 0100  LABPROT 15.5*  INR 1.21   ABG    Component Value Date/Time   PHART 7.381 11/30/2014 0715   HCO3 18.6* 11/30/2014 0715   TCO2 20 11/30/2014 0715   ACIDBASEDEF 6.0* 11/30/2014 0715   O2SAT 99.0 11/30/2014 0715   CBG (last 3)   Recent Labs  11/30/14 2347 12/01/14 0343 12/01/14 0750  GLUCAP 91 113* 107*    Meds Scheduled Meds: . bisacodyl  10 mg Oral Daily  . DULoxetine  60 mg Oral Daily  . fluticasone  2 spray Each Nare Daily  . loratadine  10 mg Oral Daily  . losartan  25 mg Oral Daily  . montelukast  10 mg Oral QHS  . senna-docusate  1 tablet Oral QHS  . spironolactone  25 mg Oral Daily  . Warfarin - Pharmacist  Dosing Inpatient   Does not apply q1800   Continuous Infusions: . heparin 1,200 Units/hr (12/02/14 1728)   PRN Meds:.albuterol, oxyCODONE, traMADol, traZODone  Xrays Dg Chest 2 View  12/02/2014   CLINICAL DATA:  Status post thoracotomy  EXAM: CHEST  2 VIEW  COMPARISON:  Chest radiograph from one day prior.  FINDINGS: Surgical hardware overlies the lower cervical spine. Stable configuration of do lead left subclavian ICD with additional epicardial leads. Stable cardiomediastinal silhouette with mild cardiomegaly. Interval removal of the left chest tube. No pneumothorax. No right pleural effusion. Stable small left pleural effusion. No pulmonary edema. Mild patchy bibasilar lung opacities, not appreciably changed.  IMPRESSION: 1. No pneumothorax status post left chest tube removal. 2. Stable mild cardiomegaly without pulmonary edema. 3. Stable mild patchy bibasilar lung opacities, favor atelectasis. 4. Stable small left pleural effusion.   Electronically Signed   By: Suzanne Spears M.D.   On: 12/02/2014 10:10   Ct Angio Chest Pe W/cm &/or Wo Cm  12/02/2014   CLINICAL DATA:  Chest pain associated with deep breathing. Shortness of breath.  EXAM: CT ANGIOGRAPHY CHEST WITH CONTRAST  TECHNIQUE: Multidetector CT imaging of the chest was performed using the standard  protocol during bolus administration of intravenous contrast. Multiplanar CT image reconstructions and MIPs were obtained to evaluate the vascular anatomy.  CONTRAST:  69mL OMNIPAQUE IOHEXOL 350 MG/ML SOLN  COMPARISON:  Chest x-ray dated 12/02/2014.  FINDINGS: There is a occlusive pulmonary embolus within a subsegmental right upper lobe pulmonary artery. No evidence of right heart strain.  There is a small pericardial effusion. Biventricular cardiac pacemaker is in place. There is left larger than right pleural effusion with associated subsegmental atelectasis. There is a tiny left apical pneumothorax.  No mediastinal or hilar lymphadenopathy. No  axillary lymphadenopathy is seen. The visualized portions of the thyroid gland are unremarkable in appearance.  The visualized portions of the liver and spleen are unremarkable.  The visualized portions of the pancreas, gallbladder, stomach, adrenal glands and kidneys are within normal limits.  No acute osseous abnormalities are seen. Lower cervical fusion is noted.  Review of the MIP images confirms the above findings.  IMPRESSION: Small occlusive pulmonary embolus within a subsegmental right upper lobe pulmonary artery.  Left larger than right pleural effusion, with associated bibasilar atelectasis.  Tiny residual left apical pneumothorax.  Small pericardial effusion.  These results were called by telephone at the time of interpretation on 12/02/2014 at 4:39 pm to [facility], RN, who answered on behalf of Dr. Bernerd Pho , and verbally acknowledged these results.   Electronically Signed   By: Suzanne Spears M.D.   On: 12/02/2014 16:44    Assessment/Plan: S/P Procedure(s) (LRB): THORACOTOMY MAJOR (Left) EPICARDIAL PACING LEAD PLACEMENT (N/A)  1 stable now on heparin/coumadin 2 creat actually a little better than pre dye for CTA at 1.09 3 LE dopplers still not done   LOS: 3 days    Spears,Suzanne E 12/03/2014   Chart reviewed, patient examined, agree with above. LE dopplers negative for DVT Continue heparin and coumadin for PE
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
## 2014-12-04 DIAGNOSIS — I429 Cardiomyopathy, unspecified: Secondary | ICD-10-CM

## 2014-12-04 LAB — CBC
HCT: 35.8 % — ABNORMAL LOW (ref 36.0–46.0)
Hemoglobin: 11.6 g/dL — ABNORMAL LOW (ref 12.0–15.0)
MCH: 30.4 pg (ref 26.0–34.0)
MCHC: 32.4 g/dL (ref 30.0–36.0)
MCV: 94 fL (ref 78.0–100.0)
Platelets: 200 10*3/uL (ref 150–400)
RBC: 3.81 MIL/uL — ABNORMAL LOW (ref 3.87–5.11)
RDW: 13.6 % (ref 11.5–15.5)
WBC: 7.2 10*3/uL (ref 4.0–10.5)

## 2014-12-04 LAB — CULTURE, ROUTINE-ABSCESS: Culture: NO GROWTH

## 2014-12-04 LAB — PROTIME-INR
INR: 1.52 — ABNORMAL HIGH (ref 0.00–1.49)
Prothrombin Time: 18.4 seconds — ABNORMAL HIGH (ref 11.6–15.2)

## 2014-12-04 LAB — HEPARIN LEVEL (UNFRACTIONATED)
Heparin Unfractionated: 0.43 IU/mL (ref 0.30–0.70)
Heparin Unfractionated: 0.51 IU/mL (ref 0.30–0.70)

## 2014-12-04 MED ORDER — WARFARIN SODIUM 5 MG PO TABS
5.0000 mg | ORAL_TABLET | Freq: Once | ORAL | Status: AC
Start: 2014-12-04 — End: 2014-12-04
  Administered 2014-12-04: 5 mg via ORAL
  Filled 2014-12-04: qty 1

## 2014-12-04 NOTE — Progress Notes (Signed)
ANTICOAGULATION CONSULT NOTE - Follow Up Consult  Pharmacy Consult:  Heparin / Coumadin (Overlap D# 3/5) Indication:  New PE  Allergies  Allergen Reactions  . Adhesive [Tape] Rash    Pulls skin off  . Penicillin G Swelling    Other reaction(s): Localized superficial swelling of skin  . Coreg [Carvedilol] Swelling    Swelling, headache  . Metoprolol Other (See Comments)    Swelling and headache  . Latex Rash  . Levofloxacin Rash  . Penicillins Rash    Patient Measurements: Weight: 171 lb 4.8 oz (77.7 kg) Heparin Dosing Weight: 77 kg  Vital Signs: Temp: 97.9 F (36.6 C) (10/10 0421) Temp Source: Oral (10/10 0421) BP: 109/76 mmHg (10/10 0421) Pulse Rate: 85 (10/10 0421)  Labs:  Recent Labs  12/02/14 0845  12/03/14 0100 12/03/14 1040 12/03/14 1851 12/04/14 0412  HGB  --   --  12.2  --   --  11.6*  HCT  --   --  37.8  --   --  35.8*  PLT  --   --  169  --   --  200  LABPROT  --   --  15.5*  --   --  18.4*  INR  --   --  1.21  --   --  1.52*  HEPARINUNFRC  --   < > 0.45 0.26* 0.27* 0.43  TROPONINI 0.29*  --   --   --   --   --   < > = values in this interval not displayed.  Estimated Creatinine Clearance: 62.2 mL/min (by C-G formula based on Cr of 1.09).     Assessment: 67 YOF with new PE to continue on IV heparin and Coumadin (minimum overlap D# 3/5).  Heparin level remains therapeutic and INR is trending up.  No bleeding reported.   Goal of Therapy:  Heparin level 0.3-0.7 units/ml  INR 2 - 3 Monitor platelets by anticoagulation protocol: Yes    Plan:  - Continue heparin gtt at 1500 units/hr - Coumadin 5mg  PO today - Daily HL / CBC / PT / INR - F/U resume home med Topamax    Milbert Bixler D. Mina Marble, PharmD, BCPS Pager:  [phone] 12/04/2014, 11:13 AM

## 2014-12-04 NOTE — Progress Notes (Signed)
    SUBJECTIVE:  No chest pain.  Feels much better   PHYSICAL EXAM Filed Vitals:   12/03/14 1050 12/03/14 1306 12/03/14 1942 12/04/14 0421  BP: 105/69 116/77 105/70 109/76  Pulse: 105 106 108 85  Temp: 98.8 F (37.1 C) 98.6 F (37 C) 98.2 F (36.8 C) 97.9 F (36.6 C)  TempSrc: Oral Oral Oral Oral  Resp: 18 18 18 18   Weight:      SpO2: 98% 98% 97% 99%   General:  No distress Lungs:  Clear Heart:  RRR, no rub Abdomen:  Positive bowel sounds, no rebound no guarding Extremities:  No edema  LABS:  Results for orders placed or performed during the hospital encounter of 11/30/14 (from the past 24 hour(s))  Heparin level (unfractionated)     Status: Abnormal   Collection Time: 12/03/14 10:40 AM  Result Value Ref Range   Heparin Unfractionated 0.26 (L) 0.30 - 0.70 IU/mL  Heparin level (unfractionated)     Status: Abnormal   Collection Time: 12/03/14  6:51 PM  Result Value Ref Range   Heparin Unfractionated 0.27 (L) 0.30 - 0.70 IU/mL  Heparin level (unfractionated)     Status: None   Collection Time: 12/04/14  4:12 AM  Result Value Ref Range   Heparin Unfractionated 0.43 0.30 - 0.70 IU/mL  CBC     Status: Abnormal   Collection Time: 12/04/14  4:12 AM  Result Value Ref Range   WBC 7.2 4.0 - 10.5 K/uL   RBC 3.81 (L) 3.87 - 5.11 MIL/uL   Hemoglobin 11.6 (L) 12.0 - 15.0 g/dL   HCT 35.8 (L) 36.0 - 46.0 %   MCV 94.0 78.0 - 100.0 fL   MCH 30.4 26.0 - 34.0 pg   MCHC 32.4 30.0 - 36.0 g/dL   RDW 13.6 11.5 - 15.5 %   Platelets 200 150 - 400 K/uL  Protime-INR     Status: Abnormal   Collection Time: 12/04/14  4:12 AM  Result Value Ref Range   Prothrombin Time 18.4 (H) 11.6 - 15.2 seconds   INR 1.52 (H) 0.00 - 1.49    Intake/Output Summary (Last 24 hours) at 12/04/14 0939 Last data filed at 12/03/14 1900  Gross per 24 hour  Intake 620.53 ml  Output      0 ml  Net 620.53 ml    ASSESSMENT AND PLAN  THORACOTOMY FOR EPICARDIAL LEAD PLACEMENT:    Feeling better.  NO change in  therapy.    PULMONARY EMBOLISM:  On heparin and warfarin.   No evidence of DVT.   Heparin until INR greater than 2.  I do not think that she needs overlap.    DILATED CARDIOMYOPATHY:    She is on her ARB and spironolactone.  I will review her chart to see is she has tolerated a low dose beta blocker in the past.   Minus Breeding 12/04/2014 9:39 AM

## 2014-12-04 NOTE — Progress Notes (Signed)
Utilization review completed.  

## 2014-12-04 NOTE — Progress Notes (Addendum)
[address]       [place] [address]             [phone]      4 Days Post-Op Procedure(s) (LRB): THORACOTOMY MAJOR (Left) EPICARDIAL PACING LEAD PLACEMENT (N/A) Subjective: Feels well  Objective: Vital signs in last 24 hours: Temp:  [97.9 F (36.6 C)-98.8 F (37.1 C)] 97.9 F (36.6 C) (10/10 0421) Pulse Rate:  [85-108] 85 (10/10 0421) Cardiac Rhythm:  [-] Ventricular paced (10/10 0843) Resp:  [18] 18 (10/10 0421) BP: (105-116)/(69-77) 109/76 mmHg (10/10 0421) SpO2:  [97 %-99 %] 99 % (10/10 0421)  Hemodynamic parameters for last 24 hours:    Intake/Output from previous day: 10/09 0701 - 10/10 0700 In: 936.9 [P.O.:620; I.V.:316.9] Out: -  Intake/Output this shift:    General appearance: alert, cooperative and no distress Heart: regular rate and rhythm Lungs: clear to auscultation bilaterally Extremities: currently no calf tenderness Wound: incis healing well  Lab Results:  Recent Labs  12/03/14 0100 12/04/14 0412  WBC 10.3 7.2  HGB 12.2 11.6*  HCT 37.8 35.8*  PLT 169 200   BMET: No results for input(s): NA, K, CL, CO2, GLUCOSE, BUN, CREATININE, CALCIUM in the last 72 hours.  PT/INR:  Recent Labs  12/04/14 0412  LABPROT 18.4*  INR 1.52*   ABG    Component Value Date/Time   PHART 7.381 11/30/2014 0715   HCO3 18.6* 11/30/2014 0715   TCO2 20 11/30/2014 0715   ACIDBASEDEF 6.0* 11/30/2014 0715   O2SAT 99.0 11/30/2014 0715   CBG (last 3)  No results for input(s): GLUCAP in the last 72 hours.  Meds Scheduled Meds: . bisacodyl  10 mg Oral Daily  . DULoxetine  60 mg Oral Daily  . fluticasone  2 spray Each Nare Daily  . loratadine  10 mg Oral Daily  . losartan  25 mg Oral Daily  . montelukast  10 mg Oral QHS  . senna-docusate  1 tablet Oral QHS  . spironolactone  25 mg Oral Daily  . Warfarin - Pharmacist Dosing Inpatient   Does not apply q1800   Continuous Infusions: . heparin 1,500 Units/hr (12/04/14 0759)   PRN  Meds:.albuterol, oxyCODONE, traMADol, traZODone  Xrays Dg Chest 2 View  12/02/2014   CLINICAL DATA:  Status post thoracotomy  EXAM: CHEST  2 VIEW  COMPARISON:  Chest radiograph from one day prior.  FINDINGS: Surgical hardware overlies the lower cervical spine. Stable configuration of do lead left subclavian ICD with additional epicardial leads. Stable cardiomediastinal silhouette with mild cardiomegaly. Interval removal of the left chest tube. No pneumothorax. No right pleural effusion. Stable small left pleural effusion. No pulmonary edema. Mild patchy bibasilar lung opacities, not appreciably changed.  IMPRESSION: 1. No pneumothorax status post left chest tube removal. 2. Stable mild cardiomegaly without pulmonary edema. 3. Stable mild patchy bibasilar lung opacities, favor atelectasis. 4. Stable small left pleural effusion.   Electronically Signed   By: Ilona Sorrel M.D.   On: 12/02/2014 10:10   Ct Angio Chest Pe W/cm &/or Wo Cm  12/02/2014   CLINICAL DATA:  Chest pain associated with deep breathing. Shortness of breath.  EXAM: CT ANGIOGRAPHY CHEST WITH CONTRAST  TECHNIQUE: Multidetector CT imaging of the chest was performed using the standard protocol during bolus administration of intravenous contrast. Multiplanar CT image reconstructions and MIPs were obtained to evaluate the vascular anatomy.  CONTRAST:  49mL OMNIPAQUE IOHEXOL 350 MG/ML SOLN  COMPARISON:  Chest x-ray dated  12/02/2014.  FINDINGS: There is a occlusive pulmonary embolus within a subsegmental right upper lobe pulmonary artery. No evidence of right heart strain.  There is a small pericardial effusion. Biventricular cardiac pacemaker is in place. There is left larger than right pleural effusion with associated subsegmental atelectasis. There is a tiny left apical pneumothorax.  No mediastinal or hilar lymphadenopathy. No axillary lymphadenopathy is seen. The visualized portions of the thyroid gland are unremarkable in appearance.  The  visualized portions of the liver and spleen are unremarkable.  The visualized portions of the pancreas, gallbladder, stomach, adrenal glands and kidneys are within normal limits.  No acute osseous abnormalities are seen. Lower cervical fusion is noted.  Review of the MIP images confirms the above findings.  IMPRESSION: Small occlusive pulmonary embolus within a subsegmental right upper lobe pulmonary artery.  Left larger than right pleural effusion, with associated bibasilar atelectasis.  Tiny residual left apical pneumothorax.  Small pericardial effusion.  These results were called by telephone at the time of interpretation on 12/02/2014 at 4:39 pm to [facility], RN, who answered on behalf of Dr. Bernerd Pho , and verbally acknowledged these results.   Electronically Signed   By: Fidela Salisbury M.D.   On: 12/02/2014 16:44    Assessment/Plan: S/P Procedure(s) (LRB): THORACOTOMY MAJOR (Left) EPICARDIAL PACING LEAD PLACEMENT (N/A)  1 clinically very stable, cont coumadin with heparin bridge till therapeudic    LOS: 4 days    GOLD,WAYNE E 12/04/2014   Chart reviewed, patient examined, agree with above. She feels well. INR up to 1.52. As long as INR about 2.0 I think she can go home with outpt follow up. I agree with Dr. Percival Spanish that I don't think she needs a period of overlap.

## 2014-12-04 NOTE — Discharge Instructions (Addendum)
Pacemaker Implantation, Care After °Refer to this sheet over the next few weeks. These instructions provide you with information on caring for yourself after the procedure. Your health care provider may also give you more specific instructions. Your treatment has been planned according to current medical practices, but problems sometimes occur. Call your health care provider if you have any problems or questions regarding your pacemaker.  °WHAT TO EXPECT AFTER THE PROCEDURE °· You may feel pain. Some pain is normal. It may last a few days. °· A slight bump may be seen over the skin where the device was placed. Sometimes, it is possible to feel the device under the skin. This is normal. °· In the months and years afterward, your health care provider will check the device, the leads, and the battery every few months. Eventually, when the battery is low, the device will be replaced. °HOME CARE INSTRUCTIONS °Medicines °· Take medicines only as directed by your health care provider. °· If you were prescribed an antibiotic medicine, finish it all even if you start to feel better. °· Do not take any other medicines without asking your health care provider first. Some medicines, including certain painkillers, can cause bleeding in your stomach after surgery. °Wound Care °· Do not remove the bandage on your chest until directed to do so by your health care provider. °· After your bandage is removed, you may see pieces of tape called skin adhesive strips over the area where the cut was made (incision site). Let them fall off on their own. °· Check the incision site every day to make sure it is not infected, bleeding, or starting to pull apart. °· Do not use lotions or ointments near the incision site unless directed to do so. °· Keep the incision area clean and dry for 2-3 days after the procedure or as directed by your health care provider. It takes several weeks for the incision site to completely heal. °· Do not take  baths, swim, or use a hot tub until your health care provider approves. °Activities °· Try to walk a little every day. Exercising is important after this procedure. It is also important to use your shoulder on the side of the pacemaker in daily tasks that do not require exaggerated motion. °· Avoid sudden jerking, pulling, or chopping movements that pull your upper arm far away from your body for at least 6 weeks. °· Do not lift your upper arm above your shoulders for at least 6 weeks. This means no tennis, golf, or swimming for this period of time. If you sleep with the arm above your head, use a restraint to prevent this from happening as you sleep. °· You may go back to work when your health care provider says it is okay. Check with your health care provider before you start to drive or play sports. °Other Instructions °· Follow diet instructions if they were provided. You should be able to eat what you usually do right away, but you may need to limit your salt intake. °· Weigh yourself every day. If you suddenly gain weight, fluid may be building up in your body. °· Always carry your pacemaker identification card with you. The card should list the implant date, device model, and manufacturer. Consider wearing a medical alert bracelet or necklace. °· Tell all health care providers that you have a pacemaker. This may prevent them from giving you a magnetic resource imaging scan (MRI) because of the strong magnets used during that test. °·   If you must pass through a metal detector, quickly walk through it. Do not stop under the detector or stand near it.  Avoid places or objects with a strong electric or magnetic field, including:  Engineer, maintenance. When at the airport, let officials know you have a pacemaker. Your ID card will let you be checked in a way that is safe for you and that will not damage your pacemaker. Also, do not let a security person wave a magnetic wand near your pacemaker. That can make  it stop working.  Power plants.  Large electrical generators.  Radiofrequency transmission towers, such as cell phone and radio towers.  Do not use amateur (ham) radio equipment or electric (arc) welding torches. Some devices are safe to use if held at least 1 foot from your pacemaker. These include power tools, lawn mowers, and speakers. If you are unsure of whether something is safe to use, ask your health care provider.  You may safely use electric blankets, heating pads, computers, and microwave ovens.  When using your cell phone, hold it to the ear opposite the pacemaker. Do not leave your cell phone in a pocket over the pacemaker.  Keep all follow-up visits as directed by your health care provider. This is how your health care provider makes sure your chest is healing the way it should. Ask your health care provider when you should come back to have your stitches or staples taken out.  Have your pacemaker checked every 3-6 months or as directed by your health care provider. Most pacemakers last for 4-8 years before a new one is needed. SEEK MEDICAL CARE IF:  You gain weight suddenly.  Your legs or feet swell more than they have before.  It feels like your heart is fluttering or skipping beats (heart palpitations).  You have a fever. SEEK IMMEDIATE MEDICAL CARE IF:  You have chest pain.  You feel more short of breath than you have felt before.  You feel more light-headed than you have felt before.  You have problems with your incision site, such as swelling or bleeding, or it starts to open up.  You have drainage, redness, swelling, or pain at your incision site.   This information is not intended to replace advice given to you by your health care provider. Make sure you discuss any questions you have with your health care provider.   Document Released: 08/30/2004 Document Revised: 03/03/2014 Document Reviewed: 06/13/2011 Elsevier Interactive Patient Education 2016  [place]. Thoracotomy, Care After  These instructions give you information on caring for yourself after your procedure. Your doctor may also give you more specific instructions. Call your doctor if you have any problems or questions after your procedure. HOME CARE  Only take medicine as told by your doctor. Take pain medicine before your pain gets very bad.  Take deep breaths to protect yourself from a lung infection (pneumonia).  Cough often to clear thick spit (mucus) and to open your lungs. Hold a pillow against your chest if it hurts to cough.  Use your breathing machine (incentive spirometer) as told.  Change bandages (dressings) as told by your doctor.  Take off the bandage over your chest tube area as told by your doctor.  Continue your normal diet as told by your doctor.  Eat high-fiber foods. This includes whole grain cereals, brown rice, beans, and fresh fruits and vegetables.  Drink enough fluids to keep your pee (urine) clear or pale yellow. Avoid caffeine.  Talk  to your doctor about taking a medicine to soften your poop (stool softener, laxative).  Keep doing activities as told by your doctor. Balance your activity with rest.  Avoid lifting until your doctor says it is okay.  Do not drive until told by your doctor. Do not drive while taking pain medicines (narcotics).  Do not bathe, swim, or use a hot tub until told by your doctor. You may shower. Gently wash the area of your cut (incision) with soap and water as told.  Do not use any tobacco products including cigarettes, chewing tobacco, and electronic cigarettes. Avoid being around others who smoke.  Schedule a doctor visit to get your stitches or staples removed as told.  Schedule and go to all doctor visits as told.  Go to therapy that can help improve your lungs (pulmonary rehabilitation) as told by your doctor.  Do not travel by airplane for 2 weeks after the chest tube is taken out. GET HELP  IF:  You are bleeding from your wounds.  You have redness, puffiness (swelling), or more pain in the wounds.  You feel your heart beating fast or skipping beats (irregular heartbeat).  You have yellowish-white fluid (pus) coming from your wounds.  You have a bad smell coming from the wound or bandage.  You have a fever or chills.  You feel sick to your stomach or you throw up (vomit).  You have muscle aches. GET HELP RIGHT AWAY IF:  You have a rash.  You have trouble breathing.  Your medicines are causing you problems.  You keep feeling sick to your stomach (nauseous).  You feel light-headed.  You have shortness of breath or chest pain.  You have pain that will not go away. MAKE SURE YOU:  Understand these instructions.  Will watch your condition.  Will get help right away if you are not doing well or get worse.   This information is not intended to replace advice given to you by your health care provider. Make sure you discuss any questions you have with your health care provider.   Document Released: 08/12/2011 Document Revised: 03/03/2014 Document Reviewed: 09/29/2012 Elsevier Interactive Patient Education 2016 [place] on my medicine - Coumadin   (Warfarin)  This medication education was reviewed with me or my healthcare representative as part of my discharge preparation.  The pharmacist that spoke with me during my hospital stay was:  Saundra Shelling, [facility]  Why was Coumadin prescribed for you? Coumadin was prescribed for you because you have a blood clot or a medical condition that can cause an increased risk of forming blood clots. Blood clots can cause serious health problems by blocking the flow of blood to the heart, lung, or brain. Coumadin can prevent harmful blood clots from forming. As a reminder your indication for Coumadin is:   Pulmonary Embolism Treatment  What test will check on my response to Coumadin? While on Coumadin  (warfarin) you will need to have an INR test regularly to ensure that your dose is keeping you in the desired range. The INR (international normalized ratio) number is calculated from the result of the laboratory test called prothrombin time (PT).  If an INR APPOINTMENT HAS NOT ALREADY BEEN MADE FOR YOU please schedule an appointment to have this lab work done by your health care provider within 7 days. Your INR goal is usually a number between:  2 to 3 or your provider may give you a more narrow  range like 2-2.5.  Ask your health care provider during an office visit what your goal INR is.  What  do you need to  know  About  COUMADIN? Take Coumadin (warfarin) exactly as prescribed by your healthcare provider about the same time each day.  DO NOT stop taking without talking to the doctor who prescribed the medication.  Stopping without other blood clot prevention medication to take the place of Coumadin may increase your risk of developing a new clot or stroke.  Get refills before you run out.  What do you do if you miss a dose? If you miss a dose, take it as soon as you remember on the same day then continue your regularly scheduled regimen the next day.  Do not take two doses of Coumadin at the same time.  Important Safety Information A possible side effect of Coumadin (Warfarin) is an increased risk of bleeding. You should call your healthcare provider right away if you experience any of the following: ? Bleeding from an injury or your nose that does not stop. ? Unusual colored urine (red or dark brown) or unusual colored stools (red or black). ? Unusual bruising for unknown reasons. ? A serious fall or if you hit your head (even if there is no bleeding).  Some foods or medicines interact with Coumadin (warfarin) and might alter your response to warfarin. To help avoid this: ? Eat a balanced diet, maintaining a consistent amount of Vitamin K. ? Notify your provider about major diet changes you  plan to make. ? Avoid alcohol or limit your intake to 1 drink for women and 2 drinks for men per day. (1 drink is 5 oz. wine, 12 oz. beer, or 1.5 oz. liquor.)  Make sure that ANY health care provider who prescribes medication for you knows that you are taking Coumadin (warfarin).  Also make sure the healthcare provider who is monitoring your Coumadin knows when you have started a new medication including herbals and non-prescription products.  Coumadin (Warfarin)  Major Drug Interactions  Increased Warfarin Effect Decreased Warfarin Effect  Alcohol (large quantities) Antibiotics (esp. Septra/Bactrim, Flagyl, Cipro) Amiodarone (Cordarone) Aspirin (ASA) Cimetidine (Tagamet) Megestrol (Megace) NSAIDs (ibuprofen, naproxen, etc.) Piroxicam (Feldene) Propafenone (Rythmol SR) Propranolol (Inderal) Isoniazid (INH) Posaconazole (Noxafil) Barbiturates (Phenobarbital) Carbamazepine (Tegretol) Chlordiazepoxide (Librium) Cholestyramine (Questran) Griseofulvin Oral Contraceptives Rifampin Sucralfate (Carafate) Vitamin K   Coumadin (Warfarin) Major Herbal Interactions  Increased Warfarin Effect Decreased Warfarin Effect  Garlic Ginseng Ginkgo biloba Coenzyme Q10 Green tea St. Johns wort    Coumadin (Warfarin) FOOD Interactions  Eat a consistent number of servings per week of foods HIGH in Vitamin K (1 serving =  cup)  Collards (cooked, or boiled & drained) Kale (cooked, or boiled & drained) Mustard greens (cooked, or boiled & drained) Parsley *serving size only =  cup Spinach (cooked, or boiled & drained) Swiss chard (cooked, or boiled & drained) Turnip greens (cooked, or boiled & drained)  Eat a consistent number of servings per week of foods MEDIUM-HIGH in Vitamin K (1 serving = 1 cup)  Asparagus (cooked, or boiled & drained) Broccoli (cooked, boiled & drained, or raw & chopped) Brussel sprouts (cooked, or boiled & drained) *serving size only =  cup Lettuce, raw (green  leaf, endive, romaine) Spinach, raw Turnip greens, raw & chopped   These websites have more information on Coumadin (warfarin):  FailFactory.se; VeganReport.com.au;   Pulmonary Embolism A pulmonary embolism (PE) is a sudden blockage or decrease of blood flow in one  lung or both lungs. Most blockages come from a blood clot that travels from the legs or the pelvis to the lungs. PE is a dangerous and potentially life-threatening condition if it is not treated right away. CAUSES A pulmonary embolism occurs most commonly when a blood clot travels from one of your veins to your lungs. Rarely, PE is caused by air, fat, amniotic fluid, or part of a tumor traveling through your veins to your lungs. RISK FACTORS A PE is more likely to develop in:  People who smoke.  People who areolder, especially over 36 years of age.  People who are overweight (obese).  People who sit or lie still for a long time, such as during long-distance travel (over 4 hours), bed rest, hospitalization, or during recovery from certain medical conditions like a stroke.  People who do not engage in much physical activity (sedentary lifestyle).  People who have chronic breathing disorders.  People whohave a personal or family history of blood clots or blood clotting disease.  People whohave peripheral vascular disease (PVD), diabetes, or some types of cancer.  People who haveheart disease, especially if the person had a recent heart attack or has congestive heart failure.  People who have neurological diseases that affect the legs (leg paresis).  People who have had a traumatic injury, such as breaking a hip or leg.  People whohave recently had major or lengthy surgery, especially on the hip, knee, or abdomen.  People who have hada central line placed inside a large vein.  People who takemedicines that contain the hormone estrogen. These include birth control pills and hormone replacement  therapy.  Pregnancy or during childbirth or the postpartum period. SIGNS AND SYMPTOMS  The symptoms of a PE usually start suddenly and include:  Shortness of breath while active or at rest.  Coughing or coughing up blood or blood-tinged mucus.  Chest pain that is often worse with deep breaths.  Rapid or irregular heartbeat.  Feeling light-headed or dizzy.  Fainting.  Feelinganxious.  Sweating. There may also be pain and swelling in a leg if that is where the blood clot started. These symptoms may represent a serious problem that is an emergency. Do not wait to see if the symptoms will go away. Get medical help right away. Call your local emergency services (911 in the [place] Do not drive yourself to the hospital. DIAGNOSIS Your health care provider will take a medical history and perform a physical exam. You may also have other tests, including:  Blood tests to assess the clotting properties of your blood, assess oxygen levels in your blood, and find blood clots.  Imaging tests, such as CT, ultrasound, MRI, X-ray, and other tests to see if you have clots anywhere in your body.  An electrocardiogram (ECG) to look for heart strain from blood clots in the lungs. TREATMENT The main goals of PE treatment are:  To stop a blood clot from growing larger.  To stop new blood clots from forming. The type of treatment that you receive depends on many factors, such as the cause of your PE, your risk for bleeding or developing more clots, and other medical conditions that you have. Sometimes, a combination of treatments is necessary. This condition may be treated with:  Medicines, including newer oral blood thinners (anticoagulants), warfarin, low molecular weight heparins, thrombolytics, or heparins.  Wearing compression stockings or using different types of devices.  Surgery (rare) to remove the blood clot or to place a filter in your  abdomen to stop the blood clot from traveling to  your lungs. Treatments for a PE are often divided into immediate treatment, long-term treatment (up to 3 months after PE), and extended treatment (more than 3 months after PE). Your treatment may continue for several months. This is called maintenance therapy, and it is used to prevent the forming of new blood clots. You can work with your health care provider to choose the treatment program that is best for you. What are anticoagulants? Anticoagulants are medicines that treat PEs. They can stop current blood clots from growing and stop new clots from forming. They cannot dissolve existing clots. Your body dissolves clots by itself over time. Anticoagulants are given by mouth, by injection, or through an IV tube. What are thrombolytics? Thrombolytics are clot-dissolving medicines that are used to dissolve a PE. They carry a high risk of bleeding, so they tend to be used only in severe cases or if you have very low blood pressure. HOME CARE INSTRUCTIONS If you are taking a newer oral anticoagulant:  Take the medicine every single day at the same time each day.  Understand what foods and drugs interact with this medicine.  Understand that there are no regular blood tests required when using this medicine.  Understandthe side effects of this medicine, including excessive bruising or bleeding. Ask your health care provider or pharmacist about other possible side effects. If you are taking warfarin:  Understand how to take warfarin and know which foods can affect how warfarin works in Veterinary surgeon.  Understand that it is dangerous to taketoo much or too little warfarin. Too much warfarin increases the risk of bleeding. Too little warfarin continues to allow the risk for blood clots.  Follow your PT and INR blood testing schedule. The PT and INR results allow your health care provider to adjust your dose of warfarin. It is very important that you have your PT and INR tested as often as told by your  health care provider.  Avoid major changes in your diet, or tell your health care provider before you change your diet. Arrange a visit with a registered dietitian to answer your questions. Many foods, especially foods that are high in vitamin K, can interfere with warfarin and affect the PT and INR results. Eat a consistent amount of foods that are high in vitamin K, such as:  Spinach, kale, broccoli, cabbage, collard greens, turnip greens, Brussels sprouts, peas, cauliflower, seaweed, and parsley.  Beef liver and pork liver.  Green tea.  Soybean oil.  Tell your health care provider about any and all medicines, vitamins, and supplements that you take, including aspirin and other over-the-counter anti-inflammatory medicines. Be especially cautious with aspirin and anti-inflammatory medicines. Do not take those before you ask your health care provider if it is safe to do so. This is important because many medicines can interfere with warfarin and affect the PT and INR results.  Do not start or stop taking any over-the-counter or prescription medicine unless your health care provider or pharmacist tells you to do so. If you take warfarin, you will also need to do these things:  Hold pressure over cuts for longer than usual.  Tell your dentist and other health care providers that you are taking warfarin before you have any procedures in which bleeding may occur.  Avoid alcohol or drink very small amounts. Tell your health care provider if you change your alcohol intake.  Do not use tobacco products, including cigarettes, chewing tobacco, and  e-cigarettes. If you need help quitting, ask your health care provider.  Avoid contact sports. General Instructions  Take over-the-counter and prescription medicines only as told by your health care provider. Anticoagulant medicines can have side effects, including easy bruising and difficulty stopping bleeding. If you are prescribed an anticoagulant,  you will also need to do these things:  Hold pressure over cuts for longer than usual.  Tell your dentist and other health care providers that you are taking anticoagulants before you have any procedures in which bleeding may occur.  Avoid contact sports.  Wear a medical alert bracelet or carry a medical alert card that says you have had a PE.  Ask your health care provider how soon you can go back to your normal activities. Stay active to prevent new blood clots from forming.  Make sure to exercise while traveling or when you have been sitting or standing for a long period of time. It is very important to exercise. Exercise your legs by walking or by tightening and relaxing your leg muscles often. Take frequent walks.  Wear compression stockings as told by your health care provider to help prevent more blood clots from forming.  Do not use tobacco products, including cigarettes, chewing tobacco, and e-cigarettes. If you need help quitting, ask your health care provider.  Keep all follow-up appointments with your health care provider. This is important. PREVENTION Take these actions to decrease your risk of developing another PE:  Exercise regularly. For at least 30 minutes every day, engage in:  Activity that involves moving your arms and legs.  Activity that encourages good blood flow through your body by increasing your heart rate.  Exercise your arms and legs every hour during long-distance travel (over 4 hours). Drink plenty of water and avoid drinking alcohol while traveling.  Avoid sitting or lying in bed for long periods of time without moving your legs.  Maintain a weight that is appropriate for your height. Ask your health care provider what weight is healthy for you.  If you are a woman who is over 72 years of age, avoid unnecessary use of medicines that contain estrogen. These include birth control pills.  Do not smoke, especially if you take estrogen medicines. If  you need help quitting, ask your health care provider.  If you are at very high risk for PE, wear compression stockings.  If you recently had a PE, have regularly scheduled ultrasound testing on your legs to check for new blood clots. If you are hospitalized, prevention measures may include:  Early walking after surgery, as soon as your health care provider says that it is safe.  Receiving anticoagulants to prevent blood clots. If you cannot take anticoagulants, other options may be available, such as wearing compression stockings or using different types of devices. SEEK IMMEDIATE MEDICAL CARE IF:  You have new or increased pain, swelling, or redness in an arm or leg.  You have numbness or tingling in an arm or leg.  You have shortness of breath while active or at rest.  You have chest pain.  You have a rapid or irregular heartbeat.  You feel light-headed or dizzy.  You cough up blood.  You notice blood in your vomit, bowel movement, or urine.  You have a fever. These symptoms may represent a serious problem that is an emergency. Do not wait to see if the symptoms will go away. Get medical help right away. Call your local emergency services (911 in the [place] Do  not drive yourself to the hospital.   This information is not intended to replace advice given to you by your health care provider. Make sure you discuss any questions you have with your health care provider.   Document Released: 02/08/2000 Document Revised: 11/01/2014 Document Reviewed: 06/07/2014 Elsevier Interactive Patient Education [employer].

## 2014-12-04 NOTE — Progress Notes (Signed)
ANTICOAGULATION CONSULT NOTE - Follow Up Consult  Pharmacy Consult for Heparin Indication: pulmonary embolus  Allergies  Allergen Reactions  . Adhesive [Tape] Rash    Pulls skin off  . Penicillin G Swelling    Other reaction(s): Localized superficial swelling of skin  . Coreg [Carvedilol] Swelling    Swelling, headache  . Metoprolol Other (See Comments)    Swelling and headache  . Latex Rash  . Levofloxacin Rash  . Penicillins Rash    Patient Measurements: Weight: 171 lb 4.8 oz (77.7 kg) Heparin Dosing Weight: 77 kg   Vital Signs: Temp: 97.9 F (36.6 C) (10/10 0421) Temp Source: Oral (10/10 0421) BP: 109/76 mmHg (10/10 0421) Pulse Rate: 85 (10/10 0421)  Labs:  Recent Labs  12/02/14 0845  12/03/14 0100 12/03/14 1040 12/03/14 1851 12/04/14 0412  HGB  --   --  12.2  --   --  11.6*  HCT  --   --  37.8  --   --  35.8*  PLT  --   --  169  --   --  200  LABPROT  --   --  15.5*  --   --  18.4*  INR  --   --  1.21  --   --  1.52*  HEPARINUNFRC  --   < > 0.45 0.26* 0.27* 0.43  TROPONINI 0.29*  --   --   --   --   --   < > = values in this interval not displayed.  Estimated Creatinine Clearance: 62.2 mL/min (by C-G formula based on Cr of 1.09).  Assessment: 39 YOF admitted for left thoracotomy for insertion of epicardial leads on 11/30/14. She is now s/p lead placement and is having significant chest pain concomtant right calf pain and swelling. CTA pulmonary angio shows an occlusive thrombus in a subsegmental RUL pulmonary artery branch. Lower extremity venous dopplers pending. Heparin level is now therapeutic at 0.43. No bleeding or other problems noted.   Goal of Therapy:  Heparin level 0.3-0.7 units/ml Monitor platelets by anticoagulation protocol: Yes   Plan:  - Continue heparin gtt 1500 units/hr - Check a 6 hour heparin level to confirm dosing  Salome Arnt, PharmD, BCPS Pager # [phone] 12/04/2014 5:23 AM

## 2014-12-05 ENCOUNTER — Telehealth: Payer: Self-pay | Admitting: Respiratory Therapy

## 2014-12-05 LAB — CBC
HCT: 35.7 % — ABNORMAL LOW (ref 36.0–46.0)
Hemoglobin: 11.7 g/dL — ABNORMAL LOW (ref 12.0–15.0)
MCH: 31 pg (ref 26.0–34.0)
MCHC: 32.8 g/dL (ref 30.0–36.0)
MCV: 94.4 fL (ref 78.0–100.0)
Platelets: 216 10*3/uL (ref 150–400)
RBC: 3.78 MIL/uL — ABNORMAL LOW (ref 3.87–5.11)
RDW: 13.6 % (ref 11.5–15.5)
WBC: 6 10*3/uL (ref 4.0–10.5)

## 2014-12-05 LAB — HEPARIN LEVEL (UNFRACTIONATED): Heparin Unfractionated: 0.57 IU/mL (ref 0.30–0.70)

## 2014-12-05 LAB — PROTIME-INR
INR: 3.63 — ABNORMAL HIGH (ref 0.00–1.49)
Prothrombin Time: 35.3 seconds — ABNORMAL HIGH (ref 11.6–15.2)

## 2014-12-05 LAB — ANAEROBIC CULTURE

## 2014-12-05 MED ORDER — WARFARIN SODIUM 2.5 MG PO TABS: 2.5000 mg | ORAL_TABLET | Freq: Every day | ORAL | Status: DC

## 2014-12-05 MED ORDER — OXYCODONE HCL 5 MG PO TABS: 5.0000 mg | ORAL_TABLET | Freq: Four times a day (QID) | ORAL | Status: DC | PRN

## 2014-12-05 NOTE — Care Management Note (Signed)
Case Management Note Suzanne Gibbons RN, BSN Unit 2W-Case Manager [phone]  Patient Details  Name: Suzanne Spears MRN: [id number] Date of Birth: Oct 22, 1960  Subjective/Objective:        Pt admitted s/p thoracotomy for lCD lead revision            Action/Plan: PTA pt lived at home- plan to return home when medically stable- no anticipated needs- NCM to follow  Expected Discharge Date:      12/05/14            Expected Discharge Plan:  Home/Self Care  In-House Referral:     Discharge planning Services  CM Consult  Post Acute Care Choice:    Choice offered to:     DME Arranged:    DME Agency:     HH Arranged:    [place] Agency:     Status of Service:  Completed, signed off  Medicare Important Message Given:    Date Medicare IM Given:    Medicare IM give by:    Date Additional Medicare IM Given:    Additional Medicare Important Message give by:     If discussed at [place] of Stay Meetings, dates discussed:  12/05/14  Additional Comments:  Suzanne Patricia, RN 12/05/2014, 10:55 AM

## 2014-12-05 NOTE — Progress Notes (Signed)
DC instructions given, patient verbalized understanding

## 2014-12-05 NOTE — Progress Notes (Addendum)
      [address]       [place] [address]             [phone]      5 Days Post-Op Procedure(s) (LRB): THORACOTOMY MAJOR (Left) EPICARDIAL PACING LEAD PLACEMENT (N/A) Subjective: Doing well clinically without complaints  Objective: Vital signs in last 24 hours: Temp:  [97.6 F (36.4 C)-98.7 F (37.1 C)] 97.6 F (36.4 C) (10/11 0454) Pulse Rate:  [80-106] 80 (10/11 0454) Cardiac Rhythm:  [-] Ventricular paced (10/11 0719) Resp:  [18] 18 (10/11 0454) BP: (118-127)/(72-81) 127/81 mmHg (10/11 0454) SpO2:  [97 %-100 %] 99 % (10/11 0454)  Hemodynamic parameters for last 24 hours:    Intake/Output from previous day: 10/10 0701 - 10/11 0700 In: 480 [P.O.:480] Out: -  Intake/Output this shift:    General appearance: alert, cooperative and no distress Heart: regular rate and rhythm and soft systolic murmur Lungs: dim in bases  Lab Results:  Recent Labs  12/04/14 0412 12/05/14 0522  WBC 7.2 6.0  HGB 11.6* 11.7*  HCT 35.8* 35.7*  PLT 200 216   BMET: No results for input(s): NA, K, CL, CO2, GLUCOSE, BUN, CREATININE, CALCIUM in the last 72 hours.  PT/INR:  Recent Labs  12/05/14 0522  LABPROT 35.3*  INR 3.63*   ABG    Component Value Date/Time   PHART 7.381 11/30/2014 0715   HCO3 18.6* 11/30/2014 0715   TCO2 20 11/30/2014 0715   ACIDBASEDEF 6.0* 11/30/2014 0715   O2SAT 99.0 11/30/2014 0715   CBG (last 3)  No results for input(s): GLUCAP in the last 72 hours.  Meds Scheduled Meds: . bisacodyl  10 mg Oral Daily  . DULoxetine  60 mg Oral Daily  . fluticasone  2 spray Each Nare Daily  . loratadine  10 mg Oral Daily  . losartan  25 mg Oral Daily  . montelukast  10 mg Oral QHS  . senna-docusate  1 tablet Oral QHS  . spironolactone  25 mg Oral Daily  . Warfarin - Pharmacist Dosing Inpatient   Does not apply q1800   Continuous Infusions: . heparin 1,500 Units/hr (12/05/14 0401)   PRN Meds:.albuterol, oxyCODONE, traMADol,  traZODone  Xrays No results found.  Assessment/Plan: S/P Procedure(s) (LRB): THORACOTOMY MAJOR (Left) EPICARDIAL PACING LEAD PLACEMENT (N/A)  1 doing well, will hold coumadin today, send home on 2.5 daily and have INR checked this thursday   LOS: 5 days    GOLD,WAYNE E 12/05/2014   Chart reviewed, patient examined, agree with above. She feels well.

## 2014-12-05 NOTE — Progress Notes (Addendum)
Sutures removed per order.Steri-strips applied.Incision well approximated clean dry and intact

## 2014-12-05 NOTE — Telephone Encounter (Signed)
Patient wants pt inr drawn at coumadin clinic in [place].   If this is ok please put in order for poct and i will call patient and schedule.

## 2014-12-06 ENCOUNTER — Other Ambulatory Visit: Payer: Self-pay | Admitting: *Deleted

## 2014-12-07 ENCOUNTER — Other Ambulatory Visit: Payer: Self-pay | Admitting: Internal Medicine

## 2014-12-07 ENCOUNTER — Ambulatory Visit (INDEPENDENT_AMBULATORY_CARE_PROVIDER_SITE_OTHER): Payer: BLUE CROSS/BLUE SHIELD | Admitting: *Deleted

## 2014-12-07 DIAGNOSIS — I2699 Other pulmonary embolism without acute cor pulmonale: Secondary | ICD-10-CM | POA: Diagnosis not present

## 2014-12-07 DIAGNOSIS — Z5181 Encounter for therapeutic drug level monitoring: Secondary | ICD-10-CM | POA: Diagnosis not present

## 2014-12-07 DIAGNOSIS — Z95810 Presence of automatic (implantable) cardiac defibrillator: Secondary | ICD-10-CM

## 2014-12-07 LAB — POCT INR: INR: 4.6

## 2014-12-07 NOTE — Patient Instructions (Signed)

## 2014-12-12 ENCOUNTER — Other Ambulatory Visit: Payer: Self-pay

## 2014-12-12 MED ORDER — TRAMADOL HCL 50 MG PO TABS: 50.0000 mg | ORAL_TABLET | Freq: Four times a day (QID) | ORAL | Status: DC | PRN

## 2014-12-13 ENCOUNTER — Ambulatory Visit (INDEPENDENT_AMBULATORY_CARE_PROVIDER_SITE_OTHER): Payer: BLUE CROSS/BLUE SHIELD

## 2014-12-13 DIAGNOSIS — Z5181 Encounter for therapeutic drug level monitoring: Secondary | ICD-10-CM | POA: Diagnosis not present

## 2014-12-13 DIAGNOSIS — I2699 Other pulmonary embolism without acute cor pulmonale: Secondary | ICD-10-CM | POA: Diagnosis not present

## 2014-12-13 DIAGNOSIS — Z95810 Presence of automatic (implantable) cardiac defibrillator: Secondary | ICD-10-CM | POA: Diagnosis not present

## 2014-12-13 LAB — POCT INR: INR: 1.2

## 2014-12-19 ENCOUNTER — Other Ambulatory Visit: Payer: Self-pay | Admitting: Surgery

## 2014-12-19 ENCOUNTER — Encounter: Payer: BLUE CROSS/BLUE SHIELD | Admitting: Internal Medicine

## 2014-12-19 DIAGNOSIS — I2699 Other pulmonary embolism without acute cor pulmonale: Secondary | ICD-10-CM

## 2014-12-20 ENCOUNTER — Ambulatory Visit (INDEPENDENT_AMBULATORY_CARE_PROVIDER_SITE_OTHER): Payer: BLUE CROSS/BLUE SHIELD | Admitting: *Deleted

## 2014-12-20 ENCOUNTER — Encounter: Payer: Self-pay | Admitting: Internal Medicine

## 2014-12-20 ENCOUNTER — Ambulatory Visit (INDEPENDENT_AMBULATORY_CARE_PROVIDER_SITE_OTHER): Payer: Self-pay | Admitting: Surgery

## 2014-12-20 ENCOUNTER — Ambulatory Visit
Admission: RE | Admit: 2014-12-20 | Discharge: 2014-12-20 | Disposition: A | Discharge status: Home / Self Care | Payer: BLUE CROSS/BLUE SHIELD | Source: Ambulatory Visit | Attending: Surgery | Admitting: Surgery

## 2014-12-20 ENCOUNTER — Ambulatory Visit (INDEPENDENT_AMBULATORY_CARE_PROVIDER_SITE_OTHER): Payer: BLUE CROSS/BLUE SHIELD | Admitting: Internal Medicine

## 2014-12-20 ENCOUNTER — Encounter: Payer: Self-pay | Admitting: Surgery

## 2014-12-20 VITALS — BP 121/66 | HR 90 | Resp 16 | Ht 65.5 in | Wt 170.0 lb

## 2014-12-20 VITALS — BP 120/70 | HR 88 | Ht 65.5 in | Wt 170.0 lb

## 2014-12-20 DIAGNOSIS — Z95810 Presence of automatic (implantable) cardiac defibrillator: Secondary | ICD-10-CM

## 2014-12-20 DIAGNOSIS — I5022 Chronic systolic (congestive) heart failure: Secondary | ICD-10-CM | POA: Diagnosis not present

## 2014-12-20 DIAGNOSIS — I447 Left bundle-branch block, unspecified: Secondary | ICD-10-CM | POA: Diagnosis not present

## 2014-12-20 DIAGNOSIS — Z5181 Encounter for therapeutic drug level monitoring: Secondary | ICD-10-CM | POA: Diagnosis not present

## 2014-12-20 DIAGNOSIS — I2699 Other pulmonary embolism without acute cor pulmonale: Secondary | ICD-10-CM | POA: Diagnosis not present

## 2014-12-20 DIAGNOSIS — I429 Cardiomyopathy, unspecified: Secondary | ICD-10-CM | POA: Diagnosis not present

## 2014-12-20 DIAGNOSIS — I420 Dilated cardiomyopathy: Secondary | ICD-10-CM

## 2014-12-20 DIAGNOSIS — I5020 Unspecified systolic (congestive) heart failure: Secondary | ICD-10-CM

## 2014-12-20 LAB — CUP PACEART INCLINIC DEVICE CHECK
Battery Remaining Longevity: 91 mo
Battery Voltage: 3 V
Brady Statistic AP VP Percent: 0.02 %
Brady Statistic AP VS Percent: 0.01 %
Brady Statistic AS VP Percent: 98.1 %
Brady Statistic AS VS Percent: 1.86 %
Brady Statistic RA Percent Paced: 0.03 %
Brady Statistic RV Percent Paced: 22.44 %
Date Time Interrogation Session: 20161026155440
HighPow Impedance: 63 Ohm
Implantable Lead Implant Date: 20160421
Implantable Lead Implant Date: 20160421
Implantable Lead Implant Date: 20161006
Implantable Lead Implant Date: 20161006
Implantable Lead Location: 753858
Implantable Lead Location: 753858
Implantable Lead Location: 753859
Implantable Lead Location: 753860
Implantable Lead Model: 5071
Implantable Lead Model: 5071
Implantable Lead Model: 5076
Lead Channel Impedance Value: 323 Ohm
Lead Channel Impedance Value: 342 Ohm
Lead Channel Impedance Value: 4047 Ohm
Lead Channel Impedance Value: 4047 Ohm
Lead Channel Impedance Value: 437 Ohm
Lead Channel Impedance Value: 437 Ohm
Lead Channel Pacing Threshold Amplitude: 1 V
Lead Channel Pacing Threshold Amplitude: 1.5 V
Lead Channel Pacing Threshold Pulse Width: 0.4 ms
Lead Channel Pacing Threshold Pulse Width: 0.4 ms
Lead Channel Sensing Intrinsic Amplitude: 1.375 mV
Lead Channel Sensing Intrinsic Amplitude: 1.5 mV
Lead Channel Sensing Intrinsic Amplitude: 26 mV
Lead Channel Sensing Intrinsic Amplitude: 30.5 mV
Lead Channel Setting Pacing Amplitude: 2 V
Lead Channel Setting Pacing Amplitude: 2.5 V
Lead Channel Setting Pacing Pulse Width: 0.4 ms
Lead Channel Setting Sensing Sensitivity: 0.3 mV

## 2014-12-20 LAB — POCT INR: INR: 1.2

## 2014-12-20 NOTE — Assessment & Plan Note (Signed)
She will remain on warfarin and followup in the office for coumadin check.

## 2014-12-20 NOTE — Assessment & Plan Note (Signed)
She is s/p successful epicardial lead insertion. She will followup in several months.
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
## 2014-12-20 NOTE — Assessment & Plan Note (Signed)
Her symptoms are currently class 2. She will continue her current medications.

## 2014-12-20 NOTE — Patient Instructions (Addendum)
Medication Instructions:  Your physician recommends that you continue on your current medications as directed. Please refer to the Current Medication list given to you today.  Labwork: None ordered  Testing/Procedures: None ordered  Follow-Up: We will call you about switching care to Dr. Caryl Comes and being seen in [place] in 3 months for follow up.   Any Other Special Instructions Will Be Listed Below (If Applicable).  If you need a refill on your cardiac medications before your next appointment, please call your pharmacy.  Thank you for choosing [facility]!!

## 2014-12-21 ENCOUNTER — Encounter: Payer: Self-pay | Admitting: Surgery

## 2014-12-21 NOTE — Progress Notes (Signed)
HPI:  Patient returns for routine postoperative follow-up having undergone insertion of LV epicardial pacing leads via a left thoracotomy and revision of her BiV pacing system on 11/30/2014. The patient's early postoperative recovery while in the hospital was notable for an uncomplicated postop course. Since hospital discharge the patient reports that she has been feeling well and her preop fatigue and shortness of breath are improved. She still has some left chest wall soreness. She is anxious to return to work.   Current Outpatient Prescriptions  Medication Sig Dispense Refill  . albuterol (PROVENTIL HFA;VENTOLIN HFA) 108 (90 BASE) MCG/ACT inhaler Inhale 2 puffs into the lungs daily as needed for wheezing or shortness of breath.    . cyanocobalamin (,VITAMIN B-12,) 1000 MCG/ML injection Inject 1 mL (1,000 mcg total) into the muscle once a week. 10 mL 5  . cyclobenzaprine (FLEXERIL) 10 MG tablet Take 10 mg by mouth 3 (three) times daily as needed for muscle spasms.    . DULoxetine (CYMBALTA) 60 MG capsule TAKE (1) CAPSULE BY MOUTH EVERY DAY 30 capsule 3  . fexofenadine (ALLEGRA) 180 MG tablet Take 180 mg by mouth at bedtime.    . fluticasone (FLONASE) 50 MCG/ACT nasal spray Place 2 sprays into the nose at bedtime.    Marland Kitchen GILDESS FE 1/20 1-20 MG-MCG tablet TAKE (1) TABLET BY MOUTH EVERY DAY 1 Package 6  . losartan (COZAAR) 25 MG tablet Take 1 tablet (25 mg total) by mouth daily. 30 tablet 1  . magnesium 30 MG tablet Take 30 mg by mouth daily.    . Melatonin 3 MG TABS Take 3 mg by mouth at bedtime as needed.     . montelukast (SINGULAIR) 10 MG tablet Take 1 tablet (10 mg total) by mouth at bedtime. 30 tablet 6  . NON FORMULARY Fiber Gummy Takes 1 gummy daily.    Marland Kitchen spironolactone (ALDACTONE) 25 MG tablet Take 1 tablet (25 mg total) by mouth daily. 30 tablet 3  . topiramate (TOPAMAX) 50 MG tablet Take 1 tablet (50 mg total) by mouth at bedtime. (Patient taking differently: Take 100 mg by  mouth at bedtime. ) 30 tablet 1  . traMADol (ULTRAM) 50 MG tablet Take 1 tablet (50 mg total) by mouth every 6 (six) hours as needed. 40 tablet 0  . traZODone (DESYREL) 50 MG tablet TAKE 1/2 TO 1 TABLET AT BEDTIME AS NEEDED FOR SLEEP. 30 tablet 3  . valACYclovir (VALTREX) 1000 MG tablet Take 1 tablet (1,000 mg total) by mouth 2 (two) times daily. (Patient taking differently: Take 1,000 mg by mouth 2 (two) times daily as needed (fever blisters). ) 20 tablet 0  . warfarin (COUMADIN) 2.5 MG tablet Take 1 tablet (2.5 mg total) by mouth daily. Starting on 12/06/2014 then as directed by coumadin clinic 100 tablet 1  . oxyCODONE (OXY IR/ROXICODONE) 5 MG immediate release tablet Take 1-2 tablets (5-10 mg total) by mouth every 6 (six) hours as needed for severe pain. (Patient not taking: Reported on 12/20/2014) 30 tablet 0   No current facility-administered medications for this visit.    Physical Exam: BP 121/66 mmHg  Pulse 90  Resp 16  Ht 5' 5.5" (1.664 m)  Wt 170 lb (77.111 kg)  BMI 27.85 kg/m2  SpO2 98%  LMP 11/02/2014 (Approximate) She looks well Cardiac exam shows a regular rate and rhythm with normal heart sounds Lung exam is clear The left chest incision and pacer incision are healing well  Diagnostic Tests:  CLINICAL DATA: Acute pulmonary embolus, shortness of Breath  EXAM: CHEST 2 VIEW  COMPARISON: 12/02/2014  FINDINGS: Cardiomediastinal silhouette is stable. Two leads cardiac pacemaker is unchanged in position. Apical epicardial leads are noted. There is small left pleural effusion with left basilar atelectasis. No pulmonary edema. Right lung is clear.  IMPRESSION: Two leads cardiac pacemaker is unchanged in position. Apical epicardial leads are noted. There is small left pleural effusion with left basilar atelectasis. No pulmonary edema. Right lung is clear.   Electronically Signed  By: Lahoma Crocker M.D.  On: 12/20/2014 16:18   Impression:  She is  making a good recovery following her surgery and seems to be symptomatically improved early postop. I encouraged her to continue exercising to build up stamina. I asked her not to lift anything heavy for 8 weeks postop. I told her she could return to work on 01/01/2015.  Plan:  She will continue to follow up with Dr. Lovena Le.   Gaye Pollack, MD [facility] [phone]

## 2014-12-25 ENCOUNTER — Telehealth: Payer: Self-pay | Admitting: *Deleted

## 2014-12-25 NOTE — Telephone Encounter (Signed)
Left detailed message on personal voicemail informing patient that it would be fine to change to Dr. Caryl Comes in [place] (reviewed with Dr Lovena Le). Will have scheduler call patient to arrange OV.

## 2014-12-27 ENCOUNTER — Ambulatory Visit (INDEPENDENT_AMBULATORY_CARE_PROVIDER_SITE_OTHER): Payer: BLUE CROSS/BLUE SHIELD

## 2014-12-27 DIAGNOSIS — Z5181 Encounter for therapeutic drug level monitoring: Secondary | ICD-10-CM | POA: Diagnosis not present

## 2014-12-27 DIAGNOSIS — Z95810 Presence of automatic (implantable) cardiac defibrillator: Secondary | ICD-10-CM

## 2014-12-27 DIAGNOSIS — I2699 Other pulmonary embolism without acute cor pulmonale: Secondary | ICD-10-CM | POA: Diagnosis not present

## 2014-12-27 LAB — POCT INR: INR: 2.4

## 2014-12-28 ENCOUNTER — Other Ambulatory Visit: Payer: Self-pay

## 2014-12-28 DIAGNOSIS — J90 Pleural effusion, not elsewhere classified: Secondary | ICD-10-CM

## 2015-01-02 ENCOUNTER — Other Ambulatory Visit: Payer: Self-pay | Admitting: Internal Medicine

## 2015-01-03 ENCOUNTER — Telehealth: Payer: Self-pay | Admitting: Internal Medicine

## 2015-01-03 ENCOUNTER — Ambulatory Visit
Admission: RE | Admit: 2015-01-03 | Discharge: 2015-01-03 | Disposition: A | Discharge status: Home / Self Care | Payer: BLUE CROSS/BLUE SHIELD | Source: Ambulatory Visit | Attending: Surgery | Admitting: Surgery

## 2015-01-03 ENCOUNTER — Ambulatory Visit (INDEPENDENT_AMBULATORY_CARE_PROVIDER_SITE_OTHER): Payer: Self-pay | Admitting: Surgery

## 2015-01-03 ENCOUNTER — Ambulatory Visit (INDEPENDENT_AMBULATORY_CARE_PROVIDER_SITE_OTHER): Payer: BLUE CROSS/BLUE SHIELD | Admitting: *Deleted

## 2015-01-03 ENCOUNTER — Encounter: Payer: Self-pay | Admitting: Surgery

## 2015-01-03 VITALS — BP 121/83 | HR 87 | Resp 20 | Ht 65.5 in | Wt 170.0 lb

## 2015-01-03 DIAGNOSIS — Z95810 Presence of automatic (implantable) cardiac defibrillator: Secondary | ICD-10-CM

## 2015-01-03 DIAGNOSIS — I5020 Unspecified systolic (congestive) heart failure: Secondary | ICD-10-CM

## 2015-01-03 DIAGNOSIS — J90 Pleural effusion, not elsewhere classified: Secondary | ICD-10-CM

## 2015-01-03 DIAGNOSIS — Z5181 Encounter for therapeutic drug level monitoring: Secondary | ICD-10-CM | POA: Diagnosis not present

## 2015-01-03 DIAGNOSIS — I2699 Other pulmonary embolism without acute cor pulmonale: Secondary | ICD-10-CM

## 2015-01-03 LAB — POCT INR: INR: 4

## 2015-01-03 NOTE — Telephone Encounter (Signed)
New  Problem    Pt need information about filing disability. Please advise.

## 2015-01-03 NOTE — Telephone Encounter (Signed)
Spoke with patient and she is saying that she is having trouble getting up and getting going in the mornings.  She has just started back to work on [employer].  Doesn't know if she is going to be able to continue.  I have advised her to try on get continuous FMLA and if she wants to fill for disability she will need to call the local disability office and make an appointment to discuss what steps she needs to take next

## 2015-01-04 ENCOUNTER — Encounter: Payer: Self-pay | Admitting: Surgery

## 2015-01-05 ENCOUNTER — Encounter: Payer: Self-pay | Admitting: Surgery

## 2015-01-05 NOTE — Progress Notes (Signed)
HPI:  She returns for postoperative follow-up having undergone insertion of LV epicardial pacing leads via a left thoracotomy and revision of her BiV pacing system on 11/30/2014. The patient's early postoperative recovery while in the hospital was notable for an uncomplicated postop course.  She reports some redness along the generator pocket incision for the past few days that she thought may be related to pressure from her bra strap but it has not resolved. She has had no fever and no pain in the incision.    Current Outpatient Prescriptions  Medication Sig Dispense Refill  . albuterol (PROVENTIL HFA;VENTOLIN HFA) 108 (90 BASE) MCG/ACT inhaler Inhale 2 puffs into the lungs daily as needed for wheezing or shortness of breath.    . cyanocobalamin (,VITAMIN B-12,) 1000 MCG/ML injection Inject 1 mL (1,000 mcg total) into the muscle once a week. 10 mL 5  . cyclobenzaprine (FLEXERIL) 10 MG tablet Take 10 mg by mouth 3 (three) times daily as needed for muscle spasms.    . DULoxetine (CYMBALTA) 60 MG capsule TAKE (1) CAPSULE BY MOUTH EVERY DAY 30 capsule 3  . fexofenadine (ALLEGRA) 180 MG tablet Take 180 mg by mouth at bedtime.    . fluticasone (FLONASE) 50 MCG/ACT nasal spray Place 2 sprays into the nose at bedtime.    Marland Kitchen GILDESS FE 1/20 1-20 MG-MCG tablet TAKE (1) TABLET BY MOUTH EVERY DAY 1 Package 6  . losartan (COZAAR) 25 MG tablet TAKE (1) TABLET BY MOUTH EVERY DAY 30 tablet 3  . magnesium 30 MG tablet Take 30 mg by mouth daily.    . Melatonin 3 MG TABS Take 3 mg by mouth at bedtime as needed.     . montelukast (SINGULAIR) 10 MG tablet Take 1 tablet (10 mg total) by mouth at bedtime. 30 tablet 6  . NON FORMULARY Fiber Gummy Takes 1 gummy daily.    Marland Kitchen oxyCODONE (OXY IR/ROXICODONE) 5 MG immediate release tablet Take 1-2 tablets (5-10 mg total) by mouth every 6 (six) hours as needed for severe pain. 30 tablet 0  . spironolactone (ALDACTONE) 25 MG tablet Take 1 tablet (25 mg total) by mouth  daily. 30 tablet 3  . topiramate (TOPAMAX) 50 MG tablet Take 1 tablet (50 mg total) by mouth at bedtime. (Patient taking differently: Take 100 mg by mouth at bedtime. ) 30 tablet 1  . traMADol (ULTRAM) 50 MG tablet Take 1 tablet (50 mg total) by mouth every 6 (six) hours as needed. 40 tablet 0  . traZODone (DESYREL) 50 MG tablet TAKE 1/2 TO 1 TABLET AT BEDTIME AS NEEDED FOR SLEEP. 30 tablet 3  . valACYclovir (VALTREX) 1000 MG tablet Take 1 tablet (1,000 mg total) by mouth 2 (two) times daily. (Patient taking differently: Take 1,000 mg by mouth 2 (two) times daily as needed (fever blisters). ) 20 tablet 0  . warfarin (COUMADIN) 2.5 MG tablet Take 1 tablet (2.5 mg total) by mouth daily. Starting on 12/06/2014 then as directed by coumadin clinic 100 tablet 1   No current facility-administered medications for this visit.     Physical Exam: BP 121/83 mmHg  Pulse 87  Resp 20  Ht 5' 5.5" (1.664 m)  Wt 170 lb (77.111 kg)  BMI 27.85 kg/m2  SpO2 98% She looks well The generator incision is intact and healing well. There is no drainage. There is mild erythema along the incision. There is no tenderness and no fluid in the pocket.   The left thoracotomy incision is  healing well.   Diagnostic Tests:  CLINICAL DATA: Pleurisy. Effusion.  EXAM: CHEST 2 VIEW  COMPARISON: Comparison made to prior chest x-ray of 12/20/2014 and prior chest CT of 12/02/2014.  FINDINGS: Mediastinum and hilar structures are unremarkable. Heart size normal. Left-sided pacer noted lead tips in stable position. Stable left base subsegmental atelectasis and or mild infiltrate stable small left pleural effusion. No pneumothorax. Prior cervical spine fusion .  IMPRESSION: Stable left base subsegmental atelectasis and or mild infiltrate stable small left pleural effusion.   Electronically Signed  By: Marcello Moores Register  On: 01/03/2015 10:23   Impression:  There is mild erythema along the generator  incision that may be related to the subcuticular vicryl suture beginning to dissolve. I don't think there is infection of the pocket at this time. I did do a culture and gram stain of the pocket fluid at the time of surgery and these were negative. I would consider putting her on a prophylactic antibiotic but she is allergic to penicillins, possibly cephalosporins, and Levaquin. She says that she has had doxycycline in the past and tolerated it fairly well but would like to wait to see if this progresses before taking antibiotics. I think that is reasonable.  Plan:  I will see her back in one week for follow up. She will let me know immediately if the redness progresses.   Gaye Pollack, MD [facility] [phone]

## 2015-01-10 ENCOUNTER — Encounter: Payer: Self-pay | Admitting: Surgery

## 2015-01-10 ENCOUNTER — Ambulatory Visit (INDEPENDENT_AMBULATORY_CARE_PROVIDER_SITE_OTHER): Payer: Self-pay | Admitting: Surgery

## 2015-01-10 ENCOUNTER — Ambulatory Visit (INDEPENDENT_AMBULATORY_CARE_PROVIDER_SITE_OTHER): Payer: BLUE CROSS/BLUE SHIELD

## 2015-01-10 VITALS — BP 127/83 | HR 90 | Resp 20 | Ht 65.5 in | Wt 170.0 lb

## 2015-01-10 DIAGNOSIS — I2699 Other pulmonary embolism without acute cor pulmonale: Secondary | ICD-10-CM

## 2015-01-10 DIAGNOSIS — I5020 Unspecified systolic (congestive) heart failure: Secondary | ICD-10-CM

## 2015-01-10 DIAGNOSIS — Z5181 Encounter for therapeutic drug level monitoring: Secondary | ICD-10-CM

## 2015-01-10 DIAGNOSIS — Z95810 Presence of automatic (implantable) cardiac defibrillator: Secondary | ICD-10-CM | POA: Diagnosis not present

## 2015-01-10 LAB — POCT INR: INR: 2

## 2015-01-10 NOTE — Progress Notes (Signed)
HPI:  She returns today for examination of her BiV pacer incision s/p insertion of LV epicardial pacing leads via a left thoracotomy and revision of her BiV pacing system on 11/30/2014. She was seen last week due to some redness around the incision that I thought was most likely due to the subcuticular vicryl suture dissolving. She feels like the redness has improved. She has not had any fever or chills and no pain.  Current Outpatient Prescriptions  Medication Sig Dispense Refill  . albuterol (PROVENTIL HFA;VENTOLIN HFA) 108 (90 BASE) MCG/ACT inhaler Inhale 2 puffs into the lungs daily as needed for wheezing or shortness of breath.    . cyanocobalamin (,VITAMIN B-12,) 1000 MCG/ML injection Inject 1 mL (1,000 mcg total) into the muscle once a week. 10 mL 5  . cyclobenzaprine (FLEXERIL) 10 MG tablet Take 10 mg by mouth 3 (three) times daily as needed for muscle spasms.    . DULoxetine (CYMBALTA) 60 MG capsule TAKE (1) CAPSULE BY MOUTH EVERY DAY 30 capsule 3  . fexofenadine (ALLEGRA) 180 MG tablet Take 180 mg by mouth at bedtime.    . fluticasone (FLONASE) 50 MCG/ACT nasal spray Place 2 sprays into the nose at bedtime.    Marland Kitchen GILDESS FE 1/20 1-20 MG-MCG tablet TAKE (1) TABLET BY MOUTH EVERY DAY 1 Package 6  . losartan (COZAAR) 25 MG tablet TAKE (1) TABLET BY MOUTH EVERY DAY 30 tablet 3  . magnesium 30 MG tablet Take 30 mg by mouth daily.    . Melatonin 3 MG TABS Take 3 mg by mouth at bedtime as needed.     . montelukast (SINGULAIR) 10 MG tablet Take 1 tablet (10 mg total) by mouth at bedtime. 30 tablet 6  . NON FORMULARY Fiber Gummy Takes 1 gummy daily.    Marland Kitchen oxyCODONE (OXY IR/ROXICODONE) 5 MG immediate release tablet Take 1-2 tablets (5-10 mg total) by mouth every 6 (six) hours as needed for severe pain. 30 tablet 0  . spironolactone (ALDACTONE) 25 MG tablet Take 1 tablet (25 mg total) by mouth daily. 30 tablet 3  . topiramate (TOPAMAX) 50 MG tablet Take 1 tablet (50 mg total) by mouth at  bedtime. (Patient taking differently: Take 100 mg by mouth at bedtime. ) 30 tablet 1  . traMADol (ULTRAM) 50 MG tablet Take 1 tablet (50 mg total) by mouth every 6 (six) hours as needed. 40 tablet 0  . traZODone (DESYREL) 50 MG tablet TAKE 1/2 TO 1 TABLET AT BEDTIME AS NEEDED FOR SLEEP. 30 tablet 3  . valACYclovir (VALTREX) 1000 MG tablet Take 1 tablet (1,000 mg total) by mouth 2 (two) times daily. (Patient taking differently: Take 1,000 mg by mouth 2 (two) times daily as needed (fever blisters). ) 20 tablet 0  . warfarin (COUMADIN) 2.5 MG tablet Take 1 tablet (2.5 mg total) by mouth daily. Starting on 12/06/2014 then as directed by coumadin clinic 100 tablet 1   No current facility-administered medications for this visit.     Physical Exam: BP 127/83 mmHg  Pulse 90  Resp 20  Ht 5' 5.5" (1.664 m)  Wt 170 lb (77.111 kg)  BMI 27.85 kg/m2  SpO2 99% The pacer incision is intact with minimal residual redness and no drainage or swelling. It is non-tender. The left thoracotomy incision is healing well.   Impression:  I think the incision looks good and the small amount of redness is likely due to the subcuticular vicryl suture.   Plan:  She will  continue to observe this and will let me know if there is any worsening of the erythema, tenderness, swelling, drainage or fever.   Gaye Pollack, MD [facility] [phone]

## 2015-01-22 ENCOUNTER — Encounter: Payer: Self-pay | Admitting: Internal Medicine

## 2015-01-22 ENCOUNTER — Ambulatory Visit (INDEPENDENT_AMBULATORY_CARE_PROVIDER_SITE_OTHER): Payer: BLUE CROSS/BLUE SHIELD | Admitting: Internal Medicine

## 2015-01-22 VITALS — BP 148/82 | HR 95 | Temp 98.6°F | Resp 12 | Ht 65.5 in | Wt 168.8 lb

## 2015-01-22 DIAGNOSIS — R05 Cough: Secondary | ICD-10-CM

## 2015-01-22 DIAGNOSIS — R059 Cough, unspecified: Secondary | ICD-10-CM

## 2015-01-22 DIAGNOSIS — J0110 Acute frontal sinusitis, unspecified: Secondary | ICD-10-CM | POA: Diagnosis not present

## 2015-01-22 LAB — POCT INFLUENZA A/B
Influenza A, POC: NEGATIVE
Influenza B, POC: NEGATIVE

## 2015-01-22 MED ORDER — DOXYCYCLINE HYCLATE 100 MG PO CAPS: 100.0000 mg | ORAL_CAPSULE | Freq: Two times a day (BID) | ORAL | Status: DC

## 2015-01-22 MED ORDER — GUAIFENESIN-CODEINE 100-10 MG/5ML PO SYRP: 5.0000 mL | ORAL_SOLUTION | Freq: Three times a day (TID) | ORAL | Status: DC | PRN

## 2015-01-22 NOTE — Progress Notes (Signed)
Pre-visit discussion using our clinic review tool. No additional management support is needed unless otherwise documented below in the visit note.  

## 2015-01-22 NOTE — Patient Instructions (Addendum)
I am treating you for a sinus infection   With :   Doxycycline twice daily with food x 7 days  Take with food   Sudafed round the clock for the congestion,    Salt water sinus rinse once or twice daily  Probiotics for 3 weeks

## 2015-01-22 NOTE — Progress Notes (Signed)
Subjective:  Patient ID: [name], female    DOB: Apr 22, 1960  Age: 54 y.o. MRN: [id number]  CC: The primary encounter diagnosis was Cough. A diagnosis of Acute frontal sinusitis, recurrence not specified was also pertinent to this visit.  HPI SANVIKA FILIPPELLI presents for evaluation of dry hacking  cough , started 4 days ago.  Husband was sick for a few days prior with viral URI.      Some fever last night under 100.  Frontal and maxillary sinuses are tender,  Some blood streaked drainage,   Neck glands are swollen,.  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 LE dopplers were negative for DVT Oct 9 by vascular lab   Last INr Dec 05 2014 coumadin held,  Discharged on 2. 5 mg  Coumadin clinic  is managing     Outpatient Prescriptions Prior to Visit  Medication Sig Dispense Refill  . albuterol (PROVENTIL HFA;VENTOLIN HFA) 108 (90 BASE) MCG/ACT inhaler Inhale 2 puffs into the lungs daily as needed for wheezing or shortness of breath.    . cyanocobalamin (,VITAMIN B-12,) 1000 MCG/ML injection Inject 1 mL (1,000 mcg total) into the muscle once a week. 10 mL 5  . cyclobenzaprine (FLEXERIL) 10 MG tablet Take 10 mg by mouth 3 (three) times daily as needed for muscle spasms.    . DULoxetine (CYMBALTA) 60 MG capsule TAKE (1) CAPSULE BY MOUTH EVERY DAY 30 capsule 3  . fexofenadine (ALLEGRA) 180 MG tablet Take 180 mg by mouth at bedtime.    . fluticasone (FLONASE) 50 MCG/ACT nasal spray Place 2 sprays into the nose at bedtime.    Marland Kitchen GILDESS FE 1/20 1-20 MG-MCG tablet TAKE (1) TABLET BY MOUTH EVERY DAY 1 Package 6  . losartan (COZAAR) 25 MG tablet TAKE (1) TABLET BY MOUTH EVERY DAY 30 tablet 3  . magnesium 30 MG tablet Take 30 mg by mouth daily.    . Melatonin 3 MG TABS Take 3 mg by mouth at bedtime as needed.     . montelukast (SINGULAIR) 10  MG tablet Take 1 tablet (10 mg total) by mouth at bedtime. 30 tablet 6  . NON FORMULARY Fiber Gummy Takes 1 gummy daily.    Marland Kitchen oxyCODONE (OXY IR/ROXICODONE) 5 MG immediate release tablet Take 1-2 tablets (5-10 mg total) by mouth every 6 (six) hours as needed for severe pain. 30 tablet 0  . spironolactone (ALDACTONE) 25 MG tablet Take 1 tablet (25 mg total) by mouth daily. 30 tablet 3  . topiramate (TOPAMAX) 50 MG tablet Take 1 tablet (50 mg total) by mouth at bedtime. (Patient taking differently: Take 100 mg by mouth at bedtime. ) 30 tablet 1  . traMADol (ULTRAM) 50 MG tablet Take 1 tablet (50 mg total) by mouth every 6 (six) hours as needed. 40 tablet 0  . traZODone (DESYREL) 50 MG tablet TAKE 1/2 TO 1 TABLET AT BEDTIME AS NEEDED FOR SLEEP. 30 tablet 3  . valACYclovir (VALTREX) 1000 MG tablet Take 1 tablet (1,000 mg total) by mouth 2 (two) times daily. (Patient taking differently: Take 1,000 mg by mouth 2 (two) times daily as needed (fever blisters). ) 20 tablet 0  . warfarin (COUMADIN) 2.5 MG tablet Take 1 tablet (2.5 mg total) by mouth daily. Starting on 12/06/2014 then as directed by coumadin clinic 100 tablet 1  No facility-administered medications prior to visit.    Review of Systems;  Patient denies , unintentional weight loss, skin rash, eye pain,  dysphagia,  hemoptysis , , dyspnea, wheezing, chest pain, palpitations, orthopnea, edema, abdominal pain, nausea, melena, diarrhea, constipation, flank pain, dysuria, hematuria, urinary  Frequency, nocturia, numbness, tingling, seizures,  Focal weakness, Loss of consciousness,  Tremor, insomnia, depression, anxiety, and suicidal ideation.      Objective:  BP 148/82 mmHg  Pulse 95  Temp(Src) 98.6 F (37 C) (Oral)  Resp 12  Ht 5' 5.5" (1.664 m)  Wt 168 lb 12 oz (76.544 kg)  BMI 27.64 kg/m2  SpO2 100%  LMP 01/02/2015 (Approximate)  BP Readings from Last 3 Encounters:  01/22/15 148/82  01/10/15 127/83  01/03/15 121/83    Wt  Readings from Last 3 Encounters:  01/22/15 168 lb 12 oz (76.544 kg)  01/10/15 170 lb (77.111 kg)  01/03/15 170 lb (77.111 kg)    General appearance: alert, cooperative and appears stated age 60: Head: Normocephalic, without obvious abnormality, atraumatic, sinuses tender to percussion Eyes: conjunctivae/corneas clear. PERRL, EOM's intact. Fundi benign. Ears: erythematous TM's and external ear canals both ears Nose: Nares normal. Septum midline. Mucosa injected and red . Maxillary  sinus tenderness bilateral, no crusting or bleeding points Throat: lips, mucosa, and tongue normal; teeth and gums normal Back: symmetric, no curvature. ROM normal. No CVA tenderness. Lungs: clear to auscultation bilaterally Heart: regular rate and rhythm, S1, S2 normal, no murmur, click, rub or gallop Abdomen: soft, non-tender; bowel sounds normal; no masses,  no organomegaly Pulses: 2+ and symmetric Skin: Skin color, texture, turgor normal. No rashes or lesions Lymph nodes: Cervical, supraclavicular, and axillary nodes normal.  No results found for: HGBA1C  Lab Results  Component Value Date   CREATININE 1.09* 12/01/2014   CREATININE 1.21* 11/28/2014   CREATININE 1.18* 06/12/2014    Lab Results  Component Value Date   WBC 6.0 12/05/2014   HGB 11.7* 12/05/2014   HCT 35.7* 12/05/2014   PLT 216 12/05/2014   GLUCOSE 121* 12/01/2014   CHOL 246* 12/08/2013   TRIG 126.0 12/08/2013   HDL 39.50 12/08/2013   LDLDIRECT 151.6 05/01/2011   LDLCALC 181* 12/08/2013   ALT 12* 11/28/2014   AST 18 11/28/2014   NA 133* 12/01/2014   K 3.9 12/01/2014   CL 103 12/01/2014   CREATININE 1.09* 12/01/2014   BUN 8 12/01/2014   CO2 20* 12/01/2014   TSH 1.08 12/08/2013   INR 2.0 01/10/2015    Dg Chest 2 View  01/03/2015  CLINICAL DATA:  Pleurisy.  Effusion. EXAM: CHEST  2 VIEW COMPARISON:  Comparison made to prior chest x-ray of 12/20/2014 and prior chest CT of 12/02/2014. FINDINGS: Mediastinum and hilar  structures are unremarkable. Heart size normal. Left-sided pacer noted lead tips in stable position. Stable left base subsegmental atelectasis and or mild infiltrate stable small left pleural effusion. No pneumothorax. Prior cervical spine fusion . IMPRESSION: Stable left base subsegmental atelectasis and or mild infiltrate stable small left pleural effusion. Electronically Signed   By: Marcello Moores  Register   On: 01/03/2015 10:23    Assessment & Plan:   Problem List Items Addressed This Visit    RESOLVED: Cough - Primary   Relevant Orders   POCT Influenza A/B (Completed)   Sinusitis, acute frontal    Given chronicity of symptoms, development of facial pain and exam consistent with bacterial URI,  Will treat with empiric antibiotics, decongestants, and saline lavage.  Relevant Medications   guaiFENesin-codeine (CHERATUSSIN AC) 100-10 MG/5ML syrup   doxycycline (VIBRAMYCIN) 100 MG capsule      I am having Ms. Mazariego start on guaiFENesin-codeine and doxycycline. I am also having her maintain her fexofenadine, magnesium, montelukast, fluticasone, albuterol, topiramate, Melatonin, spironolactone, valACYclovir, cyanocobalamin, cyclobenzaprine, GILDESS FE 1/20, NON FORMULARY, DULoxetine, oxyCODONE, warfarin, traZODone, traMADol, and losartan.  Meds ordered this encounter  Medications  . DISCONTD: warfarin (COUMADIN) 2.5 MG tablet    Sig: Take 2.5 mg by mouth daily. Except Sunday and Wednesday 5 mg  . guaiFENesin-codeine (CHERATUSSIN AC) 100-10 MG/5ML syrup    Sig: Take 5 mLs by mouth 3 (three) times daily as needed for cough.    Dispense:  120 mL    Refill:  0  . doxycycline (VIBRAMYCIN) 100 MG capsule    Sig: Take 1 capsule (100 mg total) by mouth 2 (two) times daily.    Dispense:  14 capsule    Refill:  0    Medications Discontinued During This Encounter  Medication Reason  . warfarin (COUMADIN) 2.5 MG tablet     Follow-up: No Follow-up on file.   [name], MD

## 2015-01-23 DIAGNOSIS — J0110 Acute frontal sinusitis, unspecified: Secondary | ICD-10-CM | POA: Insufficient documentation

## 2015-01-23 NOTE — Assessment & Plan Note (Signed)
Given chronicity of symptoms, development of facial pain and exam consistent with bacterial URI,  Will treat with empiric antibiotics, decongestants, and saline lavage.   

## 2015-01-24 ENCOUNTER — Ambulatory Visit (INDEPENDENT_AMBULATORY_CARE_PROVIDER_SITE_OTHER): Payer: BLUE CROSS/BLUE SHIELD | Admitting: *Deleted

## 2015-01-24 DIAGNOSIS — Z95810 Presence of automatic (implantable) cardiac defibrillator: Secondary | ICD-10-CM | POA: Diagnosis not present

## 2015-01-24 DIAGNOSIS — Z5181 Encounter for therapeutic drug level monitoring: Secondary | ICD-10-CM

## 2015-01-24 DIAGNOSIS — I2699 Other pulmonary embolism without acute cor pulmonale: Secondary | ICD-10-CM

## 2015-01-24 LAB — POCT INR: INR: 2.2

## 2015-01-26 ENCOUNTER — Other Ambulatory Visit: Payer: Self-pay | Admitting: Internal Medicine

## 2015-01-29 ENCOUNTER — Ambulatory Visit (INDEPENDENT_AMBULATORY_CARE_PROVIDER_SITE_OTHER): Payer: BLUE CROSS/BLUE SHIELD

## 2015-01-29 DIAGNOSIS — I2699 Other pulmonary embolism without acute cor pulmonale: Secondary | ICD-10-CM

## 2015-01-29 DIAGNOSIS — Z5181 Encounter for therapeutic drug level monitoring: Secondary | ICD-10-CM

## 2015-01-29 DIAGNOSIS — Z95810 Presence of automatic (implantable) cardiac defibrillator: Secondary | ICD-10-CM | POA: Diagnosis not present

## 2015-01-29 LAB — POCT INR: INR: 2

## 2015-02-09 ENCOUNTER — Encounter: Payer: BLUE CROSS/BLUE SHIELD | Admitting: Internal Medicine

## 2015-02-14 ENCOUNTER — Ambulatory Visit (INDEPENDENT_AMBULATORY_CARE_PROVIDER_SITE_OTHER): Payer: BLUE CROSS/BLUE SHIELD

## 2015-02-14 DIAGNOSIS — Z95810 Presence of automatic (implantable) cardiac defibrillator: Secondary | ICD-10-CM

## 2015-02-14 DIAGNOSIS — Z5181 Encounter for therapeutic drug level monitoring: Secondary | ICD-10-CM | POA: Diagnosis not present

## 2015-02-14 DIAGNOSIS — I2699 Other pulmonary embolism without acute cor pulmonale: Secondary | ICD-10-CM | POA: Diagnosis not present

## 2015-02-14 LAB — POCT INR: INR: 2.5

## 2015-02-27 ENCOUNTER — Other Ambulatory Visit: Payer: Self-pay | Admitting: Internal Medicine

## 2015-02-28 ENCOUNTER — Encounter: Payer: Self-pay | Admitting: Internal Medicine

## 2015-03-07 ENCOUNTER — Ambulatory Visit (INDEPENDENT_AMBULATORY_CARE_PROVIDER_SITE_OTHER): Payer: BLUE CROSS/BLUE SHIELD

## 2015-03-07 DIAGNOSIS — I2699 Other pulmonary embolism without acute cor pulmonale: Secondary | ICD-10-CM

## 2015-03-07 DIAGNOSIS — Z5181 Encounter for therapeutic drug level monitoring: Secondary | ICD-10-CM

## 2015-03-07 DIAGNOSIS — Z95810 Presence of automatic (implantable) cardiac defibrillator: Secondary | ICD-10-CM

## 2015-03-07 LAB — POCT INR: INR: 2.2

## 2015-03-20 ENCOUNTER — Telehealth: Payer: Self-pay | Admitting: Internal Medicine

## 2015-03-20 NOTE — Telephone Encounter (Signed)
I called and spoke with the patient. I advised her that Dr. Caryl Comes can discuss stopping her warfarin at her visit with him next week. She has been seeing Dr. Lovena Le and is switching to Dr. Caryl Comes in [place]. She is agreeable.

## 2015-03-20 NOTE — Telephone Encounter (Signed)
New message  Pt c/o medication issue: 1. Name of Medication: Coumadin   4. What is your medication issue? Pt req a call back to discuss possibly coming off of the coumadin. She states that she has been on it for a while. Pleas call call

## 2015-03-27 ENCOUNTER — Ambulatory Visit (INDEPENDENT_AMBULATORY_CARE_PROVIDER_SITE_OTHER): Payer: BLUE CROSS/BLUE SHIELD | Admitting: Internal Medicine

## 2015-03-27 ENCOUNTER — Encounter: Payer: Self-pay | Admitting: Internal Medicine

## 2015-03-27 ENCOUNTER — Ambulatory Visit (INDEPENDENT_AMBULATORY_CARE_PROVIDER_SITE_OTHER): Payer: BLUE CROSS/BLUE SHIELD

## 2015-03-27 VITALS — BP 120/68 | HR 87 | Ht 65.5 in | Wt 170.0 lb

## 2015-03-27 DIAGNOSIS — I420 Dilated cardiomyopathy: Secondary | ICD-10-CM

## 2015-03-27 DIAGNOSIS — I429 Cardiomyopathy, unspecified: Secondary | ICD-10-CM

## 2015-03-27 DIAGNOSIS — Z95810 Presence of automatic (implantable) cardiac defibrillator: Secondary | ICD-10-CM

## 2015-03-27 DIAGNOSIS — Z5181 Encounter for therapeutic drug level monitoring: Secondary | ICD-10-CM | POA: Diagnosis not present

## 2015-03-27 DIAGNOSIS — I5022 Chronic systolic (congestive) heart failure: Secondary | ICD-10-CM

## 2015-03-27 DIAGNOSIS — I2699 Other pulmonary embolism without acute cor pulmonale: Secondary | ICD-10-CM

## 2015-03-27 DIAGNOSIS — I341 Nonrheumatic mitral (valve) prolapse: Secondary | ICD-10-CM | POA: Diagnosis not present

## 2015-03-27 DIAGNOSIS — I447 Left bundle-branch block, unspecified: Secondary | ICD-10-CM | POA: Diagnosis not present

## 2015-03-27 LAB — POCT INR: INR: 2.8

## 2015-03-27 NOTE — Patient Instructions (Signed)
Medication Instructions: 1) Stop coumadin (warfarin)  Labwork: - Your physician recommends that you return for lab work in: 2 weeks- BMP  Procedures/Testing: - none  Follow-Up: - Remote monitoring is used to monitor your Pacemaker of ICD from home. This monitoring reduces the number of office visits required to check your device to one time per year. It allows [name] to keep an eye on the functioning of your device to ensure it is working properly. You are scheduled for a device check from home on 06/26/15. You may send your transmission at any time that day. If you have a wireless device, the transmission will be sent automatically. After your physician reviews your transmission, you will receive a postcard with your next transmission date.  - Your physician wants you to follow-up in: 9 months with Dr. Caryl Comes. You will receive a reminder letter in the mail two months in advance. If you don't receive a letter, please call our office to schedule the follow-up appointment.  Any Additional Special Instructions Will Be Listed Below (If Applicable).     If you need a refill on your cardiac medications before your next appointment, please call your pharmacy.
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
## 2015-03-30 ENCOUNTER — Encounter: Payer: Self-pay | Admitting: Family Medicine

## 2015-03-30 ENCOUNTER — Ambulatory Visit (INDEPENDENT_AMBULATORY_CARE_PROVIDER_SITE_OTHER): Payer: BLUE CROSS/BLUE SHIELD | Admitting: Family Medicine

## 2015-03-30 VITALS — BP 122/68 | HR 95 | Temp 98.5°F | Ht 65.5 in | Wt 169.8 lb

## 2015-03-30 DIAGNOSIS — J069 Acute upper respiratory infection, unspecified: Secondary | ICD-10-CM | POA: Diagnosis not present

## 2015-03-30 DIAGNOSIS — B9789 Other viral agents as the cause of diseases classified elsewhere: Principal | ICD-10-CM

## 2015-03-30 NOTE — Patient Instructions (Signed)
This is viral in origin.  Continue supportive care at home.  Call if you worsen or fail to improve.  Take care  Dr. Lacinda Axon

## 2015-03-30 NOTE — Progress Notes (Signed)
   Subjective:  Patient ID: Suzanne Spears, female    DOB: 18-Apr-1960  Age: 55 y.o. MRN: [id number]  CC: R ear pain, glands swollen, Sore throat, Sinus pain/pressure  HPI:  55 year old female with a complicated PMH presents with the above complaints.  Patient states that she's been feeling poorly since yesterday. She has developed right ear pain, lymphadenopathy, sore throat, watery eyes, facial pain/sinus pressure. No exacerbating or relieving factors. She is concerned she may have a sinus infection. She also reports associated cough which is nonproductive.   Social Hx   Social History   Social History  . Marital Status: Married    Spouse Name: N/A  . Number of Children: N/A  . Years of Education: N/A   Social History Main Topics  . Smoking status: Never Smoker   . Smokeless tobacco: Never Used  . Alcohol Use: Yes     Comment: socially  . Drug Use: No  . Sexual Activity: Yes   Other Topics Concern  . None   Social History Narrative   Review of Systems  Constitutional: Negative for fever.  HENT: Positive for congestion, ear pain, sinus pressure and sore throat.   Respiratory: Positive for cough.    Objective:  BP 122/68 mmHg  Pulse 95  Temp(Src) 98.5 F (36.9 C) (Oral)  Ht 5' 5.5" (1.664 m)  Wt 169 lb 12 oz (76.998 kg)  BMI 27.81 kg/m2  SpO2 98%  BP/Weight 03/30/2015 03/27/2015 [id number]  Systolic BP 123XX123 [id number] [id number]  Diastolic BP 68 68 82  Wt. (Lbs) 169.75 170 168.75  BMI 27.81 27.85 27.64   Physical Exam  Constitutional: She is oriented to person, place, and time. She appears well-developed. No distress.  HENT:  Head: Normocephalic and atraumatic.  Mouth/Throat: Oropharynx is clear and moist.  Right TM with effusion. Left TM normal.   Eyes: Conjunctivae are normal. Right eye exhibits no discharge. Left eye exhibits no discharge.  Neck: Neck supple.  Cardiovascular: Normal rate and regular rhythm.   2/6 systolic murmur.  Pulmonary/Chest: Effort normal and  breath sounds normal. No respiratory distress. She has no wheezes. She has no rales.  Lymphadenopathy:    She has cervical adenopathy.  Neurological: She is alert and oriented to person, place, and time.  Vitals reviewed.  Lab Results  Component Value Date   WBC 6.0 12/05/2014   HGB 11.7* 12/05/2014   HCT 35.7* 12/05/2014   PLT 216 12/05/2014   GLUCOSE 121* 12/01/2014   CHOL 246* 12/08/2013   TRIG 126.0 12/08/2013   HDL 39.50 12/08/2013   LDLDIRECT 151.6 05/01/2011   LDLCALC 181* 12/08/2013   ALT 12* 11/28/2014   AST 18 11/28/2014   NA 133* 12/01/2014   K 3.9 12/01/2014   CL 103 12/01/2014   CREATININE 1.09* 12/01/2014   BUN 8 12/01/2014   CO2 20* 12/01/2014   TSH 1.08 12/08/2013   INR 2.8 03/27/2015    Assessment & Plan:   Problem List Items Addressed This Visit    Viral URI with cough - Primary    New problem. Exam unremarkable. No evidence of bacterial etiology at this time. Advised supportive care.         Follow-up: PRN  [place]

## 2015-03-30 NOTE — Progress Notes (Signed)
Pre visit review using our clinic review tool, if applicable. No additional management support is needed unless otherwise documented below in the visit note. 

## 2015-03-30 NOTE — Assessment & Plan Note (Signed)
New problem. Exam unremarkable. No evidence of bacterial etiology at this time. Advised supportive care.

## 2015-03-31 LAB — CUP PACEART INCLINIC DEVICE CHECK
Brady Statistic AP VP Percent: 0.1 %
Brady Statistic AP VS Percent: 0.1 %
Brady Statistic AS VP Percent: 98.6 %
Brady Statistic AS VS Percent: 1.3 %
Date Time Interrogation Session: 20170204095628
Implantable Lead Implant Date: 20160421
Implantable Lead Implant Date: 20160421
Implantable Lead Implant Date: 20161006
Implantable Lead Implant Date: 20161006
Implantable Lead Location: 753858
Implantable Lead Location: 753858
Implantable Lead Location: 753859
Implantable Lead Location: 753860
Implantable Lead Model: 5071
Implantable Lead Model: 5071
Implantable Lead Model: 5076
Lead Channel Impedance Value: 323 Ohm
Lead Channel Impedance Value: 437 Ohm
Lead Channel Impedance Value: 437 Ohm
Lead Channel Pacing Threshold Amplitude: 0.75 V
Lead Channel Pacing Threshold Amplitude: 0.75 V
Lead Channel Pacing Threshold Amplitude: 1.5 V
Lead Channel Pacing Threshold Pulse Width: 0.4 ms
Lead Channel Pacing Threshold Pulse Width: 0.4 ms
Lead Channel Pacing Threshold Pulse Width: 0.8 ms
Lead Channel Sensing Intrinsic Amplitude: 20 mV
Lead Channel Sensing Intrinsic Amplitude: 3.3 mV
Lead Channel Setting Pacing Amplitude: 2 V
Lead Channel Setting Pacing Amplitude: 2.5 V
Lead Channel Setting Pacing Pulse Width: 0.4 ms
Lead Channel Setting Sensing Sensitivity: 0.3 mV

## 2015-04-02 ENCOUNTER — Telehealth: Payer: Self-pay | Admitting: Internal Medicine

## 2015-04-02 NOTE — Telephone Encounter (Signed)
I spoke with the patient on the phone.  She was advised by [facility] to go to ED, she refused.  She is in the waiting room at [facility] urgent care currently waiting to be seen.  I advised her that we could have seen her with another provider at 1130am, or with Dr. Derrel Nip after 5pm she stated that [facility] gave no option but the ED and she wasn't going to go there so she went to the [facility].   I asked about her symptoms; heavily congested, green mucous, SOB due to coughing. No fever noted.  Feels like her HR is up, but believes it is because of her coughing and not feeling well.  She will call the office if she needs follow up or anything further.

## 2015-04-02 NOTE — Telephone Encounter (Signed)
[place]  Patient Name: Suzanne Spears  DOB: [birth year]    Initial Comment Caller states c/o shortness of breath, cough, green mucus    Nurse Assessment  Nurse: Orvan Seen, RN, Jacquilin Date/Time (Eastern Time): 04/02/2015 8:24:12 AM  Confirm and document reason for call. If symptomatic, describe symptoms. You must click the next button to save text entered. ---Caller states c/o shortness of breath, cough, green mucus  Has the patient traveled out of the country within the last 30 days? ---No  Does the patient have any new or worsening symptoms? ---Yes  Will a triage be completed? ---Yes  Related visit to physician within the last 2 weeks? ---Yes  Does the PT have any chronic conditions? (i.e. diabetes, asthma, etc.) ---Yes  List chronic conditions. ---asthma, CHF, blood clots, HTN, GERD, Depression, Anemia.  Did the patient indicate they were pregnant? ---No  Is this a behavioral health or substance abuse call? ---No     Guidelines    Guideline Title Affirmed Question Affirmed Notes  Breathing Difficulty History of prior "blood clot" in leg or lungs (i.e., deep vein thrombosis, pulmonary embolism)    Final Disposition User   Go to ED Now [place], RN, Jacquilin    Referrals  GO TO FACILITY REFUSED   Caller states she refuses to go to the closest ER but will try to get a way to go to the [facility] ER

## 2015-04-02 NOTE — Telephone Encounter (Signed)
I believe this was sent to our office by mistake, this pt is not a pt at this office.

## 2015-04-03 ENCOUNTER — Other Ambulatory Visit: Payer: Self-pay | Admitting: Internal Medicine

## 2015-04-09 ENCOUNTER — Ambulatory Visit (INDEPENDENT_AMBULATORY_CARE_PROVIDER_SITE_OTHER): Payer: BLUE CROSS/BLUE SHIELD | Admitting: Internal Medicine

## 2015-04-09 ENCOUNTER — Encounter: Payer: Self-pay | Admitting: Internal Medicine

## 2015-04-09 VITALS — BP 136/88 | HR 87 | Temp 97.8°F | Resp 15 | Ht 65.5 in | Wt 173.6 lb

## 2015-04-09 DIAGNOSIS — J0110 Acute frontal sinusitis, unspecified: Secondary | ICD-10-CM | POA: Diagnosis not present

## 2015-04-09 DIAGNOSIS — J4520 Mild intermittent asthma, uncomplicated: Secondary | ICD-10-CM | POA: Diagnosis not present

## 2015-04-09 MED ORDER — HYDROCOD POLST-CPM POLST ER 10-8 MG/5ML PO SUER: 5.0000 mL | Freq: Every evening | ORAL | Status: DC | PRN

## 2015-04-09 MED ORDER — CLINDAMYCIN HCL 300 MG PO CAPS: 300.0000 mg | ORAL_CAPSULE | Freq: Three times a day (TID) | ORAL | Status: DC

## 2015-04-09 NOTE — Patient Instructions (Signed)
Continue doxycycline and adding  clindamcyin (antibiotic) for 5 more days to treat sinusitis  Tussionex for nighttime cough  Continue Robitussin for daytime cough

## 2015-04-09 NOTE — Progress Notes (Signed)
Subjective:  Patient ID: Suzanne Spears, female    DOB: 07/31/1960  Age: 55 y.o. MRN: [id number]  CC: The primary encounter diagnosis was Asthma, chronic, mild intermittent, uncomplicated. A diagnosis of Acute frontal sinusitis, recurrence not specified was also pertinent to this visit.  HPI Suzanne Spears presents for follow up on bronchitis,  She was evaluated by Dr Lacinda Axon on Feb 3 and diagnosed with viral uri with cough despite having a right sided otitis with effusion.  She reports today that at the time she had right sided facial pain x 2-3 days,  Accompanied by  by cough.    [facility] Monday when her symptoms progressed over the weekend and she was not offered an appointment with me or with Dr Lacinda Axon on  Monday.  The triage line handled her call and advised her only to go to the ER because she reported dyspnea.   She was treated for bronchitis with doxycycline  and cough medication . She reports no imrovement. .  Feels she is wheezing more.  Cough is productive of yellow sputum.  NO fevers,  Chest feels heavy,   No improvement with inhaler,  Ears felt stopped up but feel better now,  .but she reports pain under left breast with cough.  And still having purulent sinus drainage ,  Blood streaked, and frontal sinus pain .   2) History of asthma.  She is requesting referral to local pulmonology in the event that she is hospitalized at [facility]     Outpatient Prescriptions Prior to Visit  Medication Sig Dispense Refill  . albuterol (PROVENTIL HFA;VENTOLIN HFA) 108 (90 BASE) MCG/ACT inhaler Inhale 2 puffs into the lungs daily as needed for wheezing or shortness of breath.    . cyanocobalamin (,VITAMIN B-12,) 1000 MCG/ML injection INJECT 1 ML INTO THE MUSCLE ONCE A WEEK 10 mL 5  . cyclobenzaprine (FLEXERIL) 10 MG tablet Take 10 mg by mouth 3 (three) times daily as needed for muscle spasms.    . DULoxetine (CYMBALTA) 60 MG capsule TAKE (1) CAPSULE BY MOUTH  EVERY DAY 30 capsule 3  . fexofenadine (ALLEGRA) 180 MG tablet Take 180 mg by mouth at bedtime.    . fluticasone (FLONASE) 50 MCG/ACT nasal spray Place 2 sprays into the nose at bedtime.    Marland Kitchen GILDESS FE 1/20 1-20 MG-MCG tablet TAKE (1) TABLET BY MOUTH EVERY DAY 1 Package 6  . losartan (COZAAR) 25 MG tablet TAKE (1) TABLET BY MOUTH EVERY DAY 30 tablet 3  . losartan (COZAAR) 25 MG tablet TAKE (1) TABLET BY MOUTH EVERY DAY 30 tablet 3  . magnesium 30 MG tablet Take 30 mg by mouth daily.    . Melatonin 3 MG TABS Take 3 mg by mouth at bedtime as needed.     . montelukast (SINGULAIR) 10 MG tablet Take 1 tablet (10 mg total) by mouth at bedtime. 30 tablet 6  . NON FORMULARY Fiber Gummy Takes 1 gummy daily.    Marland Kitchen oxyCODONE (OXY IR/ROXICODONE) 5 MG immediate release tablet Take 1-2 tablets (5-10 mg total) by mouth every 6 (six) hours as needed for severe pain. 30 tablet 0  . spironolactone (ALDACTONE) 25 MG tablet Take 1 tablet (25 mg total) by mouth daily. 30 tablet 3  . topiramate (TOPAMAX) 50 MG tablet Take 1 tablet (50 mg total) by mouth at bedtime. (Patient taking differently: Take 100 mg by mouth at bedtime. ) 30 tablet 1  .  traMADol (ULTRAM) 50 MG tablet Take 1 tablet (50 mg total) by mouth every 6 (six) hours as needed. 40 tablet 0  . traZODone (DESYREL) 50 MG tablet TAKE 1/2-1 TABLETS BY MOUTH AT BEDTIME AS NEEDED FOR SLEEP 30 tablet 3  . valACYclovir (VALTREX) 1000 MG tablet Take 1 tablet (1,000 mg total) by mouth 2 (two) times daily. (Patient taking differently: Take 1,000 mg by mouth 2 (two) times daily as needed (fever blisters). ) 20 tablet 0   No facility-administered medications prior to visit.    Review of Systems;  Patient denies headache, fevers, malaise, unintentional weight loss, skin rash, eye pain, sinus congestion and sinus pain, sore throat, dysphagia,  hemoptysis , cough, dyspnea, wheezing, chest pain, palpitations, orthopnea, edema, abdominal pain, nausea, melena, diarrhea,  constipation, flank pain, dysuria, hematuria, urinary  Frequency, nocturia, numbness, tingling, seizures,  Focal weakness, Loss of consciousness,  Tremor, insomnia, depression, anxiety, and suicidal ideation.      Objective:  BP 136/88 mmHg  Pulse 87  Temp(Src) 97.8 F (36.6 C) (Oral)  Resp 15  Ht 5' 5.5" (1.664 m)  Wt 173 lb 9.6 oz (78.744 kg)  BMI 28.44 kg/m2  SpO2 98%  BP Readings from Last 3 Encounters:  04/09/15 136/88  03/30/15 122/68  03/27/15 120/68    Wt Readings from Last 3 Encounters:  04/09/15 173 lb 9.6 oz (78.744 kg)  03/30/15 169 lb 12 oz (76.998 kg)  03/27/15 170 lb (77.111 kg)    General appearance: alert, cooperative and appears stated age Head: bilateral frontal sinus pain with palpation Ears: left ear effusion,  Right looks ok   Throat: lips, mucosa, and tongue normal; teeth and gums normal Neck: no adenopathy, no carotid bruit, supple, symmetrical, trachea midline and thyroid not enlarged, symmetric, no tenderness/mass/nodules Back: symmetric, no curvature. ROM normal. No CVA tenderness. Lungs: clear to auscultation bilaterally Heart: regular rate and rhythm, S1, S2 normal, no murmur, click, rub or gallop Abdomen: soft, non-tender; bowel sounds normal; no masses,  no organomegaly Pulses: 2+ and symmetric Skin: Skin color, texture, turgor normal. No rashes or lesions Lymph nodes: Cervical, supraclavicular, and axillary nodes normal.  No results found for: HGBA1C  Lab Results  Component Value Date   CREATININE 1.09* 12/01/2014   CREATININE 1.21* 11/28/2014   CREATININE 1.18* 06/12/2014    Lab Results  Component Value Date   WBC 6.0 12/05/2014   HGB 11.7* 12/05/2014   HCT 35.7* 12/05/2014   PLT 216 12/05/2014   GLUCOSE 121* 12/01/2014   CHOL 246* 12/08/2013   TRIG 126.0 12/08/2013   HDL 39.50 12/08/2013   LDLDIRECT 151.6 05/01/2011   LDLCALC 181* 12/08/2013   ALT 12* 11/28/2014   AST 18 11/28/2014   NA 133* 12/01/2014   K 3.9  12/01/2014   CL 103 12/01/2014   CREATININE 1.09* 12/01/2014   BUN 8 12/01/2014   CO2 20* 12/01/2014   TSH 1.08 12/08/2013   INR 2.8 03/27/2015    Dg Chest 2 View  01/03/2015  CLINICAL DATA:  Pleurisy.  Effusion. EXAM: CHEST  2 VIEW COMPARISON:  Comparison made to prior chest x-ray of 12/20/2014 and prior chest CT of 12/02/2014. FINDINGS: Mediastinum and hilar structures are unremarkable. Heart size normal. Left-sided pacer noted lead tips in stable position. Stable left base subsegmental atelectasis and or mild infiltrate stable small left pleural effusion. No pneumothorax. Prior cervical spine fusion . IMPRESSION: Stable left base subsegmental atelectasis and or mild infiltrate stable small left pleural effusion. Electronically Signed  By: [place]   On: 01/03/2015 10:23    Assessment & Plan:   Problem List Items Addressed This Visit    Asthma, chronic - Primary    Referral to Philippa Chester, MD in [facility] for follow up       Relevant Orders   Ambulatory referral to Pulmonology   Sinusitis, acute frontal    Adding clindamycin to doxycycline given exam concerning for  Bacterial sinusitis with left effusion.  Multiple  antibiotic intolerances. She  has declined prednisone taper .  Refilling tussionex for cough control.  Continue probiotic       Relevant Medications   chlorpheniramine-HYDROcodone (TUSSIONEX PENNKINETIC ER) 10-8 MG/5ML SUER   clindamycin (CLEOCIN) 300 MG capsule      I am having Suzanne Spears start on chlorpheniramine-HYDROcodone and clindamycin. I am also having her maintain her fexofenadine, magnesium, montelukast, fluticasone, albuterol, topiramate, Melatonin, spironolactone, valACYclovir, cyclobenzaprine, GILDESS FE 1/20, NON FORMULARY, oxyCODONE, traMADol, losartan, losartan, cyanocobalamin, DULoxetine, and traZODone.  Meds ordered this encounter  Medications  . chlorpheniramine-HYDROcodone (TUSSIONEX PENNKINETIC ER) 10-8 MG/5ML SUER    Sig: Take 5 mLs  by mouth at bedtime as needed for cough.    Dispense:  140 mL    Refill:  0  . clindamycin (CLEOCIN) 300 MG capsule    Sig: Take 1 capsule (300 mg total) by mouth 3 (three) times daily.    Dispense:  15 capsule    Refill:  0    There are no discontinued medications.  Follow-up: No Follow-up on file.   [name], MD

## 2015-04-09 NOTE — Progress Notes (Signed)
Pre visit review using our clinic review tool, if applicable. No additional management support is needed unless otherwise documented below in the visit note. 

## 2015-04-10 DIAGNOSIS — J0110 Acute frontal sinusitis, unspecified: Secondary | ICD-10-CM | POA: Insufficient documentation

## 2015-04-10 NOTE — Assessment & Plan Note (Addendum)
Adding clindamycin to doxycycline given exam concerning for  Bacterial sinusitis with left effusion.  Multiple  antibiotic intolerances. She  has declined prednisone taper .  Refilling tussionex for cough control.  Continue probiotic

## 2015-04-10 NOTE — Assessment & Plan Note (Signed)
Referral to Philippa Chester, MD in [place] for follow up

## 2015-04-11 ENCOUNTER — Other Ambulatory Visit: Payer: BLUE CROSS/BLUE SHIELD

## 2015-04-12 ENCOUNTER — Encounter: Payer: Self-pay | Admitting: Internal Medicine

## 2015-04-25 ENCOUNTER — Other Ambulatory Visit: Payer: Self-pay | Admitting: Internal Medicine

## 2015-04-26 ENCOUNTER — Other Ambulatory Visit: Payer: Self-pay | Admitting: Internal Medicine

## 2015-05-10 ENCOUNTER — Encounter: Payer: Self-pay | Admitting: Internal Medicine

## 2015-05-15 ENCOUNTER — Encounter: Payer: Self-pay | Admitting: Internal Medicine

## 2015-05-15 ENCOUNTER — Ambulatory Visit (INDEPENDENT_AMBULATORY_CARE_PROVIDER_SITE_OTHER): Payer: BLUE CROSS/BLUE SHIELD | Admitting: Internal Medicine

## 2015-05-15 VITALS — BP 122/78 | HR 96 | Ht 65.5 in | Wt 172.8 lb

## 2015-05-15 DIAGNOSIS — J4541 Moderate persistent asthma with (acute) exacerbation: Secondary | ICD-10-CM | POA: Diagnosis not present

## 2015-05-15 DIAGNOSIS — R05 Cough: Secondary | ICD-10-CM | POA: Diagnosis not present

## 2015-05-15 DIAGNOSIS — R059 Cough, unspecified: Secondary | ICD-10-CM | POA: Insufficient documentation

## 2015-05-15 MED ORDER — FLUTICASONE FUROATE 100 MCG/ACT IN AEPB: 1.0000 | INHALATION_SPRAY | Freq: Every day | RESPIRATORY_TRACT | Status: DC

## 2015-05-15 MED ORDER — FLUTICASONE FUROATE 100 MCG/ACT IN AEPB: 1.0000 | INHALATION_SPRAY | Freq: Every day | RESPIRATORY_TRACT | Status: AC

## 2015-05-15 NOTE — Assessment & Plan Note (Signed)
Previously seen by [facility] pulmonary, Dr. Lake Bells, and most recently seen by [facility] pulmonary Dr. Vella Kohler. She has transition care back to [facility]. Her asthma is mild intermittent, it seems this is more seasonal and may have a work-related component to it. I'm not totally convinced that she needs to be on a year-round maintenance inhaler. But she does seem to have triggers especially in the winter and early spring season, maybe during these time she should be on a maintenance inhaler. The above was discussed with the patient, and she is in agreement with. Off note, she claims that she has allergies to prednisone, but her descriptions are more in line with side effects of high-dose steroids.  I have discussed with her in the future if she does need prednisone, we can start off at lower dose, she will consider this when that time comes. We plan for full pulmonary function testing, 6 minute walk test, and maintenance inhaler during fall winter and early spring. Plan: -Arunity 123mcg - 1 puff daily, -gargle and rinse after each use.  -Pulmonary function testing - 6 Minute walk test -Avoid noxious substances such as smoke, perfumes, and other sick contacts. -Continue with outpatient allergy regiment.
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
## 2015-05-15 NOTE — Patient Instructions (Signed)
Follow up with Dr. Stevenson Clinch in:2 months - pulmonary function testing and 6 minute walk test prior to follow up - Arnuity 110mcg - 1 puff daily, gargle and rinse after each use.  - albuterol inhaler - 2puff every 3-4 hours as needed for shortness of breath\wheezing\recurrent cough - cont with current allergy regiment

## 2015-05-15 NOTE — Assessment & Plan Note (Signed)
Multifactorial: Postinfectious, CHF, asthma, recent infection.  I believe the cough the patient is having his postinfectious cough, in addition to allergies and recent URI. Her cough is getting better as each week goes on, fitting a diagnosis of a postinfectious cough.  Plan: -Supportive care -Arunity 182mcg daily

## 2015-05-25 ENCOUNTER — Other Ambulatory Visit: Payer: Self-pay | Admitting: Internal Medicine

## 2015-05-31 ENCOUNTER — Ambulatory Visit: Payer: Self-pay

## 2015-05-31 DIAGNOSIS — Z5181 Encounter for therapeutic drug level monitoring: Secondary | ICD-10-CM

## 2015-05-31 DIAGNOSIS — I2699 Other pulmonary embolism without acute cor pulmonale: Secondary | ICD-10-CM

## 2015-05-31 DIAGNOSIS — Z95810 Presence of automatic (implantable) cardiac defibrillator: Secondary | ICD-10-CM

## 2015-06-26 ENCOUNTER — Ambulatory Visit (INDEPENDENT_AMBULATORY_CARE_PROVIDER_SITE_OTHER): Payer: BLUE CROSS/BLUE SHIELD | Admitting: *Deleted

## 2015-06-26 ENCOUNTER — Telehealth: Payer: Self-pay | Admitting: Cardiology

## 2015-06-26 ENCOUNTER — Telehealth: Payer: Self-pay | Admitting: Internal Medicine

## 2015-06-26 DIAGNOSIS — I428 Other cardiomyopathies: Secondary | ICD-10-CM

## 2015-06-26 DIAGNOSIS — Z113 Encounter for screening for infections with a predominantly sexual mode of transmission: Secondary | ICD-10-CM

## 2015-06-26 DIAGNOSIS — Z7901 Long term (current) use of anticoagulants: Secondary | ICD-10-CM

## 2015-06-26 DIAGNOSIS — I5022 Chronic systolic (congestive) heart failure: Secondary | ICD-10-CM | POA: Diagnosis not present

## 2015-06-26 DIAGNOSIS — I429 Cardiomyopathy, unspecified: Secondary | ICD-10-CM

## 2015-06-26 DIAGNOSIS — E785 Hyperlipidemia, unspecified: Secondary | ICD-10-CM

## 2015-06-26 DIAGNOSIS — R5383 Other fatigue: Secondary | ICD-10-CM

## 2015-06-26 NOTE — Telephone Encounter (Signed)
Has a OV scheduled for June 2nd, wants labs I see a future for BMP, is there anythings else you need? Thanks

## 2015-06-26 NOTE — Telephone Encounter (Signed)
Pt called to sch her CPE appt and wanted to get labs done before her appt. Need orders please and thank you! Call pt @ [phone].

## 2015-06-26 NOTE — Telephone Encounter (Signed)
Spoke with pt and reminded pt of remote transmission that is due today. Pt verbalized understanding.   

## 2015-06-28 NOTE — Telephone Encounter (Signed)
Patient scheduled for lab 

## 2015-06-28 NOTE — Telephone Encounter (Signed)
Ok. It's sch Kathy sch appt.

## 2015-06-28 NOTE — Telephone Encounter (Signed)
Please schedule, orders in.

## 2015-06-28 NOTE — Progress Notes (Signed)
Remote ICD transmission.   

## 2015-06-28 NOTE — Telephone Encounter (Signed)
Fasting lipids, etc ordered.

## 2015-07-09 ENCOUNTER — Ambulatory Visit (INDEPENDENT_AMBULATORY_CARE_PROVIDER_SITE_OTHER): Payer: BLUE CROSS/BLUE SHIELD | Admitting: *Deleted

## 2015-07-09 ENCOUNTER — Other Ambulatory Visit: Payer: Self-pay | Admitting: Internal Medicine

## 2015-07-09 DIAGNOSIS — J4541 Moderate persistent asthma with (acute) exacerbation: Secondary | ICD-10-CM | POA: Diagnosis not present

## 2015-07-09 DIAGNOSIS — R0600 Dyspnea, unspecified: Secondary | ICD-10-CM

## 2015-07-09 LAB — PULMONARY FUNCTION TEST
DL/VA % pred: 68 %
DL/VA: 3.43 ml/min/mmHg/L
DLCO unc % pred: 91 %
DLCO unc: 23.95 ml/min/mmHg
FEF 25-75 Post: 2.01 L/sec
FEF 25-75 Pre: 1.69 L/sec
FEF2575-%Change-Post: 19 %
FEF2575-%Pred-Post: 75 %
FEF2575-%Pred-Pre: 62 %
FEV1-%Change-Post: 5 %
FEV1-%Pred-Post: 85 %
FEV1-%Pred-Pre: 80 %
FEV1-Post: 2.44 L
FEV1-Pre: 2.31 L
FEV1FVC-%Change-Post: 4 %
FEV1FVC-%Pred-Pre: 91 %
FEV6-%Change-Post: 1 %
FEV6-%Pred-Post: 92 %
FEV6-%Pred-Pre: 90 %
FEV6-Post: 3.25 L
FEV6-Pre: 3.19 L
FEV6FVC-%Pred-Post: 103 %
FEV6FVC-%Pred-Pre: 103 %
FVC-%Change-Post: 1 %
FVC-%Pred-Post: 89 %
FVC-%Pred-Pre: 87 %
FVC-Post: 3.25 L
FVC-Pre: 3.19 L
Post FEV1/FVC ratio: 75 %
Post FEV6/FVC ratio: 100 %
Pre FEV1/FVC ratio: 72 %
Pre FEV6/FVC Ratio: 100 %
RV % pred: 118 %
RV: 2.29 L
TLC % pred: 108 %
TLC: 5.74 L

## 2015-07-09 NOTE — Progress Notes (Signed)
PFT performed today. 

## 2015-07-09 NOTE — Progress Notes (Signed)
SMW performed today. 

## 2015-07-12 ENCOUNTER — Ambulatory Visit (INDEPENDENT_AMBULATORY_CARE_PROVIDER_SITE_OTHER): Payer: BLUE CROSS/BLUE SHIELD | Admitting: Internal Medicine

## 2015-07-12 ENCOUNTER — Encounter: Payer: Self-pay | Admitting: Internal Medicine

## 2015-07-12 VITALS — BP 118/72 | HR 90 | Ht 65.5 in | Wt 171.0 lb

## 2015-07-12 DIAGNOSIS — J4531 Mild persistent asthma with (acute) exacerbation: Secondary | ICD-10-CM

## 2015-07-12 DIAGNOSIS — R059 Cough, unspecified: Secondary | ICD-10-CM

## 2015-07-12 DIAGNOSIS — R05 Cough: Secondary | ICD-10-CM

## 2015-07-12 NOTE — Patient Instructions (Signed)
Follow up with Dr. Stevenson Clinch in:4 months - cont with Arnuity, gargle and rinse after each use - cont with allergy control and avoidance.

## 2015-07-12 NOTE — Assessment & Plan Note (Signed)
Significantly improved since starting maintenance inhaler. Cont Arnuity  Plan: -Supportive care -Arunity 162mcg daily

## 2015-07-12 NOTE — Assessment & Plan Note (Signed)
Previously seen by [facility] pulmonary, Dr. Lake Bells, and most recently seen by [facility] pulmonary Dr. Vella Kohler. She has transition care back to [facility] pulmonary. Her asthma is mild intermittent, it seems this is more seasonal and may have a work-related component to it. I'm not totally convinced that she needs to be on a year-round maintenance inhaler. But she does seem to have triggers especially in the winter and early spring season, maybe during these time she should be on a maintenance inhaler. The above was discussed with the patient, and she is in agreement with. She is currently on Arnuity with great improvement, therefore we will continue with this.  Pfts are essentially normal, with moderate decrease in FEF 25-75 62% (FEV1 80%, FEV1/FVC 72%) Normal 58mwt - no desats, walked 1146ft/360m   Off note, she claims that she has allergies to prednisone, but her descriptions are more in line with side effects of high-dose steroids.  I have discussed with her in the future if she does need prednisone, we can start off at lower dose, she will consider this when that time comes.  We plan for full pulmonary function testing, 6 minute walk test, and maintenance inhaler during fall winter and early spring.  Plan: -Arunity 12mcg - 1 puff daily, -gargle and rinse after each use.  -Avoid noxious substances such as smoke, perfumes, and other sick contacts. -Continue with outpatient allergy regiment.

## 2015-07-12 NOTE — Progress Notes (Signed)
[facility] Consultation      MRN# [id number] Suzanne Spears [birth year]   CC: Chief Complaint  Patient presents with  . Follow-up    PFT & SMW results; dry cough; SOB w/excertion; doing well on Arnuity      Brief History: 55 year old female seen in consultation as transition of care from Dr. Raul Del office for mild to moderate asthma, previously saw [facility], then transferred to [facility], now back to [facility]. Medical history significant for left bundle branch block, mitral prolapse, dilated cardiomyopathy, asthma, hypertension, chronic systolic congestive heart failure, status post ICD placement, hx of pneumonia, which are all stable. Work allergens may aggravate cough, follow with ENT for nasal septum issues.    Events since last clinic visit: She presents today for follow-up visit of mild intermittent asthma. At her last visit she was started on an Arnuity, and since then has had significant improvement in her cough and overall breathing. She states when she does not take a maintenance inhaler for cough does increase, it is nonproductive. Overall with significant improvement since starting ICS. No fevers, no chills, no nausea, no vomiting, no diarrhea, no worsening dyspnea on exertion.     Medication:   Current Outpatient Rx  Name  Route  Sig  Dispense  Refill  . albuterol (PROVENTIL HFA;VENTOLIN HFA) 108 (90 BASE) MCG/ACT inhaler   Inhalation   Inhale 2 puffs into the lungs daily as needed for wheezing or shortness of breath.         . clindamycin (CLEOCIN) 300 MG capsule   Oral   Take 1 capsule (300 mg total) by mouth 3 (three) times daily.   15 capsule   0   . cyanocobalamin (,VITAMIN B-12,) 1000 MCG/ML injection      INJECT 1 ML INTO THE MUSCLE ONCE A WEEK   10 mL   5   . cyclobenzaprine (FLEXERIL) 10 MG tablet   Oral   Take 10 mg by mouth 3 (three) times daily as needed for muscle spasms.         . DULoxetine (CYMBALTA) 60 MG capsule      TAKE (1) CAPSULE BY MOUTH EVERY DAY   30 capsule   0   . fexofenadine (ALLEGRA) 180 MG tablet   Oral   Take 180 mg by mouth at bedtime.         . fluticasone (FLONASE) 50 MCG/ACT nasal spray      USE 2 SPRAYS INTO BOTH NOSTRILS ONCE DAILY AS DIRECTED BY PHYSICIAN.   48 g   1   . Fluticasone Furoate (ARNUITY ELLIPTA) 100 MCG/ACT AEPB   Inhalation   Inhale 1 puff into the lungs daily.   30 each   4   . JUNEL FE 1/20 1-20 MG-MCG tablet      TAKE ONE (1) TABLET BY MOUTH ONCE DAILY   1 Package   3   . losartan (COZAAR) 25 MG tablet      TAKE (1) TABLET BY MOUTH EVERY DAY   30 tablet   5   . magnesium 30 MG tablet   Oral   Take 30 mg by mouth daily.         . Melatonin 3 MG TABS   Oral   Take 3 mg by mouth at bedtime as needed.          . montelukast (SINGULAIR) 10 MG tablet      TAKE (1) TABLET BY MOUTH EVERY DAY   90  tablet   1   . oxyCODONE (OXY IR/ROXICODONE) 5 MG immediate release tablet   Oral   Take 1-2 tablets (5-10 mg total) by mouth every 6 (six) hours as needed for severe pain.   30 tablet   0   . spironolactone (ALDACTONE) 25 MG tablet      TAKE ONE (1) TABLET BY MOUTH ONCE DAILY   30 tablet   0   . topiramate (TOPAMAX) 50 MG tablet   Oral   Take 1 tablet (50 mg total) by mouth at bedtime. Patient taking differently: Take 100 mg by mouth at bedtime.    30 tablet   1   . traMADol (ULTRAM) 50 MG tablet   Oral   Take 1 tablet (50 mg total) by mouth every 6 (six) hours as needed.   40 tablet   0   . traZODone (DESYREL) 50 MG tablet      TAKE 1/2-1 TABLETS BY MOUTH AT BEDTIME AS NEEDED FOR SLEEP   30 tablet   3   . valACYclovir (VALTREX) 1000 MG tablet   Oral   Take 1 tablet (1,000 mg total) by mouth 2 (two) times daily. Patient taking differently: Take 1,000 mg by mouth 2 (two) times daily as needed (fever blisters).    20 tablet   0      Review of Systems  Constitutional: Negative for fever and chills.  Eyes:  Negative for blurred vision and double vision.  Respiratory: Positive for cough.   Cardiovascular: Negative for chest pain.  Gastrointestinal: Negative for heartburn and nausea.  Genitourinary: Negative for dysuria.  Musculoskeletal: Negative for myalgias.  Neurological: Negative for dizziness and headaches.      Allergies:  Adhesive; Penicillin g; Coreg; Metoprolol; Latex; Levofloxacin; and Penicillins  Physical Examination:  VS: There were no vitals taken for this visit.  General Appearance: No distress  HEENT: PERRLA, no ptosis, no other lesions noticed Pulmonary:normal breath sounds., diaphragmatic excursion normal.No wheezing, No rales   Cardiovascular:  Normal S1,S2.  No m/r/g.     Abdomen:Exam: Benign, Soft, non-tender, No masses  Skin:   warm, no rashes, no ecchymosis  Extremities: normal, no cyanosis, clubbing, warm with normal capillary refill.      07/09/15 PFTs - FEV1 80, FEV1/FVC 72, FEF 25-70 562, DLCO 91%. No significant obstruction,recurrent bronchodilator response, DLCO uncorrected within normal limits, normal curves. 6 minute walk test-no significant desaturations, lowest desaturation 96%, 1181 feet, 360 m    Assessment and Plan: 55 year old seen in follow-up for asthma Asthma, chronic Previously seen by [facility] [place] [facility], Dr. Lake Bells, and most recently seen by [facility] Dr. Vella Kohler. She has transition care back to [facility] pulmonary. Her asthma is mild intermittent, it seems this is more seasonal and may have a work-related component to it. I'm not totally convinced that she needs to be on a year-round maintenance inhaler. But she does seem to have triggers especially in the winter and early spring season, maybe during these time she should be on a maintenance inhaler. The above was discussed with the patient, and she is in agreement with. She is currently on Arnuity with great improvement, therefore we will continue with this.  Pfts are essentially  normal, with moderate decrease in FEF 25-75 62% (FEV1 80%, FEV1/FVC 72%) Normal 70mwt - no desats, walked 1126ft/360m   Off note, she claims that she has allergies to prednisone, but her descriptions are more in line with side effects of high-dose steroids.  I have discussed with her  in the future if she does need prednisone, we can start off at lower dose, she will consider this when that time comes.  We plan for full pulmonary function testing, 6 minute walk test, and maintenance inhaler during fall winter and early spring.  Plan: -Arunity 170mcg - 1 puff daily, -gargle and rinse after each use.  -Avoid noxious substances such as smoke, perfumes, and other sick contacts. -Continue with outpatient allergy regiment.     Cough Significantly improved since starting maintenance inhaler. Cont Arnuity  Plan: -Supportive care -Arunity 17mcg daily      Updated Medication List Outpatient Encounter Prescriptions as of 07/12/2015  Medication Sig  . albuterol (PROVENTIL HFA;VENTOLIN HFA) 108 (90 BASE) MCG/ACT inhaler Inhale 2 puffs into the lungs daily as needed for wheezing or shortness of breath.  . clindamycin (CLEOCIN) 300 MG capsule Take 1 capsule (300 mg total) by mouth 3 (three) times daily.  . cyanocobalamin (,VITAMIN B-12,) 1000 MCG/ML injection INJECT 1 ML INTO THE MUSCLE ONCE A WEEK  . cyclobenzaprine (FLEXERIL) 10 MG tablet Take 10 mg by mouth 3 (three) times daily as needed for muscle spasms.  . DULoxetine (CYMBALTA) 60 MG capsule TAKE (1) CAPSULE BY MOUTH EVERY DAY  . fexofenadine (ALLEGRA) 180 MG tablet Take 180 mg by mouth at bedtime.  . fluticasone (FLONASE) 50 MCG/ACT nasal spray USE 2 SPRAYS INTO BOTH NOSTRILS ONCE DAILY AS DIRECTED BY PHYSICIAN.  Marland Kitchen Fluticasone Furoate (ARNUITY ELLIPTA) 100 MCG/ACT AEPB Inhale 1 puff into the lungs daily.  Lenda Kelp FE 1/20 1-20 MG-MCG tablet TAKE ONE (1) TABLET BY MOUTH ONCE DAILY  . losartan (COZAAR) 25 MG tablet TAKE (1) TABLET BY  MOUTH EVERY DAY  . magnesium 30 MG tablet Take 30 mg by mouth daily.  . Melatonin 3 MG TABS Take 3 mg by mouth at bedtime as needed.   . montelukast (SINGULAIR) 10 MG tablet TAKE (1) TABLET BY MOUTH EVERY DAY  . oxyCODONE (OXY IR/ROXICODONE) 5 MG immediate release tablet Take 1-2 tablets (5-10 mg total) by mouth every 6 (six) hours as needed for severe pain.  Marland Kitchen spironolactone (ALDACTONE) 25 MG tablet TAKE ONE (1) TABLET BY MOUTH ONCE DAILY  . topiramate (TOPAMAX) 50 MG tablet Take 1 tablet (50 mg total) by mouth at bedtime. (Patient taking differently: Take 100 mg by mouth at bedtime. )  . traMADol (ULTRAM) 50 MG tablet Take 1 tablet (50 mg total) by mouth every 6 (six) hours as needed.  . traZODone (DESYREL) 50 MG tablet TAKE 1/2-1 TABLETS BY MOUTH AT BEDTIME AS NEEDED FOR SLEEP  . valACYclovir (VALTREX) 1000 MG tablet Take 1 tablet (1,000 mg total) by mouth 2 (two) times daily. (Patient taking differently: Take 1,000 mg by mouth 2 (two) times daily as needed (fever blisters). )   No facility-administered encounter medications on file as of 07/12/2015.    Orders for this visit: No orders of the defined types were placed in this encounter.    Thank  you for the visitation and for allowing  [facility] to assist in the care of your patient. Our recommendations are noted above.  Please contact us if we can be of further service.  Vilinda Boehringer, MD [facility] Office Number: [phone]  Note: This note was prepared with Dragon dictation along with smaller phrase technology. Any transcriptional errors that result from this process are unintentional.

## 2015-07-13 ENCOUNTER — Ambulatory Visit: Payer: BLUE CROSS/BLUE SHIELD

## 2015-07-27 ENCOUNTER — Encounter: Payer: BLUE CROSS/BLUE SHIELD | Admitting: Internal Medicine

## 2015-07-28 ENCOUNTER — Other Ambulatory Visit: Payer: Self-pay | Admitting: Internal Medicine

## 2015-08-03 ENCOUNTER — Encounter: Payer: Self-pay | Admitting: Cardiology

## 2015-08-06 ENCOUNTER — Other Ambulatory Visit: Payer: Self-pay | Admitting: Internal Medicine

## 2015-08-06 LAB — CUP PACEART REMOTE DEVICE CHECK
Battery Remaining Longevity: 77 mo
Battery Voltage: 2.97 V
Brady Statistic AP VP Percent: 0.02 %
Brady Statistic AP VS Percent: 0.01 %
Brady Statistic AS VP Percent: 99.87 %
Brady Statistic AS VS Percent: 0.09 %
Brady Statistic RA Percent Paced: 0.03 %
Brady Statistic RV Percent Paced: 99.86 %
Date Time Interrogation Session: 20170502214517
HighPow Impedance: 86 Ohm
Implantable Lead Implant Date: 20160421
Implantable Lead Implant Date: 20160421
Implantable Lead Implant Date: 20161006
Implantable Lead Implant Date: 20161006
Implantable Lead Location: 753858
Implantable Lead Location: 753858
Implantable Lead Location: 753859
Implantable Lead Location: 753860
Implantable Lead Model: 5071
Implantable Lead Model: 5071
Implantable Lead Model: 5076
Lead Channel Impedance Value: 323 Ohm
Lead Channel Impedance Value: 342 Ohm
Lead Channel Impedance Value: 399 Ohm
Lead Channel Impedance Value: 4047 Ohm
Lead Channel Impedance Value: 4047 Ohm
Lead Channel Impedance Value: 456 Ohm
Lead Channel Pacing Threshold Amplitude: 0.5 V
Lead Channel Pacing Threshold Amplitude: 1.625 V
Lead Channel Pacing Threshold Pulse Width: 0.4 ms
Lead Channel Pacing Threshold Pulse Width: 0.4 ms
Lead Channel Sensing Intrinsic Amplitude: 2 mV
Lead Channel Sensing Intrinsic Amplitude: 2 mV
Lead Channel Sensing Intrinsic Amplitude: 30 mV
Lead Channel Sensing Intrinsic Amplitude: 30 mV
Lead Channel Setting Pacing Amplitude: 1.5 V
Lead Channel Setting Pacing Amplitude: 2 V
Lead Channel Setting Pacing Amplitude: 2.5 V
Lead Channel Setting Pacing Pulse Width: 0.4 ms
Lead Channel Setting Pacing Pulse Width: 0.8 ms
Lead Channel Setting Sensing Sensitivity: 0.3 mV

## 2015-08-17 ENCOUNTER — Other Ambulatory Visit: Payer: Self-pay | Admitting: Internal Medicine

## 2015-08-17 ENCOUNTER — Other Ambulatory Visit (INDEPENDENT_AMBULATORY_CARE_PROVIDER_SITE_OTHER): Payer: BLUE CROSS/BLUE SHIELD

## 2015-08-17 DIAGNOSIS — R5383 Other fatigue: Secondary | ICD-10-CM

## 2015-08-17 DIAGNOSIS — Z7901 Long term (current) use of anticoagulants: Secondary | ICD-10-CM | POA: Diagnosis not present

## 2015-08-17 DIAGNOSIS — E785 Hyperlipidemia, unspecified: Secondary | ICD-10-CM

## 2015-08-17 DIAGNOSIS — Z113 Encounter for screening for infections with a predominantly sexual mode of transmission: Secondary | ICD-10-CM

## 2015-08-17 LAB — LIPID PANEL
Cholesterol: 236 mg/dL — ABNORMAL HIGH (ref 0–200)
HDL: 50.9 mg/dL (ref >39.00)
LDL Cholesterol: 152 mg/dL — ABNORMAL HIGH (ref 0–99)
NonHDL: 185.26
Total CHOL/HDL Ratio: 5
Triglycerides: 164 mg/dL — ABNORMAL HIGH (ref 0.0–149.0)
VLDL: 32.8 mg/dL (ref 0.0–40.0)

## 2015-08-17 LAB — CBC WITH DIFFERENTIAL/PLATELET
Basophils Absolute: 0 10*3/uL (ref 0.0–0.1)
Basophils Relative: 0.6 % (ref 0.0–3.0)
Eosinophils Absolute: 0.1 10*3/uL (ref 0.0–0.7)
Eosinophils Relative: 2.1 % (ref 0.0–5.0)
HCT: 37.5 % (ref 36.0–46.0)
Hemoglobin: 12.7 g/dL (ref 12.0–15.0)
Lymphocytes Relative: 30.1 % (ref 12.0–46.0)
Lymphs Abs: 1.7 10*3/uL (ref 0.7–4.0)
MCHC: 33.7 g/dL (ref 30.0–36.0)
MCV: 91.3 fl (ref 78.0–100.0)
Monocytes Absolute: 0.4 10*3/uL (ref 0.1–1.0)
Monocytes Relative: 7.3 % (ref 3.0–12.0)
Neutro Abs: 3.5 10*3/uL (ref 1.4–7.7)
Neutrophils Relative %: 59.9 % (ref 43.0–77.0)
Platelets: 201 10*3/uL (ref 150.0–400.0)
RBC: 4.11 Mil/uL (ref 3.87–5.11)
RDW: 13.4 % (ref 11.5–15.5)
WBC: 5.8 10*3/uL (ref 4.0–10.5)

## 2015-08-17 LAB — TSH: TSH: 3.01 u[IU]/mL (ref 0.35–4.50)

## 2015-08-17 LAB — HEPATIC FUNCTION PANEL
ALT: 10 U/L (ref 0–35)
AST: 13 U/L (ref 0–37)
Albumin: 3.8 g/dL (ref 3.5–5.2)
Alkaline Phosphatase: 81 U/L (ref 39–117)
Bilirubin, Direct: 0.1 mg/dL (ref 0.0–0.3)
Total Bilirubin: 0.3 mg/dL (ref 0.2–1.2)
Total Protein: 6.5 g/dL (ref 6.0–8.3)

## 2015-08-18 LAB — HIV ANTIBODY (ROUTINE TESTING W REFLEX): HIV 1&2 Ab, 4th Generation: NONREACTIVE

## 2015-08-18 LAB — HEPATITIS C ANTIBODY: HCV Ab: NEGATIVE

## 2015-08-19 NOTE — Addendum Note (Signed)
Addended by: [name] on: 08/19/2015 08:13 PM   Modules accepted: Orders

## 2015-08-20 ENCOUNTER — Ambulatory Visit (INDEPENDENT_AMBULATORY_CARE_PROVIDER_SITE_OTHER): Payer: BLUE CROSS/BLUE SHIELD | Admitting: Internal Medicine

## 2015-08-20 ENCOUNTER — Other Ambulatory Visit (HOSPITAL_COMMUNITY)
Admission: RE | Admit: 2015-08-20 | Discharge: 2015-08-20 | Disposition: A | Discharge status: Home / Self Care | Payer: BLUE CROSS/BLUE SHIELD | Source: Ambulatory Visit | Attending: Internal Medicine | Admitting: Internal Medicine

## 2015-08-20 ENCOUNTER — Encounter: Payer: Self-pay | Admitting: Internal Medicine

## 2015-08-20 VITALS — BP 110/72 | HR 89 | Temp 98.4°F | Resp 12 | Ht 65.25 in | Wt 174.2 lb

## 2015-08-20 DIAGNOSIS — Z1151 Encounter for screening for human papillomavirus (HPV): Secondary | ICD-10-CM | POA: Insufficient documentation

## 2015-08-20 DIAGNOSIS — Z0000 Encounter for general adult medical examination without abnormal findings: Secondary | ICD-10-CM

## 2015-08-20 DIAGNOSIS — Z1239 Encounter for other screening for malignant neoplasm of breast: Secondary | ICD-10-CM

## 2015-08-20 DIAGNOSIS — Z01419 Encounter for gynecological examination (general) (routine) without abnormal findings: Secondary | ICD-10-CM | POA: Diagnosis present

## 2015-08-20 DIAGNOSIS — Z124 Encounter for screening for malignant neoplasm of cervix: Secondary | ICD-10-CM

## 2015-08-20 LAB — BASIC METABOLIC PANEL
BUN: 18 mg/dL (ref 7–25)
CO2: 21 mmol/L (ref 20–31)
Calcium: 8.6 mg/dL (ref 8.6–10.4)
Chloride: 108 mmol/L (ref 98–110)
Creat: 1.35 mg/dL — ABNORMAL HIGH (ref 0.50–1.05)
Glucose, Bld: 81 mg/dL (ref 65–99)
Potassium: 4 mmol/L (ref 3.5–5.3)
Sodium: 137 mmol/L (ref 135–146)

## 2015-08-20 MED ORDER — TRAMADOL HCL 50 MG PO TABS: 50.0000 mg | ORAL_TABLET | Freq: Four times a day (QID) | ORAL | Status: DC | PRN

## 2015-08-20 MED ORDER — KETOROLAC TROMETHAMINE 10 MG PO TABS: 10.0000 mg | ORAL_TABLET | Freq: Four times a day (QID) | ORAL | Status: DC | PRN

## 2015-08-20 NOTE — Progress Notes (Signed)
Patient ID: Suzanne Spears, female    DOB: 1960-05-05  Age: 55 y.o. MRN: [id number]  The patient is here for annual physical  examination and management of other chronic and acute problems.  PAP normal 2014 MAMMOGRAM normal 2015, pacemaker last year  Colonoscopy 2012   The risk factors are reflected in the social history.  The roster of all physicians providing medical care to patient - is listed in the Snapshot section of the chart.  Home safety : The patient has smoke detectors in the home. They wear seatbelts.  There are no firearms at home. There is no violence in the home.   There is no risks for hepatitis, STDs or HIV. There is no   history of blood transfusion. They have no travel history to infectious disease endemic areas of the world.  The patient has seen their dentist in the last six month. They have seen their eye doctor in the last year. They admit to slight hearing difficulty with regard to whispered voices and some television programs.  They have deferred audiologic testing in the last year.  They do not  have excessive sun exposure. Discussed the need for sun protection: hats, long sleeves and use of sunscreen if there is significant sun exposure.   Diet: the importance of a healthy diet is discussed. They do have a healthy diet.  The benefits of regular aerobic exercise were discussed. She is not exercising and has gained weight. .   Depression screen: there are no signs or vegative symptoms of depression- irritability, change in appetite, anhedonia, sadness/tearfullness.  The following portions of the patient's history were reviewed and updated as appropriate: allergies, current medications, past family history, past medical history,  past surgical history, past social history  and problem list.  Visual acuity was not assessed per patient preference since she has regular follow up with her ophthalmologist. Hearing and body mass index were assessed and reviewed.   During  the course of the visit the patient was educated and counseled about appropriate screening and preventive services including : fall prevention , diabetes screening, nutrition counseling, colorectal cancer screening, and recommended immunizations.    CC: The primary encounter diagnosis was Breast cancer screening. Diagnoses of Cervical cancer screening and Visit for preventive health examination were also pertinent to this visit.  Right arm numbness has been recurrent for several months. She has a history of Cervical spine surgery in 2014 by Arnoldo Morale.  Can't have an MRI bc of pacer   Waiting to get appt with [facility]   numbness  In the deltoid area and forearm . hand is not weak but feels tingly and cold,   Refused a prednisone taper due to multiple severe adverse effects from prior trials.   No prior steroid injection so not sure if she tolerates it.  Was given oxycodone, has tramadol   History Corabella has a past medical history of Pericarditis (2008); Left bundle branch block (2008); Mitral valve prolapse syndrome; Allergy; Hip pain, right (200); Cardiomyopathy, dilated, nonischemic ([place]); Asthma; Hypertension; Chronic systolic (congestive) heart failure ([place]); Pneumonia (2016); Hyperlipidemia; Presence of permanent cardiac pacemaker; AICD (automatic cardioverter/defibrillator) present; Cough; Shortness of breath dyspnea; History of bronchitis; Headache(784.0); Spinal headache; Dizziness; Peripheral neuropathy (HCC); GERD (gastroesophageal reflux disease); Anemia; Depression; and History of shingles.   She has past surgical history that includes Cesarean section; Appendectomy (2009); turbinectomy (2009); ganglionic cyst (remote); tendon release surgery (Left); Colonoscopy; Tonsillectomy; Anterior cervical decomp/discectomy fusion (N/A, 10/21/2012); bi-ventricular implantable cardioverter defibrillator (N/A, 06/15/2014);  Cardiac catheterization (01/13/05/2015); blood clot removed from left top hand;  Thoracotomy (Left, 11/30/2014); and Epicardial pacing lead placement (N/A, 11/30/2014).   Her family history includes Cancer in her paternal grandfather; Cancer (age of onset: 36) in her mother; Cancer (age of onset: 95) in her maternal grandmother; Diabetes in her maternal grandmother; Pneumonia in her father.She reports that she has never smoked. She has never used smokeless tobacco. She reports that she drinks alcohol. She reports that she does not use illicit drugs.  Outpatient Prescriptions Prior to Visit  Medication Sig Dispense Refill  . albuterol (PROVENTIL HFA;VENTOLIN HFA) 108 (90 BASE) MCG/ACT inhaler Inhale 2 puffs into the lungs daily as needed for wheezing or shortness of breath.    . cyanocobalamin (,VITAMIN B-12,) 1000 MCG/ML injection INJECT 1 ML INTO THE MUSCLE ONCE A WEEK 10 mL 5  . cyclobenzaprine (FLEXERIL) 10 MG tablet Take 10 mg by mouth 3 (three) times daily as needed for muscle spasms.    . DULoxetine (CYMBALTA) 60 MG capsule TAKE (1) CAPSULE BY MOUTH EVERY DAY 30 capsule 3  . fexofenadine (ALLEGRA) 180 MG tablet Take 180 mg by mouth at bedtime.    . fluticasone (FLONASE) 50 MCG/ACT nasal spray USE 2 SPRAYS INTO BOTH NOSTRILS ONCE DAILY AS DIRECTED BY PHYSICIAN. 48 g 1  . Fluticasone Furoate (ARNUITY ELLIPTA) 100 MCG/ACT AEPB Inhale 1 puff into the lungs daily. 30 each 4  . JUNEL FE 1/20 1-20 MG-MCG tablet TAKE ONE (1) TABLET BY MOUTH ONCE DAILY 1 Package 3  . losartan (COZAAR) 25 MG tablet TAKE (1) TABLET BY MOUTH EVERY DAY 30 tablet 5  . magnesium 30 MG tablet Take 30 mg by mouth daily.    . montelukast (SINGULAIR) 10 MG tablet TAKE (1) TABLET BY MOUTH EVERY DAY 90 tablet 1  . oxyCODONE (OXY IR/ROXICODONE) 5 MG immediate release tablet Take 1-2 tablets (5-10 mg total) by mouth every 6 (six) hours as needed for severe pain. 30 tablet 0  . spironolactone (ALDACTONE) 25 MG tablet TAKE (1) TABLET BY MOUTH EVERY DAY 30 tablet 3  . topiramate (TOPAMAX) 50 MG tablet Take 1 tablet  (50 mg total) by mouth at bedtime. (Patient taking differently: Take 100 mg by mouth at bedtime. ) 30 tablet 1  . traZODone (DESYREL) 50 MG tablet TAKE 1/2-1 TABLETS BY MOUTH AT BEDTIME AS NEEDED FOR SLEEP 30 tablet 3  . valACYclovir (VALTREX) 1000 MG tablet Take 1 tablet (1,000 mg total) by mouth 2 (two) times daily. (Patient taking differently: Take 1,000 mg by mouth 2 (two) times daily as needed (fever blisters). ) 20 tablet 0  . traMADol (ULTRAM) 50 MG tablet Take 1 tablet (50 mg total) by mouth every 6 (six) hours as needed. 40 tablet 0  . Melatonin 3 MG TABS Take 3 mg by mouth at bedtime as needed. Reported on 08/20/2015     No facility-administered medications prior to visit.    Review of Systems   Patient denies headache, fevers, malaise, unintentional weight loss, skin rash, eye pain, sinus congestion and sinus pain, sore throat, dysphagia,  hemoptysis , cough, dyspnea, wheezing, chest pain, palpitations, orthopnea, edema, abdominal pain, nausea, melena, diarrhea, constipation, flank pain, dysuria, hematuria, urinary  Frequency, nocturia, numbness, tingling, seizures,  Focal weakness, Loss of consciousness,  Tremor, insomnia, depression, anxiety, and suicidal ideation.     Objective:  BP 110/72 mmHg  Pulse 89  Temp(Src) 98.4 F (36.9 C) (Oral)  Resp 12  Ht 5' 5.25" (1.657 m)  Wt  174 lb 4 oz (79.039 kg)  BMI 28.79 kg/m2  SpO2 98%  LMP 04/25/2015 (Approximate)  Physical Exam  General Appearance:    Alert, cooperative, no distress, appears stated age  Head:    Normocephalic, without obvious abnormality, atraumatic  Eyes:    PERRL, conjunctiva/corneas clear, EOM's intact, fundi    benign, both eyes  Ears:    Normal TM's and external ear canals, both ears  Nose:   Nares normal, septum midline, mucosa normal, no drainage    or sinus tenderness  Throat:   Lips, mucosa, and tongue normal; teeth and gums normal  Neck:   Supple, symmetrical, trachea midline, no adenopathy;     thyroid:  no enlargement/tenderness/nodules; no carotid   bruit or JVD  Back:     Symmetric, no curvature, ROM normal, no CVA tenderness  Lungs:     Clear to auscultation bilaterally, respirations unlabored  Chest Wall:    No tenderness or deformity   Heart:    Regular rate and rhythm, S1 and S2 normal, no murmur, rub   or gallop  Breast Exam:    No tenderness, masses, or nipple abnormality  Abdomen:     Soft, non-tender, bowel sounds active all four quadrants,    no masses, no organomegaly  Genitalia:    Pelvic: cervix normal in appearance, external genitalia normal, no adnexal masses or tenderness, no cervical motion tenderness, rectovaginal septum normal, uterus normal size, shape, and consistency and vagina normal without discharge  Extremities:   Extremities normal, atraumatic, no cyanosis or edema  Pulses:   2+ and symmetric all extremities  Skin:   Skin color, texture, turgor normal, no rashes or lesions  Lymph nodes:   Cervical, supraclavicular, and axillary nodes normal  Neurologic:   CNII-XII intact, normal strength, sensation and reflexes    throughout     Assessment & Plan:   Problem List Items Addressed This Visit    Visit for preventive health examination    Annual comprehensive preventive exam was done as well as an evaluation and management of chronic conditions .  During the course of the visit the patient was educated and counseled about appropriate screening and preventive services including :  diabetes screening, lipid analysis with projected  10 year  risk for CAD , nutrition counseling, breast, cervical and colorectal cancer screening, and recommended immunizations.  Printed recommendations for health maintenance screenings was given       Other Visit Diagnoses    Breast cancer screening    -  Primary    Relevant Orders    MM DIGITAL SCREENING BILATERAL    Cervical cancer screening        Relevant Orders    Cytology - PAP (Completed)       I am having Ms.  Plake start on ketorolac. I am also having her maintain her fexofenadine, magnesium, albuterol, topiramate, Melatonin, valACYclovir, cyclobenzaprine, oxyCODONE, cyanocobalamin, traZODone, JUNEL FE 1/20, losartan, Fluticasone Furoate, montelukast, fluticasone, spironolactone, DULoxetine, and traMADol.  Meds ordered this encounter  Medications  . traMADol (ULTRAM) 50 MG tablet    Sig: Take 1 tablet (50 mg total) by mouth every 6 (six) hours as needed.    Dispense:  90 tablet    Refill:  2  . ketorolac (TORADOL) 10 MG tablet    Sig: Take 1 tablet (10 mg total) by mouth every 6 (six) hours as needed.    Dispense:  20 tablet    Refill:  0    Medications Discontinued  During This Encounter  Medication Reason  . traMADol (ULTRAM) 50 MG tablet Reorder    Follow-up: No Follow-up on file.   [name], MD

## 2015-08-20 NOTE — Progress Notes (Signed)
Pre-visit discussion using our clinic review tool. No additional management support is needed unless otherwise documented below in the visit note.  

## 2015-08-20 NOTE — Patient Instructions (Signed)
Ask [facility] if you received the TDaP  (tetanus diptheria acellular pertussis) vaccine last year,  Or just the Tetanus    You can get the Pertussis vaccine at [facility] or at a local pharmacy,  Or we can order it and give it to you  Start a walking program 15 minutes 3 times weekly  Toradol 50 mg every 6 hours for 5 days,  Tramadol and tylenol every 6 hours as needed for neck pain /arm pain   Menopause is a normal process in which your reproductive ability comes to an end. This process happens gradually over a span of months to years, usually between the ages of 9 and 56. Menopause is complete when you have missed 12 consecutive menstrual periods. It is important to talk with your health care provider about some of the most common conditions that affect postmenopausal women, such as heart disease, cancer, and bone loss (osteoporosis). Adopting a healthy lifestyle and getting preventive care can help to promote your health and wellness. Those actions can also lower your chances of developing some of these common conditions. WHAT SHOULD I KNOW ABOUT MENOPAUSE? During menopause, you may experience a number of symptoms, such as:  Moderate-to-severe hot flashes.  Night sweats.  Decrease in sex drive.  Mood swings.  Headaches.  Tiredness.  Irritability.  Memory problems.  Insomnia. Choosing to treat or not to treat menopausal changes is an individual decision that you make with your health care provider. WHAT SHOULD I KNOW ABOUT HORMONE REPLACEMENT THERAPY AND SUPPLEMENTS? Hormone therapy products are effective for treating symptoms that are associated with menopause, such as hot flashes and night sweats. Hormone replacement carries certain risks, especially as you become older. If you are thinking about using estrogen or estrogen with progestin treatments, discuss the benefits and risks with your health care provider. WHAT SHOULD I KNOW ABOUT HEART DISEASE AND  STROKE? Heart disease, heart attack, and stroke become more likely as you age. This may be due, in part, to the hormonal changes that your body experiences during menopause. These can affect how your body processes dietary fats, triglycerides, and cholesterol. Heart attack and stroke are both medical emergencies. There are many things that you can do to help prevent heart disease and stroke:  Have your blood pressure checked at least every 1-2 years. High blood pressure causes heart disease and increases the risk of stroke.  If you are 28-59 years old, ask your health care provider if you should take aspirin to prevent a heart attack or a stroke.  Do not use any tobacco products, including cigarettes, chewing tobacco, or electronic cigarettes. If you need help quitting, ask your health care provider.  It is important to eat a healthy diet and maintain a healthy weight.  Be sure to include plenty of vegetables, fruits, low-fat dairy products, and lean protein.  Avoid eating foods that are high in solid fats, added sugars, or salt (sodium).  Get regular exercise. This is one of the most important things that you can do for your health.  Try to exercise for at least 150 minutes each week. The type of exercise that you do should increase your heart rate and make you sweat. This is known as moderate-intensity exercise.  Try to do strengthening exercises at least twice each week. Do these in addition to the moderate-intensity exercise.  Know your numbers.Ask your health care provider to check your cholesterol and your blood glucose. Continue to have your blood tested  as directed by your health care provider. WHAT SHOULD I KNOW ABOUT CANCER SCREENING? There are several types of cancer. Take the following steps to reduce your risk and to catch any cancer development as early as possible. Breast Cancer  Practice breast self-awareness.  This means understanding how your breasts normally appear  and feel.  It also means doing regular breast self-exams. Let your health care provider know about any changes, no matter how small.  If you are 40 or older, have a clinician do a breast exam (clinical breast exam or CBE) every year. Depending on your age, family history, and medical history, it may be recommended that you also have a yearly breast X-ray (mammogram).  If you have a family history of breast cancer, talk with your health care provider about genetic screening.  If you are at high risk for breast cancer, talk with your health care provider about having an MRI and a mammogram every year.  Breast cancer (BRCA) gene test is recommended for women who have family members with BRCA-related cancers. Results of the assessment will determine the need for genetic counseling and BRCA1 and for BRCA2 testing. BRCA-related cancers include these types:  Breast. This occurs in males or females.  Ovarian.  Tubal. This may also be called fallopian tube cancer.  Cancer of the abdominal or pelvic lining (peritoneal cancer).  Prostate.  Pancreatic. Cervical, Uterine, and Ovarian Cancer Your health care provider may recommend that you be screened regularly for cancer of the pelvic organs. These include your ovaries, uterus, and vagina. This screening involves a pelvic exam, which includes checking for microscopic changes to the surface of your cervix (Pap test).  For women ages 21-65, health care providers may recommend a pelvic exam and a Pap test every three years. For women ages 37-65, they may recommend the Pap test and pelvic exam, combined with testing for human papilloma virus (HPV), every five years. Some types of HPV increase your risk of cervical cancer. Testing for HPV may also be done on women of any age who have unclear Pap test results.  Other health care providers may not recommend any screening for nonpregnant women who are considered low risk for pelvic cancer and have no  symptoms. Ask your health care provider if a screening pelvic exam is right for you.  If you have had past treatment for cervical cancer or a condition that could lead to cancer, you need Pap tests and screening for cancer for at least 20 years after your treatment. If Pap tests have been discontinued for you, your risk factors (such as having a new sexual partner) need to be reassessed to determine if you should start having screenings again. Some women have medical problems that increase the chance of getting cervical cancer. In these cases, your health care provider may recommend that you have screening and Pap tests more often.  If you have a family history of uterine cancer or ovarian cancer, talk with your health care provider about genetic screening.  If you have vaginal bleeding after reaching menopause, tell your health care provider.  There are currently no reliable tests available to screen for ovarian cancer. Lung Cancer Lung cancer screening is recommended for adults 24-38 years old who are at high risk for lung cancer because of a history of smoking. A yearly low-dose CT scan of the lungs is recommended if you:  Currently smoke.  Have a history of at least 30 pack-years of smoking and you currently smoke or  have quit within the past 15 years. A pack-year is smoking an average of one pack of cigarettes per day for one year. Yearly screening should:  Continue until it has been 15 years since you quit.  Stop if you develop a health problem that would prevent you from having lung cancer treatment. Colorectal Cancer  This type of cancer can be detected and can often be prevented.  Routine colorectal cancer screening usually begins at age 20 and continues through age 72.  If you have risk factors for colon cancer, your health care provider may recommend that you be screened at an earlier age.  If you have a family history of colorectal cancer, talk with your health care provider  about genetic screening.  Your health care provider may also recommend using home test kits to check for hidden blood in your stool.  A small camera at the end of a tube can be used to examine your colon directly (sigmoidoscopy or colonoscopy). This is done to check for the earliest forms of colorectal cancer.  Direct examination of the colon should be repeated every 5-10 years until age 66. However, if early forms of precancerous polyps or small growths are found or if you have a family history or genetic risk for colorectal cancer, you may need to be screened more often. Skin Cancer  Check your skin from head to toe regularly.  Monitor any moles. Be sure to tell your health care provider:  About any new moles or changes in moles, especially if there is a change in a mole's shape or color.  If you have a mole that is larger than the size of a pencil eraser.  If any of your family members has a history of skin cancer, especially at a young age, talk with your health care provider about genetic screening.  Always use sunscreen. Apply sunscreen liberally and repeatedly throughout the day.  Whenever you are outside, protect yourself by wearing long sleeves, pants, a wide-brimmed hat, and sunglasses. WHAT SHOULD I KNOW ABOUT OSTEOPOROSIS? Osteoporosis is a condition in which bone destruction happens more quickly than new bone creation. After menopause, you may be at an increased risk for osteoporosis. To help prevent osteoporosis or the bone fractures that can happen because of osteoporosis, the following is recommended:  If you are 39-22 years old, get at least 1,000 mg of calcium and at least 600 mg of vitamin D per day.  If you are older than age 67 but younger than age 58, get at least 1,200 mg of calcium and at least 600 mg of vitamin D per day.  If you are older than age 21, get at least 1,200 mg of calcium and at least 800 mg of vitamin D per day. Smoking and excessive alcohol intake  increase the risk of osteoporosis. Eat foods that are rich in calcium and vitamin D, and do weight-bearing exercises several times each week as directed by your health care provider. WHAT SHOULD I KNOW ABOUT HOW MENOPAUSE AFFECTS [place]? Depression may occur at any age, but it is more common as you become older. Common symptoms of depression include:  Low or sad mood.  Changes in sleep patterns.  Changes in appetite or eating patterns.  Feeling an overall lack of motivation or enjoyment of activities that you previously enjoyed.  Frequent crying spells. Talk with your health care provider if you think that you are experiencing depression. WHAT SHOULD I KNOW ABOUT IMMUNIZATIONS? It is important that  you get and maintain your immunizations. These include:  Tetanus, diphtheria, and pertussis (Tdap) booster vaccine.  Influenza every year before the flu season begins.  Pneumonia vaccine.  Shingles vaccine. Your health care provider may also recommend other immunizations.   This information is not intended to replace advice given to you by your health care provider. Make sure you discuss any questions you have with your health care provider.   Document Released: 04/04/2005 Document Revised: 03/03/2014 Document Reviewed: 10/13/2013 Elsevier Interactive Patient Education [employer].

## 2015-08-21 ENCOUNTER — Encounter: Payer: Self-pay | Admitting: Internal Medicine

## 2015-08-21 ENCOUNTER — Other Ambulatory Visit: Payer: Self-pay | Admitting: Internal Medicine

## 2015-08-21 DIAGNOSIS — N179 Acute kidney failure, unspecified: Secondary | ICD-10-CM

## 2015-08-21 LAB — CYTOLOGY - PAP

## 2015-08-21 NOTE — Assessment & Plan Note (Signed)
Annual comprehensive preventive exam was done as well as an evaluation and management of chronic conditions .  During the course of the visit the patient was educated and counseled about appropriate screening and preventive services including :  diabetes screening, lipid analysis with projected  10 year  risk for CAD , nutrition counseling, breast, cervical and colorectal cancer screening, and recommended immunizations.  Printed recommendations for health maintenance screenings was given 

## 2015-08-27 ENCOUNTER — Telehealth: Payer: Self-pay

## 2015-08-27 ENCOUNTER — Other Ambulatory Visit (INDEPENDENT_AMBULATORY_CARE_PROVIDER_SITE_OTHER): Payer: BLUE CROSS/BLUE SHIELD

## 2015-08-27 DIAGNOSIS — N179 Acute kidney failure, unspecified: Secondary | ICD-10-CM

## 2015-08-27 LAB — BASIC METABOLIC PANEL
BUN: 18 mg/dL (ref 6–23)
CO2: 23 mEq/L (ref 19–32)
Calcium: 9 mg/dL (ref 8.4–10.5)
Chloride: 110 mEq/L (ref 96–112)
Creatinine, Ser: 1.19 mg/dL (ref 0.40–1.20)
GFR: 50.19 mL/min — ABNORMAL LOW (ref >60.00)
Glucose, Bld: 88 mg/dL (ref 70–99)
Potassium: 3.9 mEq/L (ref 3.5–5.1)
Sodium: 135 mEq/L (ref 135–145)

## 2015-08-27 NOTE — Telephone Encounter (Signed)
Just a FYI. Pt came for lab only visit today. Saw future order for BMET and urinalysis. Pt said she was not aware she was going to be giving a urine sample today; she went right before she came. Pt unable to go at lab visit. Pt did not want to take a cup home to bring back because she was not sure when she would have the time to drop it off.

## 2015-08-29 ENCOUNTER — Other Ambulatory Visit: Payer: BLUE CROSS/BLUE SHIELD

## 2015-08-30 ENCOUNTER — Encounter: Payer: Self-pay | Admitting: Internal Medicine

## 2015-08-30 ENCOUNTER — Other Ambulatory Visit: Payer: Self-pay | Admitting: Internal Medicine

## 2015-08-30 DIAGNOSIS — R944 Abnormal results of kidney function studies: Secondary | ICD-10-CM

## 2015-09-03 ENCOUNTER — Other Ambulatory Visit: Payer: Self-pay | Admitting: Internal Medicine

## 2015-09-11 ENCOUNTER — Other Ambulatory Visit: Payer: Self-pay | Admitting: Ophthalmology

## 2015-09-11 ENCOUNTER — Other Ambulatory Visit: Payer: Self-pay | Admitting: Internal Medicine

## 2015-09-11 DIAGNOSIS — G453 Amaurosis fugax: Secondary | ICD-10-CM

## 2015-09-12 ENCOUNTER — Other Ambulatory Visit: Payer: Self-pay | Admitting: Ophthalmology

## 2015-09-12 DIAGNOSIS — G453 Amaurosis fugax: Secondary | ICD-10-CM

## 2015-09-13 ENCOUNTER — Encounter: Payer: Self-pay | Admitting: Internal Medicine

## 2015-09-14 NOTE — Telephone Encounter (Signed)
Pt dropped off FMLA paperwork to be filled out.. Placed in Dr. Demetrios Isaacs box. Please advise pt when ready

## 2015-09-18 ENCOUNTER — Telehealth: Payer: Self-pay | Admitting: Internal Medicine

## 2015-09-18 NOTE — Telephone Encounter (Signed)
FMLA forms have been completed and returned to [facility] in red folder.  The charge is $50

## 2015-09-18 NOTE — Telephone Encounter (Signed)
Advised pt and placed up front for pick up. Renaldo Fiddler, CMA

## 2015-09-21 ENCOUNTER — Ambulatory Visit
Admission: RE | Admit: 2015-09-21 | Discharge: 2015-09-21 | Disposition: A | Discharge status: Home / Self Care | Payer: BLUE CROSS/BLUE SHIELD | Source: Ambulatory Visit | Attending: Ophthalmology | Admitting: Ophthalmology

## 2015-09-21 DIAGNOSIS — I071 Rheumatic tricuspid insufficiency: Secondary | ICD-10-CM | POA: Diagnosis not present

## 2015-09-21 DIAGNOSIS — I119 Hypertensive heart disease without heart failure: Secondary | ICD-10-CM | POA: Insufficient documentation

## 2015-09-21 DIAGNOSIS — I340 Nonrheumatic mitral (valve) insufficiency: Secondary | ICD-10-CM | POA: Insufficient documentation

## 2015-09-21 DIAGNOSIS — G453 Amaurosis fugax: Secondary | ICD-10-CM

## 2015-09-21 DIAGNOSIS — I351 Nonrheumatic aortic (valve) insufficiency: Secondary | ICD-10-CM | POA: Insufficient documentation

## 2015-09-21 MED ORDER — IOPAMIDOL (ISOVUE-300) INJECTION 61%
75.0000 mL | Freq: Once | INTRAVENOUS | Status: AC | PRN
  Administered 2015-09-21: 75 mL via INTRAVENOUS

## 2015-09-21 NOTE — Progress Notes (Signed)
*  PRELIMINARY RESULTS* Echocardiogram 2D Echocardiogram has been performed.  Suzanne Spears 09/21/2015, 10:55 AM

## 2015-09-27 ENCOUNTER — Ambulatory Visit (INDEPENDENT_AMBULATORY_CARE_PROVIDER_SITE_OTHER): Payer: BLUE CROSS/BLUE SHIELD | Admitting: *Deleted

## 2015-09-27 DIAGNOSIS — I429 Cardiomyopathy, unspecified: Secondary | ICD-10-CM | POA: Diagnosis not present

## 2015-09-27 DIAGNOSIS — I5022 Chronic systolic (congestive) heart failure: Secondary | ICD-10-CM

## 2015-09-27 DIAGNOSIS — I428 Other cardiomyopathies: Secondary | ICD-10-CM

## 2015-09-28 ENCOUNTER — Encounter: Payer: Self-pay | Admitting: Internal Medicine

## 2015-09-28 NOTE — Progress Notes (Signed)
Remote ICD transmission.   

## 2015-10-03 ENCOUNTER — Encounter: Payer: Self-pay | Admitting: Cardiology

## 2015-10-10 ENCOUNTER — Other Ambulatory Visit: Payer: Self-pay | Admitting: Neurosurgery

## 2015-10-10 DIAGNOSIS — M5412 Radiculopathy, cervical region: Secondary | ICD-10-CM

## 2015-10-11 ENCOUNTER — Ambulatory Visit
Admission: RE | Admit: 2015-10-11 | Discharge: 2015-10-11 | Disposition: A | Discharge status: Home / Self Care | Payer: BLUE CROSS/BLUE SHIELD | Source: Ambulatory Visit | Attending: Internal Medicine | Admitting: Internal Medicine

## 2015-10-11 ENCOUNTER — Other Ambulatory Visit: Payer: Self-pay | Admitting: Internal Medicine

## 2015-10-11 DIAGNOSIS — Z1239 Encounter for other screening for malignant neoplasm of breast: Secondary | ICD-10-CM

## 2015-10-11 DIAGNOSIS — Z1231 Encounter for screening mammogram for malignant neoplasm of breast: Secondary | ICD-10-CM | POA: Diagnosis present

## 2015-10-16 LAB — CUP PACEART REMOTE DEVICE CHECK
Battery Remaining Longevity: 61 mo
Battery Voltage: 2.96 V
Brady Statistic AP VP Percent: 0.02 %
Brady Statistic AP VS Percent: 0.01 %
Brady Statistic AS VP Percent: 99.86 %
Brady Statistic AS VS Percent: 0.11 %
Brady Statistic RA Percent Paced: 0.03 %
Brady Statistic RV Percent Paced: 99.86 %
Date Time Interrogation Session: 20170803112305
HighPow Impedance: 82 Ohm
Implantable Lead Implant Date: 20160421
Implantable Lead Implant Date: 20160421
Implantable Lead Implant Date: 20161006
Implantable Lead Implant Date: 20161006
Implantable Lead Location: 753858
Implantable Lead Location: 753858
Implantable Lead Location: 753859
Implantable Lead Location: 753860
Implantable Lead Model: 5071
Implantable Lead Model: 5071
Implantable Lead Model: 5076
Lead Channel Impedance Value: 323 Ohm
Lead Channel Impedance Value: 323 Ohm
Lead Channel Impedance Value: 399 Ohm
Lead Channel Impedance Value: 4047 Ohm
Lead Channel Impedance Value: 4047 Ohm
Lead Channel Impedance Value: 437 Ohm
Lead Channel Pacing Threshold Amplitude: 0.5 V
Lead Channel Pacing Threshold Amplitude: 1.25 V
Lead Channel Pacing Threshold Pulse Width: 0.4 ms
Lead Channel Pacing Threshold Pulse Width: 0.4 ms
Lead Channel Sensing Intrinsic Amplitude: 1.875 mV
Lead Channel Sensing Intrinsic Amplitude: 1.875 mV
Lead Channel Sensing Intrinsic Amplitude: 31.625 mV
Lead Channel Sensing Intrinsic Amplitude: 31.625 mV
Lead Channel Setting Pacing Amplitude: 1.5 V
Lead Channel Setting Pacing Amplitude: 2 V
Lead Channel Setting Pacing Amplitude: 2.5 V
Lead Channel Setting Pacing Pulse Width: 0.4 ms
Lead Channel Setting Pacing Pulse Width: 0.8 ms
Lead Channel Setting Sensing Sensitivity: 0.3 mV

## 2015-10-17 ENCOUNTER — Telehealth: Payer: Self-pay | Admitting: *Deleted

## 2015-10-17 NOTE — Telephone Encounter (Signed)
Heads Up

## 2015-10-17 NOTE — Telephone Encounter (Signed)
Pt dropped off FMLA papers. Papers are located up front in colored folder.

## 2015-10-17 NOTE — Telephone Encounter (Signed)
FYI Patient stated that her job would not accept her FMLA paperwork. She will drop this paperwork off today along with reasons for the rejection of the forrm. Her deadline for the paper work will be 10/31/15

## 2015-10-18 NOTE — Telephone Encounter (Signed)
Note on FMLA states to mark through the areas with Dr. Arnoldo Morale and Dr. Cephus Shelling. And initial.

## 2015-10-19 ENCOUNTER — Ambulatory Visit
Admission: RE | Admit: 2015-10-19 | Discharge: 2015-10-19 | Disposition: A | Discharge status: Home / Self Care | Payer: BLUE CROSS/BLUE SHIELD | Source: Ambulatory Visit | Attending: Neurosurgery | Admitting: Neurosurgery

## 2015-10-19 ENCOUNTER — Encounter: Payer: Self-pay | Admitting: Cardiology

## 2015-10-19 DIAGNOSIS — M5412 Radiculopathy, cervical region: Secondary | ICD-10-CM

## 2015-10-19 MED ORDER — ONDANSETRON HCL 4 MG/2ML IJ SOLN
4.0000 mg | Freq: Once | INTRAMUSCULAR | Status: AC
  Administered 2015-10-19: 4 mg via INTRAMUSCULAR

## 2015-10-19 MED ORDER — IOPAMIDOL (ISOVUE-M 300) INJECTION 61%
10.0000 mL | Freq: Once | INTRAMUSCULAR | Status: AC | PRN
  Administered 2015-10-19: 10 mL via INTRATHECAL

## 2015-10-19 MED ORDER — DIAZEPAM 5 MG PO TABS
10.0000 mg | ORAL_TABLET | Freq: Once | ORAL | Status: AC
Start: 2015-10-19 — End: 2015-10-19
  Administered 2015-10-19: 10 mg via ORAL

## 2015-10-19 MED ORDER — MEPERIDINE HCL 100 MG/ML IJ SOLN
75.0000 mg | Freq: Once | INTRAMUSCULAR | Status: AC
  Administered 2015-10-19: 75 mg via INTRAMUSCULAR

## 2015-10-19 NOTE — Discharge Instructions (Signed)
Myelogram Discharge Instructions  1. Go home and rest quietly for the next 24 hours.  It is important to lie flat for the next 24 hours.  Get up only to go to the restroom.  You may lie in the bed or on a couch on your back, your stomach, your left side or your right side.  You may have one pillow under your head.  You may have pillows between your knees while you are on your side or under your knees while you are on your back.  2. DO NOT drive today.  Recline the seat as far back as it will go, while still wearing your seat belt, on the way home.  3. You may get up to go to the bathroom as needed.  You may sit up for 10 minutes to eat.  You may resume your normal diet and medications unless otherwise indicated.  Drink lots of extra fluids today and tomorrow.  4. The incidence of headache, nausea, or vomiting is about 5% (one in 20 patients).  If you develop a headache, lie flat and drink plenty of fluids until the headache goes away.  Caffeinated beverages may be helpful.  If you develop severe nausea and vomiting or a headache that does not go away with flat bed rest, call [phone].  5. You may resume normal activities after your 24 hours of bed rest is over; however, do not exert yourself strongly or do any heavy lifting tomorrow. If when you get up you have a headache when standing, go back to bed and force fluids for another 24 hours.  6. Call your physician for a follow-up appointment.  The results of your myelogram will be sent directly to your physician by the following day.  7. If you have any questions or if complications develop after you arrive home, please call [phone].  Discharge instructions have been explained to the patient.  The patient, or the person responsible for the patient, fully understands these instructions.   May resume Cymbalta, Trazodone and Ultram on Aug. 26, 2017, after 10:30 am.

## 2015-10-22 ENCOUNTER — Other Ambulatory Visit: Payer: Self-pay | Admitting: Neurosurgery

## 2015-10-22 ENCOUNTER — Ambulatory Visit
Admission: RE | Admit: 2015-10-22 | Discharge: 2015-10-22 | Disposition: A | Discharge status: Home / Self Care | Payer: BLUE CROSS/BLUE SHIELD | Source: Ambulatory Visit | Attending: Neurosurgery | Admitting: Neurosurgery

## 2015-10-22 DIAGNOSIS — R519 Headache, unspecified: Secondary | ICD-10-CM

## 2015-10-22 DIAGNOSIS — R51 Headache: Principal | ICD-10-CM

## 2015-10-22 DIAGNOSIS — G971 Other reaction to spinal and lumbar puncture: Secondary | ICD-10-CM | POA: Insufficient documentation

## 2015-10-22 MED ORDER — IOPAMIDOL (ISOVUE-M 200) INJECTION 41%
1.0000 mL | Freq: Once | INTRAMUSCULAR | Status: AC
  Administered 2015-10-22: 1 mL via EPIDURAL

## 2015-10-22 NOTE — Progress Notes (Signed)
20cc blood drawn from left AC space for blood patch; site unremarkable.  jkl

## 2015-10-22 NOTE — Progress Notes (Signed)
20 cc's of blood drawn from left [facility] for blood patch. Pt tolerated procedure well and site is unremarkable.

## 2015-10-22 NOTE — Telephone Encounter (Signed)
Returned to [place].  Revisions made.  No charge

## 2015-10-22 NOTE — Telephone Encounter (Signed)
Copy made for chart and patient notified forms ready for pick up and placed at front for pick up.

## 2015-10-22 NOTE — Discharge Instructions (Addendum)

## 2015-10-24 ENCOUNTER — Telehealth: Payer: Self-pay

## 2015-10-24 NOTE — Telephone Encounter (Signed)
After leaving message on home phone, I spoke with Suzanne Spears at work on her cell phone.  She states her spinal headache is gone but that her back is sore from the epidural blood patch and several days of strict bedrest after her LP here 10/19/15 and EBP here 10/22/15.  She states she is alternating applying heat and ice to her back, with relief of aches and soreness.  She is back at work today and thanks [name] for all we did and for being so kind. jkl

## 2015-11-05 ENCOUNTER — Other Ambulatory Visit: Payer: Self-pay | Admitting: Internal Medicine

## 2015-11-09 ENCOUNTER — Ambulatory Visit: Payer: BLUE CROSS/BLUE SHIELD | Admitting: Internal Medicine

## 2015-11-22 ENCOUNTER — Ambulatory Visit: Payer: BLUE CROSS/BLUE SHIELD | Admitting: Internal Medicine

## 2015-12-17 ENCOUNTER — Ambulatory Visit (INDEPENDENT_AMBULATORY_CARE_PROVIDER_SITE_OTHER): Payer: BLUE CROSS/BLUE SHIELD | Admitting: Internal Medicine

## 2015-12-17 ENCOUNTER — Encounter: Payer: Self-pay | Admitting: Internal Medicine

## 2015-12-17 VITALS — BP 110/78 | HR 83 | Ht 65.5 in | Wt 172.0 lb

## 2015-12-17 DIAGNOSIS — Z23 Encounter for immunization: Secondary | ICD-10-CM | POA: Diagnosis not present

## 2015-12-17 DIAGNOSIS — J4520 Mild intermittent asthma, uncomplicated: Secondary | ICD-10-CM | POA: Diagnosis not present

## 2015-12-17 NOTE — Progress Notes (Signed)
[facility] Consultation      MRN# [place]:[id number] Suzanne Spears May 07, 1960   CC: Chief Complaint  Patient presents with  . Follow-up    Asthma: NP cough, chest tightness at times; SOB same      Brief History: 55 year old female seen in consultation as transition of care from Dr. Raul Del office for mild to moderate asthma, previously saw [facility], then transferred to [facility], now back to [facility]. Medical history significant for left bundle branch block, mitral prolapse, dilated cardiomyopathy, asthma, hypertension, chronic systolic congestive heart failure, status post ICD placement, hx of pneumonia, which are all stable. Work allergens may aggravate cough, follow with ENT for nasal septum issues.    Events since last clinic visit: Patient presents today for follow-up visit of mild intermittent asthma. On  Arnuity, and since then has had significant improvement in her cough and overall breathing. Did will with ICS in spring and early summer.  Seems to have a seasonal component to her asthma.  Overall with significant improvement since starting ICS. No fevers, no chills, no nausea, no vomiting, no diarrhea, no worsening dyspnea on exertion.     Medication:    Current Outpatient Prescriptions:  .  albuterol (PROVENTIL HFA;VENTOLIN HFA) 108 (90 BASE) MCG/ACT inhaler, Inhale 2 puffs into the lungs daily as needed for wheezing or shortness of breath., Disp: , Rfl:  .  cyanocobalamin (,VITAMIN B-12,) 1000 MCG/ML injection, INJECT 1 ML INTO THE MUSCLE ONCE A WEEK, Disp: 10 mL, Rfl: 5 .  cyclobenzaprine (FLEXERIL) 10 MG tablet, Take 10 mg by mouth 3 (three) times daily as needed for muscle spasms., Disp: , Rfl:  .  DULoxetine (CYMBALTA) 60 MG capsule, TAKE (1) CAPSULE BY MOUTH EVERY DAY, Disp: 30 capsule, Rfl: 3 .  fexofenadine (ALLEGRA) 180 MG tablet, Take 180 mg by mouth at bedtime., Disp: , Rfl:  .  fluticasone (FLONASE) 50 MCG/ACT nasal spray, USE 2 SPRAYS INTO BOTH  NOSTRILS ONCE DAILY AS DIRECTED BY PHYSICIAN., Disp: 48 g, Rfl: 1 .  Fluticasone Furoate (ARNUITY ELLIPTA) 100 MCG/ACT AEPB, Inhale 1 puff into the lungs daily., Disp: 30 each, Rfl: 4 .  JUNEL FE 1/20 1-20 MG-MCG tablet, TAKE (1) TABLET BY MOUTH EVERY DAY, Disp: 28 Package, Rfl: 3 .  ketorolac (TORADOL) 10 MG tablet, Take 1 tablet (10 mg total) by mouth every 6 (six) hours as needed., Disp: 20 tablet, Rfl: 0 .  losartan (COZAAR) 25 MG tablet, TAKE (1) TABLET BY MOUTH EVERY DAY, Disp: 30 tablet, Rfl: 5 .  magnesium 30 MG tablet, Take 30 mg by mouth daily., Disp: , Rfl:  .  Melatonin 3 MG TABS, Take 3 mg by mouth at bedtime as needed. Reported on 08/20/2015, Disp: , Rfl:  .  montelukast (SINGULAIR) 10 MG tablet, TAKE (1) TABLET BY MOUTH EVERY DAY, Disp: 90 tablet, Rfl: 1 .  oxyCODONE (OXY IR/ROXICODONE) 5 MG immediate release tablet, Take 1-2 tablets (5-10 mg total) by mouth every 6 (six) hours as needed for severe pain., Disp: 30 tablet, Rfl: 0 .  spironolactone (ALDACTONE) 25 MG tablet, TAKE ONE (1) TABLET BY MOUTH ONCE DAILY, Disp: 30 tablet, Rfl: 3 .  topiramate (TOPAMAX) 50 MG tablet, Take 1 tablet (50 mg total) by mouth at bedtime. (Patient taking differently: Take 100 mg by mouth at bedtime. ), Disp: 30 tablet, Rfl: 1 .  traMADol (ULTRAM) 50 MG tablet, Take 1 tablet (50 mg total) by mouth every 6 (six) hours as needed., Disp: 90 tablet, Rfl:  2 .  traZODone (DESYREL) 50 MG tablet, TAKE 1/2-1 TABLET BY MOUTH AT BEDTIME ASNEEDED FOR SLEEP, Disp: 30 tablet, Rfl: 5 .  valACYclovir (VALTREX) 1000 MG tablet, Take 1 tablet (1,000 mg total) by mouth 2 (two) times daily. (Patient taking differently: Take 1,000 mg by mouth 2 (two) times daily as needed (fever blisters). ), Disp: 20 tablet, Rfl: 0    Review of Systems  Constitutional: Negative for chills and fever.  Eyes: Negative for blurred vision and double vision.  Respiratory: Positive for cough.        Mild intermittent cough  Cardiovascular:  Negative for chest pain.  Gastrointestinal: Negative for heartburn and nausea.  Genitourinary: Negative for dysuria.  Musculoskeletal: Negative for myalgias.  Neurological: Negative for dizziness and headaches.      Allergies:  Adhesive [tape]; Coreg [carvedilol]; Metoprolol; Penicillin g; Latex; and Levofloxacin  Physical Examination:  VS: BP 110/78 (BP Location: Left Arm, Cuff Size: Normal)   Pulse 83   Ht 5' 5.5" (1.664 m)   Wt 172 lb (78 kg)   SpO2 99%   BMI 28.19 kg/m   General Appearance: No distress  HEENT: PERRLA, no ptosis, no other lesions noticed Pulmonary:normal breath sounds., diaphragmatic excursion normal.No wheezing, No rales   Cardiovascular:  Normal S1,S2.  No m/r/g.     Abdomen:Exam: Benign, Soft, non-tender, No masses  Skin:   warm, no rashes, no ecchymosis  Extremities: normal, no cyanosis, clubbing, warm with normal capillary refill.      07/09/15 PFTs - FEV1 80, FEV1/FVC 72, FEF 25-70 562, DLCO 91%. No significant obstruction,recurrent bronchodilator response, DLCO uncorrected within normal limits, normal curves. 6 minute walk test-no significant desaturations, lowest desaturation 96%, 1181 feet, 360 m    Assessment and Plan: 55 year old seen in follow-up for asthma Asthma, chronic Previously seen by [facility], Dr. Lake Bells, and most recently seen by [facility] pulmonary Dr. Vella Kohler. She has transition care back to [facility] pulmonary. Her asthma is mild intermittent, it seems this is more seasonal and may have a work-related component to it. I'm not totally convinced that she needs to be on a year-round maintenance inhaler. But she does seem to have triggers especially in the winter and early spring season, maybe during these time she should be on a maintenance inhaler. The above was discussed with the patient, and she is in agreement with. She is currently on Arnuity with great improvement, therefore we will continue with this.  Pfts are essentially  normal, with moderate decrease in FEF 25-75 62% (FEV1 80%, FEV1/FVC 72%) Normal 38mwt - no desats, walked 1160ft/360m   Off note, she claims that she has allergies to prednisone, but her descriptions are more in line with side effects of high-dose steroids.  I have discussed with her in the future if she does need prednisone, we can start off at lower dose, she will consider this when that time comes.  We plan for full pulmonary function testing, 6 minute walk test, and maintenance inhaler during fall winter and early spring.  Plan: -Arunity 122mcg - 1 puff daily, -gargle and rinse after each use.  -Avoid noxious substances such as smoke, perfumes, and other sick contacts. -Continue with outpatient allergy regiment.      Updated Medication List Outpatient Encounter Prescriptions as of 12/17/2015  Medication Sig  . albuterol (PROVENTIL HFA;VENTOLIN HFA) 108 (90 BASE) MCG/ACT inhaler Inhale 2 puffs into the lungs daily as needed for wheezing or shortness of breath.  . cyanocobalamin (,VITAMIN B-12,) 1000 MCG/ML injection  INJECT 1 ML INTO THE MUSCLE ONCE A WEEK  . cyclobenzaprine (FLEXERIL) 10 MG tablet Take 10 mg by mouth 3 (three) times daily as needed for muscle spasms.  . DULoxetine (CYMBALTA) 60 MG capsule TAKE (1) CAPSULE BY MOUTH EVERY DAY  . fexofenadine (ALLEGRA) 180 MG tablet Take 180 mg by mouth at bedtime.  . fluticasone (FLONASE) 50 MCG/ACT nasal spray USE 2 SPRAYS INTO BOTH NOSTRILS ONCE DAILY AS DIRECTED BY PHYSICIAN.  Marland Kitchen Fluticasone Furoate (ARNUITY ELLIPTA) 100 MCG/ACT AEPB Inhale 1 puff into the lungs daily.  Lenda Kelp FE 1/20 1-20 MG-MCG tablet TAKE (1) TABLET BY MOUTH EVERY DAY  . ketorolac (TORADOL) 10 MG tablet Take 1 tablet (10 mg total) by mouth every 6 (six) hours as needed.  Marland Kitchen losartan (COZAAR) 25 MG tablet TAKE (1) TABLET BY MOUTH EVERY DAY  . magnesium 30 MG tablet Take 30 mg by mouth daily.  . Melatonin 3 MG TABS Take 3 mg by mouth at bedtime as needed. Reported  on 08/20/2015  . montelukast (SINGULAIR) 10 MG tablet TAKE (1) TABLET BY MOUTH EVERY DAY  . oxyCODONE (OXY IR/ROXICODONE) 5 MG immediate release tablet Take 1-2 tablets (5-10 mg total) by mouth every 6 (six) hours as needed for severe pain.  Marland Kitchen spironolactone (ALDACTONE) 25 MG tablet TAKE ONE (1) TABLET BY MOUTH ONCE DAILY  . topiramate (TOPAMAX) 50 MG tablet Take 1 tablet (50 mg total) by mouth at bedtime. (Patient taking differently: Take 100 mg by mouth at bedtime. )  . traMADol (ULTRAM) 50 MG tablet Take 1 tablet (50 mg total) by mouth every 6 (six) hours as needed.  . traZODone (DESYREL) 50 MG tablet TAKE 1/2-1 TABLET BY MOUTH AT BEDTIME ASNEEDED FOR SLEEP  . valACYclovir (VALTREX) 1000 MG tablet Take 1 tablet (1,000 mg total) by mouth 2 (two) times daily. (Patient taking differently: Take 1,000 mg by mouth 2 (two) times daily as needed (fever blisters). )   No facility-administered encounter medications on file as of 12/17/2015.     Orders for this visit: Orders Placed This Encounter  Procedures  . Flu Vaccine QUAD 36+ mos IM    Thank  you for the visitation and for allowing  [facility] to assist in the care of your patient. Our recommendations are noted above.  Please contact us if we can be of further service.  Vilinda Boehringer, MD [facility] Office Number: [phone]  Note: This note was prepared with Dragon dictation along with smaller phrase technology. Any transcriptional errors that result from this process are unintentional.

## 2015-12-17 NOTE — Patient Instructions (Signed)
Follow up with Dr. Mortimer Fries in 52months - cont with current inhaler - flu shot today - avoid allergens.

## 2015-12-17 NOTE — Assessment & Plan Note (Signed)
Previously seen by [facility] pulmonary, Dr. Lake Bells, and most recently seen by [facility] pulmonary Dr. Vella Kohler. She has transition care back to [facility] pulmonary. Her asthma is mild intermittent, it seems this is more seasonal and may have a work-related component to it. I'm not totally convinced that she needs to be on a year-round maintenance inhaler. But she does seem to have triggers especially in the winter and early spring season, maybe during these time she should be on a maintenance inhaler. The above was discussed with the patient, and she is in agreement with. She is currently on Arnuity with great improvement, therefore we will continue with this.  Pfts are essentially normal, with moderate decrease in FEF 25-75 62% (FEV1 80%, FEV1/FVC 72%) Normal 34mwt - no desats, walked 1185ft/360m   Off note, she claims that she has allergies to prednisone, but her descriptions are more in line with side effects of high-dose steroids.  I have discussed with her in the future if she does need prednisone, we can start off at lower dose, she will consider this when that time comes.  We plan for full pulmonary function testing, 6 minute walk test, and maintenance inhaler during fall winter and early spring.  Plan: -Arunity 159mcg - 1 puff daily, -gargle and rinse after each use.  -Avoid noxious substances such as smoke, perfumes, and other sick contacts. -Continue with outpatient allergy regiment.

## 2016-01-01 ENCOUNTER — Other Ambulatory Visit: Payer: Self-pay | Admitting: *Deleted

## 2016-01-01 MED ORDER — VALACYCLOVIR HCL 1 G PO TABS: 1000.0000 mg | ORAL_TABLET | Freq: Two times a day (BID) | ORAL | 3 refills | Status: DC | PRN

## 2016-01-10 ENCOUNTER — Other Ambulatory Visit: Payer: Self-pay | Admitting: Internal Medicine

## 2016-01-11 DIAGNOSIS — M25511 Pain in right shoulder: Secondary | ICD-10-CM | POA: Insufficient documentation

## 2016-01-16 ENCOUNTER — Other Ambulatory Visit: Payer: Self-pay | Admitting: Internal Medicine

## 2016-01-22 ENCOUNTER — Encounter: Payer: BLUE CROSS/BLUE SHIELD | Admitting: Physician Assistant

## 2016-01-29 ENCOUNTER — Ambulatory Visit (INDEPENDENT_AMBULATORY_CARE_PROVIDER_SITE_OTHER): Payer: BLUE CROSS/BLUE SHIELD | Admitting: Internal Medicine

## 2016-01-29 ENCOUNTER — Encounter: Payer: Self-pay | Admitting: Internal Medicine

## 2016-01-29 VITALS — BP 130/80 | HR 79 | Ht 65.5 in | Wt 174.2 lb

## 2016-01-29 DIAGNOSIS — I429 Cardiomyopathy, unspecified: Secondary | ICD-10-CM

## 2016-01-29 DIAGNOSIS — Z95810 Presence of automatic (implantable) cardiac defibrillator: Secondary | ICD-10-CM

## 2016-01-29 DIAGNOSIS — I341 Nonrheumatic mitral (valve) prolapse: Secondary | ICD-10-CM | POA: Diagnosis not present

## 2016-01-29 DIAGNOSIS — I5022 Chronic systolic (congestive) heart failure: Secondary | ICD-10-CM

## 2016-01-29 DIAGNOSIS — I420 Dilated cardiomyopathy: Secondary | ICD-10-CM

## 2016-01-29 DIAGNOSIS — I447 Left bundle-branch block, unspecified: Secondary | ICD-10-CM

## 2016-01-29 DIAGNOSIS — R0609 Other forms of dyspnea: Secondary | ICD-10-CM
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
## 2016-01-29 NOTE — Patient Instructions (Signed)
Medication Instructions: - Your physician has recommended you make the following change in your medication:  1) Stop aldactone (spironolactone)  Labwork: - none ordered  Procedures/Testing: - Your physician has recommended that you have an AV optimization echo- [employer] (on a day Dr. Caryl Comes is in the office) . During this procedure, an echocardiogram is performed to optimize the timing of your device using ultrasound and a device programmer. Changes will be made to the device settings to help the heart chambers pump more efficiently. This procedure takes approximately one hour.  - Your physician has recommended that you have a cardiopulmonary stress test (CPX)- in [facility]. CPX testing is a non-invasive measurement of heart and lung function. It replaces a traditional treadmill stress test. This type of test provides a tremendous amount of information that relates not only to your present condition but also for future outcomes. This test combines measurements of you ventilation, respiratory gas exchange in the lungs, electrocardiogram (EKG), blood pressure and physical response before, during, and following an exercise protocol.  Follow-Up: - Remote monitoring is used to monitor your Pacemaker of ICD from home. This monitoring reduces the number of office visits required to check your device to one time per year. It allows [name] to keep an eye on the functioning of your device to ensure it is working properly. You are scheduled for a device check from home on 04/29/16. You may send your transmission at any time that day. If you have a wireless device, the transmission will be sent automatically. After your physician reviews your transmission, you will receive a postcard with your next transmission date.  - Your physician wants you to follow-up in: 6 months with Dr. Caryl Comes. You will receive a reminder letter in the mail two months in advance. If you don't receive a letter, please call our office to  schedule the follow-up appointment.  Any Additional Special Instructions Will Be Listed Below (If Applicable).     If you need a refill on your cardiac medications before your next appointment, please call your pharmacy.

## 2016-01-31 ENCOUNTER — Encounter: Payer: BLUE CROSS/BLUE SHIELD | Admitting: Internal Medicine

## 2016-02-04 IMAGING — CR DG CHEST 2V
2 series · 2 of 2 positions shown · non-contrast
Comparison: Comparison made to prior chest x-ray of 12/20/2014 and
prior chest CT of 12/02/2014.

CLINICAL DATA: Pleurisy.  Effusion.

EXAM:
CHEST  2 VIEW

[w chest pa]
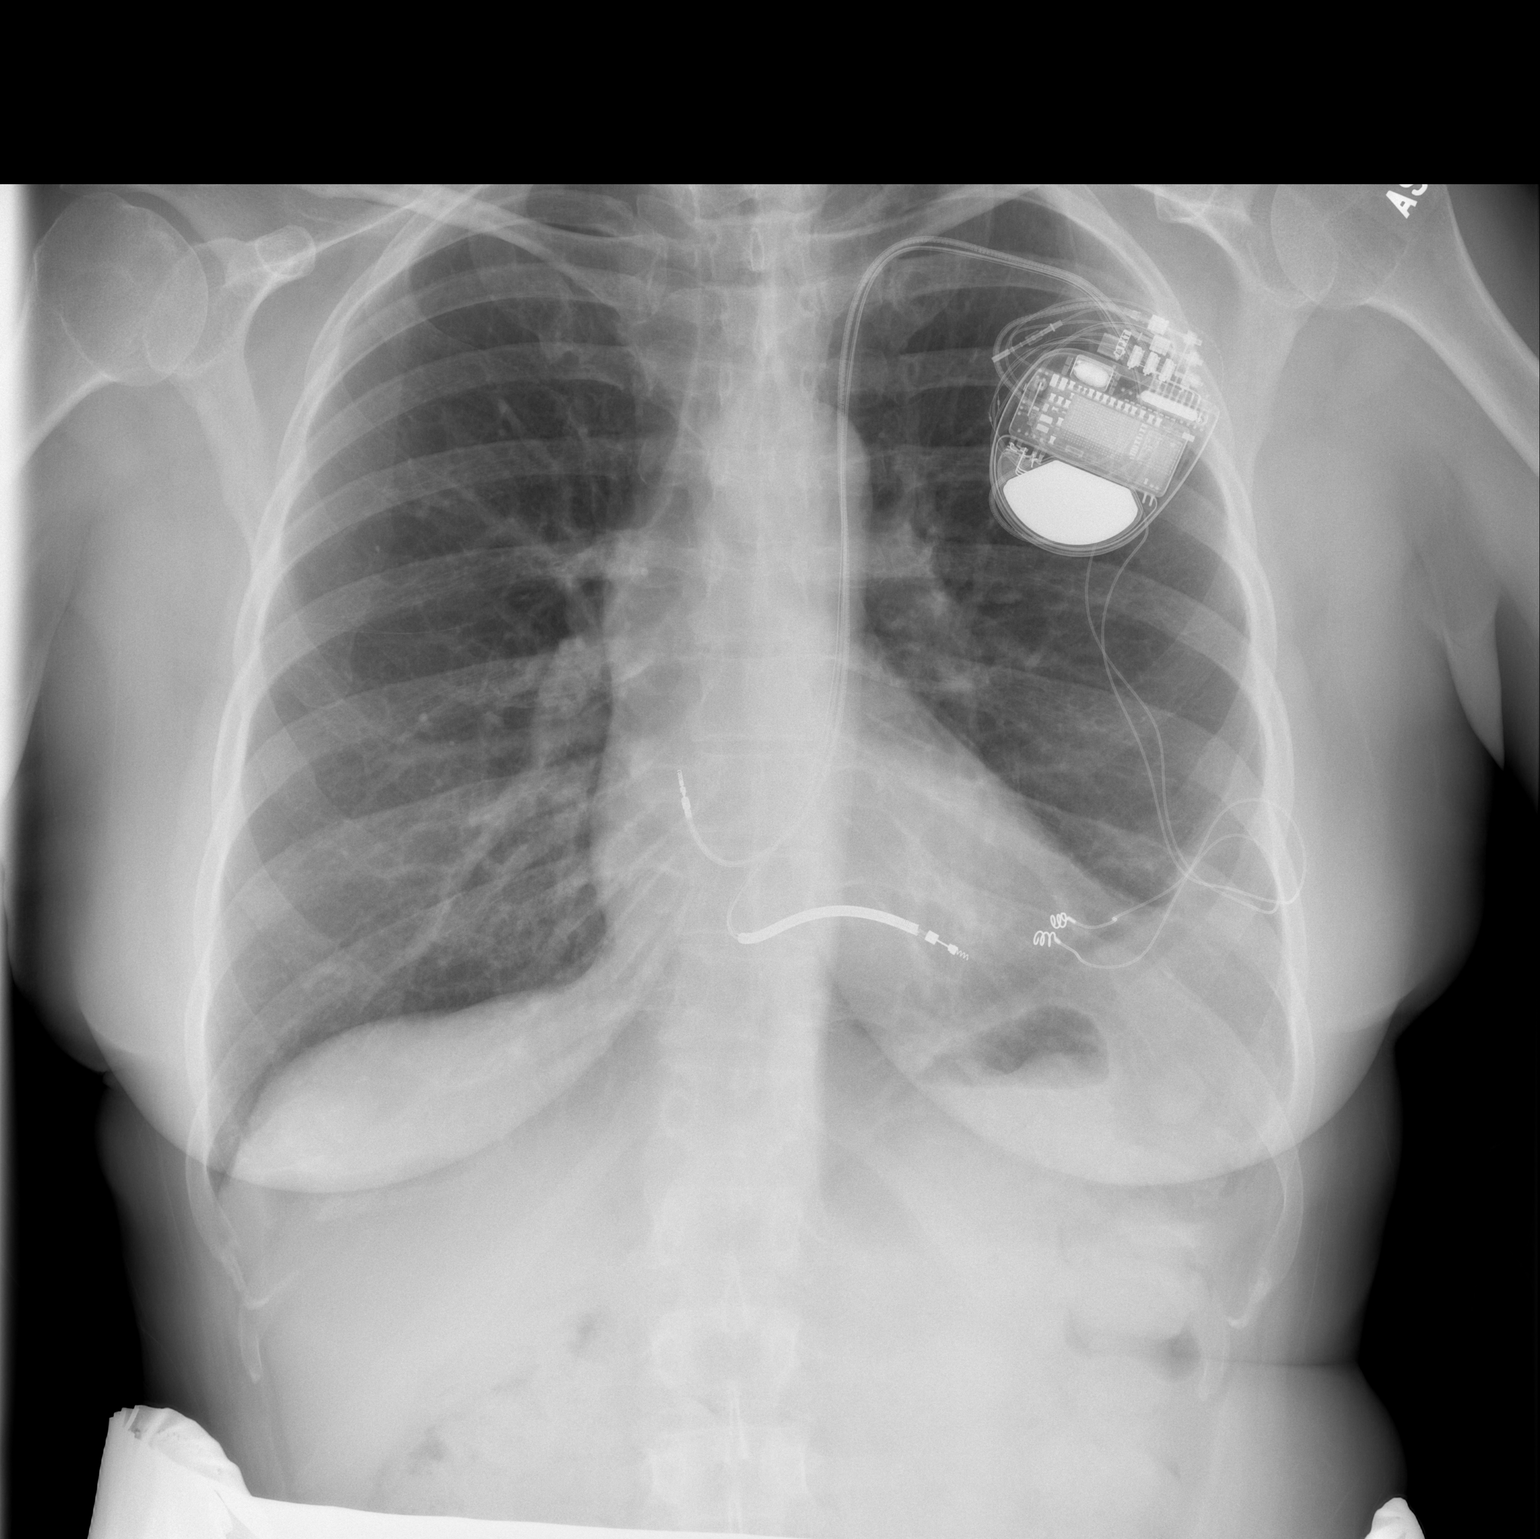

[w chest lat]
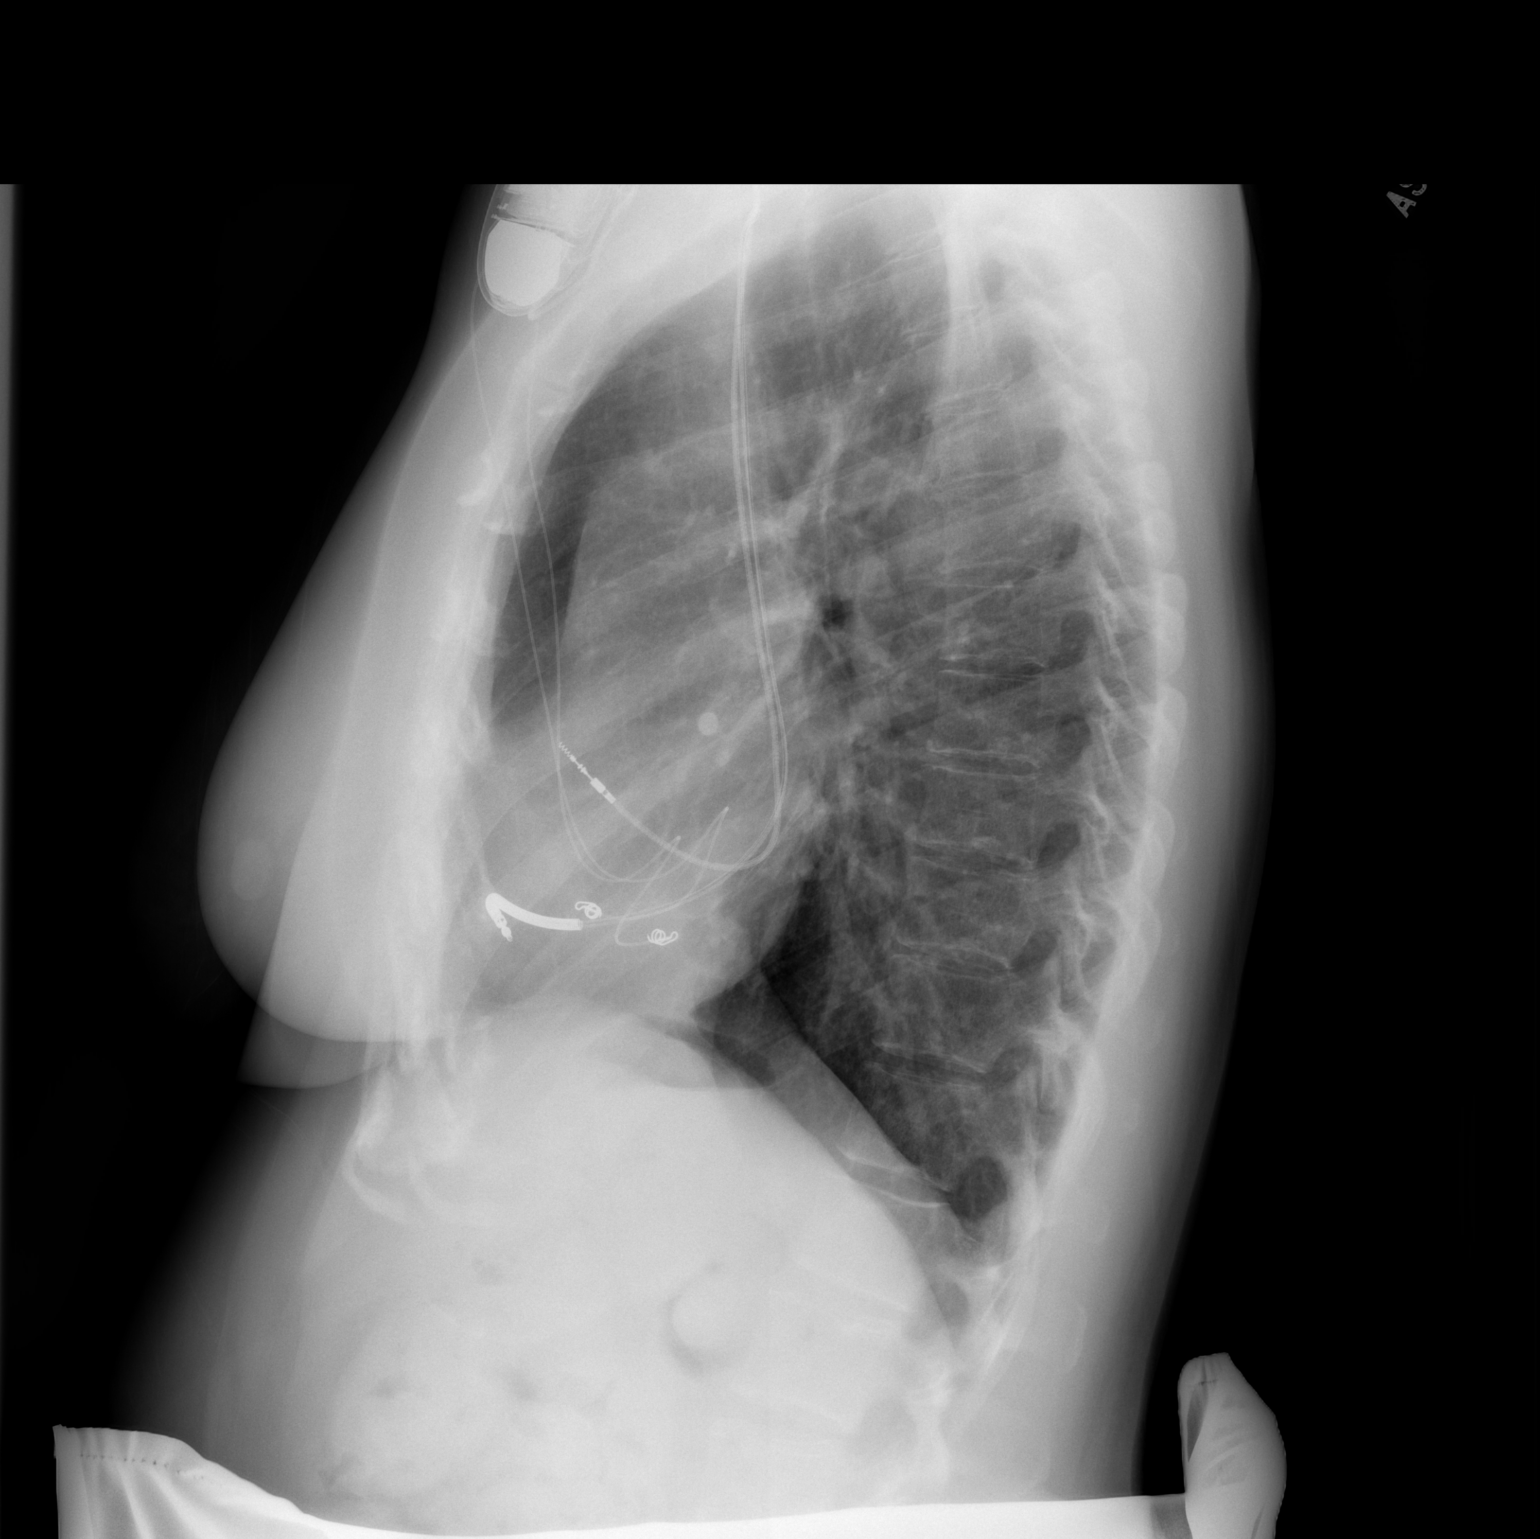

[2 of 2 positions shown; findings below may reference images not displayed]

FINDINGS: Mediastinum and hilar structures are unremarkable. Heart size
normal. Left-sided pacer noted lead tips in stable position. Stable
left base subsegmental atelectasis and or mild infiltrate stable
small left pleural effusion. No pneumothorax. Prior cervical spine
fusion .
IMPRESSION: Stable left base subsegmental atelectasis and or mild infiltrate
stable small left pleural effusion.

## 2016-02-05 ENCOUNTER — Ambulatory Visit (INDEPENDENT_AMBULATORY_CARE_PROVIDER_SITE_OTHER): Payer: BLUE CROSS/BLUE SHIELD

## 2016-02-05 ENCOUNTER — Telehealth: Payer: BLUE CROSS/BLUE SHIELD | Admitting: Family

## 2016-02-05 ENCOUNTER — Encounter: Payer: Self-pay | Admitting: Internal Medicine

## 2016-02-05 ENCOUNTER — Other Ambulatory Visit: Payer: Self-pay

## 2016-02-05 DIAGNOSIS — R0609 Other forms of dyspnea: Secondary | ICD-10-CM | POA: Diagnosis not present

## 2016-02-05 DIAGNOSIS — J329 Chronic sinusitis, unspecified: Secondary | ICD-10-CM

## 2016-02-05 DIAGNOSIS — B9689 Other specified bacterial agents as the cause of diseases classified elsewhere: Secondary | ICD-10-CM

## 2016-02-05 MED ORDER — DOXYCYCLINE HYCLATE 100 MG PO TABS
100.0000 mg | ORAL_TABLET | Freq: Two times a day (BID) | ORAL | 0 refills | Status: DC
Start: 2016-02-05 — End: 2016-03-21

## 2016-02-05 NOTE — Progress Notes (Signed)

## 2016-02-07 ENCOUNTER — Telehealth: Payer: Self-pay | Admitting: Internal Medicine

## 2016-02-07 DIAGNOSIS — G5600 Carpal tunnel syndrome, unspecified upper limb: Secondary | ICD-10-CM | POA: Insufficient documentation

## 2016-02-07 DIAGNOSIS — G5601 Carpal tunnel syndrome, right upper limb: Secondary | ICD-10-CM

## 2016-02-11 ENCOUNTER — Other Ambulatory Visit: Payer: Self-pay | Admitting: Internal Medicine

## 2016-02-22 ENCOUNTER — Ambulatory Visit (HOSPITAL_COMMUNITY): Payer: BLUE CROSS/BLUE SHIELD | Attending: Internal Medicine

## 2016-02-22 DIAGNOSIS — R0609 Other forms of dyspnea: Secondary | ICD-10-CM

## 2016-02-24 ENCOUNTER — Other Ambulatory Visit: Payer: Self-pay

## 2016-02-24 ENCOUNTER — Emergency Department (HOSPITAL_COMMUNITY): Payer: BLUE CROSS/BLUE SHIELD

## 2016-02-24 ENCOUNTER — Emergency Department (HOSPITAL_COMMUNITY)
Admission: EM | Admit: 2016-02-24 | Discharge: 2016-02-25 | Disposition: A | Discharge status: Home / Self Care | Payer: BLUE CROSS/BLUE SHIELD | Attending: Emergency Medicine | Admitting: Emergency Medicine

## 2016-02-24 ENCOUNTER — Encounter (HOSPITAL_COMMUNITY): Payer: Self-pay | Admitting: Emergency Medicine

## 2016-02-24 DIAGNOSIS — Z95810 Presence of automatic (implantable) cardiac defibrillator: Secondary | ICD-10-CM | POA: Diagnosis not present

## 2016-02-24 DIAGNOSIS — I110 Hypertensive heart disease with heart failure: Secondary | ICD-10-CM | POA: Diagnosis not present

## 2016-02-24 DIAGNOSIS — I5022 Chronic systolic (congestive) heart failure: Secondary | ICD-10-CM | POA: Insufficient documentation

## 2016-02-24 DIAGNOSIS — J45909 Unspecified asthma, uncomplicated: Secondary | ICD-10-CM | POA: Diagnosis not present

## 2016-02-24 DIAGNOSIS — Z91040 Latex allergy status: Secondary | ICD-10-CM | POA: Diagnosis not present

## 2016-02-24 DIAGNOSIS — R079 Chest pain, unspecified: Secondary | ICD-10-CM | POA: Diagnosis not present

## 2016-02-24 DIAGNOSIS — I2510 Atherosclerotic heart disease of native coronary artery without angina pectoris: Secondary | ICD-10-CM | POA: Insufficient documentation

## 2016-02-24 DIAGNOSIS — R059 Cough, unspecified: Secondary | ICD-10-CM

## 2016-02-24 DIAGNOSIS — R05 Cough: Secondary | ICD-10-CM | POA: Insufficient documentation

## 2016-02-24 DIAGNOSIS — Z79899 Other long term (current) drug therapy: Secondary | ICD-10-CM | POA: Insufficient documentation

## 2016-02-24 LAB — CBC
HCT: 39.4 % (ref 36.0–46.0)
Hemoglobin: 13.2 g/dL (ref 12.0–15.0)
MCH: 31.1 pg (ref 26.0–34.0)
MCHC: 33.5 g/dL (ref 30.0–36.0)
MCV: 92.9 fL (ref 78.0–100.0)
Platelets: 227 10*3/uL (ref 150–400)
RBC: 4.24 MIL/uL (ref 3.87–5.11)
RDW: 13.8 % (ref 11.5–15.5)
WBC: 8.9 10*3/uL (ref 4.0–10.5)

## 2016-02-24 LAB — BASIC METABOLIC PANEL
Anion gap: 9 (ref 5–15)
BUN: 13 mg/dL (ref 6–20)
CO2: 19 mmol/L — ABNORMAL LOW (ref 22–32)
Calcium: 9.1 mg/dL (ref 8.9–10.3)
Chloride: 109 mmol/L (ref 101–111)
Creatinine, Ser: 1.16 mg/dL — ABNORMAL HIGH (ref 0.44–1.00)
GFR calc Af Amer: >60 mL/min (ref >60)
GFR calc non Af Amer: 52 mL/min — ABNORMAL LOW (ref >60)
Glucose, Bld: 96 mg/dL (ref 65–99)
Potassium: 4.3 mmol/L (ref 3.5–5.1)
Sodium: 137 mmol/L (ref 135–145)

## 2016-02-24 LAB — BRAIN NATRIURETIC PEPTIDE: B Natriuretic Peptide: 73.5 pg/mL (ref 0.0–100.0)

## 2016-02-24 LAB — I-STAT TROPONIN, ED: Troponin i, poc: 0.02 ng/mL (ref 0.00–0.08)

## 2016-02-24 MED ORDER — IPRATROPIUM-ALBUTEROL 0.5-2.5 (3) MG/3ML IN SOLN
3.0000 mL | Freq: Once | RESPIRATORY_TRACT | Status: AC
  Administered 2016-02-24: 3 mL via RESPIRATORY_TRACT
  Filled 2016-02-24: qty 3

## 2016-02-24 MED ORDER — IBUPROFEN 400 MG PO TABS
600.0000 mg | ORAL_TABLET | Freq: Once | ORAL | Status: AC
  Administered 2016-02-24: 600 mg via ORAL
  Filled 2016-02-24: qty 1

## 2016-02-24 MED ORDER — BENZONATATE 100 MG PO CAPS
200.0000 mg | ORAL_CAPSULE | Freq: Once | ORAL | Status: AC
  Administered 2016-02-24: 200 mg via ORAL
  Filled 2016-02-24: qty 2

## 2016-02-24 NOTE — ED Triage Notes (Signed)
Patient reports central chest pain " heaviness" with SOB , productive cough and chest congestion onset this week , denies nausea or diaphoresis , no fever or chills , her cardiologist is Dr. Lequita Halt .
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
## 2016-02-25 ENCOUNTER — Other Ambulatory Visit: Payer: Self-pay

## 2016-02-25 LAB — I-STAT TROPONIN, ED: Troponin i, poc: 0 ng/mL (ref 0.00–0.08)

## 2016-02-25 MED ORDER — BENZONATATE 100 MG PO CAPS: 100.0000 mg | ORAL_CAPSULE | Freq: Three times a day (TID) | ORAL | 0 refills | Status: DC | PRN

## 2016-03-07 ENCOUNTER — Encounter: Payer: Self-pay | Admitting: Emergency Medicine

## 2016-03-07 ENCOUNTER — Emergency Department
Admission: EM | Admit: 2016-03-07 | Discharge: 2016-03-07 | Disposition: A | Discharge status: Home / Self Care | Payer: BLUE CROSS/BLUE SHIELD | Attending: Emergency Medicine | Admitting: Emergency Medicine

## 2016-03-07 ENCOUNTER — Emergency Department: Payer: BLUE CROSS/BLUE SHIELD

## 2016-03-07 DIAGNOSIS — M25572 Pain in left ankle and joints of left foot: Secondary | ICD-10-CM | POA: Insufficient documentation

## 2016-03-07 DIAGNOSIS — J45909 Unspecified asthma, uncomplicated: Secondary | ICD-10-CM | POA: Diagnosis not present

## 2016-03-07 DIAGNOSIS — Z95810 Presence of automatic (implantable) cardiac defibrillator: Secondary | ICD-10-CM | POA: Insufficient documentation

## 2016-03-07 DIAGNOSIS — I110 Hypertensive heart disease with heart failure: Secondary | ICD-10-CM | POA: Insufficient documentation

## 2016-03-07 DIAGNOSIS — I5022 Chronic systolic (congestive) heart failure: Secondary | ICD-10-CM | POA: Insufficient documentation

## 2016-03-07 DIAGNOSIS — M7989 Other specified soft tissue disorders: Secondary | ICD-10-CM | POA: Diagnosis present

## 2016-03-07 DIAGNOSIS — I2510 Atherosclerotic heart disease of native coronary artery without angina pectoris: Secondary | ICD-10-CM | POA: Insufficient documentation

## 2016-03-07 DIAGNOSIS — Z79899 Other long term (current) drug therapy: Secondary | ICD-10-CM | POA: Insufficient documentation

## 2016-03-07 MED ORDER — TRAMADOL HCL 50 MG PO TABS
50.0000 mg | ORAL_TABLET | Freq: Once | ORAL | Status: AC
  Administered 2016-03-07: 50 mg via ORAL
  Filled 2016-03-07: qty 1

## 2016-03-07 MED ORDER — KETOROLAC TROMETHAMINE 60 MG/2ML IM SOLN
30.0000 mg | Freq: Once | INTRAMUSCULAR | Status: AC
  Administered 2016-03-07: 30 mg via INTRAMUSCULAR
  Filled 2016-03-07: qty 2

## 2016-03-07 MED ORDER — TRAMADOL HCL 50 MG PO TABS: 50.0000 mg | ORAL_TABLET | Freq: Four times a day (QID) | ORAL | 0 refills | Status: DC | PRN

## 2016-03-07 MED ORDER — ETODOLAC 500 MG PO TABS: 500.0000 mg | ORAL_TABLET | Freq: Two times a day (BID) | ORAL | 0 refills | Status: DC

## 2016-03-07 NOTE — Discharge Instructions (Signed)
Please rest ice and elevate left ankle. Wear Ace wrap as needed for comfort. Take etodolac as needed for pain and follow-up with primary care physician or vascular physician. Return to the ER for any increase in pain, swelling, redness or any urgent changes in her health.

## 2016-03-07 NOTE — ED Triage Notes (Signed)
Pt to triage via [place], reports pain to left lower leg x 1 day, reports worse with walking, denies much increased swelling, reports tender to palpation, reports hx of CHF, concerned about dvt
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
## 2016-03-17 ENCOUNTER — Other Ambulatory Visit: Payer: Self-pay | Admitting: Internal Medicine

## 2016-03-20 ENCOUNTER — Other Ambulatory Visit: Payer: Self-pay | Admitting: Internal Medicine

## 2016-03-20 ENCOUNTER — Ambulatory Visit
Admission: RE | Admit: 2016-03-20 | Discharge: 2016-03-20 | Disposition: A | Discharge status: Home / Self Care | Payer: BLUE CROSS/BLUE SHIELD | Source: Ambulatory Visit | Attending: Internal Medicine | Admitting: Internal Medicine

## 2016-03-20 ENCOUNTER — Encounter: Payer: Self-pay | Admitting: Internal Medicine

## 2016-03-20 ENCOUNTER — Telehealth: Payer: Self-pay | Admitting: *Deleted

## 2016-03-20 DIAGNOSIS — M7989 Other specified soft tissue disorders: Secondary | ICD-10-CM

## 2016-03-20 DIAGNOSIS — M25521 Pain in right elbow: Secondary | ICD-10-CM | POA: Diagnosis not present

## 2016-03-20 NOTE — Telephone Encounter (Signed)
Pt put on schedule for today at [facility]. Pt is aware and stated that she is on her way.

## 2016-03-20 NOTE — Telephone Encounter (Signed)
Pain in right arm and patient says is extremely sore and red, patient stated the pain is on under side of arm between elbow and shoulder area.. Patient stated has hurt off and on a couple of weeks and now the pain has been constant for last 24 hours. Some swelling in area 24 hours ago but not now. Patient refused any other physician.

## 2016-03-20 NOTE — Telephone Encounter (Signed)
Stat ultrasoudn /doppler ordered for today,  Keep appt tomorrow

## 2016-03-20 NOTE — Telephone Encounter (Signed)
Patient notified and appointment scheduled. 

## 2016-03-20 NOTE — Telephone Encounter (Signed)
Suzanne Spears form [name] stated patient Right upper arm DVT negative

## 2016-03-20 NOTE — Telephone Encounter (Signed)
Pt has requested to have a call in reference to her right arm pain/no other symptoms to report.  She would like to be seen today.   Please give a time and date to place pt. Pt did not want to go to another    contact [phone]

## 2016-03-21 ENCOUNTER — Ambulatory Visit (INDEPENDENT_AMBULATORY_CARE_PROVIDER_SITE_OTHER): Payer: BLUE CROSS/BLUE SHIELD | Admitting: Internal Medicine

## 2016-03-21 ENCOUNTER — Encounter: Payer: Self-pay | Admitting: Internal Medicine

## 2016-03-21 VITALS — BP 136/80 | HR 82 | Temp 98.0°F | Resp 16 | Ht 65.5 in | Wt 178.1 lb

## 2016-03-21 DIAGNOSIS — M79621 Pain in right upper arm: Secondary | ICD-10-CM

## 2016-03-21 DIAGNOSIS — R591 Generalized enlarged lymph nodes: Secondary | ICD-10-CM

## 2016-03-21 LAB — CBC WITH DIFFERENTIAL/PLATELET
Basophils Absolute: 0 cells/uL (ref 0–200)
Basophils Relative: 0 %
Eosinophils Absolute: 76 cells/uL (ref 15–500)
Eosinophils Relative: 1 %
HCT: 40.2 % (ref 35.0–45.0)
Hemoglobin: 13.2 g/dL (ref 11.7–15.5)
Lymphocytes Relative: 28 %
Lymphs Abs: 2128 cells/uL (ref 850–3900)
MCH: 30.8 pg (ref 27.0–33.0)
MCHC: 32.8 g/dL (ref 32.0–36.0)
MCV: 93.9 fL (ref 80.0–100.0)
MPV: 10.5 fL (ref 7.5–12.5)
Monocytes Absolute: 684 cells/uL (ref 200–950)
Monocytes Relative: 9 %
Neutro Abs: 4712 cells/uL (ref 1500–7800)
Neutrophils Relative %: 62 %
Platelets: 204 10*3/uL (ref 140–400)
RBC: 4.28 MIL/uL (ref 3.80–5.10)
RDW: 13.3 % (ref 11.0–15.0)
WBC: 7.6 10*3/uL (ref 3.8–10.8)

## 2016-03-21 MED ORDER — LOSARTAN POTASSIUM 50 MG PO TABS: 50.0000 mg | ORAL_TABLET | Freq: Every day | ORAL | 2 refills | Status: DC

## 2016-03-21 MED ORDER — ETODOLAC 500 MG PO TABS: 500.0000 mg | ORAL_TABLET | Freq: Two times a day (BID) | ORAL | 3 refills | Status: DC

## 2016-03-21 MED ORDER — ALBUTEROL SULFATE (2.5 MG/3ML) 0.083% IN NEBU: 2.5000 mg | INHALATION_SOLUTION | Freq: Four times a day (QID) | RESPIRATORY_TRACT | 1 refills | Status: DC | PRN

## 2016-03-21 NOTE — Progress Notes (Signed)
Subjective:  Patient ID: Suzanne Spears, female    DOB: 03/02/1960  Age: 56 y.o. MRN: [address]:[id number]  CC: The primary encounter diagnosis was Lymphadenopathy. A diagnosis of Pain of right upper arm was also pertinent to this visit.  HPI [name] presents for evaluation of right upper arm pain that she describes as burning currently,  But at time of cal in was describing as sore and red and swollen on the medial side of arm arm for several weeks on and off , became constant for the past 24 hours.  Was sent for urgent doppler to rule out thrombosis.  Negative ultrasound   Was in ED on jan 12 for foot pain, , blood clot ruled out.  HTN: on losartan  25 MG  HAS BEEN HAVING SHAKING CHILLS SINCE last WEEKEND,  NO URINARY SYMptoms  , SOME COUGH which has been NONPRODUCTIVE .  NO SINUS OR SORE TROAT.  NO NAUSEA   Still TENDER OVER PACEMAKER SITE   Outpatient Medications Prior to Visit  Medication Sig Dispense Refill  . albuterol (PROVENTIL HFA;VENTOLIN HFA) 108 (90 BASE) MCG/ACT inhaler Inhale 2 puffs into the lungs daily as needed for wheezing or shortness of breath.    . baclofen (LIORESAL) 10 MG tablet Take 10 mg by mouth 2 (two) times daily.   0  . cyanocobalamin (,VITAMIN B-12,) 1000 MCG/ML injection INJECT 1 ML INTO THE MUSCLE ONCE A WEEK 10 mL 2  . DULoxetine (CYMBALTA) 60 MG capsule TAKE (1) TABLET BY MOUTH EVERY DAY 30 capsule 2  . fexofenadine (ALLEGRA) 180 MG tablet Take 180 mg by mouth at bedtime.    . fluticasone (FLONASE) 50 MCG/ACT nasal spray USE 2 SPRAYS INTO BOTH NOSTRILS ONCE DAILY AS DIRECTED BY PHYSICIAN. 48 g 1  . Fluticasone Furoate (ARNUITY ELLIPTA) 100 MCG/ACT AEPB Inhale 1 puff into the lungs daily. 30 each 4  . JUNEL FE 1/20 1-20 MG-MCG tablet TAKE (1) TABLET BY MOUTH EVERY DAY 30 tablet 2  . magnesium 30 MG tablet Take 30 mg by mouth daily.    . traMADol (ULTRAM) 50 MG tablet Take 1 tablet (50 mg total) by mouth every 6 (six) hours as needed. 20 tablet 0  .  traZODone (DESYREL) 50 MG tablet TAKE 1/2-1 TABLET BY MOUTH AT BEDTIME ASNEEDED FOR SLEEP 30 tablet 5  . TROKENDI XR 100 MG CP24 100 mg daily.   4  . valACYclovir (VALTREX) 1000 MG tablet Take 1 tablet (1,000 mg total) by mouth 2 (two) times daily as needed (fever blisters). 20 tablet 3  . losartan (COZAAR) 25 MG tablet TAKE (1) TABLET BY MOUTH EVERY DAY 90 tablet 1  . montelukast (SINGULAIR) 10 MG tablet TAKE (1) TABLET BY MOUTH EVERY DAY 90 tablet 1  . benzonatate (TESSALON) 100 MG capsule Take 1 capsule (100 mg total) by mouth 3 (three) times daily as needed for cough. 12 capsule 0  . cyclobenzaprine (FLEXERIL) 10 MG tablet Take 10 mg by mouth 3 (three) times daily as needed for muscle spasms.    Marland Kitchen doxycycline (VIBRA-TABS) 100 MG tablet Take 1 tablet (100 mg total) by mouth 2 (two) times daily. 20 tablet 0  . etodolac (LODINE) 500 MG tablet Take 1 tablet (500 mg total) by mouth 2 (two) times daily. 30 tablet 0  . ketorolac (TORADOL) 10 MG tablet Take 1 tablet (10 mg total) by mouth every 6 (six) hours as needed. 20 tablet 0  . Melatonin 3 MG TABS Take 3  mg by mouth at bedtime as needed. Reported on 08/20/2015    . oxyCODONE (OXY IR/ROXICODONE) 5 MG immediate release tablet Take 1-2 tablets (5-10 mg total) by mouth every 6 (six) hours as needed for severe pain. 30 tablet 0  . topiramate (TOPAMAX) 50 MG tablet Take 1 tablet (50 mg total) by mouth at bedtime. (Patient taking differently: Take 100 mg by mouth at bedtime. ) 30 tablet 1   No facility-administered medications prior to visit.     Review of Systems;  Patient denies headache, fevers, malaise, unintentional weight loss, skin rash, eye pain, sinus congestion and sinus pain, sore throat, dysphagia,  hemoptysis , cough, dyspnea, wheezing, chest pain, palpitations, orthopnea, edema, abdominal pain, nausea, melena, diarrhea, constipation, flank pain, dysuria, hematuria, urinary  Frequency, nocturia, numbness, tingling, seizures,  Focal  weakness, Loss of consciousness,  Tremor, insomnia, depression, anxiety, and suicidal ideation.      Objective:  BP 136/80   Pulse 82   Temp 98 F (36.7 C) (Oral)   Resp 16   Ht 5' 5.5" (1.664 m)   Wt 178 lb 2 oz (80.8 kg)   SpO2 98%   BMI 29.19 kg/m   BP Readings from Last 3 Encounters:  03/21/16 136/80  03/07/16 (!) 152/95  02/25/16 (!) 121/103    Wt Readings from Last 3 Encounters:  03/21/16 178 lb 2 oz (80.8 kg)  03/07/16 176 lb (79.8 kg)  02/24/16 178 lb (80.7 kg)    General appearance: alert, cooperative and appears stated age Ears: normal TM's and external ear canals both ears Throat: lips, mucosa, and tongue normal; teeth and gums normal Neck: no adenopathy, no carotid bruit, supple, symmetrical, trachea midline and thyroid not enlarged, symmetric, no tenderness/mass/nodules Back: symmetric, no curvature. ROM normal. No CVA tenderness. Lungs: clear to auscultation bilaterally Heart: regular rate and rhythm, S1, S2 normal, no murmur, click, rub or gallop Abdomen: soft, non-tender; bowel sounds normal; no masses,  no organomegaly Pulses: 2+ and symmetric Skin: Skin color, texture, turgor normal. No rashes or lesions Lymph nodes: Cervical, supraclavicular, and axillary nodes normal.  No results found for: HGBA1C  Lab Results  Component Value Date   CREATININE 1.16 (H) 02/24/2016   CREATININE 1.19 08/27/2015   CREATININE 1.35 (H) 08/17/2015    Lab Results  Component Value Date   WBC 8.9 02/24/2016   HGB 13.2 02/24/2016   HCT 39.4 02/24/2016   PLT 227 02/24/2016   GLUCOSE 96 02/24/2016   CHOL 236 (H) 08/17/2015   TRIG 164.0 (H) 08/17/2015   HDL 50.90 08/17/2015   LDLDIRECT 151.6 05/01/2011   LDLCALC 152 (H) 08/17/2015   ALT 10 08/17/2015   AST 13 08/17/2015   NA 137 02/24/2016   K 4.3 02/24/2016   CL 109 02/24/2016   CREATININE 1.16 (H) 02/24/2016   BUN 13 02/24/2016   CO2 19 (L) 02/24/2016   TSH 3.01 08/17/2015   INR 2.8 03/27/2015    US  Venous Img Upper Uni Right  Result Date: 03/20/2016 CLINICAL DATA:  Pain, swelling, and arthralgia of the right upper arm. Symptoms since yesterday. EXAM: RIGHT UPPER EXTREMITY VENOUS DUPLEX ULTRASOUND TECHNIQUE: Doppler venous assessment of the right upper extremity deep venous system was performed, including characterization of spectral flow, compressibility, and phasicity. COMPARISON:  None. FINDINGS: Right jugular, subclavian, axillary, brachial, radial, and ulnar veins are compressible. Doppler analysis demonstrates phasicity and augmentation. No obvious superficial vein thrombosis. IMPRESSION: No evidence of right upper extremity DVT. Electronically Signed   By: Arnell Sieving  Hoss M.D.   On: 03/20/2016 12:14    Assessment & Plan:   Problem List Items Addressed This Visit    Pain of right upper arm    Her pain is in the triceps muscle bed and is exquisitely tender with no mass or dvt appreciated or seen on doppler.  Etiology likely a reactive lymph node since she has had subjective fevers/chills        Other Visit Diagnoses    Lymphadenopathy    -  Primary   Relevant Orders   Sedimentation rate   C-reactive protein   CBC with Differential/Platelet      I have discontinued Ms. Palin's topiramate, Melatonin, cyclobenzaprine, oxyCODONE, ketorolac, doxycycline, benzonatate, etodolac, and losartan. I am also having her start on losartan, albuterol, and etodolac. Additionally, I am having her maintain her fexofenadine, magnesium, albuterol, Fluticasone Furoate, fluticasone, traZODone, valACYclovir, JUNEL FE 1/20, DULoxetine, TROKENDI XR, baclofen, cyanocobalamin, and traMADol.  Meds ordered this encounter  Medications  . losartan (COZAAR) 50 MG tablet    Sig: Take 1 tablet (50 mg total) by mouth daily.    Dispense:  30 tablet    Refill:  2    NOTE DOSE CHANGE,  KEEP ON FILE FOR FUTURE REFILLS  . albuterol (PROVENTIL) (2.5 MG/3ML) 0.083% nebulizer solution    Sig: Take 3 mLs (2.5 mg total) by  nebulization every 6 (six) hours as needed for wheezing or shortness of breath.    Dispense:  150 mL    Refill:  1  . etodolac (LODINE) 500 MG tablet    Sig: Take 1 tablet (500 mg total) by mouth 2 (two) times daily.    Dispense:  60 tablet    Refill:  3   A total of 25 minutes of face to face time was spent with patient more than half of which was spent in counselling about the above mentioned conditions  and coordination of care .   Medications Discontinued During This Encounter  Medication Reason  . cyclobenzaprine (FLEXERIL) 10 MG tablet Change in therapy  . benzonatate (TESSALON) 100 MG capsule Completed Course  . doxycycline (VIBRA-TABS) 100 MG tablet Completed Course  . etodolac (LODINE) 500 MG tablet Completed Course  . ketorolac (TORADOL) 10 MG tablet Completed Course  . Melatonin 3 MG TABS Patient Discharge  . oxyCODONE (OXY IR/ROXICODONE) 5 MG immediate release tablet No longer needed (for PRN medications)  . topiramate (TOPAMAX) 50 MG tablet Change in therapy  . losartan (COZAAR) 25 MG tablet     Follow-up: No Follow-up on file.   [name], MD

## 2016-03-21 NOTE — Patient Instructions (Signed)
RESUME ETODOLAC   TRY ICING THE AREA FOR 15 MINUTES A  FEW TIMES DAILY

## 2016-03-22 ENCOUNTER — Other Ambulatory Visit: Payer: Self-pay | Admitting: Internal Medicine

## 2016-03-22 LAB — SEDIMENTATION RATE: Sed Rate: 8 mm/hr (ref 0–30)

## 2016-03-23 DIAGNOSIS — M79621 Pain in right upper arm: Secondary | ICD-10-CM | POA: Insufficient documentation

## 2016-03-23 NOTE — Assessment & Plan Note (Signed)
Her pain is in the triceps muscle bed and is exquisitely tender with no mass or dvt appreciated or seen on doppler.  Etiology likely a reactive lymph node since she has had subjective fevers/chills

## 2016-03-24 ENCOUNTER — Encounter: Payer: Self-pay | Admitting: Internal Medicine

## 2016-03-24 LAB — C-REACTIVE PROTEIN: CRP: 11.2 mg/L — ABNORMAL HIGH (ref <8.0)

## 2016-03-25 ENCOUNTER — Encounter: Payer: Self-pay | Admitting: Internal Medicine

## 2016-03-31 ENCOUNTER — Telehealth: Payer: Self-pay | Admitting: Internal Medicine

## 2016-03-31 ENCOUNTER — Emergency Department: Payer: BLUE CROSS/BLUE SHIELD

## 2016-03-31 ENCOUNTER — Emergency Department
Admission: EM | Admit: 2016-03-31 | Discharge: 2016-04-01 | Disposition: A | Discharge status: Home / Self Care | Payer: BLUE CROSS/BLUE SHIELD | Attending: Emergency Medicine | Admitting: Emergency Medicine

## 2016-03-31 ENCOUNTER — Telehealth: Payer: Self-pay | Admitting: *Deleted

## 2016-03-31 DIAGNOSIS — R51 Headache: Secondary | ICD-10-CM | POA: Diagnosis not present

## 2016-03-31 DIAGNOSIS — H5711 Ocular pain, right eye: Secondary | ICD-10-CM | POA: Insufficient documentation

## 2016-03-31 DIAGNOSIS — I110 Hypertensive heart disease with heart failure: Secondary | ICD-10-CM | POA: Insufficient documentation

## 2016-03-31 DIAGNOSIS — I5022 Chronic systolic (congestive) heart failure: Secondary | ICD-10-CM | POA: Diagnosis not present

## 2016-03-31 DIAGNOSIS — H547 Unspecified visual loss: Secondary | ICD-10-CM | POA: Diagnosis not present

## 2016-03-31 DIAGNOSIS — J45909 Unspecified asthma, uncomplicated: Secondary | ICD-10-CM | POA: Insufficient documentation

## 2016-03-31 DIAGNOSIS — Z79899 Other long term (current) drug therapy: Secondary | ICD-10-CM | POA: Diagnosis not present

## 2016-03-31 DIAGNOSIS — I2510 Atherosclerotic heart disease of native coronary artery without angina pectoris: Secondary | ICD-10-CM | POA: Diagnosis not present

## 2016-03-31 DIAGNOSIS — Z95810 Presence of automatic (implantable) cardiac defibrillator: Secondary | ICD-10-CM | POA: Insufficient documentation

## 2016-03-31 LAB — CBC WITH DIFFERENTIAL/PLATELET
Basophils Absolute: 0.1 10*3/uL (ref 0–0.1)
Basophils Relative: 1 %
Eosinophils Absolute: 0.1 10*3/uL (ref 0–0.7)
Eosinophils Relative: 1 %
HCT: 39.3 % (ref 35.0–47.0)
Hemoglobin: 13.4 g/dL (ref 12.0–16.0)
Lymphocytes Relative: 27 %
Lymphs Abs: 2.4 10*3/uL (ref 1.0–3.6)
MCH: 31.5 pg (ref 26.0–34.0)
MCHC: 34.2 g/dL (ref 32.0–36.0)
MCV: 92.1 fL (ref 80.0–100.0)
Monocytes Absolute: 0.6 10*3/uL (ref 0.2–0.9)
Monocytes Relative: 7 %
Neutro Abs: 5.8 10*3/uL (ref 1.4–6.5)
Neutrophils Relative %: 64 %
Platelets: 206 10*3/uL (ref 150–440)
RBC: 4.27 MIL/uL (ref 3.80–5.20)
RDW: 13.3 % (ref 11.5–14.5)
WBC: 9 10*3/uL (ref 3.6–11.0)

## 2016-03-31 LAB — BASIC METABOLIC PANEL
Anion gap: 7 (ref 5–15)
BUN: 15 mg/dL (ref 6–20)
CO2: 22 mmol/L (ref 22–32)
Calcium: 8.6 mg/dL — ABNORMAL LOW (ref 8.9–10.3)
Chloride: 108 mmol/L (ref 101–111)
Creatinine, Ser: 1.18 mg/dL — ABNORMAL HIGH (ref 0.44–1.00)
GFR calc Af Amer: 59 mL/min — ABNORMAL LOW (ref >60)
GFR calc non Af Amer: 51 mL/min — ABNORMAL LOW (ref >60)
Glucose, Bld: 91 mg/dL (ref 65–99)
Potassium: 3.4 mmol/L — ABNORMAL LOW (ref 3.5–5.1)
Sodium: 137 mmol/L (ref 135–145)

## 2016-03-31 MED ORDER — MORPHINE SULFATE (PF) 4 MG/ML IV SOLN
4.0000 mg | Freq: Once | INTRAVENOUS | Status: AC
  Administered 2016-03-31: 4 mg via INTRAVENOUS
  Filled 2016-03-31: qty 1

## 2016-03-31 MED ORDER — ONDANSETRON HCL 4 MG/2ML IJ SOLN
4.0000 mg | Freq: Once | INTRAMUSCULAR | Status: AC
  Administered 2016-03-31: 4 mg via INTRAVENOUS
  Filled 2016-03-31: qty 2

## 2016-03-31 MED ORDER — IOPAMIDOL (ISOVUE-370) INJECTION 76%
75.0000 mL | Freq: Once | INTRAVENOUS | Status: AC | PRN
  Administered 2016-03-31: 75 mL via INTRAVENOUS

## 2016-03-31 NOTE — ED Notes (Signed)
Pt c/o RT eye pain that started this am at work and went around her orbital socket, denies any numbness or weakness. Pt speech clear, ambulatory, A&OX4

## 2016-03-31 NOTE — Telephone Encounter (Signed)
fyi

## 2016-03-31 NOTE — ED Triage Notes (Signed)
Pt is having pain behind right eye since 415pm - pt reports having a headache on Saturday and today has had some neck pain - pt denies any visual changes - pt denies any other symptoms

## 2016-03-31 NOTE — Telephone Encounter (Signed)
Is currently having severe pain behind her left eye all of a sudden .  Pt was transferred to [facility]
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
## 2016-03-31 NOTE — Telephone Encounter (Signed)
[facility]  Patient Name: Suzanne Spears  DOB: 1960-10-28    Initial Comment Caller states severe pain behind the eye that came on all the sudden. Also having some neck pain.    Nurse Assessment  Nurse: Harlow Mares, RN, Suanne Marker Date/Time (Eastern Time): 03/31/2016 4:48:34 PM  Confirm and document reason for call. If symptomatic, describe symptoms. ---Caller states severe pain behind the eye that came on all the sudden. Also having some neck pain. Neck pain is not new, seeing a neurologist. The eye pain began when she looked to the right. It feels like someone is twisting the muscle behind the eye, thought she was going to pass out.  Does the patient have any new or worsening symptoms? ---Yes  Will a triage be completed? ---Yes  Related visit to physician within the last 2 weeks? ---N/A  Does the PT have any chronic conditions? (i.e. diabetes, asthma, etc.) ---Yes  List chronic conditions. ---CHF; HTN  Is the patient pregnant or possibly pregnant? (Ask all females between the ages of 39-55) ---No  Is this a behavioral health or substance abuse call? ---No     Guidelines    Guideline Title Affirmed Question Affirmed Notes  Eye Pain SEVERE eye pain    Final Disposition User   Go to ED Now Harlow Mares, RN, [facility] - ED   Disagree/Comply: Comply

## 2016-03-31 NOTE — Telephone Encounter (Signed)
See [facility] note. 

## 2016-04-01 MED ORDER — TETRACAINE HCL 0.5 % OP SOLN
OPHTHALMIC | Status: AC
  Filled 2016-04-01: qty 2

## 2016-04-01 NOTE — Telephone Encounter (Signed)
Pt was seen at ER. 

## 2016-04-01 NOTE — ED Provider Notes (Addendum)
Clinical Course as of Apr 02 723  Tue Apr 01, 2016  0022 Checked eye pressures with [employer] for Dr. Cinda Quest.  OS 10, OD 12 (both measurements were repeated with consistent results).  Pain much improved at this time, likely due to morphine, tetracaine, or both.  Awaiting callback from Ophtho.  Dr. Cinda Quest started paging and having the secretary speak with the [facility] answering service at about 11:15pm.  [CF]  0103 It is now been about 2 hours since we attempted to contact the ophthalmologist but has not been successful yet.  The patient states her pain is now very well controlled even though she still feels like she has decreased vision.  As documented above, her eye pressures were normal and her signs and symptoms are not consistent with acute angle closure glaucoma, vascular abnormality, uveitis, conjunctivitis, globe trauma, or any other emergent ophthalmological medical condition.  I still feel she needs close outpatient follow-up and encouraged her to go to the [facility] first thing in the morning, which is in about 7 hours.  She agrees to do so.  I gave my usual and customary return precautions  [CF]  [id number] (Note that this documentation was delayed due to multiple ED patients requiring immediate care.)  Dr. Benay Pillow with the [facility] called back about 30 minutes or so after I discharged the patient.  I explained the situation and he agrees that it sounds as if the patient can be worked up safely today and did not require transfer based on the information I provided over the phone.  He will make sure to the best of his ability the patient is seen this morning.  [CF]    Clinical Course User Index [CF] Hinda Kehr, MD      Hinda Kehr, MD 04/01/16 [id number]    Hinda Kehr, MD 04/01/16 [id number]    Hinda Kehr, MD 04/01/16 [id number]

## 2016-04-01 NOTE — Discharge Instructions (Signed)
Please go to the [facility] by 8:00am today.  Explain that you were seen in the Emergency Department, and that we attempted to contact Dr. Cephus Shelling.  We did not hear back from her and thus were not able to set up a follow up appointment, but you need to be seen today by an ophthalmologist for follow-up.  Please let them know that you had a CTA of the head and neck which were normal, your intraocular pressures were 12 in the right eye and 10 in the left eye, and that your vision in the right eye was measured at 20/70.  The [facility] is great at seeing patients as soon as possible who are following up from the Emergency Department.    If you develop new or worsening symptoms that concern you before your eye appointment, please return to the Emergency Department.

## 2016-04-03 ENCOUNTER — Telehealth: Payer: Self-pay | Admitting: Family

## 2016-04-03 NOTE — Telephone Encounter (Signed)
Virl Axe, MD called and asked [name] to call this patient to discuss the HF Clinic as well as rehab.  Called patient to explain who I was, why I was calling and the services that the HF Clinic provided. Also explained that if she came to the HF Clinic that I could then make a referral to either cardiac rehab or pulmonary rehab so that she could begin a monitored exercise program. Explained that she would continue to see her current providers.   She says that she has an appointment with Dr. Mauri Reading on 04/21/16. She would like to discuss all of this with him and if he would like her to come to the [facility] Clinic that she would call back. She says that she will also discuss with him referring her to rehab for exercise as well as repeating a sleep study as she says that she has gained weight over the last few years and snores more than she used to.   She was appreciative of the information.

## 2016-04-04 ENCOUNTER — Other Ambulatory Visit: Payer: Self-pay | Admitting: Internal Medicine

## 2016-04-09 LAB — CUP PACEART INCLINIC DEVICE CHECK
Battery Remaining Longevity: 53 mo
Battery Remaining Longevity: 53 mo
Battery Voltage: 2.94 V
Battery Voltage: 2.94 V
Brady Statistic AP VP Percent: 0.02 %
Brady Statistic AP VP Percent: 0.02 %
Brady Statistic AP VS Percent: 0.01 %
Brady Statistic AP VS Percent: 0.01 %
Brady Statistic AS VP Percent: 99.8 %
Brady Statistic AS VP Percent: 99.8 %
Brady Statistic AS VS Percent: 0.17 %
Brady Statistic AS VS Percent: 0.17 %
Brady Statistic RA Percent Paced: 0.03 %
Brady Statistic RA Percent Paced: 0.03 %
Brady Statistic RV Percent Paced: 99.68 %
Brady Statistic RV Percent Paced: 99.68 %
Date Time Interrogation Session: 20171205182928
Date Time Interrogation Session: 20171205182928
HighPow Impedance: 85 Ohm
HighPow Impedance: 85 Ohm
Implantable Lead Implant Date: 20160421
Implantable Lead Implant Date: 20160421
Implantable Lead Implant Date: 20160421
Implantable Lead Implant Date: 20160421
Implantable Lead Implant Date: 20161006
Implantable Lead Implant Date: 20161006
Implantable Lead Implant Date: 20161006
Implantable Lead Implant Date: 20161006
Implantable Lead Location: 753858
Implantable Lead Location: 753858
Implantable Lead Location: 753858
Implantable Lead Location: 753858
Implantable Lead Location: 753859
Implantable Lead Location: 753859
Implantable Lead Location: 753860
Implantable Lead Location: 753860
Implantable Lead Model: 5071
Implantable Lead Model: 5071
Implantable Lead Model: 5071
Implantable Lead Model: 5071
Implantable Lead Model: 5076
Implantable Lead Model: 5076
Implantable Pulse Generator Implant Date: 20160421
Implantable Pulse Generator Implant Date: 20160421
Lead Channel Impedance Value: 342 Ohm
Lead Channel Impedance Value: 342 Ohm
Lead Channel Impedance Value: 342 Ohm
Lead Channel Impedance Value: 342 Ohm
Lead Channel Impedance Value: 4047 Ohm
Lead Channel Impedance Value: 4047 Ohm
Lead Channel Impedance Value: 4047 Ohm
Lead Channel Impedance Value: 4047 Ohm
Lead Channel Impedance Value: 437 Ohm
Lead Channel Impedance Value: 437 Ohm
Lead Channel Impedance Value: 437 Ohm
Lead Channel Impedance Value: 437 Ohm
Lead Channel Pacing Threshold Amplitude: 0.5 V
Lead Channel Pacing Threshold Amplitude: 0.5 V
Lead Channel Pacing Threshold Amplitude: 0.5 V
Lead Channel Pacing Threshold Amplitude: 0.5 V
Lead Channel Pacing Threshold Amplitude: 1.25 V
Lead Channel Pacing Threshold Amplitude: 1.375 V
Lead Channel Pacing Threshold Pulse Width: 0.4 ms
Lead Channel Pacing Threshold Pulse Width: 0.4 ms
Lead Channel Pacing Threshold Pulse Width: 0.4 ms
Lead Channel Pacing Threshold Pulse Width: 0.8 ms
Lead Channel Pacing Threshold Pulse Width: 0.8 ms
Lead Channel Pacing Threshold Pulse Width: 0.8 ms
Lead Channel Sensing Intrinsic Amplitude: 2 mV
Lead Channel Sensing Intrinsic Amplitude: 2 mV
Lead Channel Sensing Intrinsic Amplitude: 31.25 mV
Lead Channel Sensing Intrinsic Amplitude: 31.25 mV
Lead Channel Setting Pacing Amplitude: 1.5 V
Lead Channel Setting Pacing Amplitude: 1.5 V
Lead Channel Setting Pacing Amplitude: 2 V
Lead Channel Setting Pacing Amplitude: 2 V
Lead Channel Setting Pacing Amplitude: 2.5 V
Lead Channel Setting Pacing Amplitude: 2.5 V
Lead Channel Setting Pacing Pulse Width: 0.4 ms
Lead Channel Setting Pacing Pulse Width: 0.4 ms
Lead Channel Setting Pacing Pulse Width: 0.8 ms
Lead Channel Setting Pacing Pulse Width: 0.8 ms
Lead Channel Setting Sensing Sensitivity: 0.3 mV
Lead Channel Setting Sensing Sensitivity: 0.3 mV

## 2016-04-17 ENCOUNTER — Encounter: Payer: Self-pay | Admitting: Internal Medicine

## 2016-04-21 ENCOUNTER — Other Ambulatory Visit: Payer: Self-pay | Admitting: Internal Medicine

## 2016-04-22 ENCOUNTER — Other Ambulatory Visit: Payer: Self-pay | Admitting: Internal Medicine

## 2016-04-28 MED ORDER — CITALOPRAM HYDROBROMIDE 10 MG PO TABS: 10.0000 mg | ORAL_TABLET | Freq: Every day | ORAL | 4 refills | Status: DC

## 2016-04-29 ENCOUNTER — Telehealth: Payer: Self-pay | Admitting: Cardiology

## 2016-04-29 ENCOUNTER — Ambulatory Visit (INDEPENDENT_AMBULATORY_CARE_PROVIDER_SITE_OTHER): Payer: BLUE CROSS/BLUE SHIELD | Admitting: *Deleted

## 2016-04-29 DIAGNOSIS — I429 Cardiomyopathy, unspecified: Secondary | ICD-10-CM | POA: Diagnosis not present

## 2016-04-29 DIAGNOSIS — I420 Dilated cardiomyopathy: Secondary | ICD-10-CM

## 2016-04-29 NOTE — Telephone Encounter (Signed)
LMOVM reminding pt to send remote transmission.   

## 2016-04-30 ENCOUNTER — Other Ambulatory Visit
Admission: RE | Admit: 2016-04-30 | Discharge: 2016-04-30 | Disposition: A | Discharge status: Home / Self Care | Payer: BLUE CROSS/BLUE SHIELD | Source: Ambulatory Visit | Attending: Internal Medicine | Admitting: Internal Medicine

## 2016-04-30 ENCOUNTER — Other Ambulatory Visit: Payer: Self-pay | Admitting: Ophthalmology

## 2016-04-30 ENCOUNTER — Other Ambulatory Visit (HOSPITAL_COMMUNITY): Payer: Self-pay | Admitting: Ophthalmology

## 2016-04-30 DIAGNOSIS — H5711 Ocular pain, right eye: Secondary | ICD-10-CM | POA: Diagnosis not present

## 2016-05-01 ENCOUNTER — Encounter: Payer: Self-pay | Admitting: Cardiology

## 2016-05-01 LAB — THYROID STIMULATING IMMUNOGLOBULIN: Thyroid Stimulating Immunoglob: <0.1 IU/L (ref 0.00–0.55)

## 2016-05-01 NOTE — Progress Notes (Signed)
Remote ICD transmission.   

## 2016-05-02 ENCOUNTER — Other Ambulatory Visit: Payer: Self-pay | Admitting: Ophthalmology

## 2016-05-02 DIAGNOSIS — H5711 Ocular pain, right eye: Secondary | ICD-10-CM

## 2016-05-02 LAB — CUP PACEART REMOTE DEVICE CHECK
Battery Remaining Longevity: 49 mo
Battery Voltage: 2.95 V
Brady Statistic AP VP Percent: 0.03 %
Brady Statistic AP VS Percent: 0.01 %
Brady Statistic AS VP Percent: 98.7 %
Brady Statistic AS VS Percent: 1.26 %
Brady Statistic RA Percent Paced: 0.04 %
Brady Statistic RV Percent Paced: 5.14 %
Date Time Interrogation Session: 20180308010038
HighPow Impedance: 86 Ohm
Implantable Lead Implant Date: 20160421
Implantable Lead Implant Date: 20160421
Implantable Lead Implant Date: 20161006
Implantable Lead Implant Date: 20161006
Implantable Lead Location: 753858
Implantable Lead Location: 753858
Implantable Lead Location: 753859
Implantable Lead Location: 753860
Implantable Lead Model: 5071
Implantable Lead Model: 5071
Implantable Lead Model: 5076
Implantable Pulse Generator Implant Date: 20160421
Lead Channel Impedance Value: 323 Ohm
Lead Channel Impedance Value: 342 Ohm
Lead Channel Impedance Value: 399 Ohm
Lead Channel Impedance Value: 4047 Ohm
Lead Channel Impedance Value: 4047 Ohm
Lead Channel Impedance Value: 437 Ohm
Lead Channel Pacing Threshold Amplitude: 0.75 V
Lead Channel Pacing Threshold Amplitude: 1.5 V
Lead Channel Pacing Threshold Pulse Width: 0.4 ms
Lead Channel Pacing Threshold Pulse Width: 0.4 ms
Lead Channel Sensing Intrinsic Amplitude: 3.5 mV
Lead Channel Sensing Intrinsic Amplitude: 3.5 mV
Lead Channel Sensing Intrinsic Amplitude: 30.25 mV
Lead Channel Sensing Intrinsic Amplitude: 30.25 mV
Lead Channel Setting Pacing Amplitude: 1.5 V
Lead Channel Setting Pacing Amplitude: 2 V
Lead Channel Setting Pacing Amplitude: 2.5 V
Lead Channel Setting Pacing Pulse Width: 0.4 ms
Lead Channel Setting Pacing Pulse Width: 0.8 ms
Lead Channel Setting Sensing Sensitivity: 0.3 mV

## 2016-05-05 ENCOUNTER — Ambulatory Visit: Payer: BLUE CROSS/BLUE SHIELD | Admitting: Internal Medicine

## 2016-05-13 ENCOUNTER — Other Ambulatory Visit: Payer: Self-pay | Admitting: Internal Medicine

## 2016-05-13 ENCOUNTER — Ambulatory Visit (INDEPENDENT_AMBULATORY_CARE_PROVIDER_SITE_OTHER): Payer: BLUE CROSS/BLUE SHIELD | Admitting: Internal Medicine

## 2016-05-13 ENCOUNTER — Encounter: Payer: Self-pay | Admitting: Internal Medicine

## 2016-05-13 VITALS — BP 118/74 | HR 95 | Resp 17 | Ht 65.5 in | Wt 180.0 lb

## 2016-05-13 DIAGNOSIS — R0609 Other forms of dyspnea: Secondary | ICD-10-CM

## 2016-05-13 DIAGNOSIS — M79621 Pain in right upper arm: Secondary | ICD-10-CM | POA: Diagnosis not present

## 2016-05-13 DIAGNOSIS — F418 Other specified anxiety disorders: Secondary | ICD-10-CM

## 2016-05-13 MED ORDER — GABAPENTIN 100 MG PO CAPS: 100.0000 mg | ORAL_CAPSULE | Freq: Three times a day (TID) | ORAL | 3 refills | Status: DC

## 2016-05-13 NOTE — Patient Instructions (Addendum)
You can either:  Take the 300 mg gabapentin earlier in the evening,  And take 100 mg during daytime   Or reducing   dose to 100 mg   Start taking the etodolac for the thumb and other joint pains   Start the citalopram ,  It has a 10 mg dose,  You can start with 1/2 tablet daily after dinner.    DO NOT START THE CYMBALTA (DULOXETINE)   YOU CAN SUSPEND  TRAZODONE IN ORDER TO GET THE 300 MG GABAPENTIN DOSE ON BOARD  AT NIGHT

## 2016-05-13 NOTE — Progress Notes (Signed)
Pre visit review using our clinic review tool, if applicable. No additional management support is needed unless otherwise documented below in the visit note. 

## 2016-05-13 NOTE — Progress Notes (Signed)
Subjective:  Patient ID: Suzanne Spears, female    DOB: 04/23/1960  Age: 56 y.o. MRN: [id number]  CC: The primary encounter diagnosis was Exertional dyspnea. Diagnoses of Depression with anxiety and Pain of right upper arm were also pertinent to this visit.  HPI Suzanne Spears presents for follow up on depression  aggravated by right wrist pain attributed to CTS .  gabapentin started But making her too drowsy  never started the  cymbalta    lots of issues being evaluated :  Pain behind right ear excruciating , intermittent workup in progress ct scan of orbits  Scheduled for Thursday by  eye to evaluate facial nerve   right shoulder  , right eye,  Right wrist all problematic.  Right thumb is now numb  Had a steroid injection for CTS by Manuella Ghazi did not help past the first week.    Cardiac rehab referral in progress awaiting authorization  FMLA etc   Some marital conflict over her observations about Mother in law who had been having increasing falls,  Now recognized to have dementia needing placement    she endorses fatigue, anxiety about her overall prognosis , not sleeping well,  In constant pain.    Outpatient Medications Prior to Visit  Medication Sig Dispense Refill  . albuterol (PROVENTIL HFA;VENTOLIN HFA) 108 (90 BASE) MCG/ACT inhaler Inhale 2 puffs into the lungs daily as needed for wheezing or shortness of breath.    Marland Kitchen albuterol (PROVENTIL) (2.5 MG/3ML) 0.083% nebulizer solution Take 3 mLs (2.5 mg total) by nebulization every 6 (six) hours as needed for wheezing or shortness of breath. 150 mL 1  . baclofen (LIORESAL) 10 MG tablet Take 10 mg by mouth 2 (two) times daily.   0  . citalopram (CELEXA) 10 MG tablet Take 1 tablet (10 mg total) by mouth daily. 30 tablet 4  . cyanocobalamin (,VITAMIN B-12,) 1000 MCG/ML injection INJECT 1 ML INTO THE MUSCLE ONCE A WEEK 10 mL 2  . etodolac (LODINE) 500 MG tablet Take 1 tablet (500 mg total) by mouth 2 (two) times daily. 60 tablet  3  . fexofenadine (ALLEGRA) 180 MG tablet Take 180 mg by mouth at bedtime.    . fluticasone (FLONASE) 50 MCG/ACT nasal spray USE 2 SPRAYS INTO BOTH NOSTRILS ONCE DAILY AS DIRECTED BY PHYSICIAN. 48 g 1  . JUNEL FE 1/20 1-20 MG-MCG tablet TAKE ONE (1) TABLET BY MOUTH ONCE DAILY 28 tablet 2  . losartan (COZAAR) 50 MG tablet Take 1 tablet (50 mg total) by mouth daily. 30 tablet 2  . magnesium 30 MG tablet Take 30 mg by mouth daily.    . montelukast (SINGULAIR) 10 MG tablet TAKE ONE (1) TABLET BY MOUTH ONCE DAILY 30 tablet 5  . traMADol (ULTRAM) 50 MG tablet Take 1 tablet (50 mg total) by mouth every 6 (six) hours as needed. 20 tablet 0  . traZODone (DESYREL) 50 MG tablet TAKE 1/2-1 TABLET BY MOUTH AT BEDTIME ASNEEDED FOR SLEEP 30 tablet 5  . TROKENDI XR 100 MG CP24 100 mg daily.   4  . valACYclovir (VALTREX) 1000 MG tablet Take 1 tablet (1,000 mg total) by mouth 2 (two) times daily as needed (fever blisters). 20 tablet 3  . DULoxetine (CYMBALTA) 60 MG capsule TAKE ONE (1) CAPSULE EACH DAY. 30 capsule 2  . Fluticasone Furoate (ARNUITY ELLIPTA) 100 MCG/ACT AEPB Inhale 1 puff into the lungs daily. 30 each 4   No facility-administered medications prior to visit.  Review of Systems;  Patient denies headache, fevers, malaise, unintentional weight loss, skin rash, eye pain, sinus congestion and sinus pain, sore throat, dysphagia,  hemoptysis , cough, dyspnea, wheezing, chest pain, palpitations, orthopnea, edema, abdominal pain, nausea, melena, diarrhea, constipation, flank pain, dysuria, hematuria, urinary  Frequency, nocturia, numbness, tingling, seizures,  Focal weakness, Loss of consciousness,  Tremor, insomnia, depression, anxiety, and suicidal ideation.      Objective:  BP 118/74 (BP Location: Left Arm, Patient Position: Sitting, Cuff Size: Large)   Pulse 95   Resp 17   Ht 5' 5.5" (1.664 m)   Wt 180 lb (81.6 kg)   SpO2 98%   BMI 29.50 kg/m   BP Readings from Last 3 Encounters:    05/13/16 118/74  04/01/16 (!) 142/87  03/21/16 136/80    Wt Readings from Last 3 Encounters:  05/13/16 180 lb (81.6 kg)  03/31/16 183 lb (83 kg)  03/21/16 178 lb 2 oz (80.8 kg)    General appearance: alert, cooperative and appears stated age Ears: normal TM's and external ear canals both ears Throat: lips, mucosa, and tongue normal; teeth and gums normal Neck: no adenopathy, no carotid bruit, supple, symmetrical, trachea midline and thyroid not enlarged, symmetric, no tenderness/mass/nodules Back: symmetric, no curvature. ROM normal. No CVA tenderness. Lungs: clear to auscultation bilaterally Heart: regular rate and rhythm, S1, S2 normal, no murmur, click, rub or gallop Abdomen: soft, non-tender; bowel sounds normal; no masses,  no organomegaly Pulses: 2+ and symmetric Skin: Skin color, texture, turgor normal. No rashes or lesions Lymph nodes: Cervical, supraclavicular, and axillary nodes normal.  No results found for: HGBA1C  Lab Results  Component Value Date   CREATININE 1.18 (H) 03/31/2016   CREATININE 1.16 (H) 02/24/2016   CREATININE 1.19 08/27/2015    Lab Results  Component Value Date   WBC 9.0 03/31/2016   HGB 13.4 03/31/2016   HCT 39.3 03/31/2016   PLT 206 03/31/2016   GLUCOSE 91 03/31/2016   CHOL 236 (H) 08/17/2015   TRIG 164.0 (H) 08/17/2015   HDL 50.90 08/17/2015   LDLDIRECT 151.6 05/01/2011   LDLCALC 152 (H) 08/17/2015   ALT 10 08/17/2015   AST 13 08/17/2015   NA 137 03/31/2016   K 3.4 (L) 03/31/2016   CL 108 03/31/2016   CREATININE 1.18 (H) 03/31/2016   BUN 15 03/31/2016   CO2 22 03/31/2016   TSH 3.01 08/17/2015   INR 2.8 03/27/2015    No results found.  Assessment & Plan:   Problem List Items Addressed This Visit    Depression with anxiety    Trial of citalopram       Pain of right upper arm    Trial of gabapentin and etodolac .  Gabapentin  not tolerated at 300 mg dose due to concurrent use of trazodone.  Dose adjusted to 100 mg for  daytime use.  Suggested she hold the trazodone        Other Visit Diagnoses    Exertional dyspnea    -  Primary   Relevant Orders   Ambulatory referral to Cardiology     A total of 25 minutes of face to face time was spent with patient more than half of which was spent in counselling about the above mentioned conditions  and coordination of care  I have discontinued Ms. Castorena's DULoxetine, Diclofenac Potassium, and gabapentin. I am also having her start on gabapentin. Additionally, I am having her maintain her fexofenadine, magnesium, albuterol, fluticasone, traZODone, valACYclovir, TROKENDI XR,  baclofen, cyanocobalamin, traMADol, losartan, albuterol, etodolac, montelukast, JUNEL FE 1/20, citalopram, and HYDROcodone-acetaminophen.  Meds ordered this encounter  Medications  . DISCONTD: Diclofenac Potassium 50 MG PACK    Sig: Take 50 mg by mouth as needed.  Marland Kitchen DISCONTD: gabapentin (NEURONTIN) 300 MG capsule    Sig: Take 300 mg by mouth 2 (two) times daily.  Marland Kitchen HYDROcodone-acetaminophen (NORCO/VICODIN) 5-325 MG tablet    Sig: Take 1 tablet by mouth every 8 (eight) hours as needed for moderate pain.  Marland Kitchen gabapentin (NEURONTIN) 100 MG capsule    Sig: Take 1 capsule (100 mg total) by mouth 3 (three) times daily.    Dispense:  90 capsule    Refill:  3    Medications Discontinued During This Encounter  Medication Reason  . gabapentin (NEURONTIN) 300 MG capsule   . DULoxetine (CYMBALTA) 60 MG capsule   . Diclofenac Potassium 50 MG PACK     Follow-up: No Follow-up on file.   [name], MD

## 2016-05-13 NOTE — Telephone Encounter (Signed)
refilled 

## 2016-05-13 NOTE — Telephone Encounter (Signed)
OK to order was originally ordered by Dr. Stevenson Clinch?

## 2016-05-14 NOTE — Assessment & Plan Note (Addendum)
Trial of gabapentin and etodolac .  Gabapentin  not tolerated at 300 mg dose due to concurrent use of trazodone.  Dose adjusted to 100 mg for daytime use.  Suggested she hold the trazodone

## 2016-05-14 NOTE — Assessment & Plan Note (Signed)
Trial of citalopram

## 2016-05-15 ENCOUNTER — Ambulatory Visit
Admission: RE | Admit: 2016-05-15 | Discharge: 2016-05-15 | Disposition: A | Discharge status: Home / Self Care | Payer: BLUE CROSS/BLUE SHIELD | Source: Ambulatory Visit | Attending: Ophthalmology | Admitting: Ophthalmology

## 2016-05-15 DIAGNOSIS — H5711 Ocular pain, right eye: Secondary | ICD-10-CM | POA: Diagnosis not present

## 2016-05-16 ENCOUNTER — Encounter: Payer: Self-pay | Admitting: Internal Medicine

## 2016-05-22 ENCOUNTER — Encounter: Payer: BLUE CROSS/BLUE SHIELD | Attending: Internal Medicine | Admitting: *Deleted

## 2016-05-22 VITALS — Ht 66.0 in | Wt 176.2 lb

## 2016-05-22 DIAGNOSIS — I5022 Chronic systolic (congestive) heart failure: Secondary | ICD-10-CM | POA: Insufficient documentation

## 2016-05-22 NOTE — Patient Instructions (Signed)
Patient Instructions  Patient Details  Name: Suzanne Spears MRN: [id number] Date of Birth: Mar 27, 1960 Referring Provider:  Corey Skains, MD  Below are the personal goals you chose as well as exercise and nutrition goals. Our goal is to help you keep on track towards obtaining and maintaining your goals. We will be discussing your progress on these goals with you throughout the program.  Initial Exercise Prescription:     Initial Exercise Prescription - 05/22/16 1600      Date of Initial Exercise RX and Referring Provider   Date 05/22/16   Referring Provider Nehemiah Massed     Treadmill   MPH 2.5   Grade 0   Minutes 15   METs 2.91     Recumbant Bike   Level 1   Minutes 15   METs 2.5     NuStep   Level 3   SPM 80   Minutes 15   METs 2.5     REL-XR   Level 1   Speed 50   Minutes 15   METs 2.5     Biostep-RELP   Level 2   SPM 50   Minutes 15   METs 2     Prescription Details   Frequency (times per week) 3   Duration Progress to 45 minutes of aerobic exercise without signs/symptoms of physical distress     Intensity   THRR 40-80% of Max Heartrate 121-151   Ratings of Perceived Exertion 11-13   Perceived Dyspnea 0-4     Resistance Training   Training Prescription Yes   Weight 2      Exercise Goals: Frequency: Be able to perform aerobic exercise three times per week working toward 3-5 days per week.  Intensity: Work with a perceived exertion of 11 (fairly light) - 15 (hard) as tolerated. Follow your new exercise prescription and watch for changes in prescription as you progress with the program. Changes will be reviewed with you when they are made.  Duration: You should be able to do 30 minutes of continuous aerobic exercise in addition to a 5 minute warm-up and a 5 minute cool-down routine.  Nutrition Goals: Your personal nutrition goals will be established when you do your nutrition analysis with the dietician.  The following are nutrition guidelines  to follow: Cholesterol < 200mg /day Sodium < 1500mg /day Fiber: Women over 50 yrs - 21 grams per day  Personal Goals:     Personal Goals and Risk Factors at Admission - 05/22/16 1539      Core Components/Risk Factors/Patient Goals on Admission   Improve shortness of breath with ADL's Yes   Intervention Provide education, individualized exercise plan and daily activity instruction to help decrease symptoms of SOB with activities of daily living.   Expected Outcomes Short Term: Achieves a reduction of symptoms when performing activities of daily living.   Heart Failure Yes   Intervention Provide a combined exercise and nutrition program that is supplemented with education, support and counseling about heart failure. Directed toward relieving symptoms such as shortness of breath, decreased exercise tolerance, and extremity edema.   Expected Outcomes Improve functional capacity of life;Short term: Attendance in program 2-3 days a week with increased exercise capacity. Reported lower sodium intake. Reported increased fruit and vegetable intake. Reports medication compliance.;Short term: Daily weights obtained and reported for increase. Utilizing diuretic protocols set by physician.;Long term: Adoption of self-care skills and reduction of barriers for early signs and symptoms recognition and intervention leading to self-care maintenance.  Lipids Yes   Intervention Provide education and support for participant on nutrition & aerobic/resistive exercise along with prescribed medications to achieve LDL 70mg , HDL >40mg .   Expected Outcomes Short Term: Participant states understanding of desired cholesterol values and is compliant with medications prescribed. Participant is following exercise prescription and nutrition guidelines.;Long Term: Cholesterol controlled with medications as prescribed, with individualized exercise RX and with personalized nutrition plan. Value goals: LDL < 70mg , HDL > 40 mg.       Tobacco Use Initial Evaluation: History  Smoking Status  . Never Smoker  Smokeless Tobacco  . Never Used    Copy of goals given to participant.
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
## 2016-05-23 ENCOUNTER — Other Ambulatory Visit: Payer: Self-pay | Admitting: *Deleted

## 2016-05-26 ENCOUNTER — Encounter: Payer: BLUE CROSS/BLUE SHIELD | Attending: Internal Medicine | Admitting: *Deleted

## 2016-05-26 DIAGNOSIS — I5022 Chronic systolic (congestive) heart failure: Secondary | ICD-10-CM | POA: Diagnosis not present

## 2016-05-26 NOTE — Progress Notes (Signed)
Daily Session Note  Patient Details  Name: Suzanne Spears MRN: [id number] Date of Birth: 10/20/1960 Referring Provider:     Cardiac Rehab from 05/22/2016 in [facility] Cardiac and Pulmonary Rehab  Referring Provider  Serafina Royals MD      Encounter Date: 05/26/2016  Check In:     Session Check In - 05/26/16 1629      Check-In   Location [facility]-Cardiac & Pulmonary Rehab   Staff Present Earlean Shawl, BS, ACSM CEP, Exercise Physiologist;Carroll Enterkin, RN, BSN;Susanne Bice, RN, BSN, CCRP   Supervising physician immediately available to respond to emergencies See telemetry face sheet for immediately available ER MD   Medication changes reported     No   Fall or balance concerns reported    No   Warm-up and Cool-down Performed on first and last piece of equipment   Resistance Training Performed Yes   VAD Patient? No     Pain Assessment   Currently in Pain? No/denies   Multiple Pain Sites No         History  Smoking Status  . Never Smoker  Smokeless Tobacco  . Never Used    Goals Met:  Exercise tolerated well Personal goals reviewed No report of cardiac concerns or symptoms Strength training completed today  Goals Unmet:  Not Applicable  Comments: First full day of exercise!  Patient was oriented to gym and equipment including functions, settings, policies, and procedures.  Patient's individual exercise prescription and treatment plan were reviewed.  All starting workloads were established based on the results of the 6 minute walk test done at initial orientation visit.  The plan for exercise progression was also introduced and progression will be customized based on patient's performance and goals.    Dr. Emily Filbert is Medical Director for [place] and [facility].

## 2016-05-28 ENCOUNTER — Encounter: Payer: BLUE CROSS/BLUE SHIELD | Admitting: *Deleted

## 2016-05-28 DIAGNOSIS — I5022 Chronic systolic (congestive) heart failure: Secondary | ICD-10-CM

## 2016-05-28 NOTE — Progress Notes (Signed)
Daily Session Note  Patient Details  Name: Suzanne Spears MRN: [id number] Date of Birth: 1960-08-03 Referring Provider:     Cardiac Rehab from 05/22/2016 in [facility] Cardiac and Pulmonary Rehab  Referring Provider  Serafina Royals MD      Encounter Date: 05/28/2016  Check In:     Session Check In - 05/28/16 1623      Check-In   Location [facility]-Cardiac & Pulmonary Rehab   Staff Present Gerlene Burdock, RN, Vickki Hearing, BA, ACSM CEP, Exercise Physiologist;Susanne Bice, RN, BSN, CCRP   Supervising physician immediately available to respond to emergencies See telemetry face sheet for immediately available ER MD   Medication changes reported     No   Fall or balance concerns reported    No   Warm-up and Cool-down Performed on first and last piece of equipment   Resistance Training Performed Yes   VAD Patient? No     Pain Assessment   Currently in Pain? No/denies           Exercise Prescription Changes - 05/28/16 1200      Response to Exercise   Blood Pressure (Admit) 124/70   Blood Pressure (Exercise) 148/82   Blood Pressure (Exit) 142/80   Heart Rate (Admit) 80 bpm   Heart Rate (Exercise) 117 bpm   Heart Rate (Exit) 92 bpm   Rating of Perceived Exertion (Exercise) 13   Duration Progress to 45 minutes of aerobic exercise without signs/symptoms of physical distress   Intensity THRR unchanged     Progression   Progression Continue to progress workloads to maintain intensity without signs/symptoms of physical distress.   Average METs 2.91     Resistance Training   Training Prescription Yes   Weight 2   Reps 10-15     Treadmill   MPH 2.5   Grade 0   Minutes 15   METs 2.91      History  Smoking Status  . Never Smoker  Smokeless Tobacco  . Never Used    Goals Met:  Proper associated with RPD/PD & O2 Sat Exercise tolerated well  Goals Unmet:  Not Applicable  Comments:     Dr. Emily Filbert is Medical Director for [place]  and [facility].

## 2016-05-30 ENCOUNTER — Telehealth: Payer: Self-pay | Admitting: Internal Medicine
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
## 2016-05-30 NOTE — Telephone Encounter (Signed)
My Chart message sent

## 2016-05-30 NOTE — Telephone Encounter (Signed)
Spoke with pt and she stated that she is not having any symptoms of c diff. Told pt that there isn't anything that she can take to prevent c diff. Pt stated that she would just like for you to know because of her congestive heart failure.

## 2016-06-05 ENCOUNTER — Encounter: Payer: Self-pay | Admitting: Internal Medicine

## 2016-06-05 MED ORDER — DULOXETINE HCL 30 MG PO CPEP: 30.0000 mg | ORAL_CAPSULE | Freq: Every day | ORAL | 3 refills | Status: DC

## 2016-06-05 NOTE — Telephone Encounter (Signed)
Yes, we can add cymbalta to citalopram.  I will call in a starting dose .  Can you make an appointment in 3-4 weeks to see me?  Regards,   Deborra Medina, MD

## 2016-06-06 ENCOUNTER — Telehealth: Payer: Self-pay | Admitting: Cardiovascular Disease

## 2016-06-06 ENCOUNTER — Ambulatory Visit (INDEPENDENT_AMBULATORY_CARE_PROVIDER_SITE_OTHER): Payer: BLUE CROSS/BLUE SHIELD | Admitting: Internal Medicine

## 2016-06-06 ENCOUNTER — Encounter: Payer: Self-pay | Admitting: Internal Medicine

## 2016-06-06 VITALS — BP 136/88 | HR 100 | Wt 178.0 lb

## 2016-06-06 DIAGNOSIS — J4520 Mild intermittent asthma, uncomplicated: Secondary | ICD-10-CM

## 2016-06-06 DIAGNOSIS — T17308A Unspecified foreign body in larynx causing other injury, initial encounter: Secondary | ICD-10-CM

## 2016-06-06 MED ORDER — FLUTICASONE FUROATE 100 MCG/ACT IN AEPB: 1.0000 | INHALATION_SPRAY | Freq: Every day | RESPIRATORY_TRACT | 5 refills | Status: DC

## 2016-06-06 MED ORDER — AZELASTINE-FLUTICASONE 137-50 MCG/ACT NA SUSP: 1.0000 | Freq: Two times a day (BID) | NASAL | 5 refills | Status: DC

## 2016-06-06 NOTE — Patient Instructions (Signed)
Start Zyrtec-stop allegra Start Dymista-stop flonase ENT referral for neck issues

## 2016-06-06 NOTE — Telephone Encounter (Signed)
Patient wants to continue seeing Dr. Caryl Comes instead of [place]. She says she received a call to schedule fu wth [place].

## 2016-06-06 NOTE — Progress Notes (Signed)
[facility] Consultation      MRN# [id number] EMALEY APPLIN 09-08-1960   CC: Follow up ASTHMA    Brief History: 56 year old female seen in consultation as transition of care from Dr. Raul Del office for mild to moderate asthma, previously saw [facility], then transferred to [facility], now back to [facility]. Medical history significant for left bundle branch block, mitral prolapse, dilated cardiomyopathy, asthma, hypertension, chronic systolic congestive heart failure, status post ICD placement, hx of pneumonia, which are all stable. Work allergens may aggravate cough, follow with ENT for nasal septum issues.    Events since last clinic visit: follow-up visit of mild intermittent asthma. On  Arnuity, and since then has had significant improvement in her cough and overall breathing. Overall with significant improvement since starting ICS. No fevers, no chills, no nausea, no vomiting, no diarrhea, no worsening dyspnea on exertion.  Has significant h/o Cardiomyopathy sees Dr. Caryl Comes No lower ext swelling No signs of infection at this time  Her allergic rhinitis does NOT seem to be under control Will change meds around  She also has complaints of neck issues with bulging soft tissue whne she coughs Has had anterior neck surgery 4 years ago  Medication:    Current Outpatient Prescriptions:  .  albuterol (PROVENTIL HFA;VENTOLIN HFA) 108 (90 BASE) MCG/ACT inhaler, Inhale 2 puffs into the lungs daily as needed for wheezing or shortness of breath., Disp: , Rfl:  .  albuterol (PROVENTIL) (2.5 MG/3ML) 0.083% nebulizer solution, Take 3 mLs (2.5 mg total) by nebulization every 6 (six) hours as needed for wheezing or shortness of breath., Disp: 150 mL, Rfl: 1 .  ARNUITY ELLIPTA 100 MCG/ACT AEPB, INHALE 1 PUFF BY MOUTH INTO THE LUNGS DAILY AS DIRECTED BY PHYSICIAN., Disp: 30 each, Rfl: 4 .  baclofen (LIORESAL) 10 MG tablet, Take 10 mg by mouth 2 (two) times daily. , Disp: , Rfl: 0 .   citalopram (CELEXA) 10 MG tablet, Take 1 tablet (10 mg total) by mouth daily., Disp: 30 tablet, Rfl: 4 .  cyanocobalamin (,VITAMIN B-12,) 1000 MCG/ML injection, INJECT 1 ML INTO THE MUSCLE ONCE A WEEK, Disp: 10 mL, Rfl: 2 .  DULoxetine (CYMBALTA) 30 MG capsule, Take 1 capsule (30 mg total) by mouth daily., Disp: 30 capsule, Rfl: 3 .  etodolac (LODINE) 500 MG tablet, Take 1 tablet (500 mg total) by mouth 2 (two) times daily., Disp: 60 tablet, Rfl: 3 .  fexofenadine (ALLEGRA) 180 MG tablet, Take 180 mg by mouth at bedtime., Disp: , Rfl:  .  fluticasone (FLONASE) 50 MCG/ACT nasal spray, USE 2 SPRAYS INTO BOTH NOSTRILS ONCE DAILY AS DIRECTED BY PHYSICIAN., Disp: 48 g, Rfl: 1 .  gabapentin (NEURONTIN) 100 MG capsule, Take 1 capsule (100 mg total) by mouth 3 (three) times daily., Disp: 90 capsule, Rfl: 3 .  HYDROcodone-acetaminophen (NORCO/VICODIN) 5-325 MG tablet, Take 1 tablet by mouth every 8 (eight) hours as needed for moderate pain., Disp: , Rfl:  .  JUNEL FE 1/20 1-20 MG-MCG tablet, TAKE ONE (1) TABLET BY MOUTH ONCE DAILY, Disp: 28 tablet, Rfl: 2 .  losartan (COZAAR) 50 MG tablet, Take 1 tablet (50 mg total) by mouth daily., Disp: 30 tablet, Rfl: 2 .  magnesium 30 MG tablet, Take 30 mg by mouth daily., Disp: , Rfl:  .  montelukast (SINGULAIR) 10 MG tablet, TAKE ONE (1) TABLET BY MOUTH ONCE DAILY, Disp: 30 tablet, Rfl: 5 .  traMADol (ULTRAM) 50 MG tablet, Take 1 tablet (50 mg total) by  mouth every 6 (six) hours as needed., Disp: 20 tablet, Rfl: 0 .  traZODone (DESYREL) 50 MG tablet, TAKE 1/2-1 TABLET BY MOUTH AT BEDTIME ASNEEDED FOR SLEEP, Disp: 30 tablet, Rfl: 5 .  TROKENDI XR 100 MG CP24, 100 mg daily. , Disp: , Rfl: 4 .  valACYclovir (VALTREX) 1000 MG tablet, Take 1 tablet (1,000 mg total) by mouth 2 (two) times daily as needed (fever blisters)., Disp: 20 tablet, Rfl: 3    Review of Systems  Constitutional: Negative for chills and fever.  HENT: Positive for congestion.   Eyes: Negative for  blurred vision and double vision.  Respiratory: Positive for cough.        Mild intermittent cough  Cardiovascular: Negative for chest pain.  Gastrointestinal: Negative for heartburn and nausea.  Genitourinary: Negative for dysuria.  Musculoskeletal: Negative for myalgias.  Neurological: Negative for dizziness and headaches.  Psychiatric/Behavioral: Nervous/anxious: .vs.       Allergies:  Adhesive [tape]; Coreg [carvedilol]; Metoprolol; Penicillin g; Prednisone; Latex; and Levofloxacin  Physical Examination:  BP 136/88 (BP Location: Left Arm, Cuff Size: Normal)   Pulse 100   Wt 178 lb (80.7 kg)   SpO2 99%   BMI 28.73 kg/m   General Appearance: No distress  HEENT: PERRLA, no ptosis, no other lesions noticed Pulmonary:normal breath sounds., diaphragmatic excursion normal.No wheezing, No rales   Cardiovascular:  Normal S1,S2.  No m/r/g.     Abdomen:Exam: Benign, Soft, non-tender, No masses  Skin:   warm, no rashes, no ecchymosis  Extremities: normal, no cyanosis, clubbing, warm with normal capillary refill.      07/09/15 PFTs - FEV1 80, FEV1/FVC 72, FEF 25-70 562, DLCO 91%. No significant obstruction,recurrent bronchodilator response, DLCO uncorrected within normal limits, normal curves. 6 minute walk test-no significant desaturations, lowest desaturation 96%, 1181 feet, 360 m    Assessment and Plan: 56 year old seen in follow-up for asthma-mild intemittent, well controlled at this time Asthma, chronic Previously seen by [facility] pulmonary, Dr. Lake Bells, and most recently seen by [facility] pulmonary Dr. Vella Kohler. She has transition care back to [facility] pulmonary. Her asthma is mild intermittent, it seems this is more seasonal and may have a work-related component to it. I'm not totally convinced that she needs to be on a year-round maintenance inhaler. But she does seem to have triggers especially in the winter and early spring season The above was discussed with the patient, and she  is in agreement with. She is currently on Arnuity with great improvement, therefore we will continue with this.  Pfts are essentially normal, with moderate decrease in FEF 25-75 62% (FEV1 80%, FEV1/FVC 72%) Normal 50mwt - no desats, walked 1135ft/360m   she claims that she has allergies to prednisone, but her descriptions are more in line with side effects of high-dose steroids.  I have discussed with her in the future if she does need prednisone, we can start off at lower dose, she will consider this when that time comes.    Plan: -Arunity 17mcg - 1 puff daily, -gargle and rinse after each use.  -Avoid noxious substances such as smoke, perfumes, and other sick contacts. -Continue with outpatient allergy regiment.  Allerghic Rhinitis-not under control -stop allegra, start zyrtec Stop flonase-start Dymista  Laryngeal Neck Issues-ENT referall to be made  Patient/Family are satisfied with Plan of action and management. All questions answered  Corrin Parker, M.D.  [facility]  Medical Director [place] Director [facility] Cardio-Pulmonary Department

## 2016-06-09 ENCOUNTER — Encounter: Payer: Self-pay | Admitting: *Deleted

## 2016-06-09 ENCOUNTER — Other Ambulatory Visit: Payer: Self-pay | Admitting: Internal Medicine

## 2016-06-09 DIAGNOSIS — I5022 Chronic systolic (congestive) heart failure: Secondary | ICD-10-CM

## 2016-06-09 NOTE — Progress Notes (Signed)
Incomplete Session Note  Patient Details  Name: Suzanne Spears MRN: [id number] Date of Birth: 27-Aug-1960 Referring Provider:     Cardiac Rehab from 05/22/2016 in [facility] Re[facility]  Referring Provider  Serafina Royals MD      [name] did not complete her rehab session.  Arrived to relate that she has had chest pain/fullness in her throat al day and did not sleep well last night because of symptoms.  Called her cardiologist office and was able to get an appointment now for  [facility]. She went direct to the office instead of attending class today.

## 2016-06-11 DIAGNOSIS — I5022 Chronic systolic (congestive) heart failure: Secondary | ICD-10-CM

## 2016-06-11 NOTE — Progress Notes (Signed)
Daily Session Note  Patient Details  Name: Suzanne Spears MRN: [id number] Date of Birth: 02-16-1961 Referring Provider:     Cardiac Rehab from 05/22/2016 in [facility] Cardiac and Pulmonary Rehab  Referring Provider  Serafina Royals MD      Encounter Date: 06/11/2016  Check In:     Session Check In - 06/11/16 1626      Check-In   Location [facility]-Cardiac & Pulmonary Rehab   Staff Present Gerlene Burdock, RN, BSN;Rosealynn Mateus, DPT, Burlene Arnt, BA, ACSM CEP, Exercise Physiologist   Supervising physician immediately available to respond to emergencies See telemetry face sheet for immediately available ER MD   Medication changes reported     No   Fall or balance concerns reported    No   Tobacco Cessation No Change   Warm-up and Cool-down Performed on first and last piece of equipment   Resistance Training Performed Yes   VAD Patient? No     Pain Assessment   Currently in Pain? No/denies   Multiple Pain Sites No           Exercise Prescription Changes - 06/11/16 1300      Response to Exercise   Blood Pressure (Admit) 112/72   Blood Pressure (Exercise) 144/80   Blood Pressure (Exit) 146/80   Heart Rate (Admit) 92 bpm   Heart Rate (Exercise) 113 bpm   Heart Rate (Exit) 96 bpm   Rating of Perceived Exertion (Exercise) 14   Duration Progress to 45 minutes of aerobic exercise without signs/symptoms of physical distress   Intensity THRR unchanged     Progression   Progression Continue to progress workloads to maintain intensity without signs/symptoms of physical distress.   Average METs 2.26     Resistance Training   Training Prescription Yes   Weight 2   Reps 10-15     Treadmill   MPH 2.5   Grade 0   Minutes 15   METs 2.91     REL-XR   Level 1   Minutes 15   METs 1.6      History  Smoking Status  . Never Smoker  Smokeless Tobacco  . Never Used    Goals Met:  Exercise tolerated well  Goals Unmet:  Not Applicable  Comments: Patient  completed exercise prescription and all exercise goals during rehab session. The exercise was tolerated well and the patient is progressing in the program.    Dr. Emily Filbert is Medical Director for [place] and [facility].

## 2016-06-12 DIAGNOSIS — I5022 Chronic systolic (congestive) heart failure: Secondary | ICD-10-CM | POA: Diagnosis not present

## 2016-06-12 NOTE — Progress Notes (Signed)
Daily Session Note  Patient Details  Name: Suzanne Spears MRN: [id number] Date of Birth: [birth year] Referring Provider:     Cardiac Rehab from 05/22/2016 in [facility] Cardiac and Pulmonary Rehab  Referring Provider  Serafina Royals MD      Encounter Date: 06/12/2016  Check In:     Session Check In - 06/12/16 1703      Check-In   Location [facility]-Cardiac & Pulmonary Rehab   Staff Present Nyoka Cowden, RN, BSN, [place], BS, ACSM CEP, Exercise Physiologist;Amanda Oletta Darter, [place], ACSM CEP, Exercise Physiologist   Supervising physician immediately available to respond to emergencies See telemetry face sheet for immediately available ER MD   Medication changes reported     No   Fall or balance concerns reported    No   Warm-up and Cool-down Performed on first and last piece of equipment   Resistance Training Performed Yes   VAD Patient? No     Pain Assessment   Currently in Pain? No/denies   Multiple Pain Sites No         History  Smoking Status  . Never Smoker  Smokeless Tobacco  . Never Used    Goals Met:  Independence with exercise equipment Exercise tolerated well No report of cardiac concerns or symptoms Strength training completed today  Goals Unmet:  Not Applicable  Comments: Pt able to follow exercise prescription today without complaint.  Will continue to monitor for progression.    Dr. Emily Filbert is Medical Director for [facility] and [facility].

## 2016-06-13 ENCOUNTER — Other Ambulatory Visit: Payer: Self-pay | Admitting: Internal Medicine

## 2016-06-16 ENCOUNTER — Encounter: Payer: BLUE CROSS/BLUE SHIELD | Admitting: *Deleted

## 2016-06-16 DIAGNOSIS — I5022 Chronic systolic (congestive) heart failure: Secondary | ICD-10-CM

## 2016-06-16 NOTE — Progress Notes (Signed)
Daily Session Note  Patient Details  Name: Suzanne Spears MRN: [id number] Date of Birth: Jul 13, 1960 Referring Provider:     Cardiac Rehab from 05/22/2016 in [facility] Cardiac and Pulmonary Rehab  Referring Provider  Serafina Royals MD      Encounter Date: 06/16/2016  Check In:     Session Check In - 06/16/16 1619      Check-In   Location [facility]-Cardiac & Pulmonary Rehab   Staff Present Heath Lark, RN, BSN, CCRP;Carroll Enterkin, RN, Moises Blood, BS, ACSM CEP, Exercise Physiologist   Supervising physician immediately available to respond to emergencies See telemetry face sheet for immediately available ER MD   Medication changes reported     No   Fall or balance concerns reported    No   Warm-up and Cool-down Performed on first and last piece of equipment   Resistance Training Performed Yes   VAD Patient? No     Pain Assessment   Currently in Pain? No/denies   Multiple Pain Sites No           Exercise Prescription Changes - 06/16/16 1700      Response to Exercise   Duration Progress to 45 minutes of aerobic exercise without signs/symptoms of physical distress   Intensity THRR unchanged     Progression   Progression Continue to progress workloads to maintain intensity without signs/symptoms of physical distress.   Average METs 2.26     Resistance Training   Training Prescription Yes   Weight 2   Reps 10-15     Treadmill   MPH 2.5   Grade 0   Minutes 15   METs 2.91     REL-XR   Level 1   Minutes 15   METs 1.6     Home Exercise Plan   Plans to continue exercise at Home (comment)  treadmill at home and handweights   Frequency Add 2 additional days to program exercise sessions.   Initial Home Exercises Provided 06/16/16      History  Smoking Status  . Never Smoker  Smokeless Tobacco  . Never Used    Goals Met:  Independence with exercise equipment Exercise tolerated well Personal goals reviewed No report of cardiac concerns or  symptoms Strength training completed today  Goals Unmet:  Not Applicable  Comments: Pt able to follow exercise prescription today without complaint.  Will continue to monitor for progression. Keane plans to continue to exercise at home on her treadmill/using handweights on days that he does not come to cardiac rehab. Home exercise guidelines were reviewed with patient today.    Dr. Emily Filbert is Medical Director for [facility] and [facility].

## 2016-06-18 ENCOUNTER — Encounter: Payer: BLUE CROSS/BLUE SHIELD | Admitting: *Deleted

## 2016-06-18 ENCOUNTER — Encounter: Payer: Self-pay | Admitting: *Deleted

## 2016-06-18 DIAGNOSIS — I5022 Chronic systolic (congestive) heart failure: Secondary | ICD-10-CM

## 2016-06-18 NOTE — Progress Notes (Signed)
Daily Session Note  Patient Details  Name: Suzanne Spears MRN: [id number] Date of Birth: 12-05-1960 Referring Provider:     Cardiac Rehab from 05/22/2016 in [facility] Cardiac and Pulmonary Rehab  Referring Provider  Serafina Royals MD      Encounter Date: 06/18/2016  Check In:     Session Check In - 06/18/16 1627      Check-In   Location [facility]-Cardiac & Pulmonary Rehab   Staff Present Gerlene Burdock, RN, Vickki Hearing, BA, ACSM CEP, Exercise Physiologist;Mary Kellie Shropshire, RN, BSN, MA   Supervising physician immediately available to respond to emergencies See telemetry face sheet for immediately available ER MD   Medication changes reported     No   Fall or balance concerns reported    No   Warm-up and Cool-down Performed on first and last piece of equipment   Resistance Training Performed Yes   VAD Patient? No     Pain Assessment   Currently in Pain? No/denies         History  Smoking Status  . Never Smoker  Smokeless Tobacco  . Never Used    Goals Met:  Proper associated with RPD/PD & O2 Sat Exercise tolerated well  Goals Unmet:  Not Applicable  Comments:     Dr. Emily Filbert is Medical Director for [place] and [facility].

## 2016-06-18 NOTE — Progress Notes (Signed)
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 store meds and maintain the prescription regimen.   Cardiac Rehab from 06/16/2016 in [facility] Cardiac and Pulmonary Rehab  Date  06/11/16  Educator  CE  Instruction Review Code  2- meets goals/outcomes      Go Sex-Intimacy & Heart Disease, Get SMART - Goal Setting: - Group verbal and written instruction through game format to discuss heart disease and the return to sexual intimacy. Provides group verbal and written material to discuss and apply goal setting through the application of the S.M.A.R.T. Method.   Other Matters of the Heart: - Provides group verbal, written materials and models to describe Heart Failure, Angina, Valve Disease, and Diabetes in the realm of heart disease. Includes description of the disease process and treatment options available to the cardiac patient.   Cardiac Rehab from 06/16/2016 in [facility] Cardiac and Pulmonary Rehab  Date  05/26/16  Educator  CE  Instruction Review Code  2- meets goals/outcomes      Exercise & Equipment Safety: - Individual verbal instruction and demonstration of equipment use and safety  with use of the equipment.   Cardiac Rehab from 06/16/2016 in [facility] Cardiac and Pulmonary Rehab  Date  05/22/16  Educator  SB  Instruction Review Code  2- meets goals/outcomes  Infection Prevention: - Provides verbal and written material to individual with discussion of infection control including proper hand washing and proper equipment cleaning during exercise session.   Cardiac Rehab from 06/16/2016 in [facility] Cardiac and Pulmonary Rehab  Date  05/22/16  Educator  Sb  Instruction Review Code  2- meets goals/outcomes      Falls Prevention: - Provides verbal and written material to individual with discussion of falls prevention and safety.   Cardiac Rehab from 06/16/2016 in [facility] Cardiac and Pulmonary Rehab  Date  05/22/16  Educator  SB  Instruction Review Code  2- meets goals/outcomes      Diabetes: - Individual verbal and written instruction to review signs/symptoms of diabetes, desired ranges of glucose level fasting, after meals and with exercise. Advice that pre and post exercise glucose checks will be done for 3 sessions at entry of program.    Knowledge Questionnaire Score:     Knowledge Questionnaire Score - 05/22/16 1547      Knowledge Questionnaire Score   Pre Score 20/28  Reviewed correct responses and talked to [facility] about the eduction sessions that will provide the answers as she attend the program.      Core Components/Risk Factors/Patient Goals at Admission:     Personal Goals and Risk Factors at Admission - 05/22/16 1539      Core Components/Risk Factors/Patient Goals on Admission   Improve shortness of breath with ADL's Yes   Intervention Provide education, individualized exercise plan and daily activity instruction to help decrease symptoms of SOB with activities of daily living.   Expected Outcomes Short Term: Achieves a reduction of symptoms when performing activities of daily living.   Heart Failure Yes   Intervention Provide a combined exercise  and nutrition program that is supplemented with education, support and counseling about heart failure. Directed toward relieving symptoms such as shortness of breath, decreased exercise tolerance, and extremity edema.   Expected Outcomes Improve functional capacity of life;Short term: Attendance in program 2-3 days a week with increased exercise capacity. Reported lower sodium intake. Reported increased fruit and vegetable intake. Reports medication compliance.;Short term: Daily weights obtained and reported for increase. Utilizing diuretic protocols set by physician.;Long term: Adoption of self-care skills and reduction of barriers for early signs and symptoms recognition and intervention leading to self-care maintenance.   Lipids Yes   Intervention Provide education and support for participant on nutrition & aerobic/resistive exercise along with prescribed medications to achieve LDL <36m, HDL >44m   Expected Outcomes Short Term: Participant states understanding of desired cholesterol values and is compliant with medications prescribed. Participant is following exercise prescription and nutrition guidelines.;Long Term: Cholesterol controlled with medications as prescribed, with individualized exercise RX and with personalized nutrition plan. Value goals: LDL < 7098mHDL > 40 mg.      Core Components/Risk Factors/Patient Goals Review:      Goals and Risk Factor Review    Row Name 05/28/16 1719 05/28/16 1721           Core Components/Risk Factors/Patient Goals Review   Personal Goals Review Heart Failure;Hypertension  -      Review EliMikenaeaArbutus Pedeports the MD told her she doesn't have to check her weight since she has the defib in and he follows her weight thru that. KeaArbutus Pedports she usually doesn't have any swelling but knows if she does or more Sob or weight gain to call MD.  Blood pressure is doing well.       Expected Outcomes  -  Heart healthy lifestyle         Core  Components/Risk Factors/Patient Goals at Discharge (Final Review):      Goals and Risk Factor Review - 05/28/16 1721      Core Components/Risk Factors/Patient Goals Review   Review Blood pressure is doing well.    Expected Outcomes Heart healthy lifestyle      ITP Comments:     ITP Comments    Row Name 05/22/16 1529 05/28/16 1718 06/18/16 0620       ITP Comments Medical review completed. Initial ITP created. Documentation of diagnosis can be found in care everywhere Office Visit 04/21/2016 "Suzanne Spears" reports her Mother in law has gone down hill fast from asst living, nursing home to hospice today with kidney failure.  30 day review. Continue with ITP unless directed changes per Medical Director review.  Recent appointment with ENT for throaat pressure. All normal, cough from Cozaar.        Comments:

## 2016-06-19 DIAGNOSIS — I5022 Chronic systolic (congestive) heart failure: Secondary | ICD-10-CM

## 2016-06-19 NOTE — Progress Notes (Signed)
Daily Session Note  Patient Details  Name: Suzanne Spears MRN: [id number] Date of Birth: 1960/07/25 Referring Provider:     Cardiac Rehab from 05/22/2016 in [facility] Cardiac and Pulmonary Rehab  Referring Provider  Serafina Royals MD      Encounter Date: 06/19/2016  Check In:     Session Check In - 06/19/16 1651      Check-In   Location [facility]-Cardiac & Pulmonary Rehab   Staff Present Earlean Shawl, BS, ACSM CEP, Exercise Physiologist;Carroll Enterkin, RN, Vickki Hearing, BA, ACSM CEP, Exercise Physiologist   Supervising physician immediately available to respond to emergencies See telemetry face sheet for immediately available ER MD   Medication changes reported     No   Fall or balance concerns reported    No   Warm-up and Cool-down Performed on first and last piece of equipment   Resistance Training Performed Yes   VAD Patient? No     Pain Assessment   Currently in Pain? No/denies         History  Smoking Status  . Never Smoker  Smokeless Tobacco  . Never Used    Goals Met:  Independence with exercise equipment Exercise tolerated well No report of cardiac concerns or symptoms Strength training completed today  Goals Unmet:  Not Applicable  Comments: Pt able to follow exercise prescription today without complaint.  Will continue to monitor for progression.    Dr. Emily Filbert is Medical Director for [place] and [facility].

## 2016-06-23 ENCOUNTER — Telehealth: Payer: Self-pay | Admitting: *Deleted

## 2016-06-23 NOTE — Telephone Encounter (Signed)
Suzanne Spears called to say she would miss class today because of a headache.

## 2016-06-25 ENCOUNTER — Encounter: Payer: BLUE CROSS/BLUE SHIELD | Attending: Internal Medicine

## 2016-06-25 DIAGNOSIS — I5022 Chronic systolic (congestive) heart failure: Secondary | ICD-10-CM | POA: Diagnosis not present

## 2016-06-25 NOTE — Progress Notes (Signed)
Daily Session Note  Patient Details  Name: Suzanne Spears MRN: [id number] Date of Birth: [birth year] Referring Provider:     Cardiac Rehab from 05/22/2016 in [facility] Cardiac and Pulmonary Rehab  Referring Provider  Serafina Royals MD      Encounter Date: 06/25/2016  Check In:     Session Check In - 06/25/16 1620      Check-In   Location [facility]-Cardiac & Pulmonary Rehab   Staff Present Heath Lark, RN, BSN, CCRP;Rebecca Sickles, DPT, Burlene Arnt, BA, ACSM CEP, Exercise Physiologist   Supervising physician immediately available to respond to emergencies See telemetry face sheet for immediately available ER MD   Medication changes reported     No   Fall or balance concerns reported    No   Tobacco Cessation No Change   Warm-up and Cool-down Performed on first and last piece of equipment   Resistance Training Performed Yes   VAD Patient? No     Pain Assessment   Currently in Pain? No/denies           Exercise Prescription Changes - 06/25/16 1200      Response to Exercise   Blood Pressure (Admit) 124/74   Blood Pressure (Exercise) 130/80   Blood Pressure (Exit) 122/70   Heart Rate (Admit) 75 bpm   Heart Rate (Exercise) 134 bpm   Heart Rate (Exit) 73 bpm   Rating of Perceived Exertion (Exercise) 13   Duration Continue with 45 min of aerobic exercise without signs/symptoms of physical distress.     Progression   Progression Continue to progress workloads to maintain intensity without signs/symptoms of physical distress.   Average METs 2.49     Resistance Training   Training Prescription No   Weight 2   Reps 10-15     Treadmill   MPH 2.7   Grade 0   Minutes 15   METs 3.07     Recumbant Bike   Level 2   Minutes 15   METs 1.9      History  Smoking Status  . Never Smoker  Smokeless Tobacco  . Never Used    Goals Met:  Independence with exercise equipment  Goals Unmet:  Not Applicable  Comments: [name] continues to have some chest and shoulder  pain  - see exercise and ITP comments.  She is following up with her Dr.   Dr. Emily Filbert is Medical Director for [place] and [facility].

## 2016-06-26 ENCOUNTER — Encounter: Payer: BLUE CROSS/BLUE SHIELD | Admitting: *Deleted

## 2016-06-26 VITALS — BP 114/70 | HR 80 | Wt 178.0 lb

## 2016-06-26 DIAGNOSIS — I5022 Chronic systolic (congestive) heart failure: Secondary | ICD-10-CM | POA: Diagnosis not present

## 2016-06-26 NOTE — Progress Notes (Signed)
Daily Session Note  Patient Details  Name: Suzanne Spears MRN: [id number] Date of Birth: 23-Nov-1960 Referring Provider:     Cardiac Rehab from 05/22/2016 in [facility] Cardiac and Pulmonary Rehab  Referring Provider  Serafina Royals MD      Encounter Date: 06/26/2016  Check In:     Session Check In - 06/26/16 1705      Check-In   Location [facility]-Cardiac & Pulmonary Rehab   Staff Present Gerlene Burdock, RN, Vickki Hearing, BA, ACSM CEP, Exercise Physiologist;Kelly Amedeo Plenty, BS, ACSM CEP, Exercise Physiologist   Supervising physician immediately available to respond to emergencies See telemetry face sheet for immediately available ER MD   Medication changes reported     No   Fall or balance concerns reported    No   Warm-up and Cool-down Performed on first and last piece of equipment   Resistance Training Performed Yes   VAD Patient? No     Pain Assessment   Currently in Pain? Yes   Pain Score 6    Pain Location --  [name] walked in and said today she had chest pain moving to right arm like a muscle spasm. During the day [name] reports during the day she had muscle spasms between her shoulder blades.          History  Smoking Status  . Never Smoker  Smokeless Tobacco  . Never Used    Goals Met:  Proper associated with RPD/PD & O2 Sat  Goals Unmet:  Not Applicable  Comments:  Not having chest pain currently during Cardiac Rehab will exercising lightly seated on the recumbent bike.    Dr. Emily Filbert is Medical Director for [place] and [facility].

## 2016-06-27 ENCOUNTER — Telehealth: Payer: Self-pay | Admitting: Internal Medicine

## 2016-06-27 NOTE — Telephone Encounter (Signed)
Pt states she is having a lot of back "spasms" . SerShe aslo states she had an episode yesterday where she had this bad pain in her chest, and went into her back. States it lasted for a couple minutes. States she doesn't do anything and starts having this pain. States she currently sees Dr. Nehemiah Massed, but doesn't want to continue to see him. She would like to switch to DR. Rockey Situ, also sees Caryl Comes for EP, she was not sure if Dr. Caryl Comes should address this problem, or if she should schedule as a NP with Rockey Situ

## 2016-06-27 NOTE — Telephone Encounter (Signed)
Called patient an had patient describe symptoms , patient stated they happen during activity at cardio rehab feels like muscle spasms in back that radiate to chest patient advised she should call cardiologist office now and advised cardiology , patient agreed stating she will call Dr. Caryl Comes

## 2016-06-27 NOTE — Telephone Encounter (Signed)
Suzanne Spears - she will be a new pt with [name] and since she is transferring care, she should be placed on one of the MDs schedules.

## 2016-06-27 NOTE — Telephone Encounter (Signed)
Spoke with patient and she states that she has been having episodes of back spasms and cramp like feelings. The most recent was today at lunch time and it went away. Offered her first appointment with Ignacia Bayley NP on 07/16/16 at 1:30PM and will also place her on waiting list if any cancellations, she was agreeable with this. She denied any chest pain at this time and instructed her that if the chest pain should persist or worsen to go to emergency room. She verbalized understanding of our conversation, agreement with plan, and had no further questions at this time.

## 2016-06-30 ENCOUNTER — Encounter: Payer: BLUE CROSS/BLUE SHIELD | Admitting: *Deleted

## 2016-06-30 DIAGNOSIS — I5022 Chronic systolic (congestive) heart failure: Secondary | ICD-10-CM

## 2016-06-30 NOTE — Progress Notes (Signed)
Daily Session Note  Patient Details  Name: Suzanne Spears MRN: [id number] Date of Birth: [birth year] Referring Provider:     Cardiac Rehab from 05/22/2016 in [facility] Cardiac and Pulmonary Rehab  Referring Provider  Serafina Royals MD      Encounter Date: 06/30/2016  Check In:     Session Check In - 06/30/16 1638      Check-In   Location [facility]-Cardiac & Pulmonary Rehab   Staff Present Heath Lark, RN, BSN, Laveda Norman, BS, ACSM CEP, Exercise Physiologist;Mary Kellie Shropshire, RN, BSN, MA   Supervising physician immediately available to respond to emergencies See telemetry face sheet for immediately available ER MD   Medication changes reported     No   Fall or balance concerns reported    No   Warm-up and Cool-down Performed on first and last piece of equipment   Resistance Training Performed Yes   VAD Patient? No     Pain Assessment   Currently in Pain? No/denies   Multiple Pain Sites No         History  Smoking Status  . Never Smoker  Smokeless Tobacco  . Never Used    Goals Met:  Independence with exercise equipment Exercise tolerated well No report of cardiac concerns or symptoms Strength training completed today  Goals Unmet:  Not Applicable  Comments: Pt able to follow exercise prescription today without complaint.  Will continue to monitor for progression.    Dr. Emily Filbert is Medical Director for [place] and [facility].

## 2016-07-01 ENCOUNTER — Telehealth: Payer: Self-pay | Admitting: *Deleted

## 2016-07-01 NOTE — Telephone Encounter (Signed)
Patient requested to have a late Monday appt for a medication follow up.  Pt contact [phone]

## 2016-07-02 ENCOUNTER — Encounter: Payer: BLUE CROSS/BLUE SHIELD | Admitting: *Deleted

## 2016-07-02 DIAGNOSIS — I5022 Chronic systolic (congestive) heart failure: Secondary | ICD-10-CM | POA: Diagnosis not present

## 2016-07-02 NOTE — Telephone Encounter (Signed)
Can you cancel the appt on May 14th @ 6:00pm and reschedule pt on may 21st @ 6:30pm per Dr. Derrel Nip. Thank you!!

## 2016-07-02 NOTE — Telephone Encounter (Signed)
Is it ok to schedule pt for Monday May 14th @ 6:00pm?

## 2016-07-02 NOTE — Telephone Encounter (Signed)
Spoke with pt to confirm appt and she stated that she has some kind of cardio rehab every Monday until 6pm. Would it be ok to move pt to the next Monday the 21st @ 6:30pm.

## 2016-07-02 NOTE — Telephone Encounter (Signed)
Yes

## 2016-07-02 NOTE — Progress Notes (Signed)
Daily Session Note  Patient Details  Name: ANALYAH MCCONNON MRN: [id number] Date of Birth: September 24, 1960 Referring Provider:     Cardiac Rehab from 05/22/2016 in [facility] Cardiac and Pulmonary Rehab  Referring Provider  Serafina Royals MD      Encounter Date: 07/02/2016  Check In:     Session Check In - 07/02/16 1624      Check-In   Location [facility]-Cardiac & Pulmonary Rehab   Staff Present Gerlene Burdock, RN, Vickki Hearing, BA, ACSM CEP, Exercise Physiologist;Mary Kellie Shropshire, RN, BSN, MA   Supervising physician immediately available to respond to emergencies See telemetry face sheet for immediately available ER MD   Medication changes reported     No   Fall or balance concerns reported    No   Tobacco Cessation No Change   Warm-up and Cool-down Performed on first and last piece of equipment   Resistance Training Performed Yes   VAD Patient? No     Pain Assessment   Currently in Pain? No/denies         History  Smoking Status  . Never Smoker  Smokeless Tobacco  . Never Used    Goals Met:  Proper associated with RPD/PD & O2 Sat Exercise tolerated well Personal goals reviewed  Goals Unmet:  Not Applicable  Comments:     Dr. Emily Filbert is Medical Director for [place] and [facility].

## 2016-07-02 NOTE — Telephone Encounter (Signed)
yes

## 2016-07-02 NOTE — Telephone Encounter (Signed)
Can you schedule this for me please. Thank you!

## 2016-07-02 NOTE — Telephone Encounter (Signed)
Scheduled

## 2016-07-03 DIAGNOSIS — I5022 Chronic systolic (congestive) heart failure: Secondary | ICD-10-CM | POA: Diagnosis not present

## 2016-07-03 NOTE — Telephone Encounter (Signed)
Appointment changed

## 2016-07-03 NOTE — Progress Notes (Signed)
Daily Session Note  Patient Details  Name: Suzanne Spears MRN: [id number] Date of Birth: May 02, 1960 Referring Provider:     Cardiac Rehab from 05/22/2016 in [facility] Cardiac [facility]monary Rehab  Referring Provider  Serafina Royals MD      Encounter Date: 07/03/2016  Check In:     Session Check In - 07/03/16 1636      Check-In   Location [facility]-Cardiac & Pulmonary Rehab   Staff Present Earlean Shawl, BS, ACSM CEP, Exercise Physiologist;Kailynne Ferrington Oletta Darter, BA, ACSM CEP, Exercise Physiologist;Carroll Enterkin, RN, BSN   Supervising physician immediately available to respond to emergencies See telemetry face sheet for immediately available ER MD   Medication changes reported     No   Fall or balance concerns reported    No   Warm-up and Cool-down Performed on first and last piece of equipment   Resistance Training Performed Yes   VAD Patient? No     Pain Assessment   Currently in Pain? No/denies         History  Smoking Status  . Never Smoker  Smokeless Tobacco  . Never Used    Goals Met:  Independence with exercise equipment Exercise tolerated well No report of cardiac concerns or symptoms Strength training completed today  Goals Unmet:  Not Applicable  Comments: Pt able to follow exercise prescription today without complaint.  Will continue to monitor for progression.    Dr. Emily Filbert is Medical Director for [place] and [facility].

## 2016-07-07 ENCOUNTER — Other Ambulatory Visit: Payer: Self-pay | Admitting: Internal Medicine

## 2016-07-07 ENCOUNTER — Ambulatory Visit: Payer: BLUE CROSS/BLUE SHIELD | Admitting: Internal Medicine

## 2016-07-07 ENCOUNTER — Encounter: Payer: BLUE CROSS/BLUE SHIELD | Admitting: *Deleted

## 2016-07-07 DIAGNOSIS — I5022 Chronic systolic (congestive) heart failure: Secondary | ICD-10-CM | POA: Diagnosis not present

## 2016-07-07 NOTE — Progress Notes (Signed)
Daily Session Note  Patient Details  Name: Suzanne Spears MRN: [id number] Date of Birth: 1960/04/16 Referring Provider:     Cardiac Rehab from 05/22/2016 in [facility] Cardiac and Pulmonary Rehab  Referring Provider  Serafina Royals MD      Encounter Date: 07/07/2016  Check In:     Session Check In - 07/07/16 1639      Check-In   Staff Present Nyoka Cowden, RN, BSN, MA;Susanne Bice, RN, BSN, Laveda Norman, BS, ACSM CEP, Exercise Physiologist   Supervising physician immediately available to respond to emergencies See telemetry face sheet for immediately available ER MD   Medication changes reported     No   Fall or balance concerns reported    No   Warm-up and Cool-down Performed on first and last piece of equipment   Resistance Training Performed Yes   VAD Patient? No     Pain Assessment   Currently in Pain? No/denies         History  Smoking Status  . Never Smoker  Smokeless Tobacco  . Never Used    Goals Met:  Independence with exercise equipment Exercise tolerated well No report of cardiac concerns or symptoms Strength training completed today  Goals Unmet:  Not Applicable  Comments: Doing well with exercise prescription progression.    Dr. Emily Filbert is Medical Director for [place] and [facility].

## 2016-07-08 ENCOUNTER — Other Ambulatory Visit: Payer: Self-pay | Admitting: Internal Medicine

## 2016-07-09 ENCOUNTER — Encounter: Payer: BLUE CROSS/BLUE SHIELD | Admitting: *Deleted

## 2016-07-09 DIAGNOSIS — I5022 Chronic systolic (congestive) heart failure: Secondary | ICD-10-CM | POA: Diagnosis not present

## 2016-07-09 NOTE — Progress Notes (Signed)
Daily Session Note  Patient Details  Name: Suzanne Spears MRN: [id number] Date of Birth: 1960-10-05 Referring Provider:     Cardiac Rehab from 05/22/2016 in [facility] Cardiac and Pulmonary Rehab  Referring Provider  Serafina Royals MD      Encounter Date: 07/09/2016  Check In:     Session Check In - 07/09/16 1619      Check-In   Location [facility]-Cardiac & Pulmonary Rehab   Staff Present Nyoka Cowden, RN, BSN, MA;Lamon Rotundo, RN, Vickki Hearing, BA, ACSM CEP, Exercise Physiologist   Supervising physician immediately available to respond to emergencies See telemetry face sheet for immediately available ER MD   Medication changes reported     No   Fall or balance concerns reported    No   Tobacco Cessation No Change   Warm-up and Cool-down Performed on first and last piece of equipment   Resistance Training Performed Yes   VAD Patient? No     Pain Assessment   Currently in Pain? No/denies           Exercise Prescription Changes - 07/09/16 1300      Response to Exercise   Blood Pressure (Admit) 110/64   Blood Pressure (Exercise) 142/70   Blood Pressure (Exit) 118/70   Heart Rate (Admit) 64 bpm   Heart Rate (Exercise) 125 bpm   Heart Rate (Exit) 85 bpm   Rating of Perceived Exertion (Exercise) 15   Duration Continue with 45 min of aerobic exercise without signs/symptoms of physical distress.   Intensity THRR unchanged     Progression   Progression Continue to progress workloads to maintain intensity without signs/symptoms of physical distress.   Average METs 3.21     Resistance Training   Training Prescription Yes   Weight 2   Reps 10-15     Treadmill   MPH 2.7   Grade 1   Minutes 15   METs 3.44     Recumbant Bike   Minutes 15   METs 2.98      History  Smoking Status  . Never Smoker  Smokeless Tobacco  . Never Used    Goals Met:  Proper associated with RPD/PD & O2 Sat Exercise tolerated well No report of cardiac concerns or  symptoms  Goals Unmet:  Not Applicable  Comments:     Dr. Emily Filbert is Medical Director for [place] and [facility].

## 2016-07-10 DIAGNOSIS — I5022 Chronic systolic (congestive) heart failure: Secondary | ICD-10-CM

## 2016-07-10 NOTE — Progress Notes (Signed)
Daily Session Note  Patient Details  Name: RUKIYA HODGKINS MRN: [id number] Date of Birth: 12/09/1960 Referring Provider:     Cardiac Rehab from 05/22/2016 in [facility] Cardiac and Pulmonary Rehab  Referring Provider  Serafina Royals MD      Encounter Date: 07/10/2016  Check In:     Session Check In - 07/10/16 1647      Check-In   Location [facility]-Cardiac & Pulmonary Rehab   Staff Present Gerlene Burdock, RN, Moises Blood, BS, ACSM CEP, Exercise Physiologist;Amanda Oletta Darter, [place], ACSM CEP, Exercise Physiologist   Supervising physician immediately available to respond to emergencies See telemetry face sheet for immediately available ER MD   Medication changes reported     No   Fall or balance concerns reported    No   Warm-up and Cool-down Performed on first and last piece of equipment   Resistance Training Performed Yes   VAD Patient? No     Pain Assessment   Currently in Pain? No/denies         History  Smoking Status  . Never Smoker  Smokeless Tobacco  . Never Used    Goals Met:  Independence with exercise equipment Exercise tolerated well  Goals Unmet:  Not Applicable  Comments: .[name] had a headache and left early today.   Dr. Emily Filbert is Medical Director for [place] and [facility].

## 2016-07-14 ENCOUNTER — Encounter: Payer: Self-pay | Admitting: Internal Medicine

## 2016-07-14 ENCOUNTER — Ambulatory Visit (INDEPENDENT_AMBULATORY_CARE_PROVIDER_SITE_OTHER): Payer: BLUE CROSS/BLUE SHIELD | Admitting: Internal Medicine

## 2016-07-14 DIAGNOSIS — F418 Other specified anxiety disorders: Secondary | ICD-10-CM

## 2016-07-14 MED ORDER — CITALOPRAM HYDROBROMIDE 20 MG PO TABS: 20.0000 mg | ORAL_TABLET | Freq: Every day | ORAL | 2 refills | Status: DC

## 2016-07-14 NOTE — Progress Notes (Signed)
Subjective:  Patient ID: Suzanne Spears, female    DOB: 1960-07-19  Age: 56 y.o. MRN: [id number]  CC: The encounter diagnosis was Depression with anxiety.  HPI Suzanne Spears presents for follow up on GAD   She  is currently taking citalopram and  cymbalta .   She has been less irritable since starting the combination but continues to have irritability , trouble sleeping,  Frustration.  She reports an absence of libido.She citres home stressors , concerns  about her health ,  And wor stressors all  Contributing    Outpatient Medications Prior to Visit  Medication Sig Dispense Refill  . albuterol (PROVENTIL HFA;VENTOLIN HFA) 108 (90 BASE) MCG/ACT inhaler Inhale 2 puffs into the lungs daily as needed for wheezing or shortness of breath.    Marland Kitchen albuterol (PROVENTIL) (2.5 MG/3ML) 0.083% nebulizer solution Take 3 mLs (2.5 mg total) by nebulization every 6 (six) hours as needed for wheezing or shortness of breath. 150 mL 1  . ARNUITY ELLIPTA 100 MCG/ACT AEPB INHALE 1 PUFF BY MOUTH INTO THE LUNGS DAILY AS DIRECTED BY PHYSICIAN. 30 each 4  . Azelastine-Fluticasone 137-50 MCG/ACT SUSP Place 1 spray into the nose 2 (two) times daily. 23 g 5  . baclofen (LIORESAL) 10 MG tablet Take 10 mg by mouth 2 (two) times daily.   0  . cyanocobalamin (,VITAMIN B-12,) 1000 MCG/ML injection INJECT 1 ML INTO THE MUSCLE ONCE A WEEK 4 mL 3  . DULoxetine (CYMBALTA) 30 MG capsule Take 1 capsule (30 mg total) by mouth daily. 30 capsule 3  . etodolac (LODINE) 500 MG tablet Take 1 tablet (500 mg total) by mouth 2 (two) times daily. 60 tablet 3  . fexofenadine (ALLEGRA) 180 MG tablet Take 180 mg by mouth at bedtime.    . fluticasone (FLONASE) 50 MCG/ACT nasal spray USE 2 SPRAYS INTO BOTH NOSTRILS ONCE DAILY AS DIRECTED BY PHYSICIAN. 48 g 2  . Fluticasone Furoate (ARNUITY ELLIPTA) 100 MCG/ACT AEPB Inhale 1 puff into the lungs daily. 30 each 5  . gabapentin (NEURONTIN) 100 MG capsule Take 1 capsule (100 mg total) by mouth  3 (three) times daily. 90 capsule 3  . HYDROcodone-acetaminophen (NORCO/VICODIN) 5-325 MG tablet Take 1 tablet by mouth every 8 (eight) hours as needed for moderate pain.    Suzanne Spears FE 1/20 1-20 MG-MCG tablet TAKE (1) TABLET BY MOUTH EVERY DAY 1 Package 1  . losartan (COZAAR) 50 MG tablet TAKE (1) TABLET BY MOUTH EVERY DAY 30 tablet 3  . magnesium 30 MG tablet Take 30 mg by mouth daily.    . montelukast (SINGULAIR) 10 MG tablet TAKE ONE (1) TABLET BY MOUTH ONCE DAILY 30 tablet 5  . traMADol (ULTRAM) 50 MG tablet Take 1 tablet (50 mg total) by mouth every 6 (six) hours as needed. 20 tablet 0  . traZODone (DESYREL) 50 MG tablet TAKE 1/2-1 TABLET BY MOUTH AT BEDTIME ASNEEDED FOR SLEEP 30 tablet 5  . TROKENDI XR 100 MG CP24 100 mg daily.   4  . valACYclovir (VALTREX) 1000 MG tablet Take 1 tablet (1,000 mg total) by mouth 2 (two) times daily as needed (fever blisters). 20 tablet 3  . citalopram (CELEXA) 10 MG tablet Take 1 tablet (10 mg total) by mouth daily. 30 tablet 4   No facility-administered medications prior to visit.     Review of Systems;  Patient denies headache, fevers, malaise, unintentional weight loss, skin rash, eye pain, sinus congestion and sinus pain, sore throat,  dysphagia,  hemoptysis , cough, dyspnea, wheezing, chest pain, palpitations, orthopnea, edema, abdominal pain, nausea, melena, diarrhea, constipation, flank pain, dysuria, hematuria, urinary  Frequency, nocturia, numbness, tingling, seizures,  Focal weakness, Loss of consciousness,  Tremor, insomnia, depression, anxiety, and suicidal ideation.      Objective:  BP 110/72   Pulse 95   Temp 98.3 F (36.8 C) (Oral)   Resp 16   Ht 5\' 6"  (1.676 m)   Wt 176 lb 4 oz (79.9 kg)   SpO2 98%   BMI 28.45 kg/m   BP Readings from Last 3 Encounters:  07/14/16 110/72  06/26/16 114/70  06/06/16 136/88    Wt Readings from Last 3 Encounters:  07/14/16 176 lb 4 oz (79.9 kg)  06/26/16 178 lb (80.7 kg)  06/06/16 178 lb (80.7  kg)    General appearance: alert, cooperative and appears stated age Ears: normal TM's and external ear canals both ears Throat: lips, mucosa, and tongue normal; teeth and gums normal Neck: no adenopathy, no carotid bruit, supple, symmetrical, trachea midline and thyroid not enlarged, symmetric, no tenderness/mass/nodules Back: symmetric, no curvature. ROM normal. No CVA tenderness. Lungs: clear to auscultation bilaterally Heart: regular rate and rhythm, S1, S2 normal, no murmur, click, rub or gallop Abdomen: soft, non-tender; bowel sounds normal; no masses,  no organomegaly Pulses: 2+ and symmetric Skin: Skin color, texture, turgor normal. No rashes or lesions Lymph nodes: Cervical, supraclavicular, and axillary nodes normal. Psych: affect normal, makes good eye contact. No fidgeting,  Smiles easily.  Denies suicidal thoughts   No results found for: HGBA1C  Lab Results  Component Value Date   CREATININE 1.18 (H) 03/31/2016   CREATININE 1.16 (H) 02/24/2016   CREATININE 1.19 08/27/2015    Lab Results  Component Value Date   WBC 9.0 03/31/2016   HGB 13.4 03/31/2016   HCT 39.3 03/31/2016   PLT 206 03/31/2016   GLUCOSE 91 03/31/2016   CHOL 236 (H) 08/17/2015   TRIG 164.0 (H) 08/17/2015   HDL 50.90 08/17/2015   LDLDIRECT 151.6 05/01/2011   LDLCALC 152 (H) 08/17/2015   ALT 10 08/17/2015   AST 13 08/17/2015   NA 137 03/31/2016   K 3.4 (L) 03/31/2016   CL 108 03/31/2016   CREATININE 1.18 (H) 03/31/2016   BUN 15 03/31/2016   CO2 22 03/31/2016   TSH 3.01 08/17/2015   INR 2.8 03/27/2015    Ct Orbits W Contrast  Result Date: 05/15/2016 CLINICAL DATA:  Initial evaluation for 1 month history of right eye pain. EXAM: CT ORBITS WITH CONTRAST TECHNIQUE: Multidetector CT images was performed according to the standard protocol following intravenous contrast administration. CONTRAST:  75 cc of Isovue-300. COMPARISON:  Prior CT from 03/31/2016. FINDINGS: Orbits: Globes are symmetric  with normal size and contour bilaterally. Lenses normally positioned. Optic nerves symmetric and normal. Extra-ocular muscles symmetric and normal in appearance bilaterally. Lacrimal glands normal. Superior orbital veins symmetric and normal. Intraconal and extraconal fat well-preserved. Orbital apices within normal limits. No abnormality seen about the cavernous sinus. Optic chiasm normally positioned within the suprasellar cistern. Visualized sinuses: Visualized paranasal sinuses are clear. Visualized mastoids are clear. Middle ear cavities well pneumatized. Soft tissues: Periorbital soft tissues within normal limits. Remainder the visualized soft tissues of the face unremarkable. Limited intracranial: Unremarkable. IMPRESSION: Normal CT of the orbits. No mass lesion or other abnormality identified. Electronically Signed   By: Jeannine Boga M.D.   On: 05/15/2016 16:38    Assessment & Plan:   Problem List  Items Addressed This Visit    Depression with anxiety    Symptoms improving but not resolved.  Increase citalopram to 20 mg daily  Continue cymbalta 30 mg daily for now.         A total of 25 minutes of face to face time was spent with patient more than half of which was spent in counselling about  Her current mood disorder and the expected result of modifying her regimen.      I have changed Suzanne Spears's citalopram. I am also having her maintain her fexofenadine, magnesium, albuterol, traZODone, valACYclovir, TROKENDI XR, baclofen, traMADol, albuterol, etodolac, montelukast, ARNUITY ELLIPTA, HYDROcodone-acetaminophen, gabapentin, DULoxetine, Fluticasone Furoate, Azelastine-Fluticasone, JUNEL FE 1/20, losartan, cyanocobalamin, and fluticasone.  Meds ordered this encounter  Medications  . citalopram (CELEXA) 20 MG tablet    Sig: Take 1 tablet (20 mg total) by mouth daily.    Dispense:  30 tablet    Refill:  2    Medications Discontinued During This Encounter  Medication Reason  .  citalopram (CELEXA) 10 MG tablet Reorder    Follow-up: No Follow-up on file.   [name], MD

## 2016-07-14 NOTE — Patient Instructions (Signed)
I am increasing your dose of citalopram  To 20 mg daily  Continue the cymbalta for now at 30 mg daily.  We can increase the dose to 60 mg if you do not feel any better after 2 weeks of the increased citalopram dose

## 2016-07-16 ENCOUNTER — Ambulatory Visit: Payer: BLUE CROSS/BLUE SHIELD | Admitting: Nurse Practitioner

## 2016-07-16 ENCOUNTER — Encounter: Payer: Self-pay | Admitting: *Deleted

## 2016-07-16 DIAGNOSIS — I5022 Chronic systolic (congestive) heart failure: Secondary | ICD-10-CM

## 2016-07-16 NOTE — Assessment & Plan Note (Signed)
Symptoms improving but not resolved.  Increase citalopram to 20 mg daily  Continue cymbalta 30 mg daily for now.

## 2016-07-16 NOTE — Progress Notes (Signed)
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 practice provided.   Stress Management: - Provides group verbal and written instruction about the health risks of elevated stress, cause of high stress, and healthy ways Spears reduce stress.   Cardiac Rehab from 07/09/2016 in [facility] Cardiac and Pulmonary Rehab  Date  05/28/16  Educator  Lucianne Lei, MSW  Instruction Review Code  2- meets goals/outcomes      Depression: - Provides group verbal and written instruction on the correlation between heart/lung disease and depressed mood, treatment options, and the stigmas associated with seeking treatment.   Cardiac Rehab from 07/09/2016 in [facility] Cardiac and Pulmonary Rehab  Date  06/25/16  Educator  [facility]  Instruction Review Code  2- meets goals/outcomes      Anatomy & Physiology of the Heart: - Group verbal and written instruction and models provide basic cardiac anatomy and physiology, with the coronary electrical and arterial systems. Review of: AMI, Angina, Valve disease, Heart Failure, Cardiac Arrhythmia, Pacemakers, and the ICD.   Cardiac Rehab from 07/09/2016 in [facility] Cardiac and Pulmonary Rehab  Date  05/26/16  Educator  CE  Instruction Review Code  2- meets goals/outcomes      Cardiac Procedures: - Group verbal and written instruction and models Spears  describe the testing methods done Spears diagnose heart disease. Reviews the outcomes of the test results. Describes the treatment choices: Medical Management, Angioplasty, or Coronary Bypass Surgery.   Cardiac Medications: - Group verbal and written instruction Spears review commonly prescribed medications for heart disease. Reviews the medication, class of the drug, and side effects. Includes the steps Spears properly store meds and maintain the prescription regimen.   Cardiac Rehab from 07/09/2016 in [facility] Cardiac and Pulmonary Rehab  Date  06/11/16  Educator  CE  Instruction Review Code  2- meets goals/outcomes      Go Sex-Intimacy & Heart Disease, Get SMART - Goal Setting: - Group verbal and written instruction through game format Spears discuss heart disease and the return Spears sexual intimacy. Provides group verbal and written material Spears discuss and apply goal setting through the application of the S.M.A.R.T. Method.   Other Matters of the Heart: - Provides group verbal, written materials and models Spears describe Heart Failure, Angina, Valve Disease, and Diabetes in the realm of heart disease. Includes description of the disease process and treatment options available Spears the cardiac patient.   Cardiac Rehab from 07/09/2016 in [facility] Cardiac and Pulmonary Rehab  Date  07/02/16 Suzanne Spears was given written information about heart failure]  Educator  [place]  Instruction Review Code  2- meets goals/outcomes      Exercise & Equipment Safety: - Individual verbal instruction and demonstration of equipment use and safety with use of the equipment.   Cardiac Rehab from 07/09/2016 in [facility] Cardiac and Pulmonary Rehab  Date  05/22/16  Educator  SB  Instruction Review Code  2- meets goals/outcomes      Infection Prevention: - Provides verbal and written material Spears individual with discussion of infection control including proper hand washing and proper equipment cleaning during exercise session.   Cardiac Rehab from  07/09/2016 in [facility] Cardiac and Pulmonary Rehab  Date  05/22/16  Educator  Sb  Instruction Review Code  2- meets goals/outcomes      Falls Prevention: - Provides verbal and written material Spears individual with discussion of falls prevention and safety.   Cardiac Rehab from 07/09/2016 in [facility] Cardiac and Pulmonary Rehab  Date  05/22/16  Educator  SB  Instruction Review Code  2- meets goals/outcomes      Diabetes: - Individual verbal and written instruction Spears review signs/symptoms of diabetes, desired ranges of glucose level fasting, after meals and with exercise. Advice that pre and post exercise glucose checks will be done for 3 sessions at entry of program.    Knowledge Questionnaire Score:     Knowledge Questionnaire Score - 05/22/16 1547      Knowledge Questionnaire Score   Pre Score 20/28  Reviewed correct responses and talked Spears Suzanne Spears about the eduction sessions that will provide the answers as she attend the program.      Core  Components/Risk Factors/Patient Goals at Admission:     Personal Goals and Risk Factors at Admission - 05/22/16 1539      Core Components/Risk Factors/Patient Goals on Admission   Improve shortness of breath with ADL's Yes   Intervention Provide education, individualized exercise plan and daily activity instruction Spears help decrease symptoms of SOB with activities of daily living.   Expected Outcomes Short Term: Achieves a reduction of symptoms when performing activities of daily living.   Heart Failure Yes   Intervention Provide a combined exercise and nutrition program that is supplemented with education, support and counseling about heart failure. Directed toward relieving symptoms such as shortness of breath, decreased exercise tolerance, and extremity edema.   Expected Outcomes Improve functional capacity of life;Short term: Attendance in program 2-3 days a week with increased exercise capacity. Reported lower sodium intake. Reported increased  fruit and vegetable intake. Reports medication compliance.;Short term: Daily weights obtained and reported for increase. Utilizing diuretic protocols set by physician.;Long term: Adoption of self-care skills and reduction of barriers for early signs and symptoms recognition and intervention leading Spears self-care maintenance.   Lipids Yes   Intervention Provide education and support for participant on nutrition & aerobic/resistive exercise along with prescribed medications Spears achieve LDL <15m, HDL >453m   Expected Outcomes Short Term: Participant states understanding of desired cholesterol values and is compliant with medications prescribed. Participant is following exercise prescription and nutrition guidelines.;Long Term: Cholesterol controlled with medications as prescribed, with individualized exercise RX and with personalized nutrition plan. Value goals: LDL < 7043mHDL > 40 mg.      Core Components/Risk Factors/Patient Goals Review:      Goals and Risk Factor Review    Row Name 05/28/16 1719 05/28/16 1721 07/02/16 1627         Core Components/Risk Factors/Patient Goals Review   Personal Goals Review Heart Failure;Hypertension  - Heart Failure;Hypertension     Review EliSineadeaArbutus Spears the MD told her she doesn't have Spears check her weight since she has the defib in and he follows her weight thru that. Suzanne Spears she usually doesn't have any swelling but knows if she does or more Sob or weight gain Spears call MD.  Blood pressure is doing well.  Suzanne Spears she understood better now about her heart failure after I explained it more Spears her and gave her written information. Suzanne Spears(Kenisha)'s blood pressure has been very good.     Expected Outcomes  - Heart healthy lifestyle Cont Spears improve her healthy lifestyle steps.         Core Components/Risk Factors/Patient Goals at Discharge (Final Review):      Goals and Risk Factor Review - 07/02/16 1627      Core Components/Risk  Factors/Patient Goals Review   Personal Goals Review Heart Failure;Hypertension   Review Suzanne Spears she understood better now about her heart failure after I explained it more Spears her and gave her written information. Suzanne Spears blood pressure has been very good.   Expected Outcomes Cont Spears improve her healthy lifestyle steps.       ITP Comments:     ITP Comments    Row Name 05/22/16 1529 05/28/16 1718 06/18/16 0620 06/26/16 1701 06/26/16 1707   ITP Comments Medical review completed. Initial ITP created. Documentation of diagnosis can be found in care everywhere Office Visit 04/21/2016 "Suzanne Spears her Mother in law has gone down hill fast from asst living, nursing home Spears hospice today with kidney failure.  30 day review.  Continue with ITP unless directed changes per Medical Director review.  Recent appointment with ENT for throaat pressure. All normal, cough from Cozaar. Suzanne walked in and said today she had chest pain moving Spears right arm like a muscle spasm. During the day Suzanne Spears reports during the day she had muscle spasms between her shoulder blades.  Faxed information Spears Dr. Perry Mount reports that her chest pain today moving Spears her right arm like a muscle spasm has happened several times before.    [place] Name 07/02/16 1625 07/16/16 0914         ITP Comments "Suzanne Spears" was called by Dr. Lupita Dawn office and we were notified that Suzanne Spears is suppose Spears check with her cardiologist about her chest pain. Suzanne Spears has an upcoming appt with the Cardiologist Dr. Rockey Situ. Suzanne Spears thanked me for the printed information that I gave her last time about Heart Failure,. She said it was very helpful and has decreased her stress some just by knowing the information.  30 day review. Continue with ITP unless directed changes per Medical Director review         Comments:

## 2016-07-17 ENCOUNTER — Telehealth: Payer: Self-pay

## 2016-07-17 NOTE — Telephone Encounter (Signed)
Suzanne Spears did not attend Heart Track 5/23 due to having a cortisone shot in her neck.

## 2016-07-23 DIAGNOSIS — I5022 Chronic systolic (congestive) heart failure: Secondary | ICD-10-CM

## 2016-07-23 NOTE — Progress Notes (Signed)
Daily Session Note  Patient Details  Name: SILVA AAMODT MRN: [id number] Date of Birth: [birth year] Referring Provider:     Cardiac Rehab from 05/22/2016 in [facility] Cardiac and Pulmonary Rehab  Referring Provider  Serafina Royals MD      Encounter Date: 07/23/2016  Check In:     Session Check In - 07/23/16 1633      Check-In   Location [facility]-Cardiac & Pulmonary Rehab   Staff Present Gerlene Burdock, RN, Vickki Hearing, BA, ACSM CEP, Exercise Physiologist;Other  Renita Papa RN   Supervising physician immediately available to respond to emergencies See telemetry face sheet for immediately available ER MD   Medication changes reported     Yes   Comments Lasix addded due to fluid gain over the weekend   Fall or balance concerns reported    No   Warm-up and Cool-down Performed on first and last piece of equipment   Resistance Training Performed Yes   VAD Patient? No     Pain Assessment   Currently in Pain? No/denies         History  Smoking Status  . Never Smoker  Smokeless Tobacco  . Never Used    Goals Met:  Independence with exercise equipment Exercise tolerated well No report of cardiac concerns or symptoms Strength training completed today  Goals Unmet:  Not Applicable  Comments: Pt able to follow exercise prescription today without complaint.  Will continue to monitor for progression.    Dr. Emily Filbert is Medical Director for [place] and [facility].

## 2016-07-24 ENCOUNTER — Encounter: Payer: BLUE CROSS/BLUE SHIELD | Admitting: *Deleted

## 2016-07-24 DIAGNOSIS — I5022 Chronic systolic (congestive) heart failure: Secondary | ICD-10-CM | POA: Diagnosis not present

## 2016-07-24 NOTE — Progress Notes (Signed)
Daily Session Note  Patient Details  Name: Suzanne Spears MRN: [id number] Date of Birth: 07-18-1960 Referring Provider:     Cardiac Rehab from 05/22/2016 in [facility] Cardiac and Pulmonary Rehab  Referring Provider  Serafina Royals MD      Encounter Date: 07/24/2016  Check In:     Session Check In - 07/24/16 1650      Check-In   Location [facility]-Cardiac & Pulmonary Rehab   Staff Present Gerlene Burdock, RN, Vickki Hearing, BA, ACSM CEP, Exercise Physiologist;Susanne Bice, RN, BSN, CCRP   Supervising physician immediately available to respond to emergencies See telemetry face sheet for immediately available ER MD   Medication changes reported     No   Fall or balance concerns reported    No   Tobacco Cessation No Change   Warm-up and Cool-down Performed on first and last piece of equipment   Resistance Training Performed Yes   VAD Patient? No     Pain Assessment   Currently in Pain? Yes   Pain Score 6    Pain Location Neck   Pain Type Chronic pain   Pain Onset More than a month ago   Pain Frequency Constant   Aggravating Factors  Dog pulled her last night on his leash   Effect of Pain on Daily Activities took a muscle relaxant           Exercise Prescription Changes - 07/24/16 1000      Response to Exercise   Blood Pressure (Admit) 108/66   Blood Pressure (Exercise) 130/84   Blood Pressure (Exit) 122/78   Heart Rate (Admit) 85 bpm   Heart Rate (Exercise) 122 bpm   Heart Rate (Exit) 96 bpm   Rating of Perceived Exertion (Exercise) 15   Duration Continue with 45 min of aerobic exercise without signs/symptoms of physical distress.   Intensity THRR unchanged     Progression   Progression Continue to progress workloads to maintain intensity without signs/symptoms of physical distress.     Resistance Training   Training Prescription Yes   Weight 2   Reps 10-15     Treadmill   MPH 2.8   Grade 1   Minutes 15   METs 3.53     Recumbant Bike   Level 2   Minutes  15   METs 2.98      History  Smoking Status  . Never Smoker  Smokeless Tobacco  . Never Used    Goals Met:  Proper associated with RPD/PD & O2 Sat Exercise tolerated well No report of cardiac concerns or symptoms  Goals Unmet:  Not Applicable  Comments:     Dr. Emily Filbert is Medical Director for [place] and [facility].

## 2016-07-25 ENCOUNTER — Encounter: Payer: Self-pay | Admitting: Internal Medicine

## 2016-07-25 NOTE — Telephone Encounter (Signed)
Please advise filled by Dorise Hiss PA, 03/07/16 ?

## 2016-07-26 ENCOUNTER — Ambulatory Visit (INDEPENDENT_AMBULATORY_CARE_PROVIDER_SITE_OTHER): Payer: BLUE CROSS/BLUE SHIELD

## 2016-07-26 ENCOUNTER — Ambulatory Visit
Admission: EM | Admit: 2016-07-26 | Discharge: 2016-07-26 | Disposition: A | Discharge status: Home / Self Care | Payer: BLUE CROSS/BLUE SHIELD | Attending: Family Medicine | Admitting: Family Medicine

## 2016-07-26 DIAGNOSIS — M659 Synovitis and tenosynovitis, unspecified: Secondary | ICD-10-CM | POA: Diagnosis not present

## 2016-07-26 DIAGNOSIS — S838X2A Sprain of other specified parts of left knee, initial encounter: Secondary | ICD-10-CM

## 2016-07-26 DIAGNOSIS — S93492A Sprain of other ligament of left ankle, initial encounter: Secondary | ICD-10-CM
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
## 2016-07-26 NOTE — ED Triage Notes (Signed)
Pt reports on Wednesday night she was bringing her dogs back from a walk and twisted her leg. Left knee and left ankle pain. No fall or popping. Pain 5/10

## 2016-07-27 ENCOUNTER — Other Ambulatory Visit: Payer: Self-pay | Admitting: Internal Medicine

## 2016-07-27 MED ORDER — ETODOLAC 500 MG PO TABS: 500.0000 mg | ORAL_TABLET | Freq: Two times a day (BID) | ORAL | 3 refills | Status: DC

## 2016-07-27 NOTE — Telephone Encounter (Signed)
Patient requested tramado, refill, which I have authorized  Please print

## 2016-07-28 ENCOUNTER — Encounter: Payer: Self-pay | Admitting: *Deleted

## 2016-07-28 ENCOUNTER — Telehealth: Payer: Self-pay | Admitting: *Deleted

## 2016-07-28 ENCOUNTER — Encounter: Payer: BLUE CROSS/BLUE SHIELD | Attending: Internal Medicine

## 2016-07-28 DIAGNOSIS — I5022 Chronic systolic (congestive) heart failure: Secondary | ICD-10-CM

## 2016-07-28 MED ORDER — TRAMADOL HCL 50 MG PO TABS: 50.0000 mg | ORAL_TABLET | Freq: Four times a day (QID) | ORAL | 2 refills | Status: DC | PRN

## 2016-07-28 NOTE — Telephone Encounter (Signed)
Suzanne Spears called to let us know that she injured her knee over the weekend.  They think its just a sprain on her medial menicus but have asked her wait until she is seen on Thursday before exercising.  She will also be out all of next week on vacation.

## 2016-07-28 NOTE — Telephone Encounter (Signed)
Refilled: 03/07/2016 Last OV: 07/14/2016 Next OV: Not scheduled.

## 2016-07-28 NOTE — Telephone Encounter (Signed)
Tramadol refill printed

## 2016-07-29 ENCOUNTER — Ambulatory Visit (INDEPENDENT_AMBULATORY_CARE_PROVIDER_SITE_OTHER): Payer: BLUE CROSS/BLUE SHIELD | Admitting: Internal Medicine

## 2016-07-29 ENCOUNTER — Encounter: Payer: Self-pay | Admitting: Internal Medicine

## 2016-07-29 VITALS — BP 120/70 | HR 76 | Ht 65.0 in | Wt 173.8 lb

## 2016-07-29 DIAGNOSIS — I447 Left bundle-branch block, unspecified: Secondary | ICD-10-CM

## 2016-07-29 DIAGNOSIS — I428 Other cardiomyopathies: Secondary | ICD-10-CM

## 2016-07-29 DIAGNOSIS — Z95810 Presence of automatic (implantable) cardiac defibrillator: Secondary | ICD-10-CM | POA: Diagnosis not present

## 2016-07-29 DIAGNOSIS — I5022 Chronic systolic (congestive) heart failure: Secondary | ICD-10-CM

## 2016-07-29 NOTE — Patient Instructions (Signed)
Medication Instructions: - Your physician recommends that you continue on your current medications as directed. Please refer to the Current Medication list given to you today.  Labwork: - none ordered  Procedures/Testing: - Your physician has requested that you have an echocardiogram. Echocardiography is a painless test that uses sound waves to create images of your heart. It provides your doctor with information about the size and shape of your heart and how well your heart's chambers and valves are working. This procedure takes approximately one hour. There are no restrictions for this procedure.  Follow-Up: - Remote monitoring is used to monitor your Pacemaker of ICD from home. This monitoring reduces the number of office visits required to check your device to one time per year. It allows [name] to keep an eye on the functioning of your device to ensure it is working properly. You are scheduled for a device check from home on 08/29/16 (fluid level only) & 10/28/16. You may send your transmission at any time that day. If you have a wireless device, the transmission will be sent automatically. After your physician reviews your transmission, you will receive a postcard with your next transmission date.  - Your physician wants you to follow-up in: 6 months with Dr. Caryl Comes. You will receive a reminder letter in the mail two months in advance. If you don't receive a letter, please call our office to schedule the follow-up appointment.   Any Additional Special Instructions Will Be Listed Below (If Applicable).     If you need a refill on your cardiac medications before your next appointment, please call your pharmacy.

## 2016-07-29 NOTE — Progress Notes (Signed)
Patient referred to [facility] clinic by Levander Campion, device RN.  Spoke with patient in office and provided ICM intro and she agreed to monthly ICM follow up.  She reported a couple of weeks ago she had fluid symptoms of bilateral leg swelling and stomach bloating but was not on a diuretic at that time.  Since then she has visited Dr Sharyne Peach, cardiologist and was prescribed Furosemide 20 mg 1 tablet as needed which alleviate her fluid symptoms.  Provided ICM direct number and encouraged to call should symptoms occur.  1st ICM remote transmission scheduled for 08/29/2016.
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
## 2016-08-04 ENCOUNTER — Telehealth: Payer: Self-pay
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
## 2016-08-08 ENCOUNTER — Other Ambulatory Visit: Payer: Self-pay | Admitting: Internal Medicine

## 2016-08-11 ENCOUNTER — Telehealth: Payer: Self-pay

## 2016-08-11 ENCOUNTER — Other Ambulatory Visit: Payer: Self-pay | Admitting: Internal Medicine

## 2016-08-11 NOTE — Telephone Encounter (Signed)
I spoke with [name] and we will have her graduate early due to her injury.  She will stop by Wednesday to pick up her notebook.  She feels the program has been very helpful and is disappointed she is injured.

## 2016-08-11 NOTE — Telephone Encounter (Signed)
Suzanne Spears called to say she cannot attend this week due to injury to her knee and ankle. She wants to graduate but cant walk at this time.  I will speak with [name] about options and call her back.

## 2016-08-13 ENCOUNTER — Encounter: Payer: Self-pay | Admitting: *Deleted

## 2016-08-13 DIAGNOSIS — I5022 Chronic systolic (congestive) heart failure: Secondary | ICD-10-CM

## 2016-08-13 NOTE — Progress Notes (Signed)
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  Instruction Review Code  2- meets goals/outcomes      Flexibility, Balance, General Exercise Guidelines: - Provides group verbal and written instruction on the benefits of flexibility and balance training programs. Provides general exercise guidelines with specific guidelines to those with heart or lung disease. Demonstration and skill practice provided.   Stress Management: - Provides group verbal and written instruction about the health risks of elevated stress, cause of high stress, and healthy  ways to reduce stress.   Cardiac Rehab from 07/23/2016 in [facility] Cardiac and Pulmonary Rehab  Date  07/23/16  Educator  Lucianne Lei, MSW  Instruction Review Code  2- meets goals/outcomes      Depression: - Provides group verbal and written instruction on the correlation between heart/lung disease and depressed mood, treatment options, and the stigmas associated with seeking treatment.   Cardiac Rehab from 07/23/2016 in [facility] Cardiac and Pulmonary Rehab  Date  06/25/16  Educator  [facility]   Instruction Review Code  2- meets goals/outcomes      Anatomy & Physiology of the Heart: - Group verbal and written instruction and models provide basic cardiac anatomy and physiology, with the coronary electrical and arterial systems. Review of: AMI, Angina, Valve disease, Heart Failure, Cardiac Arrhythmia, Pacemakers, and the ICD.   Cardiac Rehab from 07/23/2016 in [facility] Cardiac and Pulmonary Rehab  Date  05/26/16  Educator  CE  Instruction Review Code  2- meets goals/outcomes      Cardiac Procedures: - Group verbal and written instruction and models to describe the testing methods done to diagnose heart disease. Reviews the outcomes of the test results. Describes the treatment choices: Medical Management, Angioplasty, or Coronary Bypass Surgery.   Cardiac Medications: - Group verbal and written instruction to review commonly prescribed medications for heart disease. Reviews the medication, class of the drug, and side effects. Includes the steps to properly store meds and maintain the prescription regimen.   Cardiac Rehab from 07/23/2016 in [facility] Cardiac and Pulmonary Rehab  Date  06/11/16  Educator  CE  Instruction Review Code  2- meets goals/outcomes      Go Sex-Intimacy & Heart Disease, Get SMART - Goal Setting: - Group verbal and written instruction through game format to discuss heart disease and the return to sexual intimacy. Provides group verbal and written material to discuss and apply goal setting through the application of the S.M.A.R.T. Method.   Other Matters of the Heart: - Provides group verbal, written materials and models to describe Heart Failure, Angina, Valve Disease, and Diabetes in the realm of heart disease. Includes description of the disease process and treatment options available to the cardiac patient.   Cardiac Rehab from 07/23/2016 in [facility] Cardiac and Pulmonary Rehab  Date  07/02/16 Suzanne Spears was given written information about heart failure]  Educator  [place]   Instruction Review Code  2- meets goals/outcomes      Exercise & Equipment Safety: - Individual verbal instruction and demonstration of equipment use and safety with use of the equipment.   Cardiac Rehab from 07/23/2016 in [facility] Cardiac and Pulmonary Rehab  Date  05/22/16  Educator  SB  Instruction Review Code  2- meets goals/outcomes      Infection Prevention: - Provides verbal and written material to individual with discussion of infection control including proper hand washing and proper equipment cleaning during exercise session.   Cardiac Rehab from 07/23/2016 in [facility] Cardiac and Pulmonary Rehab  Date  05/22/16  Educator  Sb  Instruction Review Code  2- meets goals/outcomes      Falls Prevention: - Provides verbal and written material to individual with discussion of falls prevention and safety.   Cardiac Rehab from 07/23/2016 in [facility] Cardiac and Pulmonary Rehab  Date  05/22/16  Educator  SB  Instruction Review Code  2- meets goals/outcomes      Diabetes: - Individual verbal and written instruction to review signs/symptoms of diabetes, desired ranges of glucose level fasting, after meals  and with exercise. Advice that pre and post exercise glucose checks will be done for 3 sessions at entry of program.    Knowledge Questionnaire Score:     Knowledge Questionnaire Score - 05/22/16 1547      Knowledge Questionnaire Score   Pre Score 20/28  Reviewed correct responses and talked to [facility] about the eduction sessions that will provide the answers as she attend the program.      Core Components/Risk Factors/Patient Goals at Admission:     Personal Goals and Risk Factors at Admission - 05/22/16 1539      Core Components/Risk Factors/Patient Goals on Admission   Improve shortness of breath with ADL's Yes   Intervention Provide education, individualized exercise plan and daily activity instruction to help decrease symptoms of SOB with activities of daily living.    Expected Outcomes Short Term: Achieves a reduction of symptoms when performing activities of daily living.   Heart Failure Yes   Intervention Provide a combined exercise and nutrition program that is supplemented with education, support and counseling about heart failure. Directed toward relieving symptoms such as shortness of breath, decreased exercise tolerance, and extremity edema.   Expected Outcomes Improve functional capacity of life;Short term: Attendance in program 2-3 days a week with increased exercise capacity. Reported lower sodium intake. Reported increased fruit and vegetable intake. Reports medication compliance.;Short term: Daily weights obtained and reported for increase. Utilizing diuretic protocols set by physician.;Long term: Adoption of self-care skills and reduction of barriers for early signs and symptoms recognition and intervention leading to self-care maintenance.   Lipids Yes   Intervention Provide education and support for participant on nutrition & aerobic/resistive exercise along with prescribed medications to achieve LDL <62m, HDL >46m   Expected Outcomes Short Term: Participant states understanding of desired cholesterol values and is compliant with medications prescribed. Participant is following exercise prescription and nutrition guidelines.;Long Term: Cholesterol controlled with medications as prescribed, with individualized exercise RX and with personalized nutrition plan. Value goals: LDL < 7075mHDL > 40 mg.      Core Components/Risk Factors/Patient Goals Review:      Goals and Risk Factor Review    Row Name 05/28/16 1719 05/28/16 1721 07/02/16 1627         Core Components/Risk Factors/Patient Goals Review   Personal Goals Review Heart Failure;Hypertension  - Heart Failure;Hypertension     Review EliHadeneaArbutus Pedeports the MD told her she doesn't have to check her weight since she has the defib in and he follows her weight thru that. Suzanne Pedports she  usually doesn't have any swelling but knows if she does or more Sob or weight gain to call MD.  Blood pressure is doing well.  Suzanne Pedid she understood better now about her heart failure after I explained it more to her and gave her written information. Keane's(Khilynn)'s blood pressure has been very good.     Expected Outcomes  - Heart healthy lifestyle Cont to improve her healthy lifestyle steps.         Core Components/Risk Factors/Patient Goals at Discharge (Final Review):      Goals and Risk Factor Review - 07/02/16 1627      Core Components/Risk Factors/Patient Goals Review   Personal Goals Review Heart Failure;Hypertension   Review Suzanne Pedid she understood better now about her heart failure after I explained it more to her and gave her written information. Keane's(Dilpreet)'s blood pressure has been very good.   Expected Outcomes Cont to improve her healthy lifestyle  steps.       ITP Comments:     ITP Comments    Row Name 05/22/16 1529 05/28/16 1718 06/18/16 0620 06/26/16 1701 06/26/16 1707   ITP Comments Medical review completed. Initial ITP created. Documentation of diagnosis can be found in care everywhere Office Visit 04/21/2016 "Suzanne Spears" reports her Mother in law has gone down hill fast from asst living, nursing home to hospice today with kidney failure.  30 day review. Continue with ITP unless directed changes per Medical Director review.  Recent appointment with ENT for throaat pressure. All normal, cough from Cozaar. Keane walked in and said today she had chest pain moving to right arm like a muscle spasm. During the day Suzanne Spears reports during the day she had muscle spasms between her shoulder blades.  Faxed information to Dr. Perry Mount reports that her chest pain today moving to her right arm like a muscle spasm has happened several times before.    Row Name 07/02/16 1625 07/16/16 0914 07/28/16 1443 08/13/16 0557 08/13/16 0558   ITP Comments "Suzanne Spears" was called by Dr. Lupita Dawn  office and we were notified that Suzanne Spears is suppose to check with her cardiologist about her chest pain. Suzanne Spears has an upcoming appt with the Cardiologist Dr. Rockey Situ. Suzanne Spears thanked me for the printed information that I gave her last time about Heart Failure,. She said it was very helpful and has decreased her stress some just by knowing the information.  30 day review. Continue with ITP unless directed changes per Medical Director review Suzanne Spears called to let us know that she injured her knee over the weekend.  They think its just a sprain on her medial menicus but have asked her wait until she is seen on Thursday before exercising.  She will also be out all of next week on vacation. 30 day review. Continue with ITP unless directed changes per Medical Director review  Last visit 5/31 30 day review. Continue with ITP unless directed changes per Medical Director review  Last visit 5/31      Comments: completed 28/36 sessions due to an injury at home or outside of Cardiac Rehab and she is wearing a boot on her foot.

## 2016-08-13 NOTE — Progress Notes (Signed)
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      Flexibility, Balance, General Exercise Guidelines: - Provides group verbal and written instruction on the benefits of flexibility and balance training programs. Provides general exercise guidelines with specific guidelines to those with heart or lung disease. Demonstration and skill practice provided.   Stress Management: - Provides group verbal and written instruction about the health risks of elevated stress, cause of high stress, and healthy  ways to reduce stress.   Cardiac Rehab from 07/23/2016 in [facility] Cardiac and Pulmonary Rehab  Date  07/23/16  Educator  Lucianne Lei, MSW  Instruction Review Code  2- meets goals/outcomes      Depression: - Provides group verbal and written instruction on the correlation between heart/lung disease and depressed mood, treatment options, and the stigmas associated with seeking treatment.   Cardiac Rehab from 07/23/2016 in [facility] Cardiac and Pulmonary Rehab  Date  06/25/16  Educator  [facility]   Instruction Review Code  2- meets goals/outcomes      Anatomy & Physiology of the Heart: - Group verbal and written instruction and models provide basic cardiac anatomy and physiology, with the coronary electrical and arterial systems. Review of: AMI, Angina, Valve disease, Heart Failure, Cardiac Arrhythmia, Pacemakers, and the ICD.   Cardiac Rehab from 07/23/2016 in [facility] Cardiac and Pulmonary Rehab  Date  05/26/16  Educator  CE  Instruction Review Code  2- meets goals/outcomes      Cardiac Procedures: - Group verbal and written instruction and models to describe the testing methods done to diagnose heart disease. Reviews the outcomes of the test results. Describes the treatment choices: Medical Management, Angioplasty, or Coronary Bypass Surgery.   Cardiac Medications: - Group verbal and written instruction to review commonly prescribed medications for heart disease. Reviews the medication, class of the drug, and side effects. Includes the steps to properly store meds and maintain the prescription regimen.   Cardiac Rehab from 07/23/2016 in [facility] Cardiac and Pulmonary Rehab  Date  06/11/16  Educator  CE  Instruction Review Code  2- meets goals/outcomes      Go Sex-Intimacy & Heart Disease, Get SMART - Goal Setting: - Group verbal and written instruction through game format to discuss heart disease and the return to sexual intimacy. Provides group verbal and written material to discuss and apply goal setting through the application of the S.M.A.R.T. Method.   Other Matters of the Heart: - Provides group verbal, written materials and models to describe Heart Failure, Angina, Valve Disease, and Diabetes in the realm of heart disease. Includes description of the disease process and treatment options available to the cardiac patient.   Cardiac Rehab from 07/23/2016 in [facility] Cardiac and Pulmonary Rehab  Date  07/02/16 [name] was given written information about heart failure]  Educator  [place]   Instruction Review Code  2- meets goals/outcomes      Exercise & Equipment Safety: - Individual verbal instruction and demonstration of equipment use and safety with use of the equipment.   Cardiac Rehab from 07/23/2016 in [facility] Cardiac and Pulmonary Rehab  Date  05/22/16  Educator  SB  Instruction Review Code  2- meets goals/outcomes      Infection Prevention: - Provides verbal and written material to individual with discussion of infection control including proper hand washing and proper equipment cleaning during exercise session.   Cardiac Rehab from 07/23/2016 in [facility] Cardiac and Pulmonary Rehab  Date  05/22/16  Educator  Sb  Instruction Review Code  2- meets goals/outcomes      Falls Prevention: - Provides verbal and written material to individual with discussion of falls prevention and safety.   Cardiac Rehab from 07/23/2016 in [facility] Cardiac and Pulmonary Rehab  Date  05/22/16  Educator  SB  Instruction Review Code  2- meets goals/outcomes      Diabetes: - Individual verbal and written instruction to review signs/symptoms of diabetes, desired ranges of glucose level fasting, after meals  and with exercise. Advice that pre and post exercise glucose checks will be done for 3 sessions at entry of program.    Knowledge Questionnaire Score:     Knowledge Questionnaire Score - 05/22/16 1547      Knowledge Questionnaire Score   Pre Score 20/28  Reviewed correct responses and talked to [facility] about the eduction sessions that will provide the answers as she attend the program.      Core Components/Risk Factors/Patient Goals at Admission:     Personal Goals and Risk Factors at Admission - 05/22/16 1539      Core Components/Risk Factors/Patient Goals on Admission   Improve shortness of breath with ADL's Yes   Intervention Provide education, individualized exercise plan and daily activity instruction to help decrease symptoms of SOB with activities of daily living.    Expected Outcomes Short Term: Achieves a reduction of symptoms when performing activities of daily living.   Heart Failure Yes   Intervention Provide a combined exercise and nutrition program that is supplemented with education, support and counseling about heart failure. Directed toward relieving symptoms such as shortness of breath, decreased exercise tolerance, and extremity edema.   Expected Outcomes Improve functional capacity of life;Short term: Attendance in program 2-3 days a week with increased exercise capacity. Reported lower sodium intake. Reported increased fruit and vegetable intake. Reports medication compliance.;Short term: Daily weights obtained and reported for increase. Utilizing diuretic protocols set by physician.;Long term: Adoption of self-care skills and reduction of barriers for early signs and symptoms recognition and intervention leading to self-care maintenance.   Lipids Yes   Intervention Provide education and support for participant on nutrition & aerobic/resistive exercise along with prescribed medications to achieve LDL <62m, HDL >46m   Expected Outcomes Short Term: Participant states understanding of desired cholesterol values and is compliant with medications prescribed. Participant is following exercise prescription and nutrition guidelines.;Long Term: Cholesterol controlled with medications as prescribed, with individualized exercise RX and with personalized nutrition plan. Value goals: LDL < 7075mHDL > 40 mg.      Core Components/Risk Factors/Patient Goals Review:      Goals and Risk Factor Review    Row Name 05/28/16 1719 05/28/16 1721 07/02/16 1627         Core Components/Risk Factors/Patient Goals Review   Personal Goals Review Heart Failure;Hypertension  - Heart Failure;Hypertension     Review EliHadeneaArbutus Pedeports the MD told her she doesn't have to check her weight since she has the defib in and he follows her weight thru that. KeaArbutus Pedports she  usually doesn't have any swelling but knows if she does or more Sob or weight gain to call MD.  Blood pressure is doing well.  KeaArbutus Pedid she understood better now about her heart failure after I explained it more to her and gave her written information. Keane's(Yina)'s blood pressure has been very good.     Expected Outcomes  - Heart healthy lifestyle Cont to improve her healthy lifestyle steps.         Core Components/Risk Factors/Patient Goals at Discharge (Final Review):      Goals and Risk Factor Review - 07/02/16 1627      Core Components/Risk Factors/Patient Goals Review   Personal Goals Review Heart Failure;Hypertension   Review KeaArbutus Pedid she understood better now about her heart failure after I explained it more to her and gave her written information. Keane's([place])'s blood pressure has been very good.   Expected Outcomes Cont to improve her healthy lifestyle  steps.       ITP Comments:     ITP Comments    Row Name 05/22/16 1529 05/28/16 1718 06/18/16 0620 06/26/16 1701 06/26/16 1707   ITP Comments Medical review completed. Initial ITP created. Documentation of diagnosis can be found in care everywhere Office Visit 04/21/2016 "[name]" reports her Mother in law has gone down hill fast from asst living, nursing home to hospice today with kidney failure.  30 day review. Continue with ITP unless directed changes per Medical Director review.  Recent appointment with ENT for throaat pressure. All normal, cough from Cozaar. Keane walked in and said today she had chest pain moving to right arm like a muscle spasm. During the day [name] reports during the day she had muscle spasms between her shoulder blades.  Faxed information to Dr. Perry Mount reports that her chest pain today moving to her right arm like a muscle spasm has happened several times before.    Row Name 07/02/16 1625 07/16/16 0914 07/28/16 1443 08/13/16 0557     ITP Comments "[name]" was called by Dr. Lupita Dawn office and  we were notified that [name] is suppose to check with her cardiologist about her chest pain. [name] has an upcoming appt with the Cardiologist Dr. Rockey Situ. [name] thanked me for the printed information that I gave her last time about Heart Failure,. She said it was very helpful and has decreased her stress some just by knowing the information.  30 day review. Continue with ITP unless directed changes per Medical Director review [name] called to let us know that she injured her knee over the weekend.  They think its just a sprain on her medial menicus but have asked her wait until she is seen on Thursday before exercising.  She will also be out all of next week on vacation. 30 day review. Continue with ITP unless directed changes per Medical Director review  Last visit 5/31       Comments:

## 2016-08-13 NOTE — Progress Notes (Signed)
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      Flexibility, Balance, General Exercise Guidelines: - Provides group verbal and written instruction on the benefits of flexibility and balance training programs. Provides general exercise guidelines with specific guidelines to those with heart or lung disease. Demonstration and skill practice provided.   Stress Management: - Provides group verbal and written instruction about the health risks of elevated stress, cause of high stress, and healthy  ways to reduce stress.   Cardiac Rehab from 07/23/2016 in [facility] Cardiac and Pulmonary Rehab  Date  07/23/16  Educator  Lucianne Lei, MSW  Instruction Review Code  2- meets goals/outcomes      Depression: - Provides group verbal and written instruction on the correlation between heart/lung disease and depressed mood, treatment options, and the stigmas associated with seeking treatment.   Cardiac Rehab from 07/23/2016 in [facility] Cardiac and Pulmonary Rehab  Date  06/25/16  Educator  [facility]   Instruction Review Code  2- meets goals/outcomes      Anatomy & Physiology of the Heart: - Group verbal and written instruction and models provide basic cardiac anatomy and physiology, with the coronary electrical and arterial systems. Review of: AMI, Angina, Valve disease, Heart Failure, Cardiac Arrhythmia, Pacemakers, and the ICD.   Cardiac Rehab from 07/23/2016 in [facility] Cardiac and Pulmonary Rehab  Date  05/26/16  Educator  CE  Instruction Review Code  2- meets goals/outcomes      Cardiac Procedures: - Group verbal and written instruction and models to describe the testing methods done to diagnose heart disease. Reviews the outcomes of the test results. Describes the treatment choices: Medical Management, Angioplasty, or Coronary Bypass Surgery.   Cardiac Medications: - Group verbal and written instruction to review commonly prescribed medications for heart disease. Reviews the medication, class of the drug, and side effects. Includes the steps to properly store meds and maintain the prescription regimen.   Cardiac Rehab from 07/23/2016 in [facility] Cardiac and Pulmonary Rehab  Date  06/11/16  Educator  CE  Instruction Review Code  2- meets goals/outcomes      Go Sex-Intimacy & Heart Disease, Get SMART - Goal Setting: - Group verbal and written instruction through game format to discuss heart disease and the return to sexual intimacy. Provides group verbal and written material to discuss and apply goal setting through the application of the S.M.A.R.T. Method.   Other Matters of the Heart: - Provides group verbal, written materials and models to describe Heart Failure, Angina, Valve Disease, and Diabetes in the realm of heart disease. Includes description of the disease process and treatment options available to the cardiac patient.   Cardiac Rehab from 07/23/2016 in [facility] Cardiac and Pulmonary Rehab  Date  07/02/16 [name] was given written information about heart failure]  Educator  [place]   Instruction Review Code  2- meets goals/outcomes      Exercise & Equipment Safety: - Individual verbal instruction and demonstration of equipment use and safety with use of the equipment.   Cardiac Rehab from 07/23/2016 in [facility] Cardiac and Pulmonary Rehab  Date  05/22/16  Educator  SB  Instruction Review Code  2- meets goals/outcomes      Infection Prevention: - Provides verbal and written material to individual with discussion of infection control including proper hand washing and proper equipment cleaning during exercise session.   Cardiac Rehab from 07/23/2016 in [facility] Cardiac and Pulmonary Rehab  Date  05/22/16  Educator  Sb  Instruction Review Code  2- meets goals/outcomes      Falls Prevention: - Provides verbal and written material to individual with discussion of falls prevention and safety.   Cardiac Rehab from 07/23/2016 in [facility] Cardiac and Pulmonary Rehab  Date  05/22/16  Educator  SB  Instruction Review Code  2- meets goals/outcomes      Diabetes: - Individual verbal and written instruction to review signs/symptoms of diabetes, desired ranges of glucose level fasting, after meals  and with exercise. Advice that pre and post exercise glucose checks will be done for 3 sessions at entry of program.    Knowledge Questionnaire Score:     Knowledge Questionnaire Score - 05/22/16 1547      Knowledge Questionnaire Score   Pre Score 20/28  Reviewed correct responses and talked to [facility] about the eduction sessions that will provide the answers as she attend the program.      Core Components/Risk Factors/Patient Goals at Admission:     Personal Goals and Risk Factors at Admission - 05/22/16 1539      Core Components/Risk Factors/Patient Goals on Admission   Improve shortness of breath with ADL's Yes   Intervention Provide education, individualized exercise plan and daily activity instruction to help decrease symptoms of SOB with activities of daily living.    Expected Outcomes Short Term: Achieves a reduction of symptoms when performing activities of daily living.   Heart Failure Yes   Intervention Provide a combined exercise and nutrition program that is supplemented with education, support and counseling about heart failure. Directed toward relieving symptoms such as shortness of breath, decreased exercise tolerance, and extremity edema.   Expected Outcomes Improve functional capacity of life;Short term: Attendance in program 2-3 days a week with increased exercise capacity. Reported lower sodium intake. Reported increased fruit and vegetable intake. Reports medication compliance.;Short term: Daily weights obtained and reported for increase. Utilizing diuretic protocols set by physician.;Long term: Adoption of self-care skills and reduction of barriers for early signs and symptoms recognition and intervention leading to self-care maintenance.   Lipids Yes   Intervention Provide education and support for participant on nutrition & aerobic/resistive exercise along with prescribed medications to achieve LDL <62m, HDL >46m   Expected Outcomes Short Term: Participant states understanding of desired cholesterol values and is compliant with medications prescribed. Participant is following exercise prescription and nutrition guidelines.;Long Term: Cholesterol controlled with medications as prescribed, with individualized exercise RX and with personalized nutrition plan. Value goals: LDL < 7075mHDL > 40 mg.      Core Components/Risk Factors/Patient Goals Review:      Goals and Risk Factor Review    Row Name 05/28/16 1719 05/28/16 1721 07/02/16 1627         Core Components/Risk Factors/Patient Goals Review   Personal Goals Review Heart Failure;Hypertension  - Heart Failure;Hypertension     Review EliHadeneaArbutus Pedeports the MD told her she doesn't have to check her weight since she has the defib in and he follows her weight thru that. KeaArbutus Pedports she  usually doesn't have any swelling but knows if she does or more Sob or weight gain to call MD.  Blood pressure is doing well.  KeaArbutus Pedid she understood better now about her heart failure after I explained it more to her and gave her written information. Keane's(Eilene)'s blood pressure has been very good.     Expected Outcomes  - Heart healthy lifestyle Cont to improve her healthy lifestyle steps.         Core Components/Risk Factors/Patient Goals at Discharge (Final Review):      Goals and Risk Factor Review - 07/02/16 1627      Core Components/Risk Factors/Patient Goals Review   Personal Goals Review Heart Failure;Hypertension   Review KeaArbutus Pedid she understood better now about her heart failure after I explained it more to her and gave her written information. Keane's(Nedda)'s blood pressure has been very good.   Expected Outcomes Cont to improve her healthy lifestyle  steps.       ITP Comments:     ITP Comments    Row Name 05/22/16 1529 05/28/16 1718 06/18/16 0620 06/26/16 1701 06/26/16 1707   ITP Comments Medical review completed. Initial ITP created. Documentation of diagnosis can be found in care everywhere Office Visit 04/21/2016 "[name]" reports her Mother in law has gone down hill fast from asst living, nursing home to hospice today with kidney failure.  30 day review. Continue with ITP unless directed changes per Medical Director review.  Recent appointment with ENT for throaat pressure. All normal, cough from Cozaar. Keane walked in and said today she had chest pain moving to right arm like a muscle spasm. During the day [name] reports during the day she had muscle spasms between her shoulder blades.  Faxed information to Dr. Perry Mount reports that her chest pain today moving to her right arm like a muscle spasm has happened several times before.    Row Name 07/02/16 1625 07/16/16 0914 07/28/16 1443 08/13/16 0557 08/13/16 0558   ITP Comments "[name]" was called by Dr. Lupita Dawn  office and we were notified that [name] is suppose to check with her cardiologist about her chest pain. [name] has an upcoming appt with the Cardiologist Dr. Rockey Situ. [name] thanked me for the printed information that I gave her last time about Heart Failure,. She said it was very helpful and has decreased her stress some just by knowing the information.  30 day review. Continue with ITP unless directed changes per Medical Director review [name] called to let us know that she injured her knee over the weekend.  They think its just a sprain on her medial menicus but have asked her wait until she is seen on Thursday before exercising.  She will also be out all of next week on vacation. 30 day review. Continue with ITP unless directed changes per Medical Director review  Last visit 5/31 30 day review. Continue with ITP unless directed changes per Medical Director review  Last visit 5/31      Comments:

## 2016-08-13 NOTE — Progress Notes (Signed)
Discharge Summary  Patient Details  Name: Suzanne Spears MRN: [id number] Date of Birth: 03-Feb-1961 Referring Provider:     Cardiac Rehab from 05/22/2016 in [facility] Cardiac and Pulmonary Rehab  Referring Provider  Suzanne Royals MD       Number of Visits: 28/36   Reason for Discharge:  Early Exit:  Personal foot in a boot due to injury at home or away from Cardiac Rehab.   Smoking History:  History  Smoking Status  . Never Smoker  Smokeless Tobacco  . Never Used    Diagnosis:  Heart failure, chronic systolic (HCC)  ADL UCSD:   Initial Exercise Prescription:     Initial Exercise Prescription - 05/22/16 1600      Date of Initial Exercise RX and Referring Provider   Date 05/22/16   Referring Provider Suzanne Royals MD     Treadmill   MPH 2.5   Grade 0   Minutes 15   METs 2.91     Recumbant Bike   Level 1   Minutes 15   METs 2.5     NuStep   Level 3   SPM 80   Minutes 15   METs 2.5     Recumbant Elliptical   Level 1   RPM 50   Minutes 15   METs 2     REL-XR   Level 1   Speed 50   Minutes 15   METs 2.5     T5 Nustep   Level 2   SPM 80   Minutes 15   METs 2     Biostep-RELP   Level 2   SPM 50   Minutes 15   METs 2     Prescription Details   Frequency (times per week) 3   Duration Progress to 45 minutes of aerobic exercise without signs/symptoms of physical distress     Intensity   THRR 40-80% of Max Heartrate 121-151   Ratings of Perceived Exertion 11-13   Perceived Dyspnea 0-4     Resistance Training   Training Prescription Yes   Weight 2      Discharge Exercise Prescription (Final Exercise Prescription Changes):     Exercise Prescription Changes - 07/24/16 1000      Response to Exercise   Blood Pressure (Admit) 108/66   Blood Pressure (Exercise) 130/84   Blood Pressure (Exit) 122/78   Heart Rate (Admit) 85 bpm   Heart Rate (Exercise) 122 bpm   Heart Rate (Exit) 96 bpm   Rating of Perceived Exertion (Exercise) 15   Duration Continue with 45 min of aerobic exercise without signs/symptoms of physical distress.   Intensity THRR unchanged     Progression   Progression Continue to progress workloads to maintain intensity without signs/symptoms of physical distress.     Resistance Training   Training Prescription Yes   Weight 2   Reps 10-15     Treadmill   MPH 2.8   Grade 1   Minutes 15   METs 3.53     Recumbant Bike   Level 2   Minutes 15   METs 2.98      Functional Capacity:     6 Minute Walk    Row Name 05/22/16 1558         6 Minute Walk   Distance 1325 feet     Walk Time 6 minutes     # of Rest Breaks 0     MPH 2.5     METS 3.8  RPE 13     VO2 Peak 13.32     Symptoms Yes (comment)     Comments chest pain - chronic 7/10 - makes her cough(same as when walking in parking lot at work)     Resting HR 92 bpm     Resting BP 126/60     Max Ex. HR 109 bpm     Max Ex. BP 128/76     2 Minute Post BP 132/62        Psychological, QOL, Others - Outcomes: PHQ 2/9: Depression screen PHQ 2/9 05/22/2016  Decreased Interest 0  Down, Depressed, Hopeless 0  PHQ - 2 Score 0  Altered sleeping 2  Tired, decreased energy 3  Change in appetite 2  Feeling bad or failure about yourself  0  Trouble concentrating 0  Moving slowly or fidgety/restless 0  Suicidal thoughts 0  PHQ-9 Score 7  Difficult doing work/chores Somewhat difficult  Some recent data might be hidden    Quality of Life:     Quality of Life - 05/22/16 1542      Quality of Life Scores   Health/Function Pre 20.92 %   Socioeconomic Pre 21 %   Psych/Spiritual Pre 21 %   Family Pre 21 %   GLOBAL Pre 20.96 %      Personal Goals: Goals established at orientation with interventions provided to work toward goal.     Personal Goals and Risk Factors at Admission - 05/22/16 1539      Core Components/Risk Factors/Patient Goals on Admission   Improve shortness of breath with ADL's Yes   Intervention Provide  education, individualized exercise plan and daily activity instruction to help decrease symptoms of SOB with activities of daily living.   Expected Outcomes Short Term: Achieves a reduction of symptoms when performing activities of daily living.   Heart Failure Yes   Intervention Provide a combined exercise and nutrition program that is supplemented with education, support and counseling about heart failure. Directed toward relieving symptoms such as shortness of breath, decreased exercise tolerance, and extremity edema.   Expected Outcomes Improve functional capacity of life;Short term: Attendance in program 2-3 days a week with increased exercise capacity. Reported lower sodium intake. Reported increased fruit and vegetable intake. Reports medication compliance.;Short term: Daily weights obtained and reported for increase. Utilizing diuretic protocols set by physician.;Long term: Adoption of self-care skills and reduction of barriers for early signs and symptoms recognition and intervention leading to self-care maintenance.   Lipids Yes   Intervention Provide education and support for participant on nutrition & aerobic/resistive exercise along with prescribed medications to achieve LDL 70mg , HDL >40mg .   Expected Outcomes Short Term: Participant states understanding of desired cholesterol values and is compliant with medications prescribed. Participant is following exercise prescription and nutrition guidelines.;Long Term: Cholesterol controlled with medications as prescribed, with individualized exercise RX and with personalized nutrition plan. Value goals: LDL < 70mg , HDL > 40 mg.       Personal Goals Discharge:     Goals and Risk Factor Review    Row Name 05/28/16 1719 05/28/16 1721 07/02/16 1627         Core Components/Risk Factors/Patient Goals Review   Personal Goals Review Heart Failure;Hypertension  - Heart Failure;Hypertension     Review Suzanne "Suzanne Spears" reports the MD told her she  doesn't have to check her weight since she has the defib in and he follows her weight thru that. Suzanne Spears reports she usually doesn't have any swelling but  knows if she does or more Sob or weight gain to call MD.  Blood pressure is doing well.  Suzanne Spears said she understood better now about her heart failure after I explained it more to her and gave her written information. Suzanne Spears blood pressure has been very good.     Expected Outcomes  - Heart healthy lifestyle Cont to improve her healthy lifestyle steps.         Nutrition & Weight - Outcomes:     Pre Biometrics - 05/26/16 1631      Pre Biometrics   Single Leg Stand 20 seconds       Nutrition:     Nutrition Therapy & Goals - 05/22/16 1539      Intervention Plan   Intervention Prescribe, educate and counsel regarding individualized specific dietary modifications aiming towards targeted core components such as weight, hypertension, lipid management, diabetes, heart failure and other comorbidities.   Expected Outcomes Short Term Goal: Understand basic principles of dietary content, such as calories, fat, sodium, cholesterol and nutrients.;Short Term Goal: A plan has been developed with personal nutrition goals set during dietitian appointment.;Long Term Goal: Adherence to prescribed nutrition plan.      Nutrition Discharge:     Nutrition Assessments - 05/22/16 1539      MEDFICTS Scores   Pre Score 67      Education Questionnaire Score:     Knowledge Questionnaire Score - 05/22/16 1547      Knowledge Questionnaire Score   Pre Score 20/28  Reviewed correct responses and talked to Suzanne Spears about the eduction sessions that will provide the answers as she attend the program.      Goals reviewed with patient; copy given to patient. Early exit due to foot in a boot injury outside of Cardiac Rehab.

## 2016-08-14 DIAGNOSIS — I5022 Chronic systolic (congestive) heart failure: Secondary | ICD-10-CM

## 2016-08-14 NOTE — Progress Notes (Signed)
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      Flexibility, Balance, General Exercise Guidelines: - Provides group verbal and written instruction on the benefits of flexibility and balance training programs. Provides general exercise guidelines with specific guidelines to those with heart or lung disease. Demonstration and skill practice provided.   Stress Management: - Provides group verbal and written instruction about the health risks of elevated  stress, cause of high stress, and healthy ways to reduce stress.   Cardiac Rehab from 07/23/2016 in [facility] Cardiac and Pulmonary Rehab  Date  07/23/16  Educator  Lucianne Lei, MSW  Instruction Review Code  2- meets goals/outcomes      Depression: - Provides group verbal and written instruction on the correlation between heart/lung disease and depressed mood, treatment options, and the stigmas associated with seeking treatment.   Cardiac Rehab from 07/23/2016 in [facility] Cardiac and Pulmonary Rehab  Date  06/25/16  Educator  [facility]   Instruction Review Code  2- meets goals/outcomes      Anatomy & Physiology of the Heart: - Group verbal and written instruction and models provide basic cardiac anatomy and physiology, with the coronary electrical and arterial systems. Review of: AMI, Angina, Valve disease, Heart Failure, Cardiac Arrhythmia, Pacemakers, and the ICD.   Cardiac Rehab from 07/23/2016 in [facility] Cardiac and Pulmonary Rehab  Date  05/26/16  Educator  CE  Instruction Review Code  2- meets goals/outcomes      Cardiac Procedures: - Group verbal and written instruction and models to describe the testing methods done to diagnose heart disease. Reviews the outcomes of the test results. Describes the treatment choices: Medical Management, Angioplasty, or Coronary Bypass Surgery.   Cardiac Medications: - Group verbal and written instruction to review commonly prescribed medications for heart disease. Reviews the medication, class of the drug, and side effects. Includes the steps to properly store meds and maintain the prescription regimen.   Cardiac Rehab from 07/23/2016 in [facility] Cardiac and Pulmonary Rehab  Date  06/11/16  Educator  CE  Instruction Review Code  2- meets goals/outcomes      Go Sex-Intimacy & Heart Disease, Get SMART - Goal Setting: - Group verbal and written instruction through game format to discuss heart disease and the return to sexual intimacy. Provides group verbal and written material to discuss and apply goal setting through the application of the S.M.A.R.T. Method.   Other Matters of the Heart: - Provides group verbal, written materials and models to describe Heart Failure, Angina, Valve Disease, and Diabetes in the realm of heart disease. Includes description of the disease process and treatment options available to the cardiac patient.   Cardiac Rehab from 07/23/2016 in [facility] Cardiac and Pulmonary Rehab  Date  07/02/16 [name] was given written information about heart failure]  Educator  [place]   Instruction Review Code  2- meets goals/outcomes      Exercise & Equipment Safety: - Individual verbal instruction and demonstration of equipment use and safety with use of the equipment.   Cardiac Rehab from 07/23/2016 in [facility] Cardiac and Pulmonary Rehab  Date  05/22/16  Educator  SB  Instruction Review Code  2- meets goals/outcomes      Infection Prevention: - Provides verbal and written material to individual with discussion of infection control including proper hand washing and proper equipment cleaning during exercise session.   Cardiac Rehab from 07/23/2016 in [facility] Cardiac and Pulmonary Rehab  Date  05/22/16  Educator  Sb  Instruction Review Code  2- meets goals/outcomes      Falls Prevention: - Provides verbal and written material to individual with discussion of falls prevention and safety.   Cardiac Rehab from 07/23/2016 in [facility] Cardiac and Pulmonary Rehab  Date  05/22/16  Educator  SB  Instruction Review Code  2- meets goals/outcomes      Diabetes: - Individual verbal and written instruction to review signs/symptoms of diabetes, desired  ranges of glucose level fasting, after meals and with exercise. Advice that pre and post exercise glucose checks will be done for 3 sessions at entry of program.    Knowledge Questionnaire Score:     Knowledge Questionnaire Score - 05/22/16 1547      Knowledge Questionnaire Score   Pre Score 20/28  Reviewed correct responses and talked to [facility] about the eduction sessions that will provide the answers as she attend the program.      Core Components/Risk Factors/Patient Goals at Admission:     Personal Goals and Risk Factors at Admission - 05/22/16 1539      Core Components/Risk Factors/Patient Goals on Admission   Improve shortness of breath with ADL's Yes   Intervention Provide education, individualized exercise plan and daily activity instruction to help decrease symptoms of SOB with activities of daily living.    Expected Outcomes Short Term: Achieves a reduction of symptoms when performing activities of daily living.   Heart Failure Yes   Intervention Provide a combined exercise and nutrition program that is supplemented with education, support and counseling about heart failure. Directed toward relieving symptoms such as shortness of breath, decreased exercise tolerance, and extremity edema.   Expected Outcomes Improve functional capacity of life;Short term: Attendance in program 2-3 days a week with increased exercise capacity. Reported lower sodium intake. Reported increased fruit and vegetable intake. Reports medication compliance.;Short term: Daily weights obtained and reported for increase. Utilizing diuretic protocols set by physician.;Long term: Adoption of self-care skills and reduction of barriers for early signs and symptoms recognition and intervention leading to self-care maintenance.   Lipids Yes   Intervention Provide education and support for participant on nutrition & aerobic/resistive exercise along with prescribed medications to achieve LDL <90m, HDL >436m   Expected Outcomes Short Term: Participant states understanding of desired cholesterol values and is compliant with medications prescribed. Participant is following exercise prescription and nutrition guidelines.;Long Term: Cholesterol controlled with medications as prescribed, with individualized exercise RX and with personalized nutrition plan. Value goals: LDL < 707mHDL > 40 mg.      Core Components/Risk Factors/Patient Goals Review:      Goals and Risk Factor Review    Row Name 05/28/16 1719 05/28/16 1721 07/02/16 1627         Core Components/Risk Factors/Patient Goals Review   Personal Goals Review Heart Failure;Hypertension  - Heart Failure;Hypertension     Review EliAudriellaeaArbutus Pedeports the MD told her she doesn't have to check her weight since she has the defib in and he follows her weight thru that. KeaArbutus Pedports she  usually doesn't have any swelling but knows if she does or more Sob or weight gain to call MD.  Blood pressure is doing well.  KeaArbutus Pedid she understood better now about her heart failure after I explained it more to her and gave her written information. Keane's(Mauriah)'s blood pressure has been very good.     Expected Outcomes  - Heart healthy lifestyle Cont to improve her healthy lifestyle steps.         Core Components/Risk Factors/Patient Goals at Discharge (Final Review):      Goals and Risk Factor Review - 07/02/16 1627      Core Components/Risk Factors/Patient Goals Review   Personal Goals Review Heart Failure;Hypertension   Review KeaArbutus Pedid she understood better now about her heart failure after I explained it more to her and gave her written information. Keane's(Bristyl)'s blood pressure has been very good.   Expected  Outcomes Cont to improve her healthy lifestyle steps.       ITP Comments:     ITP Comments    Row Name 05/22/16 1529 05/28/16 1718 06/18/16 0620 06/26/16 1701 06/26/16 1707   ITP Comments Medical review completed. Initial ITP created. Documentation of diagnosis can be found in care everywhere Office Visit 04/21/2016 "[name]" reports her Mother in law has gone down hill fast from asst living, nursing home to hospice today with kidney failure.  30 day review. Continue with ITP unless directed changes per Medical Director review.  Recent appointment with ENT for throaat pressure. All normal, cough from Cozaar. Keane walked in and said today she had chest pain moving to right arm like a muscle spasm. During the day [name] reports during the day she had muscle spasms between her shoulder blades.  Faxed information to Dr. Perry Mount reports that her chest pain today moving to her right arm like a muscle spasm has happened several times before.    Row Name 07/02/16 1625 07/16/16 0914 07/28/16 1443 08/13/16 0557 08/13/16 0558   ITP Comments "[name]" was called by Dr. Lupita Dawn  office and we were notified that [name] is suppose to check with her cardiologist about her chest pain. [name] has an upcoming appt with the Cardiologist Dr. Rockey Situ. [name] thanked me for the printed information that I gave her last time about Heart Failure,. She said it was very helpful and has decreased her stress some just by knowing the information.  30 day review. Continue with ITP unless directed changes per Medical Director review [name] called to let us know that she injured her knee over the weekend.  They think its just a sprain on her medial menicus but have asked her wait until she is seen on Thursday before exercising.  She will also be out all of next week on vacation. 30 day review. Continue with ITP unless directed changes per Medical Director review  Last visit 5/31 30 day review. Continue with ITP unless directed changes per Medical Director review  Last visit 5/31   Row Name 08/14/16 1019           ITP Comments [name] was not able to finish the program due to a orthopedic injury that affected her exercise.          Comments: Discharge ITP

## 2016-08-14 NOTE — Progress Notes (Signed)
Discharge Summary  Patient Details  Name: Suzanne Spears MRN: [id number] Date of Birth: [birth year] Referring Provider:     Cardiac Rehab from 05/22/2016 in [facility] Cardiac and Pulmonary Rehab  Referring Provider  Suzanne Royals MD       Number of Visits: 28  Reason for Discharge:  Early Exit:  Personal  Smoking History:  History  Smoking Status  . Never Smoker  Smokeless Tobacco  . Never Used    Diagnosis:  Heart failure, chronic systolic (HCC)  ADL UCSD:   Initial Exercise Prescription:     Initial Exercise Prescription - 05/22/16 1600      Date of Initial Exercise RX and Referring Provider   Date 05/22/16   Referring Provider Suzanne Royals MD     Treadmill   MPH 2.5   Grade 0   Minutes 15   METs 2.91     Recumbant Bike   Level 1   Minutes 15   METs 2.5     NuStep   Level 3   SPM 80   Minutes 15   METs 2.5     Recumbant Elliptical   Level 1   RPM 50   Minutes 15   METs 2     REL-XR   Level 1   Speed 50   Minutes 15   METs 2.5     T5 Nustep   Level 2   SPM 80   Minutes 15   METs 2     Biostep-RELP   Level 2   SPM 50   Minutes 15   METs 2     Prescription Details   Frequency (times per week) 3   Duration Progress to 45 minutes of aerobic exercise without signs/symptoms of physical distress     Intensity   THRR 40-80% of Max Heartrate 121-151   Ratings of Perceived Exertion 11-13   Perceived Dyspnea 0-4     Resistance Training   Training Prescription Yes   Weight 2      Discharge Exercise Prescription (Final Exercise Prescription Changes):     Exercise Prescription Changes - 07/24/16 1000      Response to Exercise   Blood Pressure (Admit) 108/66   Blood Pressure (Exercise) 130/84   Blood Pressure (Exit) 122/78   Heart Rate (Admit) 85 bpm   Heart Rate (Exercise) 122 bpm   Heart Rate (Exit) 96 bpm   Rating of Perceived Exertion (Exercise) 15   Duration Continue with 45 min of aerobic exercise without  signs/symptoms of physical distress.   Intensity THRR unchanged     Progression   Progression Continue to progress workloads to maintain intensity without signs/symptoms of physical distress.     Resistance Training   Training Prescription Yes   Weight 2   Reps 10-15     Treadmill   MPH 2.8   Grade 1   Minutes 15   METs 3.53     Recumbant Bike   Level 2   Minutes 15   METs 2.98      Functional Capacity:     6 Minute Walk    Row Name 05/22/16 1558         6 Minute Walk   Distance 1325 feet     Walk Time 6 minutes     # of Rest Breaks 0     MPH 2.5     METS 3.8     RPE 13     VO2 Peak 13.[place]  Symptoms Yes (comment)     Comments chest pain - chronic 7/10 - makes her cough(same as when walking in parking lot at work)     Resting HR 92 bpm     Resting BP 126/60     Max Ex. HR 109 bpm     Max Ex. BP 128/76     2 Minute Post BP 132/62        Psychological, QOL, Others - Outcomes: PHQ 2/9: Depression screen PHQ 2/9 05/22/2016  Decreased Interest 0  Down, Depressed, Hopeless 0  PHQ - 2 Score 0  Altered sleeping 2  Tired, decreased energy 3  Change in appetite 2  Feeling bad or failure about yourself  0  Trouble concentrating 0  Moving slowly or fidgety/restless 0  Suicidal thoughts 0  PHQ-9 Score 7  Difficult doing work/chores Somewhat difficult  Some recent data might be hidden    Quality of Life:     Quality of Life - 05/22/16 1542      Quality of Life Scores   Health/Function Pre 20.92 %   Socioeconomic Pre 21 %   Psych/Spiritual Pre 21 %   Family Pre 21 %   GLOBAL Pre 20.96 %      Personal Goals: Goals established at orientation with interventions provided to work toward goal.     Personal Goals and Risk Factors at Admission - 05/22/16 1539      Core Components/Risk Factors/Patient Goals on Admission   Improve shortness of breath with ADL's Yes   Intervention Provide education, individualized exercise plan and daily activity  instruction to help decrease symptoms of SOB with activities of daily living.   Expected Outcomes Short Term: Achieves a reduction of symptoms when performing activities of daily living.   Heart Failure Yes   Intervention Provide a combined exercise and nutrition program that is supplemented with education, support and counseling about heart failure. Directed toward relieving symptoms such as shortness of breath, decreased exercise tolerance, and extremity edema.   Expected Outcomes Improve functional capacity of life;Short term: Attendance in program 2-3 days a week with increased exercise capacity. Reported lower sodium intake. Reported increased fruit and vegetable intake. Reports medication compliance.;Short term: Daily weights obtained and reported for increase. Utilizing diuretic protocols set by physician.;Long term: Adoption of self-care skills and reduction of barriers for early signs and symptoms recognition and intervention leading to self-care maintenance.   Lipids Yes   Intervention Provide education and support for participant on nutrition & aerobic/resistive exercise along with prescribed medications to achieve LDL 70mg , HDL >40mg .   Expected Outcomes Short Term: Participant states understanding of desired cholesterol values and is compliant with medications prescribed. Participant is following exercise prescription and nutrition guidelines.;Long Term: Cholesterol controlled with medications as prescribed, with individualized exercise RX and with personalized nutrition plan. Value goals: LDL < 70mg , HDL > 40 mg.       Personal Goals Discharge:     Goals and Risk Factor Review    Row Name 05/28/16 1719 05/28/16 1721 07/02/16 1627         Core Components/Risk Factors/Patient Goals Review   Personal Goals Review Heart Failure;Hypertension  - Heart Failure;Hypertension     Review Suzanne "[name]" reports the MD told her she doesn't have to check her weight since she has the defib in  and he follows her weight thru that. [name] reports she usually doesn't have any swelling but knows if she does or more Sob or weight gain to call MD.  Blood pressure is doing well.  [name] said she understood better now about her heart failure after I explained it more to her and gave her written information. Keane's(Dyanna)'s blood pressure has been very good.     Expected Outcomes  - Heart healthy lifestyle Cont to improve her healthy lifestyle steps.         Nutrition & Weight - Outcomes:     Pre Biometrics - 05/26/16 1631      Pre Biometrics   Single Leg Stand 20 seconds       Nutrition:     Nutrition Therapy & Goals - 05/22/16 1539      Intervention Plan   Intervention Prescribe, educate and counsel regarding individualized specific dietary modifications aiming towards targeted core components such as weight, hypertension, lipid management, diabetes, heart failure and other comorbidities.   Expected Outcomes Short Term Goal: Understand basic principles of dietary content, such as calories, fat, sodium, cholesterol and nutrients.;Short Term Goal: A plan has been developed with personal nutrition goals set during dietitian appointment.;Long Term Goal: Adherence to prescribed nutrition plan.      Nutrition Discharge:     Nutrition Assessments - 05/22/16 1539      MEDFICTS Scores   Pre Score 67      Education Questionnaire Score:     Knowledge Questionnaire Score - 05/22/16 1547      Knowledge Questionnaire Score   Pre Score 20/28  Reviewed correct responses and talked to [name] about the eduction sessions that will provide the answers as she attend the program.      Goals reviewed with patient; copy given to patient.

## 2016-08-17 LAB — CUP PACEART INCLINIC DEVICE CHECK
Battery Remaining Longevity: 50 mo
Battery Voltage: 2.92 V
Brady Statistic AP VP Percent: 0.03 %
Brady Statistic AP VS Percent: 0.01 %
Brady Statistic AS VP Percent: 98.66 %
Brady Statistic AS VS Percent: 1.3 %
Brady Statistic RA Percent Paced: 0.04 %
Brady Statistic RV Percent Paced: 4.99 %
Date Time Interrogation Session: 20180605144813
HighPow Impedance: 92 Ohm
Implantable Lead Implant Date: 20160421
Implantable Lead Implant Date: 20160421
Implantable Lead Implant Date: 20161006
Implantable Lead Implant Date: 20161006
Implantable Lead Location: 753858
Implantable Lead Location: 753858
Implantable Lead Location: 753859
Implantable Lead Location: 753860
Implantable Lead Model: 5071
Implantable Lead Model: 5071
Implantable Lead Model: 5076
Implantable Pulse Generator Implant Date: 20160421
Lead Channel Impedance Value: 342 Ohm
Lead Channel Impedance Value: 342 Ohm
Lead Channel Impedance Value: 4047 Ohm
Lead Channel Impedance Value: 4047 Ohm
Lead Channel Impedance Value: 437 Ohm
Lead Channel Impedance Value: 456 Ohm
Lead Channel Pacing Threshold Amplitude: 0.75 V
Lead Channel Pacing Threshold Amplitude: 0.75 V
Lead Channel Pacing Threshold Amplitude: 1.25 V
Lead Channel Pacing Threshold Pulse Width: 0.4 ms
Lead Channel Pacing Threshold Pulse Width: 0.4 ms
Lead Channel Pacing Threshold Pulse Width: 0.8 ms
Lead Channel Sensing Intrinsic Amplitude: 2.5 mV
Lead Channel Sensing Intrinsic Amplitude: 31.625 mV
Lead Channel Setting Pacing Amplitude: 1.5 V
Lead Channel Setting Pacing Amplitude: 2 V
Lead Channel Setting Pacing Amplitude: 2.5 V
Lead Channel Setting Pacing Pulse Width: 0.4 ms
Lead Channel Setting Pacing Pulse Width: 0.8 ms
Lead Channel Setting Sensing Sensitivity: 0.3 mV

## 2016-08-24 DIAGNOSIS — I5032 Chronic diastolic (congestive) heart failure: Secondary | ICD-10-CM | POA: Insufficient documentation

## 2016-08-24 NOTE — Progress Notes (Signed)
Cardiology Office Note  Date:  08/26/2016   ID:  Suzanne Spears, DOB 09-27-1960, MRN [id number]  PCP:  [name], MD   Chief Complaint  Patient presents with  . OTHER    C/o chest pain/back spasms and right arm pain. Meds reviewed verbally with pt.    HPI:  Suzanne Spears is a 56 y.o. female with PMH of nonischemic cardiomyopathyEF 25% in 2012/2016, up to 55% 08/2015 Left bundle branch block with a QRS duration 125-130 milliseconds  CRT-D implanted by Dr. Lovena Le 4/16  suffered lead dislodgment and underwent epicardial lead placement.  postoperative course was complicated by pulmonary embolism.  BB intolerant fatigue and dyspnea,  12/17  CPX-- submaximal effort but elevated VE/VCO2 slope suggested " least mild to moderate circulatory limitation"   Cardiac rehab recommended she has started to Exercise but she hurt herself running, knee and ankle  In follow-up today me she reports having some right scapula pain for the past few months Symptoms presenting At rest, while sleeping, sitting She did feel some symptoms while at cardiac rehabilitation Feels that her back is going into spasm She stopped cardiac rehabilitation also because she has significant left knee pain  Long history of Chronic neck pain, s/p surgery, followed by Dr. Arnoldo Morale  Chest CT scan from 2016 reviewed with her in detail, images pulled up in the office No significant carotid, aortic atherosclerosis, no coronary calcifications  Problems with statins  in the past She is taking Lasix daily, not taking potassium Previous potassium before she was taking Lasix 3.4  EKG personally reviewed by myself on todays visit Shows atrial sensed ventricularly paced rhythm rate 75 bpm   PMH:   has a past medical history of AICD (automatic cardioverter/defibrillator) present; Allergy; Anemia; Asthma; Cardiomyopathy, dilated, nonischemic ([place]); Chronic systolic (congestive) heart failure ([place]); Cough; Depression;  Dizziness; GERD (gastroesophageal reflux disease); Headache(784.0); Heart murmur; Hip pain, right (200); History of bronchitis; History of shingles; Hyperlipidemia; Hypertension; Left bundle branch block (2008); Mitral valve prolapse syndrome; Pericarditis (2008); Peripheral neuropathy; Pneumonia (2016); Presence of permanent cardiac pacemaker; Shortness of breath dyspnea; and Spinal headache.  PSH:    Past Surgical History:  Procedure Laterality Date  . ANTERIOR CERVICAL DECOMP/DISCECTOMY FUSION N/A 10/21/2012   Procedure: ANTERIOR CERVICAL DECOMPRESSION/DISCECTOMY FUSION 2 LEVELS;  Surgeon: Ophelia Charter, MD;  Location: [facility];  Service: Neurosurgery;  Laterality: N/A;  C56 C67 anterior cervical decompression with fusion interbody prothesis plating and bonegraft  . APPENDECTOMY  2009   for appendicitis, , [place]  . BI-VENTRICULAR IMPLANTABLE CARDIOVERTER DEFIBRILLATOR N/A 06/15/2014   MDT CRTD implanted by Dr Lovena Le  . blood clot removed from left top hand    . CARDIAC CATHETERIZATION  01/13/05/2015   normal coronaries, EF 50%  . CARDIAC CATHETERIZATION    . CESAREAN SECTION     x 2  . COLONOSCOPY     Hx: of  . EPICARDIAL PACING LEAD PLACEMENT N/A 11/30/2014   Procedure: EPICARDIAL PACING LEAD PLACEMENT;  Surgeon: Gaye Pollack, MD;  Location: [facility];  Service: Thoracic;  Laterality: N/A;  . ganglionic cyst  remote   right wrist  . tendon release surgery Left   . THORACOTOMY Left 11/30/2014   Procedure: THORACOTOMY MAJOR;  Surgeon: Gaye Pollack, MD;  Location: [facility];  Service: Thoracic;  Laterality: Left;  . TONSILLECTOMY    . turbinectomy  2009   [place]    Current Outpatient Prescriptions  Medication Sig Dispense Refill  . albuterol (PROVENTIL  HFA;VENTOLIN HFA) 108 (90 BASE) MCG/ACT inhaler Inhale 2 puffs into the lungs daily as needed for wheezing or shortness of breath.    Marland Kitchen albuterol (PROVENTIL) (2.5 MG/3ML) 0.083% nebulizer solution Take 3 mLs (2.5 mg total) by  nebulization every 6 (six) hours as needed for wheezing or shortness of breath. 150 mL 1  . ARNUITY ELLIPTA 100 MCG/ACT AEPB INHALE 1 PUFF BY MOUTH INTO THE LUNGS DAILY AS DIRECTED BY PHYSICIAN. 30 each 4  . Azelastine-Fluticasone 137-50 MCG/ACT SUSP Place 1 spray into the nose 2 (two) times daily. 23 g 5  . baclofen (LIORESAL) 10 MG tablet Take 10 mg by mouth 2 (two) times daily.   0  . cetirizine (ZYRTEC) 10 MG chewable tablet Chew 10 mg by mouth daily.    . citalopram (CELEXA) 20 MG tablet Take 1 tablet (20 mg total) by mouth daily. 30 tablet 2  . cyanocobalamin (,VITAMIN B-12,) 1000 MCG/ML injection INJECT 1 ML INTO THE MUSCLE ONCE A WEEK 4 mL 3  . DULoxetine (CYMBALTA) 30 MG capsule Take 1 capsule (30 mg total) by mouth daily. 30 capsule 3  . etodolac (LODINE) 500 MG tablet Take 1 tablet (500 mg total) by mouth 2 (two) times daily. 60 tablet 3  . fluticasone (FLONASE) 50 MCG/ACT nasal spray USE 2 SPRAYS INTO BOTH NOSTRILS ONCE DAILY AS DIRECTED BY PHYSICIAN. 48 g 2  . Fluticasone Furoate (ARNUITY ELLIPTA) 100 MCG/ACT AEPB Inhale 1 puff into the lungs daily. 30 each 5  . furosemide (LASIX) 20 MG tablet Take 20 mg by mouth daily as needed.  10  . gabapentin (NEURONTIN) 100 MG capsule TAKE (1) CAPSULE BY MOUTH THREE TIMES A DAY 90 capsule 0  . HYDROcodone-acetaminophen (NORCO/VICODIN) 5-325 MG tablet Take 1 tablet by mouth every 8 (eight) hours as needed for moderate pain.    Lenda Kelp FE 1/20 1-20 MG-MCG tablet TAKE (1) TABLET BY MOUTH EVERY DAY 28 tablet 3  . losartan (COZAAR) 50 MG tablet TAKE (1) TABLET BY MOUTH EVERY DAY 30 tablet 3  . magnesium 30 MG tablet Take 30 mg by mouth daily.    . montelukast (SINGULAIR) 10 MG tablet TAKE ONE (1) TABLET BY MOUTH ONCE DAILY 30 tablet 3  . traMADol (ULTRAM) 50 MG tablet Take 1 tablet (50 mg total) by mouth every 6 (six) hours as needed. 60 tablet 2  . traZODone (DESYREL) 50 MG tablet TAKE 1/2-1 TABLET BY MOUTH AT BEDTIME ASNEEDED FOR SLEEP 30 tablet 5   . TROKENDI XR 100 MG CP24 100 mg daily.   4  . valACYclovir (VALTREX) 1000 MG tablet Take 1 tablet (1,000 mg total) by mouth 2 (two) times daily as needed (fever blisters). 20 tablet 3   No current facility-administered medications for this visit.      Allergies:   Adhesive [tape]; Coreg [carvedilol]; Metoprolol; Penicillin g; Prednisone; Latex; and Levofloxacin   Social History:  The patient  reports that she has never smoked. She has never used smokeless tobacco. She reports that she drinks alcohol. She reports that she does not use drugs.   Family History:   family history includes Breast cancer (age of onset: 61) in her maternal grandmother; Cancer in her paternal grandfather; Cancer (age of onset: 7) in her mother; Cancer (age of onset: 77) in her maternal grandmother; Diabetes in her maternal grandmother; Pneumonia in her father; Supraventricular tachycardia in her daughter.    Review of Systems: Review of Systems  Constitutional: Negative.   Respiratory: Negative.  Cardiovascular: Negative.   Gastrointestinal: Negative.   Musculoskeletal: Positive for back pain and myalgias.  Neurological: Negative.   Psychiatric/Behavioral: Negative.   All other systems reviewed and are negative.    PHYSICAL EXAM: VS:  BP 114/70 (BP Location: Right Arm, Patient Position: Sitting, Cuff Size: Normal)   Pulse 75   Ht 5' 5.5" (1.664 m)   Wt 177 lb 8 oz (80.5 kg)   BMI 29.09 kg/m  , BMI Body mass index is 29.09 kg/m. GEN: Well nourished, well developed, in no acute distress  HEENT: normal  Neck: no JVD, carotid bruits, or masses Cardiac: RRR; no murmurs, rubs, or gallops,no edema  Respiratory:  clear to auscultation bilaterally, normal work of breathing GI: soft, nontender, nondistended, + BS MS: no deformity or atrophy  Skin: warm and dry, no rash Neuro:  Strength and sensation are intact Psych: euthymic mood, full affect    Recent Labs: 02/24/2016: B Natriuretic Peptide  73.5 03/31/2016: BUN 15; Creatinine, Ser 1.18; Hemoglobin 13.4; Platelets 206; Potassium 3.4; Sodium 137    Lipid Panel Lab Results  Component Value Date   CHOL 236 (H) 08/17/2015   HDL 50.90 08/17/2015   LDLCALC 152 (H) 08/17/2015   TRIG 164.0 (H) 08/17/2015      Wt Readings from Last 3 Encounters:  08/26/16 177 lb 8 oz (80.5 kg)  07/29/16 173 lb 12 oz (78.8 kg)  07/26/16 175 lb (79.4 kg)       ASSESSMENT AND PLAN:  Cardiomyopathy, dilated, nonischemic ([place]) - Plan: EKG 12-Lead Echocardiogram performed yesterday reviewed with her in detail with ejection fraction 45-50% Atrial septal wall motion abnormality likely secondary to conduction abnormality Discussed with her in detail  Localized edema - Plan: EKG 12-Lead  no significant leg swelling, recommended she stay on Lasix   Chronic diastolic CHF (congestive heart failure) ([place]) - Plan: EKG 12-Lead Appears relatively euvolemic We will add potassium 10 mEq daily Weight is stable  Chronic fatigue - Plan: EKG 12-Lead Recommended regular walking program in her swimming pool, water walking Recommended low impact exercises given her chronic knee pain  S/P ICD (internal cardiac defibrillator) procedure - Plan: EKG 12-Lead Followed by Dr. Caryl Comes Epicardial lead placement   Total encounter time more than 25 minutes  Greater than 50% was spent in counseling and coordination of care with the patient   Disposition:   F/U  12 months   Orders Placed This Encounter  Procedures  . EKG 12-Lead     Signed, Esmond Plants, M.D., Ph.D. 08/26/2016  [place], [place]

## 2016-08-25 ENCOUNTER — Other Ambulatory Visit: Payer: Self-pay

## 2016-08-25 ENCOUNTER — Ambulatory Visit (INDEPENDENT_AMBULATORY_CARE_PROVIDER_SITE_OTHER): Payer: BLUE CROSS/BLUE SHIELD

## 2016-08-25 DIAGNOSIS — I5022 Chronic systolic (congestive) heart failure: Secondary | ICD-10-CM | POA: Diagnosis not present

## 2016-08-25 DIAGNOSIS — I428 Other cardiomyopathies: Secondary | ICD-10-CM | POA: Diagnosis not present

## 2016-08-26 ENCOUNTER — Encounter: Payer: Self-pay | Admitting: Cardiovascular Disease

## 2016-08-26 ENCOUNTER — Ambulatory Visit (INDEPENDENT_AMBULATORY_CARE_PROVIDER_SITE_OTHER): Payer: BLUE CROSS/BLUE SHIELD | Admitting: Cardiovascular Disease

## 2016-08-26 VITALS — BP 114/70 | HR 75 | Ht 65.5 in | Wt 177.5 lb

## 2016-08-26 DIAGNOSIS — R5382 Chronic fatigue, unspecified: Secondary | ICD-10-CM

## 2016-08-26 DIAGNOSIS — I420 Dilated cardiomyopathy: Secondary | ICD-10-CM

## 2016-08-26 DIAGNOSIS — R600 Localized edema: Secondary | ICD-10-CM | POA: Diagnosis not present

## 2016-08-26 DIAGNOSIS — Z95810 Presence of automatic (implantable) cardiac defibrillator: Secondary | ICD-10-CM

## 2016-08-26 DIAGNOSIS — I5032 Chronic diastolic (congestive) heart failure: Secondary | ICD-10-CM

## 2016-08-26 MED ORDER — POTASSIUM CHLORIDE ER 10 MEQ PO TBCR: 10.0000 meq | EXTENDED_RELEASE_TABLET | Freq: Every day | ORAL | 3 refills | Status: DC

## 2016-08-26 NOTE — Patient Instructions (Addendum)
Medication Instructions:   Please take potassium daily  Research zetia for cholesterol lowering  Labwork:  No new labs needed  Testing/Procedures:  No further testing at this time   Follow-Up: It was a pleasure seeing you in the office today. Please call us if you have new issues that need to be addressed before your next appt.  [phone]  Your physician wants you to follow-up in: 12 months.  You will receive a reminder letter in the mail two months in advance. If you don't receive a letter, please call our office to schedule the follow-up appointment.  If you need a refill on your cardiac medications before your next appointment, please call your pharmacy.

## 2016-08-28 ENCOUNTER — Telehealth: Payer: Self-pay

## 2016-08-28 ENCOUNTER — Other Ambulatory Visit: Payer: Self-pay | Admitting: *Deleted

## 2016-08-28 DIAGNOSIS — I5022 Chronic systolic (congestive) heart failure: Secondary | ICD-10-CM

## 2016-08-28 DIAGNOSIS — I429 Cardiomyopathy, unspecified: Secondary | ICD-10-CM

## 2016-08-28 NOTE — Telephone Encounter (Signed)
Pt is aware of normal heart muscle function as well as slight change in EF. She was a little concerned with slight change. I told her that the repeat study in 6 months will give [name] a better vantage point of the trend if one exists at all. She is agreeable to repeat echo in 6 months

## 2016-08-29 ENCOUNTER — Ambulatory Visit (INDEPENDENT_AMBULATORY_CARE_PROVIDER_SITE_OTHER): Payer: BLUE CROSS/BLUE SHIELD

## 2016-08-29 DIAGNOSIS — I5032 Chronic diastolic (congestive) heart failure: Secondary | ICD-10-CM | POA: Diagnosis not present

## 2016-08-29 DIAGNOSIS — Z95810 Presence of automatic (implantable) cardiac defibrillator: Secondary | ICD-10-CM

## 2016-08-29 NOTE — Progress Notes (Signed)
EPIC Encounter for ICM Monitoring  Patient Name: Suzanne Spears is a 56 y.o. female Date: 08/29/2016 Primary Care Physican: [name], MD Primary Cardiologist: Rockey Situ Electrophysiologist: Caryl Comes Dry Weight: 174.6 lbs  Bi-V Pacing:  98.4%       1st ICM remote.  Heart Failure questions reviewed, pt asymptomatic.  She stated she is feeling fine and denied any leg swelling that she had previously in June.     Thoracic impedance abnormal suggesting fluid accumulation from 07/30/2016 to 08/10/2016 and starting 6/30/201.  Prescribed dosage: Furosemide 20 mg 1 table as needed. Potassium 10 mEq 1 tablet daily  Recommendations:  Advised to take PRN Furosemide 20 mg 1 tablet daily x 2 days and then return to PRN.   Follow-up plan: ICM clinic phone appointment on 09/04/2016 recheck fluid levels.    Copy of ICM check sent to primary cardiologist and device physician.   3 month ICM trend: 08/29/2016   1 Year ICM trend:      [name], RN 08/29/2016 8:38 AM

## 2016-09-04 ENCOUNTER — Ambulatory Visit (INDEPENDENT_AMBULATORY_CARE_PROVIDER_SITE_OTHER): Payer: BLUE CROSS/BLUE SHIELD

## 2016-09-04 DIAGNOSIS — Z95810 Presence of automatic (implantable) cardiac defibrillator: Secondary | ICD-10-CM

## 2016-09-04 DIAGNOSIS — I5032 Chronic diastolic (congestive) heart failure: Secondary | ICD-10-CM

## 2016-09-04 NOTE — Progress Notes (Signed)
EPIC Encounter for ICM Monitoring  Patient Name: Suzanne Spears is a 56 y.o. female Date: 09/04/2016 Primary Care Physican: [name], MD Primary Cardiologist: Rockey Situ Electrophysiologist: Caryl Comes Dry Weight:   175 lbs  Bi-V Pacing:  98.4%              Heart Failure questions reviewed, pt asymptomatic.   Thoracic impedance returned to normal after taking prn Furosemide x 2 days.      Prescribed dosage: Furosemide 20 mg 1 table as needed. Potassium 10 mEq 1 tablet daily  Recommendations: No changes. Discussed fluid intake and she thinks she may be drinking more than 64 oz daily.  Advised to monitor intake.   Encouraged to call for fluid symptoms.  Follow-up plan: ICM clinic phone appointment on 09/29/2016.    Copy of ICM check sent to device physician.   3 month ICM trend: 09/04/2016   1 Year ICM trend:      [name], RN 09/04/2016 9:43 AM

## 2016-09-11 ENCOUNTER — Encounter: Payer: Self-pay | Admitting: Internal Medicine

## 2016-09-16 ENCOUNTER — Other Ambulatory Visit: Payer: Self-pay | Admitting: Internal Medicine

## 2016-09-24 ENCOUNTER — Encounter: Payer: Self-pay | Admitting: Internal Medicine

## 2016-09-24 ENCOUNTER — Other Ambulatory Visit: Payer: Self-pay | Admitting: Internal Medicine

## 2016-09-24 MED ORDER — DULOXETINE HCL 60 MG PO CPEP: 60.0000 mg | ORAL_CAPSULE | Freq: Every day | ORAL | 2 refills | Status: DC

## 2016-09-29 ENCOUNTER — Ambulatory Visit (INDEPENDENT_AMBULATORY_CARE_PROVIDER_SITE_OTHER): Payer: BLUE CROSS/BLUE SHIELD

## 2016-09-29 DIAGNOSIS — I5032 Chronic diastolic (congestive) heart failure: Secondary | ICD-10-CM

## 2016-09-29 DIAGNOSIS — Z95810 Presence of automatic (implantable) cardiac defibrillator: Secondary | ICD-10-CM

## 2016-09-29 NOTE — Progress Notes (Signed)
Attempted call back to patient and left message for her to return call regarding use of defib with TENS unit.

## 2016-09-29 NOTE — Progress Notes (Signed)
EPIC Encounter for ICM Monitoring  Patient Name: Suzanne Spears is a 56 y.o. female Date: 09/29/2016 Primary Care Physican: [name], MD Primary Cardiologist: Rockey Situ Electrophysiologist: Caryl Comes Dry Weight:173lbs  Bi-V Pacing: 98.3%      Heart Failure questions reviewed, pt asymptomatic.   Patient has been having some neck and shoulder pain.  She asked if she can use a TENS unit with her device.     Thoracic impedance normal   Prescribed dosage: Furosemide 20 mg 1 table as needed. Potassium 10 mEq 1 tablet daily  Labs: 03/31/2016 Creatinine 1.18, BUN 15, Potassium 3.4, Sodium 137, EGFR 51-59  Recommendations: No changes.   Encouraged to call for fluid symptoms.  Follow-up plan: ICM clinic phone appointment on 10/30/2016.    Copy of ICM check sent to device physician.   3 month ICM trend: 09/29/2016   1 Year ICM trend:      [name], RN 09/29/2016 10:13 AM

## 2016-09-30 NOTE — Progress Notes (Signed)
Spoke with patient and explained she will need to have an office appointment set up so the [employer] Rep can check if she can use a TENS unit.  Provided device clinic number so she can set up at her convenience.  She verbalized understanding.

## 2016-10-03 ENCOUNTER — Other Ambulatory Visit: Payer: Self-pay | Admitting: Student

## 2016-10-03 ENCOUNTER — Other Ambulatory Visit: Payer: Self-pay | Admitting: Internal Medicine

## 2016-10-03 DIAGNOSIS — M25562 Pain in left knee: Secondary | ICD-10-CM

## 2016-10-13 ENCOUNTER — Ambulatory Visit: Payer: BLUE CROSS/BLUE SHIELD

## 2016-10-13 ENCOUNTER — Other Ambulatory Visit: Payer: Self-pay | Admitting: Surgery

## 2016-10-15 ENCOUNTER — Ambulatory Visit
Admission: RE | Admit: 2016-10-15 | Discharge: 2016-10-15 | Disposition: A | Discharge status: Home / Self Care | Payer: BLUE CROSS/BLUE SHIELD | Source: Ambulatory Visit | Attending: Student | Admitting: Student

## 2016-10-15 DIAGNOSIS — M25562 Pain in left knee: Secondary | ICD-10-CM | POA: Diagnosis not present

## 2016-10-16 ENCOUNTER — Telehealth: Payer: Self-pay | Admitting: Internal Medicine

## 2016-10-16 NOTE — Telephone Encounter (Signed)
Patient came to [place] office for an appointment as she was not aware that it was made at [address].  Patient very upset about mis communication and having to prolong wait to start pain control treatment.    Patient scheduled next available in [facility] 8/28 at 4 .  Per Marland Mcalpine in [employer] was called.   Is there in instruction as to what she should do in the meantime as far as the tens unit - this appointment is to approve using with device.

## 2016-10-16 NOTE — Telephone Encounter (Signed)
Patient coming in 10/21/16 for device check with [employer] rep.

## 2016-10-20 ENCOUNTER — Encounter: Payer: Self-pay | Admitting: Cardiovascular Disease

## 2016-10-21 ENCOUNTER — Ambulatory Visit (INDEPENDENT_AMBULATORY_CARE_PROVIDER_SITE_OTHER): Payer: Self-pay | Admitting: *Deleted

## 2016-10-21 DIAGNOSIS — Z95810 Presence of automatic (implantable) cardiac defibrillator: Secondary | ICD-10-CM

## 2016-10-21 DIAGNOSIS — I5032 Chronic diastolic (congestive) heart failure: Secondary | ICD-10-CM

## 2016-10-21 DIAGNOSIS — I428 Other cardiomyopathies: Secondary | ICD-10-CM

## 2016-10-21 LAB — CUP PACEART INCLINIC DEVICE CHECK
Date Time Interrogation Session: 20180828173203
Implantable Lead Implant Date: 20160421
Implantable Lead Implant Date: 20160421
Implantable Lead Implant Date: 20161006
Implantable Lead Implant Date: 20161006
Implantable Lead Location: 753858
Implantable Lead Location: 753858
Implantable Lead Location: 753859
Implantable Lead Location: 753860
Implantable Lead Model: 5071
Implantable Lead Model: 5071
Implantable Lead Model: 5076
Implantable Pulse Generator Implant Date: 20160421

## 2016-10-21 NOTE — Progress Notes (Signed)
Patient presented in clinic for device check while using TENS unit.  No episodes noted, estimated remaining battery longevity is 3.6 years.  Industry representative present and performed interrogation.  60 cycle artifact noted on LECG during TENS unit use, but not sensed by device or noted on near-field EGMs.  Patient was unable to test TENS unit at some settings due to malfunction of the unit, so she will plan to purchase a new unit and call us to schedule another Device Clinic check with industry present to check for interference.  Patient is aware that she should not use another TENS unit without this prior safety check.  [facility] on 10/30/16 as scheduled.

## 2016-10-23 ENCOUNTER — Encounter: Payer: Self-pay | Admitting: Cardiovascular Disease

## 2016-10-29 ENCOUNTER — Encounter: Payer: Self-pay | Admitting: Internal Medicine

## 2016-10-29 ENCOUNTER — Encounter: Payer: Self-pay | Admitting: Cardiovascular Disease

## 2016-10-30 ENCOUNTER — Ambulatory Visit (INDEPENDENT_AMBULATORY_CARE_PROVIDER_SITE_OTHER): Payer: BLUE CROSS/BLUE SHIELD | Admitting: *Deleted

## 2016-10-30 DIAGNOSIS — I428 Other cardiomyopathies: Secondary | ICD-10-CM

## 2016-10-30 DIAGNOSIS — Z95810 Presence of automatic (implantable) cardiac defibrillator: Secondary | ICD-10-CM

## 2016-10-30 DIAGNOSIS — I5032 Chronic diastolic (congestive) heart failure: Secondary | ICD-10-CM

## 2016-10-30 NOTE — Progress Notes (Signed)
EPIC Encounter for ICM Monitoring  Patient Name: GLORIS SHIROMA is a 56 y.o. female Date: 10/30/2016 Primary Care Physican: [name], MD Primary Cardiologist: Rockey Situ Electrophysiologist: Caryl Comes Dry Weight:Previous ICM weight 173lbs  Bi-V Pacing: 98.5%      Heart Failure questions reviewed, pt asymptomatic.   Thoracic impedance normal.  Prescribed dosage: Furosemide 20 mg 1 table as needed. Potassium 10 mEq 1 tablet daily  Labs: 03/31/2016 Creatinine 1.18, BUN 15, Potassium 3.4, Sodium 137, EGFR 51-59  Recommendations: No changes.   Encouraged to call for fluid symptoms.  Follow-up plan: ICM clinic phone appointment on 12/05/2016. Office appointment scheduled 11/18/2016 with [employer] Rep to check TENs Unit.   Copy of ICM check sent to Dr. Caryl Comes.   3 month ICM trend: 10/30/2016   1 Year ICM trend:      [name], RN 10/30/2016 2:53 PM

## 2016-10-31 NOTE — Progress Notes (Signed)
Remote ICD transmission.   

## 2016-11-04 ENCOUNTER — Encounter: Payer: Self-pay | Admitting: Cardiology

## 2016-11-18 ENCOUNTER — Ambulatory Visit (INDEPENDENT_AMBULATORY_CARE_PROVIDER_SITE_OTHER): Payer: BLUE CROSS/BLUE SHIELD

## 2016-11-18 ENCOUNTER — Ambulatory Visit (INDEPENDENT_AMBULATORY_CARE_PROVIDER_SITE_OTHER): Payer: BLUE CROSS/BLUE SHIELD | Admitting: *Deleted

## 2016-11-18 ENCOUNTER — Telehealth: Payer: Self-pay | Admitting: Internal Medicine

## 2016-11-18 DIAGNOSIS — Z23 Encounter for immunization: Secondary | ICD-10-CM

## 2016-11-18 DIAGNOSIS — I428 Other cardiomyopathies: Secondary | ICD-10-CM

## 2016-11-18 DIAGNOSIS — Z95810 Presence of automatic (implantable) cardiac defibrillator: Secondary | ICD-10-CM

## 2016-11-18 LAB — CUP PACEART INCLINIC DEVICE CHECK
Date Time Interrogation Session: 20180925171320
Implantable Lead Implant Date: 20160421
Implantable Lead Implant Date: 20160421
Implantable Lead Implant Date: 20161006
Implantable Lead Implant Date: 20161006
Implantable Lead Location: 753858
Implantable Lead Location: 753858
Implantable Lead Location: 753859
Implantable Lead Location: 753860
Implantable Lead Model: 5071
Implantable Lead Model: 5071
Implantable Lead Model: 5076
Implantable Pulse Generator Implant Date: 20160421

## 2016-11-18 NOTE — Progress Notes (Signed)
Patient presented in clinic for device check while using TENS unit.  [employer] representative present and performed interrogation and safety check.  No episodes noted, estimated remaining battery longevity is 3.2 years.  60 cycle artifact noted on LECG during TENS unit use, but not sensed by device or noted on near-field EGMs.  Patient applied TENS unit to her L back/shoulder and no noise was noted at highest settings.  [facility] on 12/05/16 as scheduled and ROV with SK/B on 01/27/17.

## 2016-11-18 NOTE — Telephone Encounter (Signed)
Pt dropped off FMLA paper work to be filled out. Placed in [place] colored folder upfront

## 2016-11-19 NOTE — Telephone Encounter (Signed)
Placed in red folder  

## 2016-11-19 NOTE — Telephone Encounter (Signed)
Error

## 2016-11-20 ENCOUNTER — Telehealth: Payer: Self-pay | Admitting: Internal Medicine

## 2016-11-20 DIAGNOSIS — Z0279 Encounter for issue of other medical certificate: Secondary | ICD-10-CM

## 2016-11-20 NOTE — Telephone Encounter (Signed)
FMLA completed and returned in red folder ,  The charge is $50

## 2016-11-20 NOTE — Telephone Encounter (Signed)
Pt was notified that paperwork has been completed and placed up front for pick up. Pt stated that she would be by tomorrow to pick it up. Also the charge form has been put up front for [place].

## 2016-11-22 ENCOUNTER — Other Ambulatory Visit: Payer: Self-pay | Admitting: Internal Medicine

## 2016-11-24 NOTE — Telephone Encounter (Signed)
Error

## 2016-11-25 ENCOUNTER — Telehealth: Payer: Self-pay | Admitting: Cardiovascular Disease

## 2016-11-25 LAB — CUP PACEART REMOTE DEVICE CHECK
Battery Remaining Longevity: 41 mo
Battery Voltage: 2.93 V
Brady Statistic AP VP Percent: 0.03 %
Brady Statistic AP VS Percent: 0.02 %
Brady Statistic AS VP Percent: 98.69 %
Brady Statistic AS VS Percent: 1.26 %
Brady Statistic RA Percent Paced: 0.05 %
Brady Statistic RV Percent Paced: 7.58 %
Date Time Interrogation Session: 20180906073523
HighPow Impedance: 82 Ohm
Implantable Lead Implant Date: 20160421
Implantable Lead Implant Date: 20160421
Implantable Lead Implant Date: 20161006
Implantable Lead Implant Date: 20161006
Implantable Lead Location: 753858
Implantable Lead Location: 753858
Implantable Lead Location: 753859
Implantable Lead Location: 753860
Implantable Lead Model: 5071
Implantable Lead Model: 5071
Implantable Lead Model: 5076
Implantable Pulse Generator Implant Date: 20160421
Lead Channel Impedance Value: 285 Ohm
Lead Channel Impedance Value: 342 Ohm
Lead Channel Impedance Value: 380 Ohm
Lead Channel Impedance Value: 380 Ohm
Lead Channel Impedance Value: 4047 Ohm
Lead Channel Impedance Value: 4047 Ohm
Lead Channel Pacing Threshold Amplitude: 0.625 V
Lead Channel Pacing Threshold Amplitude: 1.125 V
Lead Channel Pacing Threshold Pulse Width: 0.4 ms
Lead Channel Pacing Threshold Pulse Width: 0.4 ms
Lead Channel Sensing Intrinsic Amplitude: 1.75 mV
Lead Channel Sensing Intrinsic Amplitude: 1.75 mV
Lead Channel Sensing Intrinsic Amplitude: 31.625 mV
Lead Channel Sensing Intrinsic Amplitude: 31.625 mV
Lead Channel Setting Pacing Amplitude: 1.5 V
Lead Channel Setting Pacing Amplitude: 2 V
Lead Channel Setting Pacing Amplitude: 2.5 V
Lead Channel Setting Pacing Pulse Width: 0.4 ms
Lead Channel Setting Pacing Pulse Width: 0.8 ms
Lead Channel Setting Sensing Sensitivity: 0.3 mV

## 2016-11-25 NOTE — Telephone Encounter (Signed)
Patient brought in fmla forms roi and $25 fee.  Sent to [employer] via interoffice mail

## 2016-11-27 ENCOUNTER — Telehealth: Payer: Self-pay | Admitting: *Deleted

## 2016-11-27 ENCOUNTER — Telehealth: Payer: Self-pay | Admitting: Internal Medicine

## 2016-11-27 ENCOUNTER — Emergency Department
Admission: EM | Admit: 2016-11-27 | Discharge: 2016-11-27 | Disposition: A | Discharge status: Home / Self Care | Payer: BLUE CROSS/BLUE SHIELD | Attending: Emergency Medicine | Admitting: Emergency Medicine

## 2016-11-27 ENCOUNTER — Encounter: Payer: Self-pay | Admitting: Emergency Medicine

## 2016-11-27 DIAGNOSIS — J45909 Unspecified asthma, uncomplicated: Secondary | ICD-10-CM | POA: Diagnosis not present

## 2016-11-27 DIAGNOSIS — Z91040 Latex allergy status: Secondary | ICD-10-CM | POA: Diagnosis not present

## 2016-11-27 DIAGNOSIS — R55 Syncope and collapse: Secondary | ICD-10-CM | POA: Diagnosis not present

## 2016-11-27 DIAGNOSIS — I2510 Atherosclerotic heart disease of native coronary artery without angina pectoris: Secondary | ICD-10-CM | POA: Diagnosis not present

## 2016-11-27 DIAGNOSIS — I5032 Chronic diastolic (congestive) heart failure: Secondary | ICD-10-CM | POA: Insufficient documentation

## 2016-11-27 DIAGNOSIS — F329 Major depressive disorder, single episode, unspecified: Secondary | ICD-10-CM | POA: Insufficient documentation

## 2016-11-27 DIAGNOSIS — F419 Anxiety disorder, unspecified: Secondary | ICD-10-CM | POA: Insufficient documentation

## 2016-11-27 DIAGNOSIS — I110 Hypertensive heart disease with heart failure: Secondary | ICD-10-CM | POA: Insufficient documentation

## 2016-11-27 DIAGNOSIS — R42 Dizziness and giddiness: Secondary | ICD-10-CM | POA: Diagnosis present

## 2016-11-27 LAB — URINALYSIS, COMPLETE (UACMP) WITH MICROSCOPIC
Bacteria, UA: NONE SEEN
Bilirubin Urine: NEGATIVE
Glucose, UA: NEGATIVE mg/dL
Hgb urine dipstick: NEGATIVE
Ketones, ur: NEGATIVE mg/dL
Leukocytes, UA: NEGATIVE
Nitrite: NEGATIVE
Protein, ur: NEGATIVE mg/dL
Specific Gravity, Urine: 1.004 — ABNORMAL LOW (ref 1.005–1.030)
WBC, UA: NONE SEEN WBC/hpf (ref 0–5)
pH: 7 (ref 5.0–8.0)

## 2016-11-27 LAB — BASIC METABOLIC PANEL
Anion gap: 7 (ref 5–15)
BUN: 17 mg/dL (ref 6–20)
CO2: 23 mmol/L (ref 22–32)
Calcium: 8.9 mg/dL (ref 8.9–10.3)
Chloride: 108 mmol/L (ref 101–111)
Creatinine, Ser: 1.08 mg/dL — ABNORMAL HIGH (ref 0.44–1.00)
GFR calc Af Amer: >60 mL/min (ref >60)
GFR calc non Af Amer: 57 mL/min — ABNORMAL LOW (ref >60)
Glucose, Bld: 97 mg/dL (ref 65–99)
Potassium: 4 mmol/L (ref 3.5–5.1)
Sodium: 138 mmol/L (ref 135–145)

## 2016-11-27 LAB — CBC
HCT: 38.8 % (ref 35.0–47.0)
Hemoglobin: 13.3 g/dL (ref 12.0–16.0)
MCH: 31.9 pg (ref 26.0–34.0)
MCHC: 34.2 g/dL (ref 32.0–36.0)
MCV: 93.4 fL (ref 80.0–100.0)
Platelets: 202 10*3/uL (ref 150–440)
RBC: 4.15 MIL/uL (ref 3.80–5.20)
RDW: 13.9 % (ref 11.5–14.5)
WBC: 8.2 10*3/uL (ref 3.6–11.0)

## 2016-11-27 LAB — MAGNESIUM: Magnesium: 2 mg/dL (ref 1.7–2.4)

## 2016-11-27 LAB — BRAIN NATRIURETIC PEPTIDE: B Natriuretic Peptide: 172 pg/mL — ABNORMAL HIGH (ref 0.0–100.0)

## 2016-11-27 LAB — GLUCOSE, CAPILLARY: Glucose-Capillary: 87 mg/dL (ref 65–99)

## 2016-11-27 LAB — TROPONIN I: Troponin I: <0.03 ng/mL (ref <0.03)

## 2016-11-27 MED ORDER — BUTALBITAL-APAP-CAFFEINE 50-325-40 MG PO TABS: 1.0000 | ORAL_TABLET | Freq: Four times a day (QID) | ORAL | 0 refills | Status: DC | PRN

## 2016-11-27 NOTE — ED Notes (Addendum)
Pacemaker interrogated per Dr. Lucita Lora order, fax report given to Dr. Lucita Lora, add on lab orders recvieved

## 2016-11-27 NOTE — ED Triage Notes (Signed)
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 Pt reports episode of blurred vision, lightheadedness, chest pain and elevated blood pressure. Pt reports has been having trouble with blurred vision for two days. Pt alert and oriented in triage. Facial symmetry intact. Bilateral strong hand grips noted. Pt reports blurred vision has resolved at present. Speech clear.

## 2016-11-27 NOTE — Telephone Encounter (Signed)
Patient Name: Suzanne Spears DOB: January 05, 1961 Initial Comment Caller states she is having vision issues while looking at he computer screen while at work and has a Hx of Congestive Heart Failure. Caller states she is also experiencing low blood pressure and some dizziness and tightness in her chest. Caller states she fills different. Nurse Assessment Nurse: Vallery Sa, RN, [place] Date/Time (Eastern Time): 11/27/2016 12:26:35 PM Confirm and document reason for call. If symptomatic, describe symptoms. ---Caller states she developed chest pain (rated as moderate), dizziness, and vision changes about 10 minutes ago. Her blood pressure was 149/95. No severe breathing difficulty or blueness around her lips. Alert and responsive. Does the patient have any new or worsening symptoms? ---Yes Will a triage be completed? ---Yes Related visit to physician within the last 2 weeks? ---No Does the PT have any chronic conditions? (i.e. diabetes, asthma, etc.) ---Yes List chronic conditions. ---CHF, MVP, Defibrillator, Left Bundle Block, Migraines, Asthma Is this a behavioral health or substance abuse call? ---No Guidelines Guideline Title Affirmed Question Affirmed Notes Chest Pain [1] Chest pain lasts > 5 minutes AND [2] history of heart disease (i.e., heart attack, bypass surgery, angina, angioplasty, CHF; not just a heart murmur) Final Disposition User Call EMS 57 Now Vallery Sa, RN, [place] Caller Disagree/Comply Comply Caller Understands Yes PreDisposition Call Doctor

## 2016-11-27 NOTE — Telephone Encounter (Signed)
Patient is currently in ED 

## 2016-11-27 NOTE — Telephone Encounter (Signed)
fyi
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
## 2016-11-27 NOTE — Telephone Encounter (Signed)
Pt has a HX of congestive heart failure. Pt has not been feeling well , pt has had 2 resting blood pressures of 149/95 and 141/91(with in minutes of each other) . Pt also had blurred vision while working on her computer, pt also having troubled seeing object and people in front her . Earlier in the week pt was having optical headaches as well  *pt transferred to [facility]  Pt contact [phone]

## 2016-11-27 NOTE — Telephone Encounter (Signed)
See other phone note

## 2016-11-30 ENCOUNTER — Encounter: Payer: Self-pay | Admitting: Internal Medicine

## 2016-11-30 DIAGNOSIS — R42 Dizziness and giddiness: Secondary | ICD-10-CM | POA: Insufficient documentation

## 2016-12-01 ENCOUNTER — Ambulatory Visit (INDEPENDENT_AMBULATORY_CARE_PROVIDER_SITE_OTHER): Payer: BLUE CROSS/BLUE SHIELD | Admitting: Internal Medicine

## 2016-12-01 ENCOUNTER — Encounter: Payer: Self-pay | Admitting: Internal Medicine

## 2016-12-01 DIAGNOSIS — G43109 Migraine with aura, not intractable, without status migrainosus: Secondary | ICD-10-CM | POA: Diagnosis not present

## 2016-12-01 DIAGNOSIS — R42 Dizziness and giddiness: Secondary | ICD-10-CM | POA: Diagnosis not present

## 2016-12-01 DIAGNOSIS — R6889 Other general symptoms and signs: Secondary | ICD-10-CM

## 2016-12-01 LAB — POCT INFLUENZA A/B
Influenza A, POC: NEGATIVE
Influenza B, POC: NEGATIVE

## 2016-12-01 MED ORDER — HYDROCOD POLST-CPM POLST ER 10-8 MG/5ML PO SUER: 5.0000 mL | Freq: Every evening | ORAL | 0 refills | Status: DC | PRN

## 2016-12-01 NOTE — Patient Instructions (Signed)
Your flu test was negative  You appear to have a viral syndrome , and should rest today     I would ask Dr Manuella Ghazi about increasing the topiramate since your ocular migraines are increasing in frequency  Use the tussionex for the nighttime cough

## 2016-12-01 NOTE — Progress Notes (Signed)
Subjective:  Patient ID: Suzanne Spears, female    DOB: [birth year]  Age: 56 y.o. MRN: [id number]  CC: Diagnoses of Flu-like symptoms, Light headedness, and Ocular migraine were pertinent to this visit.  HPI Suzanne Spears presents for evaluation of cough, with shortness of  of breath,  Headache and night sweats.  Evaluated in ED last week for sudden onset of blurry vision which occurred at work, accompanied by mildly elevated blood pressure.  Pacemaker interrogated,  Diagnosed with ocular migraine and near syncope ,   fioricet prescribed for ocular migraines  Since her previously prescribed medication was too expensive. Since ER visit has not been feeling well   Accompanied by husband today.  Husband does not contribute to conversation.    Multiple symptoms reported. Since oct 5,  Has BEEN feeling run down , having nonproductive cough   Chest TIGHTNESS  night sweats, and nausea .  Went to work on Friday BUT STAYED [employer] [facility]. Daughter started feeling poorly as well,  No fevers.  Saturday started coughing,  Chest feels raw had an episode of severe light headedness accompanied by severe nausea .  Hasn't felt right since. Worried she may have contracted the flu.   Has been having headaches  Since then not similar to her ocular migraines.   Ocular migraines have occurring more frequently lately,  Has at times had total loss of vision in left eye .  Usually does not have a headache. Has been prescribed something by Manuella Ghazi for migraine  headache pain that is too $$$ (not sure what)    Outpatient Medications Prior to Visit  Medication Sig Dispense Refill  . albuterol (PROVENTIL HFA;VENTOLIN HFA) 108 (90 BASE) MCG/ACT inhaler Inhale 2 puffs into the lungs daily as needed for wheezing or shortness of breath.    [facility] albuterol (PROVENTIL) (2.5 MG/3ML) 0.083% nebulizer solution Take 3 mLs (2.5 mg total) by nebulization every 6 (six) hours as needed for wheezing or shortness  of breath. 150 mL 1  . ARNUITY ELLIPTA 100 MCG/ACT AEPB INHALE 1 PUFF BY MOUTH INTO THE LUNGS DAILY AS DIRECTED BY PHYSICIAN. 30 each 4  . Azelastine-Fluticasone 137-50 MCG/ACT SUSP Place 1 spray into the nose 2 (two) times daily. 23 g 5  . baclofen (LIORESAL) 10 MG tablet Take 10 mg by mouth 2 (two) times daily.   0  . butalbital-acetaminophen-caffeine (FIORICET, ESGIC) 50-325-40 MG tablet Take 1-2 tablets by mouth every 6 (six) hours as needed for headache. 20 tablet 0  . cetirizine (ZYRTEC) 10 MG chewable tablet Chew 10 mg by mouth daily.    . citalopram (CELEXA) 20 MG tablet TAKE (1) TABLET BY MOUTH EVERY DAY 30 tablet 3  . cyanocobalamin (,VITAMIN B-12,) 1000 MCG/ML injection INJECT 1 ML INTO THE MUSCLE ONCE A WEEK 4 mL 3  . DULoxetine (CYMBALTA) 60 MG capsule Take 1 capsule (60 mg total) by mouth daily. 30 capsule 2  . etodolac (LODINE) 500 MG tablet Take 1 tablet (500 mg total) by mouth 2 (two) times daily. 60 tablet 3  . fluticasone (FLONASE) 50 MCG/ACT nasal spray USE 2 SPRAYS INTO BOTH NOSTRILS ONCE DAILY AS DIRECTED BY PHYSICIAN. 48 g 2  . Fluticasone Furoate (ARNUITY ELLIPTA) 100 MCG/ACT AEPB Inhale 1 puff into the lungs daily. 30 each 5  . furosemide (LASIX) 20 MG tablet Take 20 mg by mouth daily as needed.  10  . gabapentin (NEURONTIN) 100 MG capsule TAKE ONE (1)  CAPSULE THREE (3) TIMES EACH DAY 90 capsule 1  . HYDROcodone-acetaminophen (NORCO/VICODIN) 5-325 MG tablet Take 1 tablet by mouth every 8 (eight) hours as needed for moderate pain.    Lenda Kelp FE 1/20 1-20 MG-MCG tablet TAKE (1) TABLET BY MOUTH EVERY DAY 28 tablet 3  . losartan (COZAAR) 50 MG tablet TAKE (1) TABLET BY MOUTH EVERY DAY 30 tablet 3  . magnesium 30 MG tablet Take 30 mg by mouth daily.    . montelukast (SINGULAIR) 10 MG tablet TAKE ONE (1) TABLET BY MOUTH ONCE DAILY 30 tablet 3  . potassium chloride (K-DUR) 10 MEQ tablet Take 1 tablet (10 mEq total) by mouth daily. 90 tablet 3  . traMADol (ULTRAM) 50 MG tablet  Take 1 tablet (50 mg total) by mouth every 6 (six) hours as needed. 60 tablet 2  . traZODone (DESYREL) 50 MG tablet TAKE 1/2 TO 1 TABLET BY MOUTH AT BEDTIMEAS NEEDED FOR SLEEP 30 tablet 1  . TROKENDI XR 100 MG CP24 100 mg daily.   4  . valACYclovir (VALTREX) 1000 MG tablet Take 1 tablet (1,000 mg total) by mouth 2 (two) times daily as needed (fever blisters). 20 tablet 3   No facility-administered medications prior to visit.     Review of Systems;  Patient denies headache, fevers, , unintentional weight loss, skin rash, eye pain, sinus congestion and sinus pain, sore throat, dysphagia,  hemoptysis , , dyspnea, orthopnea,  Fluid retention, wheezing,  palpitations, orthopnea, edema, abdominal pain, nausea, melena, diarrhea, constipation, flank pain, dysuria, hematuria, urinary  Frequency, nocturia, numbness, tingling, seizures,  Focal weakness, Loss of consciousness,  Tremor, insomnia, depression, anxiety, and suicidal ideation.      Objective:  BP 124/82 (BP Location: Left Arm, Patient Position: Sitting, Cuff Size: Normal)   Pulse 85   Temp 97.8 F (36.6 C) (Oral)   Resp 15   Ht 5\' 6"  (1.676 m)   Wt 172 lb 12.8 oz (78.4 kg)   SpO2 98%   BMI 27.89 kg/m   BP Readings from Last 3 Encounters:  12/01/16 124/82  11/27/16 (!) 144/83  08/26/16 114/70    Wt Readings from Last 3 Encounters:  12/01/16 172 lb 12.8 oz (78.4 kg)  11/27/16 175 lb (79.4 kg)  08/26/16 177 lb 8 oz (80.5 kg)    General appearance: alert, cooperative and appears stated age Ears: normal TM's and external ear canals both ears Throat: lips, mucosa, and tongue normal; teeth and gums normal Neck: no adenopathy, no carotid bruit, supple, symmetrical, trachea midline and thyroid not enlarged, symmetric, no tenderness/mass/nodules Back: symmetric, no curvature. ROM normal. No CVA tenderness. Lungs: clear to auscultation bilaterally Heart: regular rate and rhythm, S1, S2 normal, no murmur, click, rub or  gallop Abdomen: soft, non-tender; bowel sounds normal; no masses,  no organomegaly Pulses: 2+ and symmetric Skin: Skin color, texture, turgor normal. No rashes or lesions Lymph nodes: Cervical, supraclavicular, and axillary nodes normal.  No results found for: HGBA1C  Lab Results  Component Value Date   CREATININE 1.08 (H) 11/27/2016   CREATININE 1.18 (H) 03/31/2016   CREATININE 1.16 (H) 02/24/2016    Lab Results  Component Value Date   WBC 8.2 11/27/2016   HGB 13.3 11/27/2016   HCT 38.8 11/27/2016   PLT 202 11/27/2016   GLUCOSE 97 11/27/2016   CHOL 236 (H) 08/17/2015   TRIG 164.0 (H) 08/17/2015   HDL 50.90 08/17/2015   LDLDIRECT 151.6 05/01/2011   LDLCALC 152 (H) 08/17/2015   ALT  10 08/17/2015   AST 13 08/17/2015   NA 138 11/27/2016   K 4.0 11/27/2016   CL 108 11/27/2016   CREATININE 1.08 (H) 11/27/2016   BUN 17 11/27/2016   CO2 23 11/27/2016   TSH 3.01 08/17/2015   INR 2.8 03/27/2015    No results found.  Assessment & Plan:   Problem List Items Addressed This Visit    Flu-like symptoms    She has no history of fevers and rapid flu test was negative.  She has been vaccinated previously.  Tussionex prn cough rx written. Work noted for today written,       Relevant Orders   POCT Influenza A/B (Completed)   Light headedness    Likely secondary to viral URI .[facility] ER records reviewed,  patient reexamined and reassured that she has no sigs of pneumonia,  Influenza,  Or heart failure.        Ocular migraine    Frequency is increasing per patient despite use of topiramate prescribed by Dr Manuella Ghazi.  Advised to discuss increasing the dose or changing therapy.  Advised to refrain from using fioricet for aura only.          I am having Suzanne Spears start on chlorpheniramine-HYDROcodone. I am also having her maintain her magnesium, albuterol, valACYclovir, TROKENDI XR, baclofen, albuterol, ARNUITY ELLIPTA, HYDROcodone-acetaminophen, Fluticasone Furoate, Azelastine-Fluticasone,  cyanocobalamin, fluticasone, cetirizine, etodolac, traMADol, furosemide, JUNEL FE 1/20, montelukast, potassium chloride, traZODone, gabapentin, DULoxetine, citalopram, losartan, and butalbital-acetaminophen-caffeine.  Meds ordered this encounter  Medications  . chlorpheniramine-HYDROcodone (TUSSIONEX PENNKINETIC ER) 10-8 MG/5ML SUER    Sig: Take 5 mLs by mouth at bedtime as needed for cough.    Dispense:  140 mL    Refill:  0    There are no discontinued medications.  Follow-up: No Follow-up on file.   [name], MD

## 2016-12-02 ENCOUNTER — Encounter: Payer: Self-pay | Admitting: Internal Medicine

## 2016-12-02 DIAGNOSIS — G43109 Migraine with aura, not intractable, without status migrainosus: Secondary | ICD-10-CM | POA: Insufficient documentation

## 2016-12-02 DIAGNOSIS — R6889 Other general symptoms and signs: Secondary | ICD-10-CM | POA: Insufficient documentation

## 2016-12-02 NOTE — Assessment & Plan Note (Signed)
Frequency is increasing per patient despite use of topiramate prescribed by Dr Manuella Ghazi.  Advised to discuss increasing the dose or changing therapy.  Advised to refrain from using fioricet for aura only.

## 2016-12-02 NOTE — Telephone Encounter (Signed)
[employer] paperwork placed on Pam's desk for Dr. Rockey Situ

## 2016-12-02 NOTE — Telephone Encounter (Signed)
Received paperwork from [place] to be filled by doctor Placed in nurses bin

## 2016-12-02 NOTE — Assessment & Plan Note (Signed)
Likely secondary to viral URI .[facility] records reviewed,  patient reexamined and reassured that she has no sigs of pneumonia,  Influenza,  Or heart failure.

## 2016-12-02 NOTE — Assessment & Plan Note (Signed)
She has no history of fevers and rapid flu test was negative.  She has been vaccinated previously.  Tussionex prn cough rx written. Work noted for today written,

## 2016-12-05 ENCOUNTER — Ambulatory Visit (INDEPENDENT_AMBULATORY_CARE_PROVIDER_SITE_OTHER): Payer: BLUE CROSS/BLUE SHIELD

## 2016-12-05 DIAGNOSIS — Z95810 Presence of automatic (implantable) cardiac defibrillator: Secondary | ICD-10-CM | POA: Diagnosis not present

## 2016-12-05 DIAGNOSIS — I5032 Chronic diastolic (congestive) heart failure: Secondary | ICD-10-CM | POA: Diagnosis not present

## 2016-12-05 NOTE — Progress Notes (Signed)
EPIC Encounter for ICM Monitoring  Patient Name: Suzanne Spears is a 56 y.o. female Date: 12/05/2016 Primary Care Physican: Tullo, Teresa L, MD Primary Cardiologist: Gollan Electrophysiologist: Klein Dry Weight:178lbs  Bi-V Pacing: 98.6%      Heart Failure questions reviewed, pt symptomatic with weight increase a few pounds and some swelling of the legs.   Thoracic impedance abnormal suggesting fluid accumulation since 12/03/2016.  Prescribed dosage: Furosemide 20 mg 1 table as needed. Potassium 10 mEq 1 tablet daily  Labs: 11/27/2016 Creatinine 1.08, BUN 17, Potassium 4.0, Sodium 138, EGFR 57->60 03/31/2016 Creatinine 1.18, BUN 15, Potassium 3.4, Sodium 137, EGFR 51-59  Recommendations:  She reported eating foods high in salt for last 2 days and has already taken a PRN Furosemide today and will take one for the next 2 days.    Follow-up plan: ICM clinic phone appointment on 01/05/2017.  Office appointment scheduled 01/27/2017 with Dr. Klein.  Copy of ICM check sent to Dr. Klein and Dr. Gollan.   3 month ICM trend: 12/05/2016   1 Year ICM trend:      Laurie S Short, RN 12/05/2016 7:55 AM    

## 2016-12-08 NOTE — Telephone Encounter (Signed)
Patient checking on status of forms.  She has a deadline and needs to turn in please call with ETA when they will be ready

## 2016-12-10 ENCOUNTER — Telehealth: Payer: Self-pay | Admitting: Cardiovascular Disease

## 2016-12-10 NOTE — Telephone Encounter (Signed)
Forms completed and placed in outgoing.

## 2016-12-10 NOTE — Telephone Encounter (Signed)
Spoke with patient and she states that she will be coming by to pick up the forms today at lunch time. Reviewed with her that we typically send them through [employer] but she insisted that she was the one who brought them in and would like to pick them up. Will alert front staff of her request.

## 2016-12-10 NOTE — Telephone Encounter (Signed)
Received call from [place] at [place], requesting completed paperwork faxed to her. Faxed completed forms.

## 2016-12-10 NOTE — Telephone Encounter (Signed)
[employer] forms completed and placed in outgoing box.

## 2016-12-11 NOTE — Telephone Encounter (Signed)
Faxed completed forms per Glenard Haring at [facility] request. Also sent interoffice

## 2016-12-20 ENCOUNTER — Other Ambulatory Visit: Payer: Self-pay | Admitting: Internal Medicine

## 2016-12-31 ENCOUNTER — Encounter: Payer: BLUE CROSS/BLUE SHIELD | Admitting: Internal Medicine

## 2017-01-05 ENCOUNTER — Ambulatory Visit (INDEPENDENT_AMBULATORY_CARE_PROVIDER_SITE_OTHER): Payer: BLUE CROSS/BLUE SHIELD

## 2017-01-05 DIAGNOSIS — I5032 Chronic diastolic (congestive) heart failure: Secondary | ICD-10-CM

## 2017-01-05 DIAGNOSIS — Z95810 Presence of automatic (implantable) cardiac defibrillator: Secondary | ICD-10-CM | POA: Diagnosis not present

## 2017-01-06 NOTE — Progress Notes (Signed)
EPIC Encounter for ICM Monitoring  Patient Name: Suzanne Spears is a 56 y.o. female Date: 01/06/2017 Primary Care Physican: [name], MD Primary Cardiologist: Rockey Situ Electrophysiologist: Caryl Comes Dry Weight:173lbs  Bi-V Pacing: 98.2%      Heart Failure questions reviewed, pt asymptomatic. Patient reported Dr Caryl Comes wanted her to have an echo 6 months from last office visit.  Advised Dr Olin Pia last note in June advises to repeat echo in a year.  She would like for me to check with Dr Caryl Comes if she needs an echo before her 12/4 office appointment.  Advised will discuss with Dr Caryl Comes and call her back.    Thoracic impedance normal.  Prescribed dosage: Furosemide 20 mg 1 table as needed. Potassium 10 mEq 1 tablet daily  Labs: 11/27/2016 Creatinine 1.08, BUN 17, Potassium 4.0, Sodium 138, EGFR 57->60 03/31/2016 Creatinine 1.18, BUN 15, Potassium 3.4, Sodium 137, EGFR 51-59  Recommendations: No changes.   Encouraged to call for fluid symptoms.    Follow-up plan: ICM clinic phone appointment on 03/02/2017 and defib office check with Dr Caryl Comes on 01/27/2017.    Copy of ICM check sent to Dr. Caryl Comes.   3 month ICM trend: 01/05/2017    1 Year ICM trend:       [name], RN 01/06/2017 2:47 PM

## 2017-01-08 ENCOUNTER — Other Ambulatory Visit: Payer: Self-pay | Admitting: *Deleted

## 2017-01-08 NOTE — Progress Notes (Signed)
Call back to patient and advised that Dr Caryl Comes said a 6 month echo could be scheduled prior to the 01/27/2017 visit.  She said she thinks she misunderstood him and realized he mean't for to follow up with him in 6 months so she will wait to schedule echo.  Advised to discuss with him on 01/27/2017 and echo can be scheduled after the appointment if necessary.

## 2017-01-21 ENCOUNTER — Other Ambulatory Visit: Payer: Self-pay | Admitting: Internal Medicine

## 2017-01-21 MED ORDER — ALBUTEROL SULFATE HFA 108 (90 BASE) MCG/ACT IN AERS: INHALATION_SPRAY | RESPIRATORY_TRACT | 0 refills | Status: DC

## 2017-01-21 NOTE — Telephone Encounter (Signed)
Rx awaiting MD signature.

## 2017-01-22 ENCOUNTER — Ambulatory Visit: Payer: BLUE CROSS/BLUE SHIELD | Admitting: Internal Medicine

## 2017-01-22 ENCOUNTER — Encounter: Payer: Self-pay | Admitting: Internal Medicine

## 2017-01-22 VITALS — BP 110/80 | HR 91 | Ht 66.0 in

## 2017-01-22 DIAGNOSIS — J4520 Mild intermittent asthma, uncomplicated: Secondary | ICD-10-CM

## 2017-01-22 MED ORDER — FLUTICASONE-SALMETEROL 250-50 MCG/DOSE IN AEPB: 1.0000 | INHALATION_SPRAY | Freq: Two times a day (BID) | RESPIRATORY_TRACT | 12 refills | Status: DC

## 2017-01-22 MED ORDER — ALBUTEROL SULFATE HFA 108 (90 BASE) MCG/ACT IN AERS: INHALATION_SPRAY | RESPIRATORY_TRACT | 3 refills | Status: DC

## 2017-01-22 MED ORDER — PANTOPRAZOLE SODIUM 40 MG PO TBEC: 40.0000 mg | DELAYED_RELEASE_TABLET | Freq: Every day | ORAL | 1 refills | Status: DC

## 2017-01-22 NOTE — Progress Notes (Signed)
[facility] Consultation      MRN# [id number] Suzanne Spears May 10, 1960   CC: Follow up ASTHMA    Brief History: 56 year old female seen in consultation as transition of care from Dr. Raul Spears office for mild to moderate asthma, previously saw Suzanne Spears, then transferred to [facility], now back to [facility]. Medical history significant for left bundle branch block, mitral prolapse, dilated cardiomyopathy, asthma, hypertension, chronic systolic congestive heart failure, status post ICD placement, hx of pneumonia, which are all stable. Work allergens may aggravate cough, follow with ENT for nasal septum issues.    Events since last clinic visit: follow-up visit of mild intermittent asthma. On  Arnuity, and since then has had significant improvement in her cough and overall breathing. Has been using albuterol more frequently Also has untreated GERD symptoms Overall with significant improvement since starting ICS. No fevers, no chills, no nausea, no vomiting, no diarrhea, no worsening dyspnea on exertion.  Has significant h/o Cardiomyopathy sees Dr. Caryl Spears No lower ext swelling No signs of infection at this time  Her allergic rhinitis does seem to be under control   She also has complaints of neck issues with bulging soft tissue whne she coughs Has had anterior neck surgery 4 years ago ENT evaluated patient, follow up with neuro surgery pending  Medication:    Current Outpatient Medications:  .  albuterol (PROVENTIL) (2.5 MG/3ML) 0.083% nebulizer solution, Take 3 mLs (2.5 mg total) by nebulization every 6 (six) hours as needed for wheezing or shortness of breath., Disp: 150 mL, Rfl: 1 .  albuterol (VENTOLIN HFA) 108 (90 Base) MCG/ACT inhaler, INHALE 2 PUFFS BY MOUTH INTO THE LUNGS EVERY 4 HOURS AS NEEDED FOR WHEEZING AS DIRECTED BY PHYSICIAN.  Needs Appointment before future fills., Disp: 18 g, Rfl: 0 .  ARNUITY ELLIPTA 100 MCG/ACT AEPB, INHALE 1 PUFF BY MOUTH INTO THE  LUNGS DAILY AS DIRECTED BY PHYSICIAN., Disp: 30 each, Rfl: 4 .  Azelastine-Fluticasone 137-50 MCG/ACT SUSP, Place 1 spray into the nose 2 (two) times daily., Disp: 23 g, Rfl: 5 .  baclofen (LIORESAL) 10 MG tablet, Take 10 mg by mouth 2 (two) times daily. , Disp: , Rfl: 0 .  butalbital-acetaminophen-caffeine (FIORICET, ESGIC) 50-325-40 MG tablet, Take 1-2 tablets by mouth every 6 (six) hours as needed for headache., Disp: 20 tablet, Rfl: 0 .  cetirizine (ZYRTEC) 10 MG chewable tablet, Chew 10 mg by mouth daily., Disp: , Rfl:  .  chlorpheniramine-HYDROcodone (TUSSIONEX PENNKINETIC ER) 10-8 MG/5ML SUER, Take 5 mLs by mouth at bedtime as needed for cough., Disp: 140 mL, Rfl: 0 .  citalopram (CELEXA) 20 MG tablet, TAKE (1) TABLET BY MOUTH EVERY DAY, Disp: 30 tablet, Rfl: 3 .  cyanocobalamin (,VITAMIN B-12,) 1000 MCG/ML injection, INJECT 1 ML INTO THE MUSCLE ONCE A WEEK, Disp: 4 mL, Rfl: 3 .  DULoxetine (CYMBALTA) 60 MG capsule, Take 1 capsule (60 mg total) by mouth daily., Disp: 30 capsule, Rfl: 2 .  etodolac (LODINE) 500 MG tablet, Take 1 tablet (500 mg total) by mouth 2 (two) times daily., Disp: 60 tablet, Rfl: 3 .  fluticasone (FLONASE) 50 MCG/ACT nasal spray, USE 2 SPRAYS INTO BOTH NOSTRILS ONCE DAILY AS DIRECTED BY PHYSICIAN., Disp: 48 g, Rfl: 2 .  Fluticasone Furoate (ARNUITY ELLIPTA) 100 MCG/ACT AEPB, Inhale 1 puff into the lungs daily., Disp: 30 each, Rfl: 5 .  furosemide (LASIX) 20 MG tablet, Take 20 mg by mouth daily as needed., Disp: , Rfl: 10 .  gabapentin (NEURONTIN) 100  MG capsule, TAKE ONE (1) CAPSULE THREE (3) TIMES EACH DAY, Disp: 90 capsule, Rfl: 1 .  HYDROcodone-acetaminophen (NORCO/VICODIN) 5-325 MG tablet, Take 1 tablet by mouth every 8 (eight) hours as needed for moderate pain., Disp: , Rfl:  .  JUNEL FE 1/20 1-20 MG-MCG tablet, TAKE ONE (1) TABLET BY MOUTH ONCE DAILY, Disp: 28 tablet, Rfl: 3 .  losartan (COZAAR) 50 MG tablet, TAKE (1) TABLET BY MOUTH EVERY DAY, Disp: 30 tablet, Rfl:  3 .  magnesium 30 MG tablet, Take 30 mg by mouth daily., Disp: , Rfl:  .  montelukast (SINGULAIR) 10 MG tablet, TAKE ONE (1) TABLET BY MOUTH ONCE DAILY, Disp: 30 tablet, Rfl: 3 .  potassium chloride (K-DUR) 10 MEQ tablet, Take 1 tablet (10 mEq total) by mouth daily., Disp: 90 tablet, Rfl: 3 .  traMADol (ULTRAM) 50 MG tablet, Take 1 tablet (50 mg total) by mouth every 6 (six) hours as needed., Disp: 60 tablet, Rfl: 2 .  traZODone (DESYREL) 50 MG tablet, TAKE 1/2 TO 1 TABLET BY MOUTH AT BEDTIMEAS NEEDED FOR SLEEP, Disp: 30 tablet, Rfl: 1 .  TROKENDI XR 100 MG CP24, 100 mg daily. , Disp: , Rfl: 4 .  valACYclovir (VALTREX) 1000 MG tablet, Take 1 tablet (1,000 mg total) by mouth 2 (two) times daily as needed (fever blisters)., Disp: 20 tablet, Rfl: 3    Review of Systems  Constitutional: Negative for chills and fever.  HENT: Positive for congestion.   Eyes: Negative for blurred vision and double vision.  Respiratory: Positive for cough.        Mild intermittent cough  Cardiovascular: Negative for chest pain.  Gastrointestinal: Negative for heartburn and nausea.  Genitourinary: Negative for dysuria.  Musculoskeletal: Negative for myalgias.  Neurological: Negative for dizziness and headaches.  Psychiatric/Behavioral: Nervous/anxious: .vs.       Allergies:  Adhesive [tape]; Coreg [carvedilol]; Metoprolol; Penicillin g; Prednisone; Latex; and Levofloxacin  Physical Examination:  Ht 5\' 6"  (1.676 m)   BMI 27.89 kg/m  BP 110/80 (BP Location: Left Arm, Cuff Size: Normal)   Pulse 91   Ht 5\' 6"  (1.676 m)   SpO2 99%   BMI 27.89 kg/m   General Appearance: No distress  HEENT: PERRLA, no ptosis, no other lesions noticed Pulmonary:normal breath sounds., diaphragmatic excursion normal.No wheezing, No rales   Cardiovascular:  Normal S1,S2.  No m/r/g.     Abdomen:Exam: Benign, Soft, non-tender, No masses  Skin:   warm, no rashes, no ecchymosis  Extremities: normal, no cyanosis, clubbing, warm  with normal capillary refill.      07/09/15 PFTs - FEV1 80, FEV1/FVC 72, FEF 25-70 562, DLCO 91%. No significant obstruction,recurrent bronchodilator response, DLCO uncorrected within normal limits, normal curves. 6 minute walk test-no significant desaturations, lowest desaturation 96%, 1181 feet, 360 m    Assessment and Plan:  56 year old seen in follow-up for asthma-mild intemittent, well controlled at this time Asthma, chronic The above was discussed with the patient, and she is in agreement with changing to advair 250/50 as she is using her albuterol more frequently .  Pfts are essentially normal, with moderate decrease in FEF 25-75 62% (FEV1 80%, FEV1/FVC 72%) Normal 27mwt - no desats, walked 1167ft/360m   she claims that she has allergies to prednisone, but her descriptions are more in line with side effects of high-dose steroids.  I have discussed with her in the future if she does need prednisone, we can start off at lower dose, she will consider this when that time  Spears.    Plan: -Advair 250/50  -gargle and rinse after each use.  -Avoid noxious substances such as smoke, perfumes, and other sick contacts. -Continue with outpatient allergy regiment.  Allerghic Rhinitis-not under control - zyrtec - flonase  Laryngeal Neck Issues-ENT referall to be made Will need to follow up with neuro surgery  GERD -start protonix  Follow up in 3 months  Patient satisfied with Plan of action and management. All questions answered  Corrin Parker, M.D.  [facility]  Medical Director [place] Director [facility] Cardio-P[facility] Department

## 2017-01-22 NOTE — Patient Instructions (Addendum)
Stop arnuity Start advair Albuterol as needed Start protonix

## 2017-01-26 ENCOUNTER — Other Ambulatory Visit: Payer: Self-pay | Admitting: Internal Medicine

## 2017-01-27 ENCOUNTER — Encounter: Payer: Self-pay | Admitting: Internal Medicine

## 2017-01-27 ENCOUNTER — Telehealth: Payer: Self-pay | Admitting: Internal Medicine

## 2017-01-27 ENCOUNTER — Ambulatory Visit: Payer: BLUE CROSS/BLUE SHIELD | Admitting: Internal Medicine

## 2017-01-27 VITALS — BP 122/70 | HR 80 | Ht 65.5 in | Wt 174.2 lb

## 2017-01-27 DIAGNOSIS — R5382 Chronic fatigue, unspecified: Secondary | ICD-10-CM

## 2017-01-27 DIAGNOSIS — I5032 Chronic diastolic (congestive) heart failure: Secondary | ICD-10-CM | POA: Diagnosis not present

## 2017-01-27 DIAGNOSIS — I447 Left bundle-branch block, unspecified: Secondary | ICD-10-CM

## 2017-01-27 NOTE — Telephone Encounter (Signed)
Patient is in office for [employer] appt She has a few questions regarding the home sleep study she is awaiting to get set up Please give her a call to discuss

## 2017-01-27 NOTE — Patient Instructions (Addendum)
Medication Instructions:  Your physician recommends that you continue on your current medications as directed. Please refer to the Current Medication list given to you today.   Labwork: none  Testing/Procedures: Your physician has requested that you have an echocardiogram. Echocardiography is a painless test that uses sound waves to create images of your heart. It provides your doctor with information about the size and shape of your heart and how well your heart's chambers and valves are working. This procedure takes approximately one hour. There are no restrictions for this procedure.    Follow-Up: Your physician wants you to follow-up in: 1 year with Dr. Caryl Comes.  You will receive a reminder letter in the mail two months in advance. If you don't receive a letter, please call our office to schedule the follow-up appointment.   Any Other Special Instructions Will Be Listed Below (If Applicable). Pulmonary appointment for home sleep study.      If you need a refill on your cardiac medications before your next appointment, please call your pharmacy.  Echocardiogram An echocardiogram, or echocardiography, uses sound waves (ultrasound) to produce an image of your heart. The echocardiogram is simple, painless, obtained within a short period of time, and offers valuable information to your health care provider. The images from an echocardiogram can provide information such as:  Evidence of coronary artery disease (CAD).  Heart size.  Heart muscle function.  Heart valve function.  Aneurysm detection.  Evidence of a past heart attack.  Fluid buildup around the heart.  Heart muscle thickening.  Assess heart valve function.  Tell a health care provider about:  Any allergies you have.  All medicines you are taking, including vitamins, herbs, eye drops, creams, and over-the-counter medicines.  Any problems you or family members have had with anesthetic medicines.  Any blood  disorders you have.  Any surgeries you have had.  Any medical conditions you have.  Whether you are pregnant or may be pregnant. What happens before the procedure? No special preparation is needed. Eat and drink normally. What happens during the procedure?  In order to produce an image of your heart, gel will be applied to your chest and a wand-like tool (transducer) will be moved over your chest. The gel will help transmit the sound waves from the transducer. The sound waves will harmlessly bounce off your heart to allow the heart images to be captured in real-time motion. These images will then be recorded.  You may need an IV to receive a medicine that improves the quality of the pictures. What happens after the procedure? You may return to your normal schedule including diet, activities, and medicines, unless your health care provider tells you otherwise. This information is not intended to replace advice given to you by your health care provider. Make sure you discuss any questions you have with your health care provider. Document Released: 02/08/2000 Document Revised: 09/29/2015 Document Reviewed: 10/18/2012 Elsevier Interactive Patient Education  2017 [employer].

## 2017-01-27 NOTE — Telephone Encounter (Signed)
Pt would need a consult visit with Pulmonary  so we can document her signs, symptoms and required information needed to obtain approval from pt's insurance. Rhonda J Cobb
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
## 2017-01-28 ENCOUNTER — Encounter: Payer: Self-pay | Admitting: Internal Medicine

## 2017-01-28 NOTE — Telephone Encounter (Signed)
Spoke with pt and informed her that we have never discussed any sleep issues with her at any of her visits. That we have only discussed her breathing issues. Informed pt that there are certain questions that we have to have documented in an OV note that we have to provide when getting a sleep study pre-certed. Informed pt that I understand that cardiology ask her about her sleep but they don't document the correct information and they can't set up home sleep test. Offered pt an appt for 01/29/17 but pt declined due to work schedule. Pt was offered an appt with [facility] on 03/03/17 @ 4:30pm which was accepted. Informed pt that once she checked her work schedule to call back to reschedule if this appt doesn't work. Pt verbalized understanding. Nothing further needed.

## 2017-01-28 NOTE — Telephone Encounter (Signed)
Before calling patient to schedule an appt please confirm she needs another ov prior to ordering home sleep test   She was seen in November 2018  Please advise

## 2017-01-29 ENCOUNTER — Telehealth: Payer: Self-pay | Admitting: Internal Medicine

## 2017-01-30 ENCOUNTER — Encounter: Payer: Self-pay | Admitting: Internal Medicine

## 2017-01-31 ENCOUNTER — Other Ambulatory Visit: Payer: Self-pay | Admitting: Internal Medicine

## 2017-01-31 DIAGNOSIS — R5382 Chronic fatigue, unspecified: Secondary | ICD-10-CM

## 2017-01-31 DIAGNOSIS — R0683 Snoring: Secondary | ICD-10-CM

## 2017-01-31 NOTE — Assessment & Plan Note (Signed)
Worsening, as is her snoring.  Repeat sleep study needed.

## 2017-01-31 NOTE — Assessment & Plan Note (Signed)
With excessive daytime fatigue and hypersomnolence  Sleep study needed.

## 2017-02-04 ENCOUNTER — Other Ambulatory Visit: Payer: BLUE CROSS/BLUE SHIELD

## 2017-02-04 ENCOUNTER — Telehealth: Payer: Self-pay | Admitting: Internal Medicine

## 2017-02-04 NOTE — Telephone Encounter (Signed)
Dr. Caryl Comes suggested home sleep study. Pt is an established patient with [facility]. OV would be needed before study could be ordered. Per MyChart, Dr. Derrel Nip is agreeable to order this. Left message on pt's home VM to contact the office if we may be of any other assistance.

## 2017-02-06 ENCOUNTER — Ambulatory Visit (INDEPENDENT_AMBULATORY_CARE_PROVIDER_SITE_OTHER): Payer: BLUE CROSS/BLUE SHIELD

## 2017-02-06 ENCOUNTER — Other Ambulatory Visit: Payer: Self-pay

## 2017-02-06 DIAGNOSIS — I447 Left bundle-branch block, unspecified: Secondary | ICD-10-CM | POA: Diagnosis not present

## 2017-02-06 DIAGNOSIS — I5032 Chronic diastolic (congestive) heart failure: Secondary | ICD-10-CM | POA: Diagnosis not present

## 2017-02-10 ENCOUNTER — Encounter: Payer: Self-pay | Admitting: Internal Medicine

## 2017-02-10 DIAGNOSIS — R1319 Other dysphagia: Secondary | ICD-10-CM | POA: Insufficient documentation

## 2017-02-12 ENCOUNTER — Encounter: Payer: Self-pay | Admitting: Internal Medicine

## 2017-02-19 ENCOUNTER — Other Ambulatory Visit: Payer: Self-pay | Admitting: Internal Medicine

## 2017-02-23 LAB — CUP PACEART INCLINIC DEVICE CHECK
Battery Remaining Longevity: 36 mo
Battery Voltage: 2.9 V
Brady Statistic AP VP Percent: 0.04 %
Brady Statistic AP VS Percent: 0.02 %
Brady Statistic AS VP Percent: 98.54 %
Brady Statistic AS VS Percent: 1.4 %
Brady Statistic RA Percent Paced: 0.06 %
Brady Statistic RV Percent Paced: 4.54 %
Date Time Interrogation Session: 20181204174237
HighPow Impedance: 87 Ohm
Implantable Lead Implant Date: 20160421
Implantable Lead Implant Date: 20160421
Implantable Lead Implant Date: 20161006
Implantable Lead Implant Date: 20161006
Implantable Lead Location: 753858
Implantable Lead Location: 753858
Implantable Lead Location: 753859
Implantable Lead Location: 753860
Implantable Lead Model: 5071
Implantable Lead Model: 5071
Implantable Lead Model: 5076
Implantable Pulse Generator Implant Date: 20160421
Lead Channel Impedance Value: 323 Ohm
Lead Channel Impedance Value: 342 Ohm
Lead Channel Impedance Value: 380 Ohm
Lead Channel Impedance Value: 399 Ohm
Lead Channel Impedance Value: 4047 Ohm
Lead Channel Impedance Value: 4047 Ohm
Lead Channel Pacing Threshold Amplitude: 0.5 V
Lead Channel Pacing Threshold Amplitude: 0.5 V
Lead Channel Pacing Threshold Amplitude: 1 V
Lead Channel Pacing Threshold Pulse Width: 0.4 ms
Lead Channel Pacing Threshold Pulse Width: 0.4 ms
Lead Channel Pacing Threshold Pulse Width: 0.4 ms
Lead Channel Sensing Intrinsic Amplitude: 2.75 mV
Lead Channel Sensing Intrinsic Amplitude: 31.625 mV
Lead Channel Setting Pacing Amplitude: 1.5 V
Lead Channel Setting Pacing Amplitude: 2 V
Lead Channel Setting Pacing Amplitude: 2.5 V
Lead Channel Setting Pacing Pulse Width: 0.4 ms
Lead Channel Setting Pacing Pulse Width: 0.4 ms
Lead Channel Setting Sensing Sensitivity: 0.3 mV

## 2017-02-23 NOTE — Telephone Encounter (Signed)
Error. Please disregard

## 2017-02-25 ENCOUNTER — Ambulatory Visit: Payer: Self-pay

## 2017-02-25 NOTE — Telephone Encounter (Signed)
   Reason for Disposition . [1] MODERATE headache (e.g., interferes with normal activities) AND [2] present > 24 hours AND [3] unexplained  (Exceptions: analgesics not tried, typical migraine, or headache part of viral illness)  Answer Assessment - Initial Assessment Questions 1. LOCATION: "Where does it hurt?"      Hurts at the back of head 2. ONSET: "When did the headache start?" (Minutes, hours or days)      Monday 3. PATTERN: "Does the pain come and go, or has it been constant since it started?"     Comes and goes, but is getting worse 4. SEVERITY: "How bad is the pain?" and "What does it keep you from doing?"  (e.g., Scale 1-10; mild, moderate, or severe)   - MILD (1-3): doesn't interfere with normal activities    - MODERATE (4-7): interferes with normal activities or awakens from sleep    - SEVERE (8-10): excruciating pain, unable to do any normal activities        6 5. RECURRENT SYMPTOM: "Have you ever had headaches before?" If so, ask: "When was the last time?" and "What happened that time?"      Yes - migraines 6. CAUSE: "What do you think is causing the headache?"     Maybe a migraine 7. MIGRAINE: "Have you been diagnosed with migraine headaches?" If so, ask: "Is this headache similar?"      Yes 8. HEAD INJURY: "Has there been any recent injury to the head?"      No 9. OTHER SYMPTOMS: "Do you have any other symptoms?" (fever, stiff neck, eye pain, sore throat, cold symptoms)     Maybe sinus 10. PREGNANCY: "Is there any chance you are pregnant?" "When was your last menstrual period?"       No  Protocols used: HEADACHE-A-AH  Appointment for 02/26/17.

## 2017-02-26 ENCOUNTER — Ambulatory Visit: Payer: BLUE CROSS/BLUE SHIELD | Admitting: Internal Medicine

## 2017-02-26 ENCOUNTER — Encounter: Payer: Self-pay | Admitting: Internal Medicine

## 2017-02-27 NOTE — Telephone Encounter (Signed)
Can you enquire / find out if a peer to peer can be done to appeal the insurance denial for facility sleep study ?  Thanks  TT

## 2017-03-02 ENCOUNTER — Ambulatory Visit (INDEPENDENT_AMBULATORY_CARE_PROVIDER_SITE_OTHER): Payer: BLUE CROSS/BLUE SHIELD | Admitting: *Deleted

## 2017-03-02 DIAGNOSIS — I5032 Chronic diastolic (congestive) heart failure: Secondary | ICD-10-CM

## 2017-03-02 DIAGNOSIS — Z95810 Presence of automatic (implantable) cardiac defibrillator: Secondary | ICD-10-CM

## 2017-03-02 DIAGNOSIS — I428 Other cardiomyopathies: Secondary | ICD-10-CM

## 2017-03-02 NOTE — Progress Notes (Signed)
EPIC Encounter for ICM Monitoring  Patient Name: Suzanne Spears is a 56 y.o. female Date: 03/02/2017 Primary Care Physican: Tullo, Teresa L, MD Primary Cardiologist: Gollan Electrophysiologist: Klein Dry Weight:171.6lbs  Bi-V Pacing: 98.3%       Heart Failure questions reviewed, pt symptomatic with 3 lb weight gain but is taking PRN Furosemide for a few days.   She has had migraine headache since 1/2 and received Tordol shot which has helps a little.     Thoracic impedance slightly abnormal suggesting fluid accumulation.  Prescribed dosage: Furosemide 20 mg 1 table as needed. Potassium 10 mEq 1 tablet daily  Labs: 11/27/2016 Creatinine 1.08, BUN 17, Potassium 4.0, Sodium 138, EGFR 57->60 03/31/2016 Creatinine 1.18, BUN 15, Potassium 3.4, Sodium 137, EGFR 51-59  Recommendations:  She is taking PRN Furosemide for a few days.  Follow-up plan: ICM clinic phone appointment on 04/02/2017.    Copy of ICM check sent to Dr. Klein.   3 month ICM trend: 03/02/2017    1 Year ICM trend:       Laurie S Short, RN 03/02/2017 2:13 PM   

## 2017-03-03 ENCOUNTER — Other Ambulatory Visit: Payer: Self-pay | Admitting: Internal Medicine

## 2017-03-03 ENCOUNTER — Encounter: Payer: Self-pay | Admitting: Cardiology

## 2017-03-03 ENCOUNTER — Ambulatory Visit: Payer: BLUE CROSS/BLUE SHIELD | Admitting: Internal Medicine

## 2017-03-03 NOTE — Progress Notes (Signed)
Remote ICD transmission.   

## 2017-03-04 ENCOUNTER — Telehealth: Payer: Self-pay | Admitting: Internal Medicine

## 2017-03-04 NOTE — Telephone Encounter (Signed)
Spoke with Suzanne Spears and informed her that the sleep study test had been denied by her insurance and Dr. Derrel Nip would like her to talk with Dr. Mortimer Fries about these issues. The Suzanne Spears stated that she has an appt coming up and will discuss it with him at that her next appt.

## 2017-03-04 NOTE — Telephone Encounter (Signed)
Patient's insurance denied coverage for the facility sleep study .  In my experience the in home sleep studies are often not honored when they are used to order CPAP.  Pulmonology seems to have a streamlined way of diagnosing and managing sleep apnea,  So I would like her to return to Dr  Mortimer Fries to have the sleep apnea issues diagnosed.

## 2017-03-06 ENCOUNTER — Other Ambulatory Visit: Payer: Self-pay | Admitting: Internal Medicine

## 2017-03-07 LAB — CUP PACEART REMOTE DEVICE CHECK
Battery Remaining Longevity: 36 mo
Battery Voltage: 2.96 V
Brady Statistic AP VP Percent: 0.03 %
Brady Statistic AP VS Percent: 0.01 %
Brady Statistic AS VP Percent: 98.53 %
Brady Statistic AS VS Percent: 1.42 %
Brady Statistic RA Percent Paced: 0.05 %
Brady Statistic RV Percent Paced: 6.54 %
Date Time Interrogation Session: 20190107093622
HighPow Impedance: 84 Ohm
Implantable Lead Implant Date: 20160421
Implantable Lead Implant Date: 20160421
Implantable Lead Implant Date: 20161006
Implantable Lead Implant Date: 20161006
Implantable Lead Location: 753858
Implantable Lead Location: 753858
Implantable Lead Location: 753859
Implantable Lead Location: 753860
Implantable Lead Model: 5071
Implantable Lead Model: 5071
Implantable Lead Model: 5076
Implantable Pulse Generator Implant Date: 20160421
Lead Channel Impedance Value: 285 Ohm
Lead Channel Impedance Value: 323 Ohm
Lead Channel Impedance Value: 380 Ohm
Lead Channel Impedance Value: 380 Ohm
Lead Channel Impedance Value: 4047 Ohm
Lead Channel Impedance Value: 4047 Ohm
Lead Channel Pacing Threshold Amplitude: 0.5 V
Lead Channel Pacing Threshold Amplitude: 1 V
Lead Channel Pacing Threshold Pulse Width: 0.4 ms
Lead Channel Pacing Threshold Pulse Width: 0.4 ms
Lead Channel Sensing Intrinsic Amplitude: 2.125 mV
Lead Channel Sensing Intrinsic Amplitude: 2.125 mV
Lead Channel Sensing Intrinsic Amplitude: 31.375 mV
Lead Channel Sensing Intrinsic Amplitude: 31.375 mV
Lead Channel Setting Pacing Amplitude: 1.5 V
Lead Channel Setting Pacing Amplitude: 2 V
Lead Channel Setting Pacing Amplitude: 2.5 V
Lead Channel Setting Pacing Pulse Width: 0.4 ms
Lead Channel Setting Pacing Pulse Width: 0.4 ms
Lead Channel Setting Sensing Sensitivity: 0.3 mV

## 2017-03-26 ENCOUNTER — Other Ambulatory Visit: Payer: Self-pay | Admitting: Internal Medicine

## 2017-03-31 ENCOUNTER — Ambulatory Visit: Payer: BLUE CROSS/BLUE SHIELD | Admitting: Internal Medicine

## 2017-03-31 ENCOUNTER — Encounter: Payer: Self-pay | Admitting: Internal Medicine

## 2017-03-31 VITALS — BP 98/68 | HR 96 | Temp 98.7°F | Ht 65.5 in | Wt 171.0 lb

## 2017-03-31 DIAGNOSIS — K219 Gastro-esophageal reflux disease without esophagitis: Secondary | ICD-10-CM

## 2017-03-31 DIAGNOSIS — R42 Dizziness and giddiness: Secondary | ICD-10-CM | POA: Diagnosis not present

## 2017-03-31 DIAGNOSIS — Z1329 Encounter for screening for other suspected endocrine disorder: Secondary | ICD-10-CM

## 2017-03-31 DIAGNOSIS — R1319 Other dysphagia: Secondary | ICD-10-CM

## 2017-03-31 DIAGNOSIS — R1310 Dysphagia, unspecified: Secondary | ICD-10-CM | POA: Diagnosis not present

## 2017-03-31 DIAGNOSIS — I5022 Chronic systolic (congestive) heart failure: Secondary | ICD-10-CM | POA: Diagnosis not present

## 2017-03-31 DIAGNOSIS — H6982 Other specified disorders of Eustachian tube, left ear: Secondary | ICD-10-CM | POA: Diagnosis not present

## 2017-03-31 LAB — COMPREHENSIVE METABOLIC PANEL
ALT: 12 U/L (ref 0–35)
AST: 17 U/L (ref 0–37)
Albumin: 4 g/dL (ref 3.5–5.2)
Alkaline Phosphatase: 75 U/L (ref 39–117)
BUN: 25 mg/dL — ABNORMAL HIGH (ref 6–23)
CO2: 24 mEq/L (ref 19–32)
Calcium: 8.8 mg/dL (ref 8.4–10.5)
Chloride: 106 mEq/L (ref 96–112)
Creatinine, Ser: 1.37 mg/dL — ABNORMAL HIGH (ref 0.40–1.20)
GFR: 42.41 mL/min — ABNORMAL LOW (ref >60.00)
Glucose, Bld: 100 mg/dL — ABNORMAL HIGH (ref 70–99)
Potassium: 3.8 mEq/L (ref 3.5–5.1)
Sodium: 138 mEq/L (ref 135–145)
Total Bilirubin: 0.5 mg/dL (ref 0.2–1.2)
Total Protein: 6.8 g/dL (ref 6.0–8.3)

## 2017-03-31 LAB — T4, FREE: Free T4: 0.71 ng/dL (ref 0.60–1.60)

## 2017-03-31 LAB — TSH: TSH: 1.51 u[IU]/mL (ref 0.35–4.50)

## 2017-03-31 NOTE — Progress Notes (Signed)
Pre visit review using our clinic review tool, if applicable. No additional management support is needed unless otherwise documented below in the visit note. 

## 2017-03-31 NOTE — Patient Instructions (Signed)
F/u with PCP for comprehensive physical  Refer to Dr. Tiffany Kocher for trouble swallowing likely will need EGD/upper endoscopy  Will refer for CT neck   Dysphagia Dysphagia is trouble swallowing. This condition occurs when solids and liquids stick in a person's throat on the way down to the stomach, or when food takes longer to get to the stomach. You may have problems swallowing food, liquids, or both. You may also have pain while trying to swallow. It may take you more time and effort to swallow something. What are the causes? This condition is caused by:  Problems with the muscles. They may make it difficult for you to move food and liquids through the tube that connects your mouth to your stomach (esophagus). You may have ulcers, scar tissue, or inflammation that blocks the normal passage of food and liquids. Causes of these problems include: ? Acid reflux from your stomach into your esophagus (gastroesophageal reflux). ? Infections. ? Radiation treatment for cancer. ? Medicines taken without enough fluids to wash them down into your stomach.  Nerve problems. These prevent signals from being sent to the muscles of your esophagus to squeeze (contract) and move what you swallow down to your stomach.  Globus pharyngeus. This is a common problem that involves feeling like something is stuck in the throat or a sense of trouble with swallowing even though nothing is wrong with the swallowing passages.  Stroke. This can affect the nerves and make it difficult to swallow.  Certain conditions, such as cerebral palsy or Parkinson disease.  What are the signs or symptoms? Common symptoms of this condition include:  A feeling that solids or liquids are stuck in your throat on the way down to the stomach.  Food taking too long to get to the stomach.  Other symptoms include:  Food moving back from your stomach to your mouth (regurgitation).  Noises coming from your throat.  Chest discomfort with  swallowing.  A feeling of fullness when swallowing.  Drooling, especially when the throat is blocked.  Pain while swallowing.  Heartburn.  Coughing or gagging while trying to swallow.  How is this diagnosed? This condition is diagnosed by:  Barium X-ray. In this test, you swallow a white substance (contrast medium)that sticks to the inside of your esophagus. X-ray images are then taken.  Endoscopy. In this test, a flexible telescope is inserted down your throat to look at your esophagus and your stomach.  CT scans and MRI.  How is this treated? Treatment for dysphagia depends on the cause of the condition:  If the dysphagia is caused by acid reflux or infection, medicines may be used. They may include antibiotics and heartburn medicines.  If the dysphagia is caused by problems with your muscles, swallowing therapy may be used to help you strengthen your swallowing muscles. You may have to do specific exercises to strengthen the muscles or stretch them.  If the dysphagia is caused by a blockage or mass, procedures to remove the blockage may be done. You may need surgery and a feeding tube.  You may need to make diet changes. Ask your health care provider for specific instructions. Follow these instructions at home: Eating and drinking  Try to eat soft food that is easier to swallow.  Follow any diet changes as told by your health care provider.  Cut your food into small pieces and eat slowly.  Eat and drink only when you are sitting upright.  Do not drink alcohol or caffeine. If you  need help quitting, ask your health care provider. General instructions  Check your weight every day to make sure you are not losing weight.  Take over-the-counter and prescription medicines only as told by your health care provider.  If you were prescribed an antibiotic medicine, take it as told by your health care provider. Do not stop taking the antibiotic even if you start to feel  better.  Do not use any products that contain nicotine or tobacco, such as cigarettes and e-cigarettes. If you need help quitting, ask your health care provider.  Keep all follow-up visits as told by your health care provider. This is important. Contact a health care provider if:  You lose weight because you cannot swallow.  You cough when you drink liquids (aspiration).  You cough up partially digested food. Get help right away if:  You cannot swallow your saliva.  You have shortness of breath or a fever, or both.  You have a hoarse voice and also have trouble swallowing. Summary  Dysphagia is trouble swallowing. This condition occurs when solids and liquids stick in a person's throat on the way down to the stomach, or when food takes longer to get to the stomach.  Dysphagia has many possible causes and symptoms.  Treatment for dysphagia depends on the cause of the condition. This information is not intended to replace advice given to you by your health care provider. Make sure you discuss any questions you have with your health care provider. Document Released: 02/08/2000 Document Revised: 01/31/2016 Document Reviewed: 01/31/2016 Elsevier Interactive Patient Education  2017 [employer].

## 2017-04-02 ENCOUNTER — Ambulatory Visit (INDEPENDENT_AMBULATORY_CARE_PROVIDER_SITE_OTHER): Payer: BLUE CROSS/BLUE SHIELD

## 2017-04-02 DIAGNOSIS — Z95810 Presence of automatic (implantable) cardiac defibrillator: Secondary | ICD-10-CM | POA: Diagnosis not present

## 2017-04-02 DIAGNOSIS — I5032 Chronic diastolic (congestive) heart failure: Secondary | ICD-10-CM
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
## 2017-04-02 NOTE — Progress Notes (Signed)
EPIC Encounter for ICM Monitoring  Patient Name: Suzanne Spears is a 56 y.o. female Date: 04/02/2017 Primary Care Physican: Tullo, Teresa L, MD Primary Cardiologist: Gollan Electrophysiologist: Klein Dry Weight:171lbs  Bi-V Pacing: 98.4%           Heart Failure questions reviewed, pt asymptomatic.     Thoracic impedance normal but is above baseline suggesting dryness.  Advised impedance has shown a lot of days in the past month that suggest dryness. She has not been drinking as much and had a stomach virus in the last month.   Prescribed dosage: Furosemide 20 mg 1 table as needed. Potassium 10 mEq 1 tablet daily  Labs: 03/31/2017 Creatinine 1.37, BUN 25, Potassium 3.8, Sodium 138, EGFR 42.41 11/27/2016 Creatinine 1.08, BUN 17, Potassium 4.0, Sodium 138, EGFR 57->60 03/31/2016 Creatinine 1.18, BUN 15, Potassium 3.4, Sodium 137, EGFR 51-59  Recommendations: No changes.  Patient reported she was supposed to have a CT scan with dye but since her kidney function was not good per labs on 2/5, the physician has opted for different testing.  Advised to increase fluid intake by a couple of glasses for the next 2 days and then limit intake can be approximately 64 oz a day.   Follow-up plan: ICM clinic phone appointment on 05/04/2017.    Copy of ICM check sent to Dr. Klein.   3 month ICM trend: 04/02/2017    1 Year ICM trend:       Laurie S Short, RN 04/02/2017 8:06 AM   

## 2017-04-06 ENCOUNTER — Telehealth: Payer: Self-pay | Admitting: Internal Medicine

## 2017-04-06 ENCOUNTER — Other Ambulatory Visit: Payer: Self-pay | Admitting: Internal Medicine

## 2017-04-06 ENCOUNTER — Encounter: Payer: Self-pay | Admitting: Student

## 2017-04-06 NOTE — Telephone Encounter (Signed)
Dr Aundra Dubin pt is scheduled to get the endoscopy done tomorrow do you still want pt to get the DG Barium swallow done? Please advise? Thank you!

## 2017-04-06 NOTE — Telephone Encounter (Signed)
We can hold until after EGD but if EGD normal consider in future  Do not cancel order yet   Thanks [place]

## 2017-04-07 ENCOUNTER — Ambulatory Visit: Payer: BLUE CROSS/BLUE SHIELD | Admitting: Anesthesiology

## 2017-04-07 ENCOUNTER — Encounter: Payer: Self-pay | Admitting: Certified Registered Nurse Anesthetist

## 2017-04-07 ENCOUNTER — Ambulatory Visit
Admission: RE | Admit: 2017-04-07 | Discharge: 2017-04-07 | Disposition: A | Discharge status: Home / Self Care | Payer: BLUE CROSS/BLUE SHIELD | Source: Ambulatory Visit | Attending: Unknown Physician Specialty | Admitting: Unknown Physician Specialty

## 2017-04-07 ENCOUNTER — Encounter
Admission: RE | Disposition: A | Discharge status: Home / Self Care | Payer: Self-pay | Source: Ambulatory Visit | Attending: Unknown Physician Specialty

## 2017-04-07 DIAGNOSIS — I428 Other cardiomyopathies: Secondary | ICD-10-CM | POA: Diagnosis not present

## 2017-04-07 DIAGNOSIS — K449 Diaphragmatic hernia without obstruction or gangrene: Secondary | ICD-10-CM | POA: Insufficient documentation

## 2017-04-07 DIAGNOSIS — I340 Nonrheumatic mitral (valve) insufficiency: Secondary | ICD-10-CM | POA: Diagnosis not present

## 2017-04-07 DIAGNOSIS — I341 Nonrheumatic mitral (valve) prolapse: Secondary | ICD-10-CM | POA: Diagnosis not present

## 2017-04-07 DIAGNOSIS — E785 Hyperlipidemia, unspecified: Secondary | ICD-10-CM | POA: Diagnosis not present

## 2017-04-07 DIAGNOSIS — F329 Major depressive disorder, single episode, unspecified: Secondary | ICD-10-CM | POA: Diagnosis not present

## 2017-04-07 DIAGNOSIS — J45909 Unspecified asthma, uncomplicated: Secondary | ICD-10-CM | POA: Insufficient documentation

## 2017-04-07 DIAGNOSIS — Z7951 Long term (current) use of inhaled steroids: Secondary | ICD-10-CM | POA: Insufficient documentation

## 2017-04-07 DIAGNOSIS — Z95810 Presence of automatic (implantable) cardiac defibrillator: Secondary | ICD-10-CM | POA: Insufficient documentation

## 2017-04-07 DIAGNOSIS — I5022 Chronic systolic (congestive) heart failure: Secondary | ICD-10-CM | POA: Diagnosis not present

## 2017-04-07 DIAGNOSIS — I2510 Atherosclerotic heart disease of native coronary artery without angina pectoris: Secondary | ICD-10-CM | POA: Diagnosis not present

## 2017-04-07 DIAGNOSIS — I447 Left bundle-branch block, unspecified: Secondary | ICD-10-CM | POA: Insufficient documentation

## 2017-04-07 DIAGNOSIS — Z79899 Other long term (current) drug therapy: Secondary | ICD-10-CM | POA: Diagnosis not present

## 2017-04-07 DIAGNOSIS — G43909 Migraine, unspecified, not intractable, without status migrainosus: Secondary | ICD-10-CM | POA: Diagnosis not present

## 2017-04-07 DIAGNOSIS — R1310 Dysphagia, unspecified: Secondary | ICD-10-CM | POA: Insufficient documentation

## 2017-04-07 DIAGNOSIS — D649 Anemia, unspecified: Secondary | ICD-10-CM | POA: Diagnosis not present

## 2017-04-07 DIAGNOSIS — G629 Polyneuropathy, unspecified: Secondary | ICD-10-CM | POA: Diagnosis not present

## 2017-04-07 DIAGNOSIS — K219 Gastro-esophageal reflux disease without esophagitis: Secondary | ICD-10-CM | POA: Insufficient documentation

## 2017-04-07 DIAGNOSIS — I110 Hypertensive heart disease with heart failure: Secondary | ICD-10-CM | POA: Diagnosis not present

## 2017-04-07 HISTORY — DX: Migraine, unspecified, not intractable, without status migrainosus: G43.909

## 2017-04-07 HISTORY — PX: ESOPHAGOGASTRODUODENOSCOPY (EGD) WITH PROPOFOL: SHX5813

## 2017-04-07 SURGERY — ESOPHAGOGASTRODUODENOSCOPY (EGD) WITH PROPOFOL
Anesthesia: General

## 2017-04-07 MED ORDER — LIDOCAINE HCL (CARDIAC) 20 MG/ML IV SOLN
INTRAVENOUS | Status: DC | PRN
Start: 2017-04-07 — End: 2017-04-07
  Administered 2017-04-07: 50 mg via INTRATRACHEAL

## 2017-04-07 MED ORDER — PROPOFOL 500 MG/50ML IV EMUL
INTRAVENOUS | Status: AC
  Filled 2017-04-07: qty 50

## 2017-04-07 MED ORDER — SODIUM CHLORIDE 0.9 % IV SOLN: INTRAVENOUS | Status: DC

## 2017-04-07 MED ORDER — PROPOFOL 10 MG/ML IV BOLUS
INTRAVENOUS | Status: DC | PRN
  Administered 2017-04-07: 200 mg via INTRAVENOUS

## 2017-04-07 MED ORDER — IPRATROPIUM-ALBUTEROL 0.5-2.5 (3) MG/3ML IN SOLN: 3.0000 mL | Freq: Four times a day (QID) | RESPIRATORY_TRACT | Status: DC

## 2017-04-07 MED ORDER — LIDOCAINE HCL (PF) 2 % IJ SOLN
INTRAMUSCULAR | Status: AC
  Filled 2017-04-07: qty 10

## 2017-04-07 MED ORDER — SODIUM CHLORIDE 0.9 % IV SOLN
INTRAVENOUS | Status: DC
  Administered 2017-04-07: 1000 mL via INTRAVENOUS

## 2017-04-07 MED ORDER — IPRATROPIUM-ALBUTEROL 0.5-2.5 (3) MG/3ML IN SOLN
RESPIRATORY_TRACT | Status: AC
  Administered 2017-04-07: 3 mL
  Filled 2017-04-07: qty 3
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
## 2017-04-07 NOTE — Anesthesia Postprocedure Evaluation (Signed)
Anesthesia Post Note  Patient: Suzanne Spears  Procedure(s) Performed: ESOPHAGOGASTRODUODENOSCOPY (EGD) WITH PROPOFOL (N/A )  Patient location during evaluation: Endoscopy Anesthesia Type: General Level of consciousness: awake and alert Pain management: pain level controlled Vital Signs Assessment: post-procedure vital signs reviewed and stable Respiratory status: spontaneous breathing, nonlabored ventilation, respiratory function stable and patient connected to nasal cannula oxygen Cardiovascular status: blood pressure returned to baseline and stable Postop Assessment: no apparent nausea or vomiting Anesthetic complications: no     Last Vitals:  Vitals:   04/07/17 0750 04/07/17 0820  BP: (!) 89/60 112/61  Pulse: 78 76  Resp: 16 17  Temp: (!) 36.1 C   SpO2: 100% 100%    Last Pain:  Vitals:   04/07/17 0750  TempSrc: Tympanic                 Precious Haws Amillya Chavira

## 2017-04-07 NOTE — Transfer of Care (Signed)
Immediate Anesthesia Transfer of Care Note  Patient: Suzanne Spears  Procedure(s) Performed: ESOPHAGOGASTRODUODENOSCOPY (EGD) WITH PROPOFOL (N/A )  Patient Location: PACU endoscopy suite  Anesthesia Type:MAC  Level of Consciousness: awake, alert , oriented and patient cooperative  Airway & Oxygen Therapy: Patient Spontanous Breathing and Patient connected to nasal cannula oxygen  Post-op Assessment: Report given to RN and Post -op Vital signs reviewed and stable  Post vital signs: Reviewed and stable  Last Vitals:  Vitals:   04/07/17 0705  BP: 100/67  Pulse: 78  Resp: 16  Temp: (!) 35.9 C  SpO2: 100%    Last Pain:  Vitals:   04/07/17 0705  TempSrc: Tympanic         Complications: No apparent anesthesia complications

## 2017-04-07 NOTE — Anesthesia Post-op Follow-up Note (Signed)
Anesthesia QCDR form completed.        

## 2017-04-07 NOTE — Op Note (Signed)
[facility] Gastroenterology Patient Name: Suzanne Spears Procedure Date: 04/07/2017 7:21 AM MRN: [id number] Account #: [id number] Date of Birth: 06-14-1960 Admit Type: Outpatient Age: 57 Room: [facility] ENDO ROOM 1 Gender: Female Note Status: Finalized Procedure:            Upper GI endoscopy Indications:          Dysphagia Providers:            Manya Silvas, MD Referring MD:         Deborra Medina, MD (Referring MD) Medicines:            Propofol per Anesthesia Complications:        No immediate complications. Procedure:            Pre-Anesthesia Assessment:                       - After reviewing the risks and benefits, the patient                        was deemed in satisfactory condition to undergo the                        procedure.                       After obtaining informed consent, the endoscope was                        passed under direct vision. Throughout the procedure,                        the patient's blood pressure, pulse, and oxygen                        saturations were monitored continuously. The Endoscope                        was introduced through the mouth, and advanced to the                        second part of duodenum. The upper GI endoscopy was                        accomplished without difficulty. The patient tolerated                        the procedure well. Findings:      Hypopharynx normal.      The examined esophagus was normal. GEJ 39-40cm. At the end of the       procedure I passed a guide wire down the scope into the distal stomach       and removed the scope and then passed a 48 F Savary dilator and a 85F       Savary dilator with no significant resistance.      A small hiatal hernia was present.      The stomach was normal.      The examined duodenum was normal. Impression:           - Normal esophagus.                       -  Small hiatal hernia.                       - Normal stomach.   - Normal examined duodenum.                       - No specimens collected. Recommendation:       - The findings and recommendations were discussed with                        the patient's family.                       - soft food for 3 days, eat slowly, chew well, take                        small bites Manya Silvas, MD 04/07/2017 7:51:19 AM This report has been signed electronically. Number of Addenda: 0 Note Initiated On: 04/07/2017 7:21 AM      [facility]

## 2017-04-08 ENCOUNTER — Encounter: Payer: Self-pay | Admitting: Unknown Physician Specialty

## 2017-04-09 ENCOUNTER — Telehealth: Payer: Self-pay

## 2017-04-09 ENCOUNTER — Ambulatory Visit: Payer: BLUE CROSS/BLUE SHIELD | Attending: Internal Medicine

## 2017-04-09 NOTE — Telephone Encounter (Signed)
Left message for patient to return call back. [facility] may inform and obtain information for Dr. Olivia Mackie.

## 2017-04-09 NOTE — Telephone Encounter (Signed)
-----   Message from Delorise Jackson, MD sent at 04/08/2017 12:26 PM EST ----- Call pt is she still dizzy/lightheaded ? If so with low blood pressure could reduce losartan to 25 mg daily  Would she be interested in doing that?  [place]

## 2017-04-16 ENCOUNTER — Encounter: Payer: Self-pay | Admitting: Internal Medicine

## 2017-04-16 ENCOUNTER — Ambulatory Visit: Payer: BLUE CROSS/BLUE SHIELD | Admitting: Internal Medicine

## 2017-04-16 ENCOUNTER — Telehealth: Payer: Self-pay | Admitting: Internal Medicine

## 2017-04-16 VITALS — BP 110/60 | HR 91 | Ht 65.5 in | Wt 168.0 lb

## 2017-04-16 DIAGNOSIS — G4719 Other hypersomnia: Secondary | ICD-10-CM

## 2017-04-16 DIAGNOSIS — J4520 Mild intermittent asthma, uncomplicated: Secondary | ICD-10-CM | POA: Diagnosis not present

## 2017-04-16 NOTE — Progress Notes (Signed)
[facility] Consultation      MRN# [id number] Suzanne Spears 03/13/1960   CC: Follow up ASTHMA    Brief History: 57 year old female seen in consultation as transition of care from Dr. Raul Del office for mild to moderate asthma, previously saw BQ, then transferred to [facility], now back to [facility]. Medical history significant for left bundle branch block, mitral prolapse, dilated cardiomyopathy, asthma, hypertension, chronic systolic congestive heart failure, status post ICD placement, hx of pneumonia, which are all stable. Work allergens may aggravate cough, follow with ENT for nasal septum issues.   Events since last clinic visit: follow-up visit of mild intermittent asthma. On  Arnuity, and since then has had significant improvement in her cough and overall breathing. Has been using albuterol more frequently after she had s/p esoph dilataion last week Denies SOB/wheezing Has increased chest discomfort with cough only, patient thinks that this is related to procedure done by GI  Also has untreated GERD symptoms-on PPI  Overall with significant improvement since starting ICS. No fevers, no chills, no nausea, no vomiting, no diarrhea, no worsening dyspnea on exertion.   Has significant h/o Cardiomyopathy sees Dr. Caryl Comes No lower ext swelling No signs of infection at this time  Her allergic rhinitis does seem to be under control   Patient  has been having sleep problems for over one year Patient has been having excessive daytime sleepiness Patient has been having extreme fatigue and tiredness, lack of energy +  very Loud snoring every night   Medication:    Current Outpatient Medications:  .  albuterol (PROVENTIL) (2.5 MG/3ML) 0.083% nebulizer solution, Take 3 mLs (2.5 mg total) by nebulization every 6 (six) hours as needed for wheezing or shortness of breath., Disp: 150 mL, Rfl: 1 .  albuterol (VENTOLIN HFA) 108 (90 Base) MCG/ACT inhaler, INHALE 2  PUFFS BY MOUTH INTO THE LUNGS EVERY 4 HOURS AS NEEDED FOR WHEEZING., Disp: 18 g, Rfl: 3 .  ARNUITY ELLIPTA 100 MCG/ACT AEPB, INHALE 1 PUFF BY MOUTH INTO THE LUNGS DAILY AS DIRECTED BY PHYSICIAN., Disp: 30 each, Rfl: 4 .  Azelastine-Fluticasone 137-50 MCG/ACT SUSP, Place 1 spray into the nose 2 (two) times daily., Disp: 23 g, Rfl: 5 .  baclofen (LIORESAL) 10 MG tablet, Take 10 mg by mouth 2 (two) times daily. , Disp: , Rfl: 0 .  butalbital-acetaminophen-caffeine (FIORICET, ESGIC) 50-325-40 MG tablet, Take 1-2 tablets by mouth every 6 (six) hours as needed for headache., Disp: 20 tablet, Rfl: 0 .  cetirizine (ZYRTEC) 10 MG chewable tablet, Chew 10 mg by mouth daily., Disp: , Rfl:  .  citalopram (CELEXA) 20 MG tablet, TAKE (1) TABLET BY MOUTH EVERY DAY, Disp: 30 tablet, Rfl: 3 .  cyanocobalamin (,VITAMIN B-12,) 1000 MCG/ML injection, INJECT 1 ML INTO THE MUSCLE ONCE A WEEK, Disp: 4 mL, Rfl: 3 .  Diclofenac Potassium (CAMBIA) 50 MG PACK, Take by mouth., Disp: , Rfl:  .  DULoxetine (CYMBALTA) 60 MG capsule, Take 1 capsule (60 mg total) by mouth daily., Disp: 30 capsule, Rfl: 2 .  etodolac (LODINE) 500 MG tablet, Take 1 tablet (500 mg total) by mouth 2 (two) times daily., Disp: 60 tablet, Rfl: 3 .  fluticasone (FLONASE) 50 MCG/ACT nasal spray, USE 2 SPRAYS INTO BOTH NOSTRILS ONCE DAILY AS DIRECTED BY PHYSICIAN., Disp: 48 g, Rfl: 2 .  Fluticasone Furoate (ARNUITY ELLIPTA) 100 MCG/ACT AEPB, Inhale 1 puff into the lungs daily., Disp: 30 each, Rfl: 5 .  Fluticasone-Salmeterol (ADVAIR DISKUS) 250-50 MCG/DOSE  AEPB, Inhale 1 puff into the lungs 2 (two) times daily., Disp: 1 each, Rfl: 12 .  furosemide (LASIX) 20 MG tablet, Take 20 mg by mouth daily as needed., Disp: , Rfl: 10 .  gabapentin (NEURONTIN) 100 MG capsule, TAKE ONE (1) CAPSULE THREE (3) TIMES EACH DAY, Disp: 90 capsule, Rfl: 1 .  HYDROcodone-acetaminophen (NORCO/VICODIN) 5-325 MG tablet, Take 1 tablet by mouth every 8 (eight) hours as needed for  moderate pain., Disp: , Rfl:  .  JUNEL FE 1/20 1-20 MG-MCG tablet, TAKE (1) TABLET BY MOUTH EVERY DAY, Disp: 90 tablet, Rfl: 1 .  losartan (COZAAR) 50 MG tablet, TAKE (1) TABLET BY MOUTH EVERY DAY, Disp: 90 tablet, Rfl: 1 .  magnesium 30 MG tablet, Take 30 mg by mouth daily., Disp: , Rfl:  .  magnesium gluconate (MAGONATE) 500 MG tablet, Take 500 mg by mouth 2 (two) times daily., Disp: , Rfl:  .  montelukast (SINGULAIR) 10 MG tablet, TAKE ONE (1) TABLET BY MOUTH ONCE DAILY, Disp: 30 tablet, Rfl: 3 .  pantoprazole (PROTONIX) 40 MG tablet, TAKE (1) TABLET BY MOUTH EVERY DAY, Disp: 30 tablet, Rfl: 1 .  potassium chloride (K-DUR) 10 MEQ tablet, Take 1 tablet (10 mEq total) by mouth daily., Disp: 90 tablet, Rfl: 3 .  traMADol (ULTRAM) 50 MG tablet, Take 1 tablet (50 mg total) by mouth every 6 (six) hours as needed., Disp: 60 tablet, Rfl: 2 .  traZODone (DESYREL) 50 MG tablet, TAKE 1/2 TO 1 TABLET AT BEDTIME AS NEEDED FOR SLEEP., Disp: 30 tablet, Rfl: 2 .  TROKENDI XR 100 MG CP24, 100 mg daily. , Disp: , Rfl: 4 .  valACYclovir (VALTREX) 1000 MG tablet, Take 1 tablet (1,000 mg total) by mouth 2 (two) times daily as needed (fever blisters)., Disp: 20 tablet, Rfl: 3    Review of Systems  Constitutional: Negative for chills and fever.  HENT: Negative for congestion.   Eyes: Negative for blurred vision and double vision.  Respiratory: Positive for cough. Negative for sputum production, shortness of breath and wheezing.        Mild intermittent cough  Cardiovascular: Negative for chest pain.  Gastrointestinal: Negative for heartburn and nausea.  Genitourinary: Negative for dysuria.  Musculoskeletal: Negative for myalgias.  Neurological: Negative for dizziness and headaches.  Psychiatric/Behavioral: Nervous/anxious: .vs.       Allergies:  Adhesive [tape]; Coreg [carvedilol]; Metoprolol; Penicillin g; Lisinopril; Prednisone; Latex; and Levofloxacin  Physical Examination:  Ht 5' 5.5" (1.664 m)    BMI 27.53 kg/m    General Appearance: No distress  HEENT: PERRLA, no ptosis, no other lesions noticed Pulmonary:normal breath sounds., diaphragmatic excursion normal.No wheezing, No rales   Cardiovascular:  Normal S1,S2.  No m/r/g.     Abdomen:Exam: Benign, Soft, non-tender, No masses  Skin:   warm, no rashes, no ecchymosis  Extremities: normal, no cyanosis, clubbing, warm with normal capillary refill.     07/09/15 PFTs - FEV1 80, FEV1/FVC 72, FEF 25-70 562, DLCO 91%. No significant obstruction,recurrent bronchodilator response, DLCO uncorrected within normal limits, normal curves. 6 minute walk test-no significant desaturations, lowest desaturation 96%, 1181 feet, 360 m    Assessment and Plan:  57 year old seen in follow-up for asthma-mild intemittent, well controlled at this time, has had intermittent cough after she had esophogeal dilatation with chest burning associated with cough, in the setting of excessive daytime sleepiness  EPWORTH SLEEP  SCORE 11, patient needs sleep study to assess for OSA  Asthma, chronic The above was discussed with  the patient, and she is in agreement with changing to advair 250/50 as she is using her albuterol more frequently .  Pfts are essentially normal, with moderate decrease in FEF 25-75 62% (FEV1 80%, FEV1/FVC 72%) Normal 98mwt - no desats, walked 1198ft/360m   she claims that she has allergies to prednisone, but her descriptions are more in line with side effects of high-dose steroids.  I have discussed with her in the future if she does need prednisone, we can start off at lower dose, she will consider this when that time comes.  Plan: -Advair 250/50  -gargle and rinse after each use.  -Avoid noxious substances such as smoke, perfumes, and other sick contacts. -Continue with outpatient allergy regiment. Her current cough is more likely related to GI procedure from last week   Allerghic Rhinitis-not under control - zyrtec -  flonase  GI-esoph dilatation Patient needs to call GI to assess her symtpoms  CArdiology-h/o CAD and s/p pacemaker Patient needs to follow up with cardiology if symptoms persist  GERD -started protonix  EXCESSIVE DAYTIME SLEEPINESS Needs sleep study to assess for OSA  Follow up in 3 months  Patient satisfied with Plan of action and management. All questions answered  Suzanne Spears, M.D.  [facility]  Medical Director [place] Director [facility] Cardio-Pulmonary Department

## 2017-04-16 NOTE — Patient Instructions (Signed)
PLEASE CALL GI TO ASSESS SYMPTOMS AFTER DILATATION PLEASE CALL CARDIOLOGY OF SYMPTOMS PERSIST  CONTINUE INHALERS AS PRESCRIBED PATIENT NEEDS SLEEP STUDY TO ASSESS FOR OSA

## 2017-04-16 NOTE — Telephone Encounter (Signed)
Yes, she still needs the barium swallow since Dr Vira Agar didn't find a stricture on her endoscopy. The swallow study will see if there is a mechanical problem with the way her esophageal muscles work

## 2017-04-16 NOTE — Telephone Encounter (Signed)
I called Suzanne Spears to follow up if she still needed the [facility] Suzanne Spears wanted to know if you thought she still needed it? Plus Dr Mortimer Fries will schedule Suzanne Spears a sleep study. Please advise?

## 2017-04-20 NOTE — Telephone Encounter (Signed)
Ok. Thank you I'll let pt know.

## 2017-04-22 ENCOUNTER — Other Ambulatory Visit: Payer: Self-pay | Admitting: Internal Medicine

## 2017-04-22 ENCOUNTER — Telehealth: Payer: Self-pay

## 2017-04-22 NOTE — Telephone Encounter (Signed)
Copied from [place]. Topic: General - Other >> Apr 22, 2017 11:02 AM Ivar Drape wrote: Reason for CRM:   Patient needs to talk to ReShetta concerning the The Barium Swallow test and the CT Scan that she was told she needed.  Please call the patient back at [phone]

## 2017-04-23 NOTE — Telephone Encounter (Signed)
Pt called back in to speak with Rasheeda in regards to her CT Scan not being in the system.Marland Kitchen  Spoke with Rasheeda, she will call pt back. Informed pt, she said okay she is currently on lunch break but will be waiting for a call back.

## 2017-04-23 NOTE — Telephone Encounter (Signed)
fyi

## 2017-04-26 ENCOUNTER — Encounter: Payer: Self-pay | Admitting: Internal Medicine

## 2017-04-28 ENCOUNTER — Encounter: Payer: Self-pay | Admitting: *Deleted

## 2017-04-28 ENCOUNTER — Other Ambulatory Visit: Payer: Self-pay | Admitting: Internal Medicine

## 2017-05-04 ENCOUNTER — Ambulatory Visit (INDEPENDENT_AMBULATORY_CARE_PROVIDER_SITE_OTHER): Payer: BLUE CROSS/BLUE SHIELD

## 2017-05-04 DIAGNOSIS — I5032 Chronic diastolic (congestive) heart failure: Secondary | ICD-10-CM

## 2017-05-04 DIAGNOSIS — Z95810 Presence of automatic (implantable) cardiac defibrillator: Secondary | ICD-10-CM

## 2017-05-04 NOTE — Progress Notes (Signed)
EPIC Encounter for ICM Monitoring  Patient Name: Suzanne Spears is a 57 y.o. female Date: 05/04/2017 Primary Care Physican: [name], MD Primary Cardiologist: Rockey Situ Electrophysiologist: Caryl Comes Dry Weight:171lbs  Bi-V Pacing: 98.4%       Heart Failure questions reviewed, pt asymptomatic.  Discussed salt intake and advised the report showing a pattern in last month of more days with fluid.  She has been eating ritz crackers with pimento cheese and chicken salad daily.  Discussed to review food labels for salt content and how to balance the intake so she can limit to 2000 mg daily.    Thoracic impedance trending back to normal.  Prescribed dosage: Furosemide 20 mg 1 tablet as needed. Patient takes Furosemide daily instead of PRN.  Potassium 10 mEq 1 tablet daily  Labs: 03/31/2017 Creatinine 1.37, BUN 25, Potassium 3.8, Sodium 138, EGFR 42.41 11/27/2016 Creatinine 1.08, BUN 17, Potassium 4.0, Sodium 138, EGFR 57->60 03/31/2016 Creatinine 1.18, BUN 15, Potassium 3.4, Sodium 137, EGFR 51-59  Recommendations: No changes.  Advised to limit salt intake to 2000 mg/day and fluid intake to < 2 liters/day.  Encouraged to call for fluid symptoms.  Follow-up plan: ICM clinic phone appointment on 06/04/2017.    Copy of ICM check sent to Dr. Caryl Comes.   3 month ICM trend: 05/04/2017    1 Year ICM trend:       [name], RN 05/04/2017 9:46 AM

## 2017-05-11 ENCOUNTER — Other Ambulatory Visit: Payer: Self-pay | Admitting: Internal Medicine

## 2017-05-11 ENCOUNTER — Other Ambulatory Visit: Payer: Self-pay | Admitting: Cardiovascular Disease

## 2017-05-11 ENCOUNTER — Ambulatory Visit
Admission: RE | Admit: 2017-05-11 | Discharge: 2017-05-11 | Disposition: A | Discharge status: Home / Self Care | Payer: BLUE CROSS/BLUE SHIELD | Source: Ambulatory Visit | Attending: Internal Medicine | Admitting: Internal Medicine

## 2017-05-11 DIAGNOSIS — Z981 Arthrodesis status: Secondary | ICD-10-CM | POA: Insufficient documentation

## 2017-05-11 DIAGNOSIS — K5790 Diverticulosis of intestine, part unspecified, without perforation or abscess without bleeding: Secondary | ICD-10-CM | POA: Insufficient documentation

## 2017-05-11 DIAGNOSIS — R1310 Dysphagia, unspecified: Secondary | ICD-10-CM | POA: Diagnosis present

## 2017-05-12 ENCOUNTER — Ambulatory Visit (INDEPENDENT_AMBULATORY_CARE_PROVIDER_SITE_OTHER): Payer: BLUE CROSS/BLUE SHIELD | Admitting: Internal Medicine

## 2017-05-12 ENCOUNTER — Encounter: Payer: Self-pay | Admitting: Internal Medicine

## 2017-05-12 ENCOUNTER — Ambulatory Visit (INDEPENDENT_AMBULATORY_CARE_PROVIDER_SITE_OTHER): Payer: BLUE CROSS/BLUE SHIELD

## 2017-05-12 VITALS — BP 110/76 | HR 96 | Temp 97.9°F | Resp 15 | Ht 65.5 in | Wt 170.8 lb

## 2017-05-12 DIAGNOSIS — Z1231 Encounter for screening mammogram for malignant neoplasm of breast: Secondary | ICD-10-CM

## 2017-05-12 DIAGNOSIS — M130 Polyarthritis, unspecified: Secondary | ICD-10-CM

## 2017-05-12 DIAGNOSIS — N183 Chronic kidney disease, stage 3 unspecified: Secondary | ICD-10-CM

## 2017-05-12 DIAGNOSIS — Z1239 Encounter for other screening for malignant neoplasm of breast: Secondary | ICD-10-CM

## 2017-05-12 NOTE — Patient Instructions (Addendum)
Ask dr Candiss Norse If he is ok with your using  turmeric for joint pain 500 mg   Schedule 4 tylenol daily (you can [name] 2000 mg every day safely,  No harm to kidneys)  Use the tramadol once daily  For severe pain   X rays of finger,  And labs for rule out inflammatory arthritis

## 2017-05-12 NOTE — Progress Notes (Signed)
Patient ID: Suzanne Spears, female    DOB: [birth year]  Age: 57 y.o. MRN: [id number]  The patient was scheduled for preventive  examination  But over  40 minutes of time was spent discussing her pai complaints.    CC: The primary encounter diagnosis was CKD (chronic kidney disease) stage 3, GFR 30-59 ml/min ([place]). Diagnoses of Polyarthritis, Breast cancer screening, and Polyarthritis of multiple sites were also pertinent to this visit.  1) Has resumed avig Regularly appearing menstrual periods, heavy for one day  Dark old  Blood ,  Then scant for 1 more day .   Using pads because tampons are  uncomfortable Has low back pain ("like back labor)   After the period starts.  Has hot flashes near the time of period.  .  Works with younger women every day .  2) Arthritis pain in fingers, worse in the mornings,  Hard to ungrasp /unfold the fingers if she makes a fist.  Index finger knuck larger,  Uses keyboard at work many hours per day.  Has CTS  And cervical spine DDD with radiculopathy  On right arm.  Getting ESI injections by [facility]  3) Knees hurt to touch . Feel swollen at times . Wearing compression stockings.    4) Not exercising bc she is tired all the time   4Headaches chronic, for the last 2-3 months.   Aches from nose along the zygoma to the  TMJ now going down to the mandible   Clenches jaw,  Sleeps with a night guard .  treated by Manuella Ghazi in January for multiple headache etiologies with toradol.  Injection .  Gave her months of relief but    Cannabis oil. Has not tried it.  Wondering if it would help her migraines   Already taking cymbalta 60 mg. Daily baclofen at night, but can't sleep on it .  Does not take tramadol on a daily basis but can.   Swallow evaluation done yesterday was normal .  No dysphagia     History Suzanne Spears has a past medical history of AICD (automatic cardioverter/defibrillator) present, Allergy, Anemia, Asthma, Cardiomyopathy, dilated, nonischemic (HCC), Chronic  systolic (congestive) heart failure ([place]), Cough, Depression, Dizziness, GERD (gastroesophageal reflux disease), Headache(784.0), Heart murmur, Hip pain, right (200), History of bronchitis, History of shingles, Hyperlipidemia, Hypertension, Left bundle branch block (2008), Migraine, Mitral valve prolapse syndrome, Pericarditis (2008), Peripheral neuropathy, Pneumonia (2016), Presence of permanent cardiac pacemaker, Shortness of breath dyspnea, and Spinal headache.   She has a past surgical history that includes Cesarean section; Appendectomy (2009); turbinectomy (2009); ganglionic cyst (remote); tendon release surgery (Left); Colonoscopy; Tonsillectomy; Anterior cervical decomp/discectomy fusion (N/A, 10/21/2012); bi-ventricular implantable cardioverter defibrillator (N/A, 06/15/2014); blood clot removed from left top hand; Thoracotomy (Left, 11/30/2014); Epicardial pacing lead placement (N/A, 11/30/2014); Cardiac catheterization (01/13/05/2015); Cardiac catheterization; and Esophagogastroduodenoscopy (egd) with propofol (N/A, 04/07/2017).   Her family history includes Breast cancer (age of onset: 76) in her maternal grandmother; Cancer in her paternal grandfather; Cancer (age of onset: 58) in her mother; Cancer (age of onset: 41) in her maternal grandmother; Diabetes in her maternal grandmother; Pneumonia in her father; Supraventricular tachycardia in her daughter.She reports that  has never smoked. she has never used smokeless tobacco. She reports that she drinks alcohol. She reports that she does not use drugs.  Outpatient Medications Prior to Visit  Medication Sig Dispense Refill  . albuterol (PROVENTIL) (2.5 MG/3ML) 0.083% nebulizer solution Take 3 mLs (2.5 mg total) by nebulization every 6 (six) hours  as needed for wheezing or shortness of breath. 150 mL 1  . albuterol (VENTOLIN HFA) 108 (90 Base) MCG/ACT inhaler INHALE 2 PUFFS BY MOUTH INTO THE LUNGS EVERY 4 HOURS AS NEEDED FOR WHEEZING. 18 g 3  .  ARNUITY ELLIPTA 100 MCG/ACT AEPB INHALE 1 PUFF BY MOUTH INTO THE LUNGS DAILY AS DIRECTED BY PHYSICIAN. 30 each 4  . Azelastine-Fluticasone 137-50 MCG/ACT SUSP Place 1 spray into the nose 2 (two) times daily. 23 g 5  . baclofen (LIORESAL) 10 MG tablet Take 10 mg by mouth 2 (two) times daily.   0  . butalbital-acetaminophen-caffeine (FIORICET, ESGIC) 50-325-40 MG tablet Take 1-2 tablets by mouth every 6 (six) hours as needed for headache. 20 tablet 0  . cetirizine (ZYRTEC) 10 MG chewable tablet Chew 10 mg by mouth daily.    . citalopram (CELEXA) 20 MG tablet TAKE (1) TABLET BY MOUTH EVERY DAY 30 tablet 3  . cyanocobalamin (,VITAMIN B-12,) 1000 MCG/ML injection INJECT 1 ML INTO THE MUSCLE ONCE A WEEK 4 mL 3  . Diclofenac Potassium (CAMBIA) 50 MG PACK Take by mouth.    . DULoxetine (CYMBALTA) 60 MG capsule TAKE (1) CAPSULE BY MOUTH EVERY DAY 30 capsule 2  . etodolac (LODINE) 500 MG tablet Take 1 tablet (500 mg total) by mouth 2 (two) times daily. 60 tablet 3  . fluticasone (FLONASE) 50 MCG/ACT nasal spray USE 2 SPRAYS INTO BOTH NOSTRILS ONCE DAILY AS DIRECTED BY PHYSICIAN. 48 g 2  . Fluticasone Furoate (ARNUITY ELLIPTA) 100 MCG/ACT AEPB Inhale 1 puff into the lungs daily. 30 each 5  . Fluticasone-Salmeterol (ADVAIR DISKUS) 250-50 MCG/DOSE AEPB Inhale 1 puff into the lungs 2 (two) times daily. 1 each 12  . furosemide (LASIX) 20 MG tablet Take 20 mg by mouth daily as needed.  10  . gabapentin (NEURONTIN) 100 MG capsule TAKE ONE (1) CAPSULE THREE (3) TIMES EACH DAY 90 capsule 1  . HYDROcodone-acetaminophen (NORCO/VICODIN) 5-325 MG tablet Take 1 tablet by mouth every 8 (eight) hours as needed for moderate pain.    Lenda Kelp FE 1/20 1-20 MG-MCG tablet TAKE (1) TABLET BY MOUTH EVERY DAY 90 tablet 1  . losartan (COZAAR) 50 MG tablet TAKE (1) TABLET BY MOUTH EVERY DAY 90 tablet 1  . magnesium 30 MG tablet Take 30 mg by mouth daily.    . magnesium gluconate (MAGONATE) 500 MG tablet Take 500 mg by mouth 2 (two)  times daily.    . montelukast (SINGULAIR) 10 MG tablet TAKE (1) TABLET BY MOUTH EVERY DAY 30 tablet 3  . pantoprazole (PROTONIX) 40 MG tablet TAKE (1) TABLET BY MOUTH EVERY DAY 30 tablet 1  . potassium chloride (K-DUR) 10 MEQ tablet TAKE (1) TABLET BY MOUTH EVERY DAY 90 tablet 2  . Topiramate ER (QUDEXY XR) 50 MG CS24 sprinkle capsule   2  . traMADol (ULTRAM) 50 MG tablet Take 1 tablet (50 mg total) by mouth every 6 (six) hours as needed. 60 tablet 2  . traZODone (DESYREL) 50 MG tablet TAKE 1/2 TO 1 TABLET AT BEDTIME AS NEEDED FOR SLEEP. 30 tablet 2  . TROKENDI XR 100 MG CP24 100 mg daily.   4  . valACYclovir (VALTREX) 1000 MG tablet Take 1 tablet (1,000 mg total) by mouth 2 (two) times daily as needed (fever blisters). 20 tablet 3   No facility-administered medications prior to visit.     Review of Systems  Objective:  BP 110/76 (BP Location: Left Arm, Patient Position: Sitting, Cuff Size:  Normal)   Pulse 96   Temp 97.9 F (36.6 C) (Oral)   Resp 15   Ht 5' 5.5" (1.664 m)   Wt 170 lb 12.8 oz (77.5 kg)   SpO2 99%   BMI 27.99 kg/m   Physical Exam    Assessment & Plan:   Problem List Items Addressed This Visit    Polyarthritis of multiple sites    Films of right index finger showed no erosions.  Esr, CRP normal. No signs of inflammatoryor autoimmune  disorder .  Continue cymbalta for chronic pain,  Add tylenol and tramadol .  Discuss adding turmeric with neprhologist.  Lab Results  Component Value Date   ESRSEDRATE 27 05/12/2017   Lab Results  Component Value Date   CRP 1.3 05/12/2017   Lab Results  Component Value Date   ANA Negative 05/12/2017   Lab Results  Component Value Date   ANA Negative 05/12/2017   RF <14 05/12/2017         Other Visit Diagnoses    CKD (chronic kidney disease) stage 3, GFR 30-59 ml/min (HCC)    -  Primary   Relevant Orders   Basic metabolic panel (Completed)   Polyarthritis       Relevant Orders   Sedimentation rate (Completed)    C-reactive protein (Completed)   ANA w/Reflex if Positive (Completed)   Rheumatoid Factor (Completed)   Uric acid (Completed)   DG Finger Index Right (Completed)   Breast cancer screening       Relevant Orders   MM SCREENING BREAST TOMO BILATERAL    A total of 40 minutes was spent with patient more than half of which was spent in counseling patient on the above mentioned issues , reviewing and explaining recent labs and imaging studies done, and coordination of care.  I am having Isidoro Donning. [employer]" maintain her magnesium, valACYclovir, TROKENDI XR, baclofen, albuterol, ARNUITY ELLIPTA, HYDROcodone-acetaminophen, Fluticasone Furoate, Azelastine-Fluticasone, cetirizine, etodolac, traMADol, furosemide, gabapentin, butalbital-acetaminophen-caffeine, albuterol, Fluticasone-Salmeterol, cyanocobalamin, citalopram, fluticasone, traZODone, JUNEL FE 1/20, losartan, Diclofenac Potassium, magnesium gluconate, DULoxetine, montelukast, potassium chloride, pantoprazole, and Topiramate ER.  No orders of the defined types were placed in this encounter.   There are no discontinued medications.  Follow-up: Return in about 2 months (around 07/12/2017).   [name], MD

## 2017-05-13 DIAGNOSIS — M130 Polyarthritis, unspecified: Secondary | ICD-10-CM | POA: Insufficient documentation

## 2017-05-13 LAB — BASIC METABOLIC PANEL
BUN: 19 mg/dL (ref 6–23)
CO2: 21 mEq/L (ref 19–32)
Calcium: 9.8 mg/dL (ref 8.4–10.5)
Chloride: 106 mEq/L (ref 96–112)
Creatinine, Ser: 1.38 mg/dL — ABNORMAL HIGH (ref 0.40–1.20)
GFR: 42.04 mL/min — ABNORMAL LOW (ref >60.00)
Glucose, Bld: 105 mg/dL — ABNORMAL HIGH (ref 70–99)
Potassium: 4.2 mEq/L (ref 3.5–5.1)
Sodium: 141 mEq/L (ref 135–145)

## 2017-05-13 LAB — ANA W/REFLEX IF POSITIVE: Anti Nuclear Antibody(ANA): NEGATIVE

## 2017-05-13 LAB — SEDIMENTATION RATE: Sed Rate: 27 mm/hr (ref 0–30)

## 2017-05-13 LAB — URIC ACID: Uric Acid, Serum: 6.3 mg/dL (ref 2.4–7.0)

## 2017-05-13 LAB — RHEUMATOID FACTOR: Rhuematoid fact SerPl-aCnc: <14 IU/mL (ref <14)

## 2017-05-13 LAB — C-REACTIVE PROTEIN: CRP: 1.3 mg/dL (ref 0.5–20.0)

## 2017-05-13 NOTE — Assessment & Plan Note (Addendum)
Films of right index finger showed no erosions.  Esr, CRP normal. No signs of inflammatoryor autoimmune  disorder .  Continue cymbalta for chronic pain,  Add tylenol and tramadol .  Discuss adding turmeric with neprhologist.  Lab Results  Component Value Date   ESRSEDRATE 27 05/12/2017   Lab Results  Component Value Date   CRP 1.3 05/12/2017   Lab Results  Component Value Date   ANA Negative 05/12/2017   Lab Results  Component Value Date   ANA Negative 05/12/2017   RF <14 05/12/2017

## 2017-05-15 ENCOUNTER — Ambulatory Visit: Payer: BLUE CROSS/BLUE SHIELD | Attending: Internal Medicine

## 2017-05-15 DIAGNOSIS — G4710 Hypersomnia, unspecified: Secondary | ICD-10-CM | POA: Diagnosis not present

## 2017-05-20 DIAGNOSIS — G4710 Hypersomnia, unspecified: Secondary | ICD-10-CM | POA: Diagnosis not present

## 2017-05-25 ENCOUNTER — Ambulatory Visit: Payer: Self-pay | Admitting: *Deleted

## 2017-05-25 NOTE — Telephone Encounter (Signed)
fyi

## 2017-05-25 NOTE — Telephone Encounter (Signed)
Pt called stating that she was unsteady last night when she got up to go to toliet; she states she fell into shower but did not hit her head; she states that she hit her elbow, arm and rear end is sore; she also says that she feels spaced out; this morning she took a gabapentin, and tylenol in addition to her normal medications; the pt said that she felt like she was developing a migraine yesterday and had floaters and describes a "swishy feeling like water in her head"; recommendations made per nurse protocol to include seeing a physician within 24 hours; pt request to see Dr Derrel Nip but she has no availability within the guidelines per protocol;pt offered and accepted appointment with Dr Aundra Dubin Jacklynn Lewis 05/26/17 at 1400; she verbalizes understanding; will route to office for notification of this upcoming appointment. Reason for Disposition . [1] MODERATE dizziness (e.g., interferes with normal activities) AND [2] has NOT been evaluated by physician for this  (Exception: dizziness caused by heat exposure, sudden standing, or poor fluid intake)  Answer Assessment - Initial Assessment Questions 1. DESCRIPTION: "Describe your dizziness."     "swishy feeling like water in her head"; " feels loopy" 2. LIGHTHEADED: "Do you feel lightheaded?" (e.g., somewhat faint, woozy, weak upon standing)     Feels off balance 3. VERTIGO: "Do you feel like either you or the room is spinning or tilting?" (i.e. vertigo)     no 4. SEVERITY: "How bad is it?"  "Do you feel like you are going to faint?" "Can you stand and walk?"   - MILD - walking normally   - MODERATE - interferes with normal activities (e.g., work, school)    - SEVERE - unable to stand, requires support to walk, feels like passing out now.      moderate 5. ONSET:  "When did the dizziness begin?"     05/25/17 at 0300 6. AGGRAVATING FACTORS: "Does anything make it worse?" (e.g., standing, change in head position)     No; felt off balance when she stood up  7.  HEART RATE: "Can you tell me your heart rate?" "How many beats in 15 seconds?"  (Note: not all patients can do this)       Not able to perform this task 8. CAUSE: "What do you think is causing the dizziness?"     Not sure 9. RECURRENT SYMPTOM: "Have you had dizziness before?" If so, ask: "When was the last time?" "What happened that time?"     no 10. OTHER SYMPTOMS: "Do you have any other symptoms?" (e.g., fever, chest pain, vomiting, diarrhea, bleeding)       Soreness and bruising from fall 11. PREGNANCY: "Is there any chance you are pregnant?" "When was your last menstrual period?"     No LMP 05/21/17  Protocols used: DIZZINESS [facility]

## 2017-05-26 ENCOUNTER — Ambulatory Visit: Payer: BLUE CROSS/BLUE SHIELD | Admitting: Internal Medicine

## 2017-05-26 ENCOUNTER — Encounter: Payer: Self-pay | Admitting: Internal Medicine

## 2017-05-26 VITALS — BP 100/68 | HR 86 | Temp 98.1°F | Ht 66.25 in | Wt 172.4 lb

## 2017-05-26 DIAGNOSIS — R51 Headache: Secondary | ICD-10-CM | POA: Diagnosis not present

## 2017-05-26 DIAGNOSIS — R269 Unspecified abnormalities of gait and mobility: Secondary | ICD-10-CM

## 2017-05-26 DIAGNOSIS — I429 Cardiomyopathy, unspecified: Secondary | ICD-10-CM

## 2017-05-26 DIAGNOSIS — Z8669 Personal history of other diseases of the nervous system and sense organs: Secondary | ICD-10-CM | POA: Diagnosis not present

## 2017-05-26 DIAGNOSIS — I951 Orthostatic hypotension: Secondary | ICD-10-CM | POA: Diagnosis not present

## 2017-05-26 DIAGNOSIS — R519 Headache, unspecified: Secondary | ICD-10-CM

## 2017-05-26 MED ORDER — LOSARTAN POTASSIUM 25 MG PO TABS: 25.0000 mg | ORAL_TABLET | Freq: Every day | ORAL | 1 refills | Status: DC

## 2017-05-26 NOTE — Patient Instructions (Addendum)
Lets do CT head  Please call Dr. Manuella Ghazi to follow up and Dr. Rockey Situ and Caryl Comes to notify fall on 05/24/17  Reduce Losartan to 25 mg daily from 50 or 1/2 50 mg tablet Reduce Topiramate to 100 mg daily and let Dr. 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 Blood pressure is a measurement of how strongly, or weakly, your blood is pressing against the walls of your arteries. Arteries are blood vessels that carry blood from your heart throughout your body. When blood pressure is too low, you may not get enough blood to your brain or to the rest of your organs. This can cause weakness, light-headedness, rapid heartbeat, and fainting. This can last for just a few seconds or for up to a few minutes. Orthostatic hypotension is usually not a serious problem. However, if it happens frequently or gets worse, it may be a sign of something more serious. What are the causes? This condition may be caused by:  Sudden changes in posture, such as standing up quickly after you have been sitting or lying down.  Blood loss.  Loss of body fluids (dehydration).  Heart problems.  Hormone (endocrine) problems.  Pregnancy.  Severe infection.  Lack of certain nutrients.  Severe allergic reactions (anaphylaxis).  Certain medicines, such as blood pressure medicine or medicines that make the body lose excess fluids (diuretics). Sometimes, this condition can be caused by not taking medicine as directed, such as taking too much of a certain medicine.  What increases the risk? Certain factors can make you more likely to develop orthostatic hypotension, including:  Age. Risk increases as you get older.  Conditions that affect the heart or the central nervous system.  Taking certain medicines, such as  blood pressure medicine or diuretics.  Being pregnant.  What are the signs or symptoms? Symptoms of this condition may include:  Weakness.  Light-headedness.  Dizziness.  Blurred vision.  Fatigue.  Rapid heartbeat.  Fainting, in severe cases.  How is this diagnosed? This condition is diagnosed based on:  Your medical history.  Your symptoms.  Your blood pressure measurement. Your health care provider will check your blood pressure when you are: ? Lying down. ? Sitting. ? Standing.  A blood pressure reading is recorded as two numbers, such as "120 over 80" (or 120/80). The first ("top") number is called the systolic pressure. It is a measure of the pressure in your arteries as your heart beats. The second ("bottom") number is called the diastolic pressure. It is a measure of the pressure in your arteries when your heart relaxes between beats. Blood pressure is measured in a unit called mm Hg. Healthy blood pressure for adults is 120/80. If your blood pressure is below 90/60, you may be diagnosed with hypotension. Other information or tests that may be used to diagnose orthostatic hypotension include:  Your other vital signs, such as your heart rate and temperature.  Blood tests.  Tilt table test. For this test, you will be safely secured to a table that moves you from a lying position to an upright position. Your heart rhythm and blood pressure will be monitored during the test.  How is this treated? Treatment for this condition may include:  Changing your diet. This may involve eating more salt (sodium) or drinking  more water.  Taking medicines to raise your blood pressure.  Changing the dosage of certain medicines you are taking that might be lowering your blood pressure.  Wearing compression stockings. These stockings help to prevent blood clots and reduce swelling in your legs.  In some cases, you may need to go to the hospital for:  Fluid replacement. This  means you will receive fluids through an IV tube.  Blood replacement. This means you will receive donated blood through an IV tube (transfusion).  Treating an infection or heart problems, if this applies.  Monitoring. You may need to be monitored while medicines that you are taking wear off.  Follow these instructions at home: Eating and drinking   Drink enough fluid to keep your urine clear or pale yellow.  Eat a healthy diet and follow instructions from your health care provider about eating or drinking restrictions. A healthy diet includes: ? Fresh fruits and vegetables. ? Whole grains. ? Lean meats. ? Low-fat dairy products.  Eat extra salt only as directed. Do not add extra salt to your diet unless your health care provider told you to do that.  Eat frequent, small meals.  Avoid standing up suddenly after eating. Medicines  Take over-the-counter and prescription medicines only as told by your health care provider. ? Follow instructions from your health care provider about changing the dosage of your current medicines, if this applies. ? Do not stop or adjust any of your medicines on your own. General instructions  Wear compression stockings as told by your health care provider.  Get up slowly from lying down or sitting positions. This gives your blood pressure a chance to adjust.  Avoid hot showers and excessive heat as directed by your health care provider.  Return to your normal activities as told by your health care provider. Ask your health care provider what activities are safe for you.  Do not use any products that contain nicotine or tobacco, such as cigarettes and e-cigarettes. If you need help quitting, ask your health care provider.  Keep all follow-up visits as told by your health care provider. This is important. Contact a health care provider if:  You vomit.  You have diarrhea.  You have a fever for more than 2-3 days.  You feel more thirsty than  usual.  You feel weak and tired. Get help right away if:  You have chest pain.  You have a fast or irregular heartbeat.  You develop numbness in any part of your body.  You cannot move your arms or your legs.  You have trouble speaking.  You become sweaty or feel lightheaded.  You faint.  You feel short of breath.  You have trouble staying awake.  You feel confused. This information is not intended to replace advice given to you by your health care provider. Make sure you discuss any questions you have with your health care provider. Document Released: 01/31/2002 Document Revised: 10/30/2015 Document Reviewed: 08/03/2015 Elsevier Interactive Patient Education  2018 [place] Headache A cluster headache is a type of headache that causes deep, intense head pain. Cluster headaches can last from 15 minutes to 3 hours. They usually occur:  On one side of the head. They may occur on the other side when a new cluster of headaches begins.  Repeatedly over weeks to months.  Several times a day.  At the same time of day, often at night.  More often in the fall and springtime.  What  are the causes? The cause of this condition is not known. What increases the risk? This condition is more likely to develop in:  Males.  People who drink alcohol.  People who smoke or use products that contain nicotine or tobacco.  People who take medicines that cause blood vessels to expand, such as nitroglycerin.  People who take antihistamines.  What are the signs or symptoms? Symptoms of this condition include:  Severe pain on one side of the head that begins behind or around your eye or temple.  Pain on one side of the head.  Nausea.  Sensitivity to light.  Runny nose and nasal stuffiness.  Sweaty, pale skin on the face.  Droopy or swollen eyelid, eye redness, or tearing.  Restlessness and agitation.  How is this diagnosed? This condition may be  diagnosed based on:  Your symptoms.  A physical exam.  Your health care provider may order tests to see if your headaches are caused by another medical condition. These tests may show that you do not have cluster headaches. Tests may include:  A CT scan of your head.  An MRI of your head.  Lab tests.  How is this treated? This condition may be treated with:  Medicines to relieve pain and to prevent repeated (recurrent) attacks. Some people may need a combination of medicines.  Oxygen. This helps to relieve pain.  Follow these instructions at home: Headache diary Keep a headache diary as told by your health care provider. Doing this can help you and your health care provider figure out what triggers your headaches. In your headache diary, include information about:  The time of day that your headache started and what you were doing when it began.  How long your headache lasted.  Where your pain started and whether it moved to other areas.  The type of pain, such as burning, stabbing, throbbing, or cramping.  Your level of pain. Use a pain scale and rate the pain with a number from 1 (mild) up to 10 (severe).  The treatment that you used, and any change in symptoms after treatment.  Medicines  Take over-the-counter and prescription medicines only as told by your health care provider.  Do not drive or use heavy machinery while taking prescription pain medicine.  Use oxygen as told by your health care provider. Lifestyle  Follow a regular sleep schedule. Do not vary the time that you go to bed or the amount that you sleep from day to day. It is important to stay on the same schedule during a cluster period to help prevent headaches.  Exercise regularly.  Eat a healthy diet and avoid foods that may trigger your headaches.  Avoid alcohol.  Do not use any products that contain nicotine or tobacco, such as cigarettes and e-cigarettes. If you need help quitting, ask your  health care provider. Contact a health care provider if:  Your headaches change, become more severe, or occur more often.  The medicine or oxygen that your health care provider recommended does not help. Get help right away if:  You faint.  You have weakness or numbness, especially on one side of your body or face.  You have double vision.  You have nausea or vomiting that does not go away within several hours.  You have trouble talking, walking, or keeping your balance.  You have pain or stiffness in your neck.  You have a fever. Summary  A cluster headache is a type of headache that causes  deep, intense head pain, usually on one side of the head.  Keep a headache diary to help discover what triggers your headaches.  A regular sleep schedule can help prevent headaches. This information is not intended to replace advice given to you by your health care provider. Make sure you discuss any questions you have with your health care provider. Document Released: 02/10/2005 Document Revised: 10/23/2015 Document Reviewed: 10/23/2015 [employer] Interactive Patient Education  [employer].

## 2017-05-27 ENCOUNTER — Ambulatory Visit
Admission: RE | Admit: 2017-05-27 | Discharge: 2017-05-27 | Disposition: A | Discharge status: Home / Self Care | Payer: BLUE CROSS/BLUE SHIELD | Source: Ambulatory Visit | Attending: Internal Medicine | Admitting: Internal Medicine

## 2017-05-27 DIAGNOSIS — R51 Headache: Secondary | ICD-10-CM | POA: Diagnosis not present

## 2017-05-27 DIAGNOSIS — R519 Headache, unspecified: Secondary | ICD-10-CM

## 2017-05-27 DIAGNOSIS — R269 Unspecified abnormalities of gait and mobility: Secondary | ICD-10-CM | POA: Diagnosis present

## 2017-05-28 ENCOUNTER — Encounter: Payer: Self-pay | Admitting: Internal Medicine
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
## 2017-05-29 NOTE — Progress Notes (Signed)
She is scheduled 4/8 @ 1015 with dr Manuella Ghazi

## 2017-06-04 ENCOUNTER — Ambulatory Visit (INDEPENDENT_AMBULATORY_CARE_PROVIDER_SITE_OTHER): Payer: BLUE CROSS/BLUE SHIELD | Admitting: *Deleted

## 2017-06-04 DIAGNOSIS — I5032 Chronic diastolic (congestive) heart failure: Secondary | ICD-10-CM

## 2017-06-04 DIAGNOSIS — Z95810 Presence of automatic (implantable) cardiac defibrillator: Secondary | ICD-10-CM

## 2017-06-04 DIAGNOSIS — I428 Other cardiomyopathies: Secondary | ICD-10-CM | POA: Diagnosis not present

## 2017-06-04 NOTE — Progress Notes (Signed)
EPIC Encounter for ICM Monitoring  Patient Name: Suzanne Spears is a 57 y.o. female Date: 06/04/2017 Primary Care Physican: [name], MD Primary Cardiologist: Rockey Situ Electrophysiologist: Caryl Comes Dry Weight: Previous weight171lbs  Bi-V Pacing: 98.5%       Heart Failure questions reviewed, pt has the flu and not able to take daily Lasix and drinking extra fluid to stay hydrated.  She does not feel like she has fluid symptoms and she is starting to feel better.    Thoracic impedance abnormal suggesting fluid accumulation starting 05/29/2017.  Prescribed dosage: Furosemide 20 mg 1 tablet as needed. Patient takes Furosemide daily instead of PRN.  Potassium 10 mEq 1 tablet daily  Labs: 05/12/2017 Creatinine 1.38, BUN 19, Potassium 4.2, Sodium 141, EGFR 42.04 03/31/2017 Creatinine 1.37, BUN 25, Potassium 3.8, Sodium 138, EGFR 42.41 11/27/2016 Creatinine 1.08, BUN 17, Potassium 4.0, Sodium 138, EGFR 57->60 03/31/2016 Creatinine 1.18, BUN 15, Potassium 3.4, Sodium 137, EGFR 51-59  Recommendations:  Advised to take the Furosemide when she can to help decrease the accumulation.  No changes today.  Encouraged to call for fluid symptoms.  Follow-up plan: ICM clinic phone appointment on 06/11/2017 to recheck fluid levels.    Copy of ICM check sent to Dr. Caryl Comes and Dr. Rockey Situ.   3 month ICM trend: 06/04/2017    1 Year ICM trend:       [name], RN 06/04/2017 2:25 PM

## 2017-06-04 NOTE — Progress Notes (Signed)
Remote ICD transmission.   

## 2017-06-10 ENCOUNTER — Encounter: Payer: Self-pay | Admitting: Cardiology

## 2017-06-10 ENCOUNTER — Encounter: Payer: Self-pay | Admitting: Internal Medicine

## 2017-06-11 ENCOUNTER — Ambulatory Visit (INDEPENDENT_AMBULATORY_CARE_PROVIDER_SITE_OTHER): Payer: Self-pay

## 2017-06-11 DIAGNOSIS — Z95810 Presence of automatic (implantable) cardiac defibrillator: Secondary | ICD-10-CM

## 2017-06-11 DIAGNOSIS — I5032 Chronic diastolic (congestive) heart failure: Secondary | ICD-10-CM

## 2017-06-12 ENCOUNTER — Telehealth: Payer: Self-pay

## 2017-06-12 NOTE — Progress Notes (Signed)
EPIC Encounter for ICM Monitoring  Patient Name: Suzanne Spears is a 57 y.o. female Date: 06/12/2017 Primary Care Physican: [name], MD Primary Cardiologist: Rockey Situ Electrophysiologist: Faustino Congress Weight:Previous weight171lbs  Bi-V Pacing: 98.5%       Attempted call to patient and unable to reach.  Left detailed message regarding transmission.  Transmission reviewed.    Thoracic impedance returned to normal since 06/04/2017 remote transmission.  Prescribed dosage: Furosemide 20 mg 1 tabletas needed. Patient takes Furosemide daily instead of PRN.Potassium 10 mEq 1 tablet daily  Labs: 05/12/2017 Creatinine 1.38, BUN 19, Potassium 4.2, Sodium 141, EGFR 42.04 03/31/2017 Creatinine 1.37, BUN 25, Potassium 3.8, Sodium 138, EGFR 42.41 11/27/2016 Creatinine 1.08, BUN 17, Potassium 4.0, Sodium 138, EGFR 57->60 03/31/2016 Creatinine 1.18, BUN 15, Potassium 3.4, Sodium 137, EGFR 51-59  Recommendations: Left voice mail with ICM number and encouraged to call if experiencing any fluid symptoms.  Follow-up plan: ICM clinic phone appointment on 07/09/2017.   Copy of ICM check sent to Dr. Caryl Comes.   3 month ICM trend: 06/12/2017    1 Year ICM trend:       [name], RN 06/12/2017 9:03 AM

## 2017-06-12 NOTE — Telephone Encounter (Signed)
Remote ICM transmission received.  Attempted call to patient and left detailed message per DPR regarding transmission and next ICM scheduled for 07/09/2017.  Advised to return call for any fluid symptoms or questions.    

## 2017-06-25 ENCOUNTER — Ambulatory Visit
Admission: RE | Admit: 2017-06-25 | Discharge: 2017-06-25 | Disposition: A | Discharge status: Home / Self Care | Payer: BLUE CROSS/BLUE SHIELD | Source: Ambulatory Visit | Attending: Internal Medicine | Admitting: Internal Medicine

## 2017-06-25 ENCOUNTER — Encounter (HOSPITAL_COMMUNITY): Payer: Self-pay

## 2017-06-25 DIAGNOSIS — Z1239 Encounter for other screening for malignant neoplasm of breast: Secondary | ICD-10-CM

## 2017-06-25 DIAGNOSIS — Z1231 Encounter for screening mammogram for malignant neoplasm of breast: Secondary | ICD-10-CM | POA: Diagnosis not present

## 2017-07-01 ENCOUNTER — Other Ambulatory Visit: Payer: Self-pay | Admitting: Internal Medicine

## 2017-07-08 ENCOUNTER — Other Ambulatory Visit: Payer: Self-pay | Admitting: Internal Medicine

## 2017-07-09 ENCOUNTER — Telehealth: Payer: Self-pay

## 2017-07-09 ENCOUNTER — Ambulatory Visit: Payer: BLUE CROSS/BLUE SHIELD | Admitting: Internal Medicine

## 2017-07-09 ENCOUNTER — Encounter: Payer: Self-pay | Admitting: Internal Medicine

## 2017-07-09 ENCOUNTER — Ambulatory Visit (INDEPENDENT_AMBULATORY_CARE_PROVIDER_SITE_OTHER): Payer: BLUE CROSS/BLUE SHIELD

## 2017-07-09 VITALS — BP 114/84 | HR 81 | Temp 98.1°F | Resp 14 | Ht 66.25 in | Wt 168.6 lb

## 2017-07-09 DIAGNOSIS — Z95810 Presence of automatic (implantable) cardiac defibrillator: Secondary | ICD-10-CM

## 2017-07-09 DIAGNOSIS — I5032 Chronic diastolic (congestive) heart failure: Secondary | ICD-10-CM

## 2017-07-09 DIAGNOSIS — K224 Dyskinesia of esophagus: Secondary | ICD-10-CM

## 2017-07-09 DIAGNOSIS — R1314 Dysphagia, pharyngoesophageal phase: Secondary | ICD-10-CM

## 2017-07-09 DIAGNOSIS — N183 Chronic kidney disease, stage 3 unspecified: Secondary | ICD-10-CM

## 2017-07-09 LAB — BASIC METABOLIC PANEL
BUN: 15 mg/dL (ref 6–23)
CO2: 25 mEq/L (ref 19–32)
Calcium: 9.3 mg/dL (ref 8.4–10.5)
Chloride: 107 mEq/L (ref 96–112)
Creatinine, Ser: 1.35 mg/dL — ABNORMAL HIGH (ref 0.40–1.20)
GFR: 43.1 mL/min — ABNORMAL LOW (ref >60.00)
Glucose, Bld: 93 mg/dL (ref 70–99)
Potassium: 4.3 mEq/L (ref 3.5–5.1)
Sodium: 138 mEq/L (ref 135–145)

## 2017-07-09 NOTE — Progress Notes (Signed)
Call back to patient.  Advised spoke with Dr Rockey Situ and Dr Aquilla Hacker nurse as well as Ignacia Bayley, PA.  Advised there is not any problem with using hot tub unless she has low BP.  If she has low BP it is not recommended since it will drop the pressure lower.  Advised to stay in the hot tub for long periods of time. She understood and was appreciative of the advice.

## 2017-07-09 NOTE — Patient Instructions (Addendum)
Try taking the last dose of gabapentin  Before dinner. This may be causing the nocturnal dizziness   Aqualogix is the water training tool  We discussed that can be purchased on line   Referral to Dr Dorothe Pea is in progress for the swallowing issue    BMET today

## 2017-07-09 NOTE — Progress Notes (Signed)
EPIC Encounter for ICM Monitoring  Patient Name: Suzanne Spears is a 57 y.o. female Date: 07/09/2017 Primary Care Physican: [name], MD Primary Cardiologist: Rockey Situ Electrophysiologist: Faustino Congress Weight:Previous weight171lbs  Bi-V Pacing: 98.5%      Attempted call to patient and unable to reach.  Left message to return call regarding transmission.  Transmission reviewed.    Thoracic impedance abnormal suggesting fluid accumulation since 06/30/2017.  Prescribed dosage: Furosemide 20 mg 1 tabletas needed. Patient takes Furosemide daily instead of PRN.Potassium 10 mEq 1 tablet daily  Labs: 05/12/2017 Creatinine 1.38, BUN 19, Potassium 4.2, Sodium 141, EGFR 42.04 03/31/2017 Creatinine 1.37, BUN 25, Potassium 3.8, Sodium 138, EGFR 42.41 11/27/2016 Creatinine 1.08, BUN 17, Potassium 4.0, Sodium 138, EGFR 57->60 03/31/2016 Creatinine 1.18, BUN 15, Potassium 3.4, Sodium 137, EGFR 51-59  Recommendations: NONE - Unable to reach.  Follow-up plan: ICM clinic phone appointment on 07/27/2017 to recheck fluid levels.    Copy of ICM check sent to Dr. Caryl Comes and Dr. Rockey Situ.   3 month ICM trend: 07/09/2017    1 Year ICM trend:       [name], RN 07/09/2017 12:07 PM

## 2017-07-09 NOTE — Progress Notes (Signed)
Subjective:  Patient ID: Suzanne Spears, female    DOB: 1960-12-30  Age: 57 y.o. MRN: [id number]  CC: The primary encounter diagnosis was CKD (chronic kidney disease) stage 3, GFR 30-59 ml/min ([place]). Diagnoses of Esophageal spasm and Pharyngoesophageal dysphagia were also pertinent to this visit.  HPI Suzanne Spears presents for follow up on chronic issues. Last seen in March CPE postponed bc of multiple pain compalints   Workup for polyarthrtitis was negative   Normal mammogram  May 2019 Had a fall after waking up .  Was trying to sit on commode and fell over sideways,  Bruised leg,  Arm etc.  bp in offie was 100/68 .  No changes to meds   Cc: fatigue.  Etiology unclear Most recent ECHO noted an EF 50 to 55% Sleep study done March 25:  No apnea,  No desats below 92%  .Marland Kitchen  Not enough REM sleep (citalopram)  averages 6 hours of sleep in bed by 9:00.  Snores   Plans to exercise constantly hindered  by mild orthopedic complaints.  Left thigh has been hurting since she stubbed her foot,  Etc..  Stopped etodolac due to ckd.  Using turmeric  Neck pain  Always on the left side,  If she coughs she will develop trouble swallowing and feels that something in her neck has popped.  Has already seen jenkins who does not see a surgical issue.    Has seen Anda Latina ,  And has had both an EGD and a barium  Swallow.  Getting more frequent.  Feels a foreign body sensation  that persists for several minutes at a time.  Painful to swallow when present.  Happening several times per week at most. Not daily brought on by coughing,  also happens in her sleep (snores) .  States that the skin on the left sie of her neck becomes taut and she has to manipulate her trachea to get the area to move back into place.  trouble speaking due to the pain but no trouble breathing .   Discussed seeing Pyrtle.     Outpatient Medications Prior to Visit  Medication Sig Dispense Refill  . albuterol (PROVENTIL) (2.5 MG/3ML)  0.083% nebulizer solution Take 3 mLs (2.5 mg total) by nebulization every 6 (six) hours as needed for wheezing or shortness of breath. 150 mL 1  . albuterol (VENTOLIN HFA) 108 (90 Base) MCG/ACT inhaler INHALE 2 PUFFS BY MOUTH INTO THE LUNGS EVERY 4 HOURS AS NEEDED FOR WHEEZING. 18 g 3  . ARNUITY ELLIPTA 100 MCG/ACT AEPB INHALE 1 PUFF BY MOUTH INTO THE LUNGS DAILY AS DIRECTED BY PHYSICIAN. 30 each 4  . Azelastine-Fluticasone 137-50 MCG/ACT SUSP Place 1 spray into the nose 2 (two) times daily. 23 g 5  . baclofen (LIORESAL) 10 MG tablet Take 10 mg by mouth 2 (two) times daily.   0  . cetirizine (ZYRTEC) 10 MG chewable tablet Chew 10 mg by mouth daily.    . citalopram (CELEXA) 20 MG tablet TAKE (1) TABLET BY MOUTH EVERY DAY 30 tablet 3  . cyanocobalamin (,VITAMIN B-12,) 1000 MCG/ML injection INJECT 1 ML INTO THE MUSCLE ONCE A WEEK 4 mL 3  . Diclofenac Potassium (CAMBIA) 50 MG PACK Take by mouth.    . DULoxetine (CYMBALTA) 60 MG capsule TAKE (1) CAPSULE BY MOUTH EVERY DAY 30 capsule 2  . etodolac (LODINE) 500 MG tablet Take 1 tablet (500 mg total) by mouth 2 (two) times daily. 60 tablet 3  .  fluticasone (FLONASE) 50 MCG/ACT nasal spray USE 2 SPRAYS INTO BOTH NOSTRILS ONCE DAILY AS DIRECTED BY PHYSICIAN. 48 g 2  . Fluticasone Furoate (ARNUITY ELLIPTA) 100 MCG/ACT AEPB Inhale 1 puff into the lungs daily. 30 each 5  . Fluticasone-Salmeterol (ADVAIR DISKUS) 250-50 MCG/DOSE AEPB Inhale 1 puff into the lungs 2 (two) times daily. 1 each 12  . furosemide (LASIX) 20 MG tablet Take 20 mg by mouth daily as needed.  10  . gabapentin (NEURONTIN) 100 MG capsule TAKE ONE (1) CAPSULE THREE (3) TIMES EACH DAY 90 capsule 1  . HYDROcodone-acetaminophen (NORCO/VICODIN) 5-325 MG tablet Take 1 tablet by mouth every 8 (eight) hours as needed for moderate pain.    Lenda Kelp FE 1/20 1-20 MG-MCG tablet TAKE (1) TABLET BY MOUTH EVERY DAY 90 tablet 1  . losartan (COZAAR) 25 MG tablet Take 1 tablet (25 mg total) by mouth daily. 90  tablet 1  . magnesium gluconate (MAGONATE) 500 MG tablet Take 500 mg by mouth 2 (two) times daily.    . montelukast (SINGULAIR) 10 MG tablet TAKE (1) TABLET BY MOUTH EVERY DAY 30 tablet 3  . pantoprazole (PROTONIX) 40 MG tablet TAKE (1) TABLET BY MOUTH EVERY DAY 30 tablet 1  . potassium chloride (K-DUR) 10 MEQ tablet TAKE (1) TABLET BY MOUTH EVERY DAY 90 tablet 2  . Topiramate ER (QUDEXY XR) 50 MG CS24 sprinkle capsule Take 100 mg by mouth daily.   2  . traMADol (ULTRAM) 50 MG tablet Take 1 tablet (50 mg total) by mouth every 6 (six) hours as needed. 60 tablet 2  . traZODone (DESYREL) 50 MG tablet TAKE 1/2 TO 1 TABLET AT BEDTIME AS NEEDED FOR SLEEP. 30 tablet 2  . TROKENDI XR 100 MG CP24 100 mg daily.   4  . valACYclovir (VALTREX) 1000 MG tablet Take 1 tablet (1,000 mg total) by mouth 2 (two) times daily as needed (fever blisters). 20 tablet 3  . magnesium 30 MG tablet Take 30 mg by mouth daily.     No facility-administered medications prior to visit.     Review of Systems;  Patient denies headache, fevers, malaise, unintentional weight loss, skin rash, eye pain, sinus congestion and sinus pain, sore throat, dysphagia,  hemoptysis , cough, dyspnea, wheezing, chest pain, palpitations, orthopnea, edema, abdominal pain, nausea, melena, diarrhea, constipation, flank pain, dysuria, hematuria, urinary  Frequency, nocturia, numbness, tingling, seizures,  Focal weakness, Loss of consciousness,  Tremor, insomnia, depression, anxiety, and suicidal ideation.      Objective:  BP 114/84 (BP Location: Left Arm, Patient Position: Sitting, Cuff Size: Normal)   Pulse 81   Temp 98.1 F (36.7 C) (Oral)   Resp 14   Ht 5' 6.25" (1.683 m)   Wt 168 lb 9.6 oz (76.5 kg)   LMP 07/12/2016   SpO2 98%   BMI 27.01 kg/m   BP Readings from Last 3 Encounters:  07/09/17 114/84  05/26/17 100/68  05/12/17 110/76    Wt Readings from Last 3 Encounters:  07/09/17 168 lb 9.6 oz (76.5 kg)  05/26/17 172 lb 6 oz  (78.2 kg)  05/12/17 170 lb 12.8 oz (77.5 kg)    General appearance: alert, cooperative and appears stated age Ears: normal TM's and external ear canals both ears Throat: lips, mucosa, and tongue normal; teeth and gums normal Neck: no adenopathy, no carotid bruit, supple, symmetrical, trachea midline and thyroid not enlarged, symmetric, no tenderness/mass/nodules Back: symmetric, no curvature. ROM normal. No CVA tenderness. Lungs: clear to auscultation  bilaterally Heart: regular rate and rhythm, S1, S2 normal, no murmur, click, rub or gallop Abdomen: soft, non-tender; bowel sounds normal; no masses,  no organomegaly Pulses: 2+ and symmetric Skin: Skin color, texture, turgor normal. No rashes or lesions Lymph nodes: Cervical, supraclavicular, and axillary nodes normal.  No results found for: HGBA1C  Lab Results  Component Value Date   CREATININE 1.35 (H) 07/09/2017   CREATININE 1.38 (H) 05/12/2017   CREATININE 1.37 (H) 03/31/2017    Lab Results  Component Value Date   WBC 8.2 11/27/2016   HGB 13.3 11/27/2016   HCT 38.8 11/27/2016   PLT 202 11/27/2016   GLUCOSE 93 07/09/2017   CHOL 236 (H) 08/17/2015   TRIG 164.0 (H) 08/17/2015   HDL 50.90 08/17/2015   LDLDIRECT 151.6 05/01/2011   LDLCALC 152 (H) 08/17/2015   ALT 12 03/31/2017   AST 17 03/31/2017   NA 138 07/09/2017   K 4.3 07/09/2017   CL 107 07/09/2017   CREATININE 1.35 (H) 07/09/2017   BUN 15 07/09/2017   CO2 25 07/09/2017   TSH 1.51 03/31/2017   INR 2.8 03/27/2015    Mm Screening Breast Tomo Bilateral  Result Date: 06/25/2017 CLINICAL DATA:  Screening. EXAM: DIGITAL SCREENING BILATERAL MAMMOGRAM WITH TOMO AND CAD COMPARISON:  Previous exam(s). ACR Breast Density Category c: The breast tissue is heterogeneously dense, which may obscure small masses. FINDINGS: There are no findings suspicious for malignancy. Images were processed with CAD. IMPRESSION: No mammographic evidence of malignancy. A result letter of this  screening mammogram will be mailed directly to the patient. RECOMMENDATION: Screening mammogram in one year. (Code:SM-B-01Y) BI-RADS CATEGORY  1: Negative. Electronically Signed   By: Ammie Ferrier M.D.   On: 06/25/2017 10:46    Assessment & Plan:   Problem List Items Addressed This Visit    Esophageal spasm    Related to cervical fusion. Workup has included an EGD,  Barium  swallow ,  MRI and ENT evaluation . Symptoms are intermittent.  Will obtain second opinion from [facility] Jarred and possible esohageal manomety       Relevant Orders   Ambulatory referral to Gastroenterology    Other Visit Diagnoses    CKD (chronic kidney disease) stage 3, GFR 30-59 ml/min ([facility])    -  Primary   Relevant Orders   Basic metabolic panel (Completed)   Pharyngoesophageal dysphagia       Relevant Orders   Ambulatory referral to Gastroenterology    A total of 40 minutes was spent with patient more than half of which was spent in counseling patient on the above mentioned issues , reviewing and explaining recent labs and imaging studies done, and coordination of care.  I have discontinued Isidoro Donning. Porada "Keane"'s magnesium. I am also having her maintain her valACYclovir, TROKENDI XR, baclofen, albuterol, ARNUITY ELLIPTA, HYDROcodone-acetaminophen, Fluticasone Furoate, Azelastine-Fluticasone, cetirizine, etodolac, traMADol, furosemide, albuterol, Fluticasone-Salmeterol, cyanocobalamin, citalopram, fluticasone, traZODone, JUNEL FE 1/20, Diclofenac Potassium, magnesium gluconate, DULoxetine, montelukast, potassium chloride, topiramate ER, losartan, gabapentin, and pantoprazole.  No orders of the defined types were placed in this encounter.   Medications Discontinued During This Encounter  Medication Reason  . magnesium 30 MG tablet Duplicate    Follow-up: Return in about 6 months (around 01/09/2018).   [name], MD

## 2017-07-09 NOTE — Progress Notes (Signed)
Patient returned call.  Reviewed transmission. She stated she has been eating out more due to Mother's day and also been busy on the weekends.  She is cutting back on sodium intake.  Current weight is 168 lbs.  Advised she is close to baseline and continue to limit salt. She inquired if it would be safe to use a hot tub.  Advised I would ask Dr Donivan Scull and Dr Aquilla Hacker nurse and will contact her with an answer.

## 2017-07-09 NOTE — Telephone Encounter (Signed)
Remote ICM transmission received.  Attempted call to patient and left message to return call regarding transmission. 

## 2017-07-11 DIAGNOSIS — K224 Dyskinesia of esophagus: Secondary | ICD-10-CM | POA: Insufficient documentation

## 2017-07-11 NOTE — Assessment & Plan Note (Signed)
Related to cervical fusion. Workup has included an EGD,  Barium  swallow ,  MRI and ENT evaluation . Symptoms are intermittent.  Will obtain second opinion from [facility] and possible esohageal manomety

## 2017-07-12 ENCOUNTER — Other Ambulatory Visit: Payer: Self-pay | Admitting: Internal Medicine

## 2017-07-13 ENCOUNTER — Ambulatory Visit: Payer: BLUE CROSS/BLUE SHIELD | Admitting: Internal Medicine

## 2017-07-13 LAB — CUP PACEART REMOTE DEVICE CHECK
Battery Remaining Longevity: 33 mo
Battery Voltage: 2.96 V
Brady Statistic AP VP Percent: 0.03 %
Brady Statistic AP VS Percent: 0.01 %
Brady Statistic AS VP Percent: 98.66 %
Brady Statistic AS VS Percent: 1.3 %
Brady Statistic RA Percent Paced: 0.04 %
Brady Statistic RV Percent Paced: 5.98 %
Date Time Interrogation Session: 20190411083823
HighPow Impedance: 82 Ohm
Implantable Lead Implant Date: 20160421
Implantable Lead Implant Date: 20160421
Implantable Lead Implant Date: 20161006
Implantable Lead Implant Date: 20161006
Implantable Lead Location: 753858
Implantable Lead Location: 753858
Implantable Lead Location: 753859
Implantable Lead Location: 753860
Implantable Lead Model: 5071
Implantable Lead Model: 5071
Implantable Lead Model: 5076
Implantable Pulse Generator Implant Date: 20160421
Lead Channel Impedance Value: 285 Ohm
Lead Channel Impedance Value: 323 Ohm
Lead Channel Impedance Value: 380 Ohm
Lead Channel Impedance Value: 380 Ohm
Lead Channel Impedance Value: 4047 Ohm
Lead Channel Impedance Value: 4047 Ohm
Lead Channel Pacing Threshold Amplitude: 0.375 V
Lead Channel Pacing Threshold Amplitude: 1.25 V
Lead Channel Pacing Threshold Pulse Width: 0.4 ms
Lead Channel Pacing Threshold Pulse Width: 0.4 ms
Lead Channel Sensing Intrinsic Amplitude: 28 mV
Lead Channel Sensing Intrinsic Amplitude: 28 mV
Lead Channel Sensing Intrinsic Amplitude: 3.125 mV
Lead Channel Sensing Intrinsic Amplitude: 3.125 mV
Lead Channel Setting Pacing Amplitude: 1.5 V
Lead Channel Setting Pacing Amplitude: 2 V
Lead Channel Setting Pacing Amplitude: 2.5 V
Lead Channel Setting Pacing Pulse Width: 0.4 ms
Lead Channel Setting Pacing Pulse Width: 0.4 ms
Lead Channel Setting Sensing Sensitivity: 0.3 mV

## 2017-07-24 ENCOUNTER — Other Ambulatory Visit: Payer: Self-pay | Admitting: Internal Medicine

## 2017-07-24 ENCOUNTER — Other Ambulatory Visit: Payer: Self-pay | Admitting: Cardiovascular Disease

## 2017-07-27 ENCOUNTER — Ambulatory Visit (INDEPENDENT_AMBULATORY_CARE_PROVIDER_SITE_OTHER): Payer: Self-pay

## 2017-07-27 DIAGNOSIS — Z95810 Presence of automatic (implantable) cardiac defibrillator: Secondary | ICD-10-CM

## 2017-07-27 DIAGNOSIS — I5032 Chronic diastolic (congestive) heart failure: Secondary | ICD-10-CM

## 2017-07-27 NOTE — Progress Notes (Signed)
EPIC Encounter for ICM Monitoring  Patient Name: Suzanne Spears is a 57 y.o. female Date: 07/27/2017 Primary Care Physican: [name], MD Primary Cardiologist: Rockey Situ Electrophysiologist: Caryl Comes Dry Weight:167lbs  Bi-V Pacing: 98.5%      Heart Failure questions reviewed, pt had 1 lb weight gain but lost it since yesterday and slight swelling of ankles.  Has been traveling lately and not eating as well as she should.   Thoracic impedance abnormal suggesting fluid accumulation since 06/30/2017.  Did return to baseline 1 day on 07/21/2017 after last ICM remote transmission on 07/09/2017.  Prescribed dosage: Furosemide 20 mg 1 tabletas needed. Patient takes Furosemide daily instead of PRN.Potassium 10 mEq 1 tablet daily  Labs: 07/09/2017 Creatinine 1.35, BUN 15, Potassium 4.3, Sodium 138, EGFR 43.10 05/12/2017 Creatinine 1.38, BUN 19, Potassium 4.2, Sodium 141, EGFR 42.04 03/31/2017 Creatinine 1.37, BUN 25, Potassium 3.8, Sodium 138, EGFR 42.41 11/27/2016 Creatinine 1.08, BUN 17, Potassium 4.0, Sodium 138, EGFR 57->60 03/31/2016 Creatinine 1.18, BUN 15, Potassium 3.4, Sodium 137, EGFR 51-59  Recommendations: Reinforced sodium restriction.  She is going to adjust diet and will recheck fluid levels.   Follow-up plan: ICM clinic phone appointment on 08/03/2017 to recheck fluid levels.   Copy of ICM check sent to Dr. Caryl Comes and Dr. Rockey Situ for review.   3 month ICM trend: 07/27/2017    1 Year ICM trend:       [name], RN 07/27/2017 11:37 AM

## 2017-08-03 ENCOUNTER — Telehealth: Payer: Self-pay

## 2017-08-03 ENCOUNTER — Ambulatory Visit (INDEPENDENT_AMBULATORY_CARE_PROVIDER_SITE_OTHER): Payer: Self-pay

## 2017-08-03 DIAGNOSIS — Z95810 Presence of automatic (implantable) cardiac defibrillator: Secondary | ICD-10-CM

## 2017-08-03 DIAGNOSIS — I5032 Chronic diastolic (congestive) heart failure: Secondary | ICD-10-CM

## 2017-08-03 MED ORDER — FUROSEMIDE 20 MG PO TABS: ORAL_TABLET | ORAL | 3 refills | Status: DC

## 2017-08-03 NOTE — Telephone Encounter (Signed)
Remote ICM transmission received.  Attempted call to patient and left message to return call. 

## 2017-08-03 NOTE — Progress Notes (Signed)
EPIC Encounter for ICM Monitoring  Patient Name: Suzanne Spears is a 57 y.o. female Date: 08/03/2017 Primary Care Physican: [name], MD Primary Cardiologist: Rockey Situ Electrophysiologist: Caryl Comes Dry Weight:166.4lbs  Bi-V Pacing: 98.6%      Heart Failure questions reviewed, pt developed cough and can tell she has fluid.    Thoracic impedance returned to normal after last ICM transmission on 6/3 but started reaccumulating on 07/31/2017.  Prescribed dosage: Furosemide 20 mg 1 tabletas needed. Patient takes Furosemide daily instead of PRN.Potassium 10 mEq 1 tablet daily  Labs: 07/09/2017 Creatinine 1.35, BUN 15, Potassium 4.3, Sodium 138, EGFR 43.10 05/12/2017 Creatinine 1.38, BUN 19, Potassium 4.2, Sodium 141, EGFR 42.04 03/31/2017 Creatinine 1.37, BUN 25, Potassium 3.8, Sodium 138, EGFR 42.41 11/27/2016 Creatinine 1.08, BUN 17, Potassium 4.0, Sodium 138, EGFR 57->60 03/31/2016 Creatinine 1.18, BUN 15, Potassium 3.4, Sodium 137, EGFR 51-59  Recommendations: Discussed with Dr Rockey Situ in office.  Recommendation to increase Lasix to 20 mg 2 tablets (40 mg total) every other day alternating with 1 tablet (20 mg total).  If fluid symptoms do not resolve then can take Lasix 40 mg daily but prefers her to try lower dosage first.  Patient said she eats a banana daily along with the Potassium 10 mEq 1 tablet daily to keep her potassium level up.    Follow-up plan: ICM clinic phone appointment on 08/25/2017.  Recall appt 08/26/2017 with Dr. Rockey Situ and 01/27/2018 with Dr Caryl Comes.  Copy of ICM check sent to Dr. Caryl Comes and Dr. Rockey Situ.   3 month ICM trend: 08/03/2017    1 Year ICM trend:       [name], RN 08/03/2017 9:50 AM

## 2017-08-13 ENCOUNTER — Telehealth: Payer: Self-pay

## 2017-08-13 ENCOUNTER — Telehealth: Payer: Self-pay | Admitting: Internal Medicine

## 2017-08-13 NOTE — Telephone Encounter (Signed)
Rec'd from [facility] forwarded 72 pages to Dr. Zenovia Jarred

## 2017-08-13 NOTE — Telephone Encounter (Signed)
Returned patient's call as requested by voice mail.  She feels the increase in Furosemide dosage has improved the fluid symptoms.  She may send a updated transmission for review this evening.  She wanted to make an appointment with Dr Rockey Situ and transferred her to front desk to make an appointment.

## 2017-08-20 ENCOUNTER — Other Ambulatory Visit: Payer: Self-pay | Admitting: Internal Medicine

## 2017-08-25 ENCOUNTER — Ambulatory Visit (INDEPENDENT_AMBULATORY_CARE_PROVIDER_SITE_OTHER): Payer: BLUE CROSS/BLUE SHIELD

## 2017-08-25 DIAGNOSIS — I5032 Chronic diastolic (congestive) heart failure: Secondary | ICD-10-CM | POA: Diagnosis not present

## 2017-08-25 DIAGNOSIS — Z95810 Presence of automatic (implantable) cardiac defibrillator: Secondary | ICD-10-CM

## 2017-08-25 NOTE — Progress Notes (Signed)
EPIC Encounter for ICM Monitoring  Patient Name: Suzanne Spears is a 57 y.o. female Date: 08/25/2017 Primary Care Physican: [name], MD Primary Cardiologist: Rockey Situ Electrophysiologist: Caryl Comes Dry Weight:164lbs  Bi-V Pacing: 98.4%       Heart Failure questions reviewed, pt asymptomatic.   Thoracic impedance normal.  Prescribed dosage: Furosemide 20 mg Take 2 tablets (40 mg total) every other day alternating with 1 tablet (20 mg total) every other day.Potassium 10 mEq 1 tablet daily  Labs: 07/09/2017 Creatinine 1.35, BUN 15, Potassium 4.3, Sodium 138, EGFR 43.10 05/12/2017 Creatinine 1.38, BUN 19, Potassium 4.2, Sodium 141, EGFR 42.04 03/31/2017 Creatinine 1.37, BUN 25, Potassium 3.8, Sodium 138, EGFR 42.41 11/27/2016 Creatinine 1.08, BUN 17, Potassium 4.0, Sodium 138, EGFR 57->60 03/31/2016 Creatinine 1.18, BUN 15, Potassium 3.4, Sodium 137, EGFR 51-59  Recommendations: No changes.  Encouraged to call for fluid symptoms.  Follow-up plan: ICM clinic phone appointment on 09/25/2017.    Copy of ICM check sent to Dr. Caryl Comes.   3 month ICM trend: 08/25/2017    1 Year ICM trend:       [name], RN 08/25/2017 5:12 PM

## 2017-09-02 ENCOUNTER — Telehealth: Payer: Self-pay | Admitting: Internal Medicine

## 2017-09-02 NOTE — Telephone Encounter (Signed)
Hi Dr. Hilarie Fredrickson, we have received a referral from pt's PCP for evaluation of dysphagia. Previous GI records have been received and placed on your desk for review. Thank you

## 2017-09-03 ENCOUNTER — Ambulatory Visit (INDEPENDENT_AMBULATORY_CARE_PROVIDER_SITE_OTHER): Payer: BLUE CROSS/BLUE SHIELD | Admitting: *Deleted

## 2017-09-03 DIAGNOSIS — I255 Ischemic cardiomyopathy: Secondary | ICD-10-CM

## 2017-09-03 NOTE — Progress Notes (Signed)
Remote ICD transmission.   

## 2017-09-14 NOTE — Telephone Encounter (Signed)
Dr. Hilarie Fredrickson reviewed records and okay to schedule colon.  Called and LM on Vmail to call back

## 2017-09-16 NOTE — Progress Notes (Signed)
Cardiology Office Note  Date:  09/17/2017   ID:  Suzanne Spears, DOB Jul 16, 1960, MRN [id number]  PCP:  Suzanne Mc, MD   Chief Complaint  Patient presents with  . other    12 month follow up. Meds reviewed by the pt. verbally. Pt. c/o shortness of breath & LE edema.     HPI:  Suzanne Spears is a 57 y.o. female with PMH of nonischemic cardiomyopathyEF 25% in 01/2015, up to 55% 08/2015 Left bundle branch block with a QRS duration 125-130 milliseconds  CRT-D implanted by Dr. Lovena Le 4/16  suffered lead dislodgment and underwent epicardial lead placement.  postoperative course was complicated by pulmonary embolism.  BB intolerant, fatigue and dyspnea,  12/17  CPX-- submaximal effort but elevated VE/VCO2 slope suggested " least mild to moderate circulatory limitation"   Cardiac rehab recommended she has started to Exercise but she hurt herself running, knee and ankle She presents today for follow-up of her cardiomyopathy  In follow-up reports feeling fatigued, long work hours, the time for regular exercise Lots of sitting at work Having lots of leg issues which she attributes to prolonged sitting  Long discussion concerning her renal function and diuretics Continues to take Lasix to 1 day alternating with 1  some days with poor fluid intake  Long history of Chronic neck pain, s/p surgery, followed by Dr. Arnoldo Morale There are chronic leg issues  Chest CT scan from 2016  No significant carotid, aortic atherosclerosis, no coronary calcifications Problems with statins  in the past  EKG personally reviewed by myself on todays visit Shows atrial sensed ventricularly paced rhythm rate 76 bpm   PMH:   has a past medical history of AICD (automatic cardioverter/defibrillator) present, Allergy, Anemia, Asthma, Cardiomyopathy, dilated, nonischemic ([place]), Chronic systolic (congestive) heart failure (Suzanne Spears), Cough, Depression, Dizziness, GERD (gastroesophageal reflux disease),  Headache(784.0), Heart murmur, Hip pain, right (200), History of bronchitis, History of shingles, Hyperlipidemia, Hypertension, Left bundle branch block (2008), Migraine, Mitral valve prolapse syndrome, Pericarditis (2008), Peripheral neuropathy, Pneumonia (2016), Presence of permanent cardiac pacemaker, Shortness of breath dyspnea, and Spinal headache.  PSH:    Past Surgical History:  Procedure Laterality Date  . ANTERIOR CERVICAL DECOMP/DISCECTOMY FUSION N/A 10/21/2012   Procedure: ANTERIOR CERVICAL DECOMPRESSION/DISCECTOMY FUSION 2 LEVELS;  Surgeon: Ophelia Charter, MD;  Location: [facility];  Service: Neurosurgery;  Laterality: N/A;  C56 C67 anterior cervical decompression with fusion interbody prothesis plating and bonegraft  . APPENDECTOMY  2009   for appendicitis, , [place]  . BI-VENTRICULAR IMPLANTABLE CARDIOVERTER DEFIBRILLATOR N/A 06/15/2014   MDT CRTD implanted by Dr Lovena Le  . blood clot removed from left top hand    . CARDIAC CATHETERIZATION  01/13/05/2015   normal coronaries, EF 50%  . CARDIAC CATHETERIZATION    . CESAREAN SECTION     x 2  . COLONOSCOPY     Hx: of  . EPICARDIAL PACING LEAD PLACEMENT N/A 11/30/2014   Procedure: EPICARDIAL PACING LEAD PLACEMENT;  Surgeon: Gaye Pollack, MD;  Location: [facility] OR;  Service: Thoracic;  Laterality: N/A;  . ESOPHAGOGASTRODUODENOSCOPY (EGD) WITH PROPOFOL N/A 04/07/2017   Procedure: ESOPHAGOGASTRODUODENOSCOPY (EGD) WITH PROPOFOL;  Surgeon: Manya Silvas, MD;  Location: [facility];  Service: Endoscopy;  Laterality: N/A;  . ganglionic cyst  remote   right wrist  . tendon release surgery Left   . THORACOTOMY Left 11/30/2014   Procedure: THORACOTOMY MAJOR;  Surgeon: Gaye Pollack, MD;  Location: [facility] OR;  Service: Thoracic;  Laterality: Left;  .  TONSILLECTOMY    . turbinectomy  2009   [place]    Current Outpatient Medications  Medication Sig Dispense Refill  . albuterol (PROVENTIL) (2.5 MG/3ML) 0.083% nebulizer solution Take 3  mLs (2.5 mg total) by nebulization every 6 (six) hours as needed for wheezing or shortness of breath. 150 mL 1  . albuterol (VENTOLIN HFA) 108 (90 Base) MCG/ACT inhaler INHALE 2 PUFFS BY MOUTH INTO THE LUNGS EVERY 4 HOURS AS NEEDED FOR WHEEZING. 18 g 3  . ARNUITY ELLIPTA 100 MCG/ACT AEPB INHALE 1 PUFF BY MOUTH INTO THE LUNGS DAILY AS DIRECTED BY PHYSICIAN. 30 each 4  . Azelastine-Fluticasone 137-50 MCG/ACT SUSP Place 1 spray into the nose 2 (two) times daily. 23 g 5  . baclofen (LIORESAL) 10 MG tablet Take 10 mg by mouth 2 (two) times daily.   0  . cetirizine (ZYRTEC) 10 MG chewable tablet Chew 10 mg by mouth daily.    . citalopram (CELEXA) 20 MG tablet TAKE ONE (1) TABLET BY MOUTH ONCE DAILY 90 tablet 1  . cyanocobalamin (,VITAMIN B-12,) 1000 MCG/ML injection INJECT 1 ML INTO THE MUSCLE ONCE A WEEK 4 mL 3  . DULoxetine (CYMBALTA) 60 MG capsule TAKE ONE (1) CAPSULE EACH DAY. 30 capsule 2  . fluticasone (FLONASE) 50 MCG/ACT nasal spray USE 2 SPRAYS INTO BOTH NOSTRILS ONCE DAILY AS DIRECTED BY PHYSICIAN. 48 g 2  . Fluticasone Furoate (ARNUITY ELLIPTA) 100 MCG/ACT AEPB Inhale 1 puff into the lungs daily. 30 each 5  . Fluticasone-Salmeterol (ADVAIR DISKUS) 250-50 MCG/DOSE AEPB Inhale 1 puff into the lungs 2 (two) times daily. 1 each 12  . furosemide (LASIX) 20 MG tablet Take 2 tablets (40 mg total) every other day alternating with 1 tablet (20 mg total) every other day. 135 tablet 3  . gabapentin (NEURONTIN) 100 MG capsule TAKE ONE (1) CAPSULE THREE (3) TIMES EACH DAY 90 capsule 1  . HYDROcodone-acetaminophen (NORCO/VICODIN) 5-325 MG tablet Take 1 tablet by mouth every 8 (eight) hours as needed for moderate pain.    Suzanne Spears FE 1/20 1-20 MG-MCG tablet TAKE (1) TABLET BY MOUTH EVERY DAY 90 tablet 1  . losartan (COZAAR) 25 MG tablet Take 1 tablet (25 mg total) by mouth daily. 90 tablet 1  . magnesium gluconate (MAGONATE) 500 MG tablet Take 500 mg by mouth 2 (two) times daily.    . montelukast (SINGULAIR)  10 MG tablet TAKE (1) TABLET BY MOUTH EVERY DAY 30 tablet 3  . pantoprazole (PROTONIX) 40 MG tablet TAKE (1) TABLET BY MOUTH EVERY DAY 30 tablet 1  . potassium chloride (K-DUR) 10 MEQ tablet TAKE (1) TABLET BY MOUTH EVERY DAY 90 tablet 2  . Topiramate ER (QUDEXY XR) 50 MG CS24 sprinkle capsule Take 100 mg by mouth daily.   2  . traMADol (ULTRAM) 50 MG tablet Take 1 tablet (50 mg total) by mouth every 6 (six) hours as needed. 60 tablet 2  . traZODone (DESYREL) 50 MG tablet TAKE 1/2 TO 1 TABLET AT BEDTIME AS NEEDED FOR SLEEP 90 tablet 1  . TROKENDI XR 100 MG CP24 100 mg daily.   4  . valACYclovir (VALTREX) 1000 MG tablet Take 1 tablet (1,000 mg total) by mouth 2 (two) times daily as needed (fever blisters). 20 tablet 3   No current facility-administered medications for this visit.      Allergies:   Adhesive [tape]; Coreg [carvedilol]; Metoprolol; Penicillin g; Lisinopril; Prednisone; Latex; and Levofloxacin   Social History:  The patient  reports that  she has never smoked. She has never used smokeless tobacco. She reports that she drinks alcohol. She reports that she does not use drugs.   Family History:   family history includes Breast cancer (age of onset: 70) in her maternal grandmother; Cancer in her paternal grandfather; Cancer (age of onset: 68) in her mother; Cancer (age of onset: 65) in her maternal grandmother; Diabetes in her maternal grandmother; Pneumonia in her father; Supraventricular tachycardia in her daughter.    Review of Systems: Review of Systems  Constitutional: Negative.   Respiratory: Negative.   Cardiovascular: Negative.   Gastrointestinal: Negative.   Musculoskeletal: Positive for joint pain and myalgias.  Neurological: Negative.   Psychiatric/Behavioral: Negative.   All other systems reviewed and are negative.    PHYSICAL EXAM: VS:  BP 110/70 (BP Location: Left Arm, Patient Position: Sitting, Cuff Size: Normal)   Pulse 76   Ht 5' 5.5" (1.664 m)   Wt 166 lb  12 oz (75.6 kg)   LMP 07/12/2016   BMI 27.33 kg/m  , BMI Body mass index is 27.33 kg/m. Constitutional:  oriented to person, place, and time. No distress.  HENT:  Head: Normocephalic and atraumatic.  Eyes:  no discharge. No scleral icterus.  Neck: Normal range of motion. Neck supple. No JVD present.  Cardiovascular: Normal rate, regular rhythm, normal heart sounds and intact distal pulses. Exam reveals no gallop and no friction rub. No edema No murmur heard. Pulmonary/Chest: Effort normal and breath sounds normal. No stridor. No respiratory distress.  no wheezes.  no rales.  no tenderness.  Abdominal: Soft.  no distension.  no tenderness.  Musculoskeletal: Normal range of motion.  no  tenderness or deformity.  Neurological:  normal muscle tone. Coordination normal. No atrophy Skin: Skin is warm and dry. No rash noted. not diaphoretic.  Psychiatric:  normal mood and affect. behavior is normal. Thought content normal.   Recent Labs: 11/27/2016: B Natriuretic Peptide 172.0; Hemoglobin 13.3; Magnesium 2.0; Platelets 202 03/31/2017: ALT 12; TSH 1.51 07/09/2017: BUN 15; Creatinine, Ser 1.35; Potassium 4.3; Sodium 138    Lipid Panel Lab Results  Component Value Date   CHOL 236 (H) 08/17/2015   HDL 50.90 08/17/2015   LDLCALC 152 (H) 08/17/2015   TRIG 164.0 (H) 08/17/2015      Wt Readings from Last 3 Encounters:  09/17/17 166 lb 12 oz (75.6 kg)  07/09/17 168 lb 9.6 oz (76.5 kg)  05/26/17 172 lb 6 oz (78.2 kg)       ASSESSMENT AND PLAN:  Cardiomyopathy, dilated, nonischemic ([place]) - Plan: EKG 12-Lead Most recent echocardiogram ejection fraction 44-03% Grade 2 diastolic dysfunction Mild to moderate MR  Localized edema - Plan: EKG 12-Lead  No significant lower extremity edema Slowly creatinines trending upwards concerning for prerenal state Recommended she take Lasix 20 g daily with extra Lasix only as needed for weight gain or ankle swelling.  Chronic diastolic CHF (congestive  heart failure) ([place]) - Plan: EKG 12-Lead Mild change to Lasix, decrease her dosing down to Lasix 20 daily Extra dosing only as needed for weight gain or swelling or abdominal bloating Concern for prerenal state creatinine 1.3, trending up this year  Chronic fatigue - Plan: EKG 12-Lead Long hours at work, scheduled for vacation in the next several weeks  S/P ICD (internal cardiac defibrillator) procedure - Plan: EKG 12-Lead Followed by Dr. Caryl Comes Epicardial lead placement   Total encounter time more than 25 minutes  Greater than 50% was spent in counseling and coordination of  care with the patient   Disposition:   F/U  12 months   Orders Placed This Encounter  Procedures  . EKG 12-Lead     Signed, Esmond Plants, M.D., Ph.D. 09/17/2017  [place], [place]

## 2017-09-17 ENCOUNTER — Encounter: Payer: Self-pay | Admitting: Cardiovascular Disease

## 2017-09-17 ENCOUNTER — Ambulatory Visit: Payer: BLUE CROSS/BLUE SHIELD | Admitting: Cardiovascular Disease

## 2017-09-17 VITALS — BP 110/70 | HR 76 | Ht 65.5 in | Wt 166.8 lb

## 2017-09-17 DIAGNOSIS — I5022 Chronic systolic (congestive) heart failure: Secondary | ICD-10-CM

## 2017-09-17 DIAGNOSIS — R600 Localized edema: Secondary | ICD-10-CM | POA: Diagnosis not present

## 2017-09-17 DIAGNOSIS — I420 Dilated cardiomyopathy: Secondary | ICD-10-CM

## 2017-09-17 DIAGNOSIS — I2699 Other pulmonary embolism without acute cor pulmonale: Secondary | ICD-10-CM | POA: Diagnosis not present

## 2017-09-17 NOTE — Patient Instructions (Signed)
Medication Instructions:   No medication changes made  Consider decreasing the lasix down to one a day,extra pill as needed for weight gain, leg swelling  Labwork:  No new labs needed  Testing/Procedures:  No further testing at this time   Follow-Up: It was a pleasure seeing you in the office today. Please call us if you have new issues that need to be addressed before your next appt.  [phone]  Your physician wants you to follow-up in: 12 months.  You will receive a reminder letter in the mail two months in advance. If you don't receive a letter, please call our office to schedule the follow-up appointment.  If you need a refill on your cardiac medications before your next appointment, please call your pharmacy.  For educational health videos Log in to : [email or web address] Or : [email or web address], password : [email or web address]

## 2017-09-21 ENCOUNTER — Other Ambulatory Visit: Payer: Self-pay | Admitting: Internal Medicine

## 2017-09-22 ENCOUNTER — Other Ambulatory Visit: Payer: Self-pay

## 2017-09-22 DIAGNOSIS — I429 Cardiomyopathy, unspecified: Secondary | ICD-10-CM

## 2017-09-22 MED ORDER — LOSARTAN POTASSIUM 25 MG PO TABS: 25.0000 mg | ORAL_TABLET | Freq: Every day | ORAL | 1 refills | Status: DC

## 2017-09-25 ENCOUNTER — Ambulatory Visit (INDEPENDENT_AMBULATORY_CARE_PROVIDER_SITE_OTHER): Payer: BLUE CROSS/BLUE SHIELD

## 2017-09-25 DIAGNOSIS — I5022 Chronic systolic (congestive) heart failure: Secondary | ICD-10-CM | POA: Diagnosis not present

## 2017-09-25 DIAGNOSIS — Z95810 Presence of automatic (implantable) cardiac defibrillator: Secondary | ICD-10-CM

## 2017-09-25 NOTE — Progress Notes (Signed)
EPIC Encounter for ICM Monitoring  Patient Name: Suzanne Spears is a 57 y.o. female Date: 09/25/2017 Primary Care Physican: [name], MD Primary Cardiologist: Rockey Situ Electrophysiologist: Caryl Comes Dry Weight: 166lbs  Bi-V Pacing: 98.5%                                            Heart Failure questions reviewed, pt symptomatic today with swelling of her feet.  She reported Dr Rockey Situ instructed her to take Furosemide PRN due because of decreased kidney funtion.  She is only taking as needed.      Thoracic impedance normal but has pattern impedance below baseline for several days alternating with at baseline for 1-2 days.         Prescribed dosage: Furosemide 20 mg take 1 tablet (20 mg total) daily. Extra dosing only as needed for weight gain or swelling or abdominal bloating (per Dr Donivan Scull OV note 09/17/2017).  Potassium 10 mEq 1 tablet daily  Labs: 07/09/2017 Creatinine 1.35, BUN 15, Potassium 4.3, Sodium 138, EGFR 43.10 05/12/2017 Creatinine 1.38, BUN 19, Potassium 4.2, Sodium 141, EGFR 42.04 03/31/2017 Creatinine 1.37, BUN 25, Potassium 3.8, Sodium 138, EGFR 42.41 11/27/2016 Creatinine 1.08, BUN 17, Potassium 4.0, Sodium 138, EGFR 57->60 03/31/2016 Creatinine 1.18, BUN 15, Potassium 3.4, Sodium 137, EGFR 51-59  Recommendations:   Encouraged to call for fluid symptoms.  Follow-up plan: ICM clinic phone appointment on 10/27/2017.    Copy of ICM check sent to Dr. Caryl Comes and Dr Rockey Situ.   3 month ICM trend: 09/25/2017    1 Year ICM trend:       [name], RN 09/25/2017 11:04 AM

## 2017-09-30 NOTE — Telephone Encounter (Signed)
Records will be in "records reviewed" folder.

## 2017-10-04 LAB — CUP PACEART REMOTE DEVICE CHECK
Battery Remaining Longevity: 27 mo
Battery Voltage: 2.95 V
Brady Statistic AP VP Percent: 0.03 %
Brady Statistic AP VS Percent: 0.01 %
Brady Statistic AS VP Percent: 98.41 %
Brady Statistic AS VS Percent: 1.55 %
Brady Statistic RA Percent Paced: 0.04 %
Brady Statistic RV Percent Paced: 4.82 %
Date Time Interrogation Session: 20190711071706
HighPow Impedance: 86 Ohm
Implantable Lead Implant Date: 20160421
Implantable Lead Implant Date: 20160421
Implantable Lead Implant Date: 20161006
Implantable Lead Implant Date: 20161006
Implantable Lead Location: 753858
Implantable Lead Location: 753858
Implantable Lead Location: 753859
Implantable Lead Location: 753860
Implantable Lead Model: 5071
Implantable Lead Model: 5071
Implantable Lead Model: 5076
Implantable Pulse Generator Implant Date: 20160421
Lead Channel Impedance Value: 285 Ohm
Lead Channel Impedance Value: 285 Ohm
Lead Channel Impedance Value: 380 Ohm
Lead Channel Impedance Value: 399 Ohm
Lead Channel Impedance Value: 4047 Ohm
Lead Channel Impedance Value: 4047 Ohm
Lead Channel Pacing Threshold Amplitude: 0.5 V
Lead Channel Pacing Threshold Amplitude: 1.25 V
Lead Channel Pacing Threshold Pulse Width: 0.4 ms
Lead Channel Pacing Threshold Pulse Width: 0.4 ms
Lead Channel Sensing Intrinsic Amplitude: 31.625 mV
Lead Channel Sensing Intrinsic Amplitude: 31.625 mV
Lead Channel Sensing Intrinsic Amplitude: 4.125 mV
Lead Channel Sensing Intrinsic Amplitude: 4.125 mV
Lead Channel Setting Pacing Amplitude: 1.5 V
Lead Channel Setting Pacing Amplitude: 2 V
Lead Channel Setting Pacing Amplitude: 2.5 V
Lead Channel Setting Pacing Pulse Width: 0.4 ms
Lead Channel Setting Pacing Pulse Width: 0.4 ms
Lead Channel Setting Sensing Sensitivity: 0.3 mV

## 2017-10-15 ENCOUNTER — Other Ambulatory Visit: Payer: Self-pay | Admitting: Internal Medicine

## 2017-10-16 ENCOUNTER — Ambulatory Visit: Payer: BLUE CROSS/BLUE SHIELD | Admitting: Internal Medicine

## 2017-10-27 ENCOUNTER — Ambulatory Visit (INDEPENDENT_AMBULATORY_CARE_PROVIDER_SITE_OTHER): Payer: BLUE CROSS/BLUE SHIELD

## 2017-10-27 DIAGNOSIS — I5022 Chronic systolic (congestive) heart failure: Secondary | ICD-10-CM

## 2017-10-27 DIAGNOSIS — Z95810 Presence of automatic (implantable) cardiac defibrillator: Secondary | ICD-10-CM | POA: Diagnosis not present

## 2017-10-27 NOTE — Progress Notes (Signed)
EPIC Encounter for ICM Monitoring  Patient Name: Suzanne Spears is a 57 y.o. female Date: 10/27/2017 Primary Care Physican: [name], MD Primary Cardiologist: Rockey Situ Electrophysiologist: Caryl Comes Dry Weight: Previous weight 166lbs  Bi-V Pacing: 98.5%  Heart Failure questions reviewed, pt asymptomatic.  Patient has been on vacation for the last 10 days   Thoracic impedance abnormal suggesting fluid accumulation starting 09/22/2017.  Fluid index > normal threshold.  Prescribed dosage: Furosemide 20 mgtake 1 tablet (20 mg total) daily.  Patient is taking Furosemide PRN and not daily.  Extra dosing only as needed for weight gain or swelling or abdominal bloating (per Dr Donivan Scull OV note 09/17/2017).  Potassium 10 mEq 1 tablet daily  Labs: 07/09/2017 Creatinine 1.35, BUN 15, Potassium 4.3, Sodium 138, EGFR 43.10 05/12/2017 Creatinine 1.38, BUN 19, Potassium 4.2, Sodium 141, EGFR 42.04 03/31/2017 Creatinine 1.37, BUN 25, Potassium 3.8, Sodium 138, EGFR 42.41 11/27/2016 Creatinine 1.08, BUN 17, Potassium 4.0, Sodium 138, EGFR 57->60 03/31/2016 Creatinine 1.18, BUN 15, Potassium 3.4, Sodium 137, EGFR 51-59  Recommendations: Advised to take Furosemide 20 mg 1 tablet x 3 days and return to prescribed dosage.  Follow-up plan: ICM clinic phone appointment on 11/03/2017 to recheck fluid levels.      Copy of ICM check sent to Dr. Rockey Situ and Dr Caryl Comes.   3 month ICM trend: 10/27/2017    1 Year ICM trend:       [name], RN 10/27/2017 9:49 AM

## 2017-10-29 ENCOUNTER — Ambulatory Visit: Payer: BLUE CROSS/BLUE SHIELD | Admitting: Internal Medicine

## 2017-10-29 ENCOUNTER — Encounter: Payer: Self-pay | Admitting: Internal Medicine

## 2017-10-29 VITALS — BP 120/78 | HR 83 | Resp 16 | Ht 65.5 in | Wt 169.0 lb

## 2017-10-29 DIAGNOSIS — J4520 Mild intermittent asthma, uncomplicated: Secondary | ICD-10-CM | POA: Diagnosis not present

## 2017-10-29 DIAGNOSIS — Z23 Encounter for immunization: Secondary | ICD-10-CM | POA: Diagnosis not present

## 2017-10-29 NOTE — Progress Notes (Signed)
[facility] Consultation      MRN# [id number] Suzanne Spears [birth year]   CC: Follow up ASTHMA    Brief History: 57 year old female seen in consultation as transition of care from Dr. Raul Del office for mild to moderate asthma, previously saw [facility], then transferred to [facility], now back to [facility]. Medical history significant for left bundle branch block, mitral prolapse, dilated cardiomyopathy, asthma, hypertension, chronic systolic congestive heart failure, status post ICD placement, hx of pneumonia, which are all stable. Work allergens may aggravate cough, follow with ENT for nasal septum issues.   Events since last clinic visit: follow-up visit of mild intermittent asthma. On  Arnuity, and since then has had significant improvement in her cough and overall breathing. Using albuterol as needed Denies SOB/wheezing  I have discussed SLeep study repoprts-No evidence of OSA Patient still with daytime sleepiness most likely related to antipsychotic meds  Also has untreated GERD symptoms-on PPI  Overall with significant improvement since starting ICS. No fevers, no chills, no nausea, no vomiting, no diarrhea, no worsening dyspnea on exertion.   Has significant h/o Cardiomyopathy sees Dr. Caryl Comes No lower ext swelling No signs of infection at this time Takes lasix as needed EF 25%  Her allergic rhinitis does seem to be under control   No signs of infection and no signs of acute CHF at this time  Medication:    Current Outpatient Medications:  .  albuterol (PROVENTIL) (2.5 MG/3ML) 0.083% nebulizer solution, Take 3 mLs (2.5 mg total) by nebulization every 6 (six) hours as needed for wheezing or shortness of breath., Disp: 150 mL, Rfl: 1 .  albuterol (VENTOLIN HFA) 108 (90 Base) MCG/ACT inhaler, INHALE 2 PUFFS BY MOUTH INTO THE LUNGS EVERY 4 HOURS AS NEEDED FOR WHEEZING., Disp: 18 g, Rfl: 3 .  ARNUITY ELLIPTA 100 MCG/ACT AEPB, INHALE 1 PUFF BY MOUTH INTO THE  LUNGS DAILY AS DIRECTED BY PHYSICIAN., Disp: 30 each, Rfl: 4 .  Azelastine-Fluticasone 137-50 MCG/ACT SUSP, Place 1 spray into the nose 2 (two) times daily., Disp: 23 g, Rfl: 5 .  baclofen (LIORESAL) 10 MG tablet, Take 10 mg by mouth 2 (two) times daily. , Disp: , Rfl: 0 .  cetirizine (ZYRTEC) 10 MG chewable tablet, Chew 10 mg by mouth daily., Disp: , Rfl:  .  citalopram (CELEXA) 20 MG tablet, TAKE ONE (1) TABLET BY MOUTH ONCE DAILY, Disp: 90 tablet, Rfl: 1 .  cyanocobalamin (,VITAMIN B-12,) 1000 MCG/ML injection, INJECT 1 ML INTO THE MUSCLE ONCE A WEEK, Disp: 4 mL, Rfl: 3 .  DULoxetine (CYMBALTA) 60 MG capsule, TAKE ONE (1) CAPSULE EACH DAY., Disp: 30 capsule, Rfl: 2 .  fluticasone (FLONASE) 50 MCG/ACT nasal spray, USE 2 SPRAYS INTO BOTH NOSTRILS ONCE DAILY AS DIRECTED BY PHYSICIAN., Disp: 48 g, Rfl: 2 .  furosemide (LASIX) 20 MG tablet, Take 2 tablets (40 mg total) every other day alternating with 1 tablet (20 mg total) every other day. (Patient taking differently: Take 2 tablets (40 mg total) every other day alternating with 1 tablet (20 mg total) every other day. 10/27/17 Patient report taking 20 mg PRN), Disp: 135 tablet, Rfl: 3 .  gabapentin (NEURONTIN) 100 MG capsule, TAKE ONE (1) CAPSULE THREE (3) TIMES EACH DAY, Disp: 90 capsule, Rfl: 1 .  HYDROcodone-acetaminophen (NORCO/VICODIN) 5-325 MG tablet, Take 1 tablet by mouth every 8 (eight) hours as needed for moderate pain., Disp: , Rfl:  .  JUNEL FE 1/20 1-20 MG-MCG tablet, TAKE (1) TABLET BY MOUTH EVERY  DAY, Disp: 90 tablet, Rfl: 0 .  losartan (COZAAR) 25 MG tablet, Take 1 tablet (25 mg total) by mouth daily., Disp: 90 tablet, Rfl: 1 .  magnesium gluconate (MAGONATE) 500 MG tablet, Take 500 mg by mouth 2 (two) times daily., Disp: , Rfl:  .  montelukast (SINGULAIR) 10 MG tablet, TAKE ONE (1) TABLET BY MOUTH ONCE DAILY, Disp: 30 tablet, Rfl: 2 .  pantoprazole (PROTONIX) 40 MG tablet, TAKE ONE (1) TABLET BY MOUTH ONCE DAILY, Disp: 30 tablet, Rfl:  1 .  potassium chloride (K-DUR) 10 MEQ tablet, TAKE (1) TABLET BY MOUTH EVERY DAY, Disp: 90 tablet, Rfl: 2 .  Topiramate ER (QUDEXY XR) 50 MG CS24 sprinkle capsule, Take 100 mg by mouth daily. , Disp: , Rfl: 2 .  traMADol (ULTRAM) 50 MG tablet, Take 1 tablet (50 mg total) by mouth every 6 (six) hours as needed., Disp: 60 tablet, Rfl: 2 .  traZODone (DESYREL) 50 MG tablet, TAKE 1/2 TO 1 TABLET AT BEDTIME AS NEEDED FOR SLEEP, Disp: 90 tablet, Rfl: 1 .  TROKENDI XR 100 MG CP24, 100 mg daily. , Disp: , Rfl: 4 .  valACYclovir (VALTREX) 1000 MG tablet, Take 1 tablet (1,000 mg total) by mouth 2 (two) times daily as needed (fever blisters)., Disp: 20 tablet, Rfl: 3    Review of Systems  Constitutional: Negative for chills and fever.  HENT: Negative for congestion.   Eyes: Negative for blurred vision and double vision.  Respiratory: Positive for cough. Negative for sputum production, shortness of breath and wheezing.        Mild intermittent cough  Cardiovascular: Negative for chest pain.  Gastrointestinal: Negative for heartburn and nausea.  Genitourinary: Negative for dysuria.  Musculoskeletal: Negative for myalgias.  Neurological: Negative for dizziness and headaches.  Psychiatric/Behavioral: Nervous/anxious: .vs.       Allergies:  Adhesive [tape]; Coreg [carvedilol]; Metoprolol; Penicillin g; Lisinopril; Prednisone; Latex; and Levofloxacin  Physical Examination:  BP 120/78 (BP Location: Left Arm, Cuff Size: Normal)   Pulse 83   Resp 16   Ht 5' 5.5" (1.664 m)   Wt 169 lb (76.7 kg)   LMP 07/12/2016   SpO2 97%   BMI 27.70 kg/m    General Appearance: No distress  HEENT: PERRLA, no ptosis, no other lesions noticed Pulmonary:normal breath sounds., diaphragmatic excursion normal.No wheezing, No rales   Cardiovascular:  Normal S1,S2.  No m/r/g.     Abdomen:Exam: Benign, Soft, non-tender, No masses  Skin:   warm, no rashes, no ecchymosis  Extremities: normal, no cyanosis, clubbing,  warm with normal capillary refill.     07/09/15 PFTs - FEV1 80, FEV1/FVC 72, FEF 25-70 562, DLCO 91%. No significant obstruction,recurrent bronchodilator response, DLCO uncorrected within normal limits, normal curves. 6 minute walk test-no significant desaturations, lowest desaturation 96%, 1181 feet, 360 m    Assessment and Plan:  57 year old seen in follow-up for asthma-mild intemittent, well controlled at this time  Asthma, chronic Continue Arnuity as prescribed  albuterol as needed .  Pfts are essentially normal, with moderate decrease in FEF 25-75 62% (FEV1 80%, FEV1/FVC 72%) Normal 71mwt - no desats, walked 1149ft/360m   she claims that she has allergies to prednisone, but her descriptions are more in line with side effects of high-dose steroids.  I have discussed with her in the future if she does need prednisone, we can start off at lower dose, she will consider this when that time comes or may consider decadron  Plan: -Arnuity -gargle and rinse after  each use.  -Avoid noxious substances such as smoke, perfumes, and other sick contacts. -Continue with  allergy regimen   Allerghic Rhinitis-under control - zyrtec - flonase  GI-s/p esoph dilatation Follow up GI as needed  CArdiology-h/o CAD and s/p pacemaker Patient needs to follow up with cardiology if symptoms persist Lasix as needed and tolerated  GERD -started protonix    Follow up in 6 months  Patient satisfied with Plan of action and management. All questions answered  Corrin Parker, M.D.  [facility]  Medical Director [place] Director [facility] Cardio-Pulmonary Department

## 2017-10-29 NOTE — Patient Instructions (Signed)
Continue inhalers as needed Avoid triggers

## 2017-11-03 ENCOUNTER — Ambulatory Visit (INDEPENDENT_AMBULATORY_CARE_PROVIDER_SITE_OTHER): Payer: BLUE CROSS/BLUE SHIELD

## 2017-11-03 DIAGNOSIS — I5022 Chronic systolic (congestive) heart failure: Secondary | ICD-10-CM

## 2017-11-03 DIAGNOSIS — Z95810 Presence of automatic (implantable) cardiac defibrillator: Secondary | ICD-10-CM

## 2017-11-03 NOTE — Progress Notes (Signed)
EPIC Encounter for ICM Monitoring  Patient Name: Suzanne Spears is a 57 y.o. female Date: 11/03/2017 Primary Care Physican: [name], MD Primary Cardiologist: Rockey Situ Electrophysiologist: Caryl Comes Dry Weight: 165lbs  Bi-V Pacing: 98.5%       Heart Failure questions reviewed, pt asymptomatic.   Thoracic impedance returned to normal after taking Furosemide daily x 3 days instead of PRN.  Prescribed dosage: Furosemide 20 mgtake 1tablet (20 mg total)daily. Patient is taking Furosemide PRN and not daily.  Extra dosing only as needed for weight gain or swelling or abdominal bloating(per Dr Donivan Scull OV note 09/17/2017).Potassium 10 mEq 1 tablet daily  Labs: 07/09/2017 Creatinine 1.35, BUN 15, Potassium 4.3, Sodium 138, EGFR 43.10 05/12/2017 Creatinine 1.38, BUN 19, Potassium 4.2, Sodium 141, EGFR 42.04 03/31/2017 Creatinine 1.37, BUN 25, Potassium 3.8, Sodium 138, EGFR 42.41 11/27/2016 Creatinine 1.08, BUN 17, Potassium 4.0, Sodium 138, EGFR 57->60 03/31/2016 Creatinine 1.18, BUN 15, Potassium 3.4, Sodium 137, EGFR 51-59  Recommendations: No changes.    Encouraged to call for fluid symptoms.  Follow-up plan: ICM clinic phone appointment on 12/03/2017.      Copy of ICM check sent to Dr. Caryl Comes.   3 month ICM trend: 11/03/2017    1 Year ICM trend:       [name], RN 11/03/2017 2:52 PM

## 2017-11-09 ENCOUNTER — Other Ambulatory Visit: Payer: Self-pay | Admitting: Internal Medicine

## 2017-12-03 ENCOUNTER — Ambulatory Visit (INDEPENDENT_AMBULATORY_CARE_PROVIDER_SITE_OTHER): Payer: BLUE CROSS/BLUE SHIELD

## 2017-12-03 ENCOUNTER — Ambulatory Visit (INDEPENDENT_AMBULATORY_CARE_PROVIDER_SITE_OTHER): Payer: BLUE CROSS/BLUE SHIELD | Admitting: *Deleted

## 2017-12-03 ENCOUNTER — Telehealth: Payer: Self-pay

## 2017-12-03 DIAGNOSIS — I5022 Chronic systolic (congestive) heart failure: Secondary | ICD-10-CM | POA: Diagnosis not present

## 2017-12-03 DIAGNOSIS — Z95810 Presence of automatic (implantable) cardiac defibrillator: Secondary | ICD-10-CM | POA: Diagnosis not present

## 2017-12-03 DIAGNOSIS — I420 Dilated cardiomyopathy: Secondary | ICD-10-CM | POA: Diagnosis not present

## 2017-12-03 NOTE — Progress Notes (Signed)
EPIC Encounter for ICM Monitoring  Patient Name: Suzanne Spears is a 57 y.o. female Date: 12/03/2017 Primary Care Physican: [name], MD Primary Cardiologist: Rockey Situ Electrophysiologist: Caryl Comes Dry Weight:Previous weight 165lbs  Bi-V Pacing: 98.4%         Attempted call to patient and unable to reach.  Left detailed message, per DPR, regarding transmission.  Transmission reviewed.    Thoracic impedance normal.   Prescribed: Furosemide 20 mgtake 1tablet (20 mg total)daily.Patient is taking Furosemide PRN and not daily.Extra dosing only as needed for weight gain or swelling or abdominal bloating(per Dr Donivan Scull OV note 09/17/2017).Potassium 10 mEq 1 tablet daily  Labs: 07/09/2017 Creatinine 1.35, BUN 15, Potassium 4.3, Sodium 138, EGFR 43.10 05/12/2017 Creatinine 1.38, BUN 19, Potassium 4.2, Sodium 141, EGFR 42.04 03/31/2017 Creatinine 1.37, BUN 25, Potassium 3.8, Sodium 138, EGFR 42.41  Recommendations: Left voice mail with ICM number and encouraged to call if experiencing any fluid symptoms.  Follow-up plan: ICM clinic phone appointment on 01/04/2018.       Copy of ICM check sent to Dr. Caryl Comes.   3 month ICM trend: 12/03/2017    1 Year ICM trend:       [name], RN 12/03/2017 2:20 PM

## 2017-12-03 NOTE — Telephone Encounter (Signed)
Remote ICM transmission received.  Attempted call to patient and left detailed message, per DPR, regarding transmission and next ICM scheduled for 01/04/2018.  Advised to return call for any fluid symptoms or questions.    

## 2017-12-03 NOTE — Progress Notes (Signed)
Remote ICD transmission.   

## 2017-12-10 ENCOUNTER — Encounter: Payer: Self-pay | Admitting: Cardiology

## 2017-12-17 LAB — BASIC METABOLIC PANEL
BUN: 16 (ref 4–21)
Creatinine: 1.3 — AB (ref 0.5–1.1)
Glucose: 85
Potassium: 4.1 (ref 3.4–5.3)
Sodium: 138 (ref 137–147)

## 2017-12-24 ENCOUNTER — Other Ambulatory Visit: Payer: Self-pay

## 2017-12-24 DIAGNOSIS — I5032 Chronic diastolic (congestive) heart failure: Secondary | ICD-10-CM

## 2017-12-25 MED ORDER — FUROSEMIDE 20 MG PO TABS: ORAL_TABLET | ORAL | 2 refills | Status: DC

## 2018-01-01 ENCOUNTER — Encounter: Payer: Self-pay | Admitting: Internal Medicine

## 2018-01-04 ENCOUNTER — Ambulatory Visit (INDEPENDENT_AMBULATORY_CARE_PROVIDER_SITE_OTHER): Payer: BLUE CROSS/BLUE SHIELD

## 2018-01-04 DIAGNOSIS — Z95810 Presence of automatic (implantable) cardiac defibrillator: Secondary | ICD-10-CM

## 2018-01-04 DIAGNOSIS — I5022 Chronic systolic (congestive) heart failure: Secondary | ICD-10-CM | POA: Diagnosis not present

## 2018-01-04 NOTE — Progress Notes (Signed)
EPIC Encounter for ICM Monitoring  Patient Name: Suzanne Spears is a 57 y.o. female Date: 01/04/2018 Primary Care Physican: [name], MD Primary Cardiologist: Rockey Situ Electrophysiologist: Vergie Living Pacing: 98.1%  Last Weight: 165 lbs Today's Weight: 165 lbs       Heart Failure questions reviewed, pt asymptomatic.   Thoracic impedance abnormal suggesting fluid accumulation starting 12/30/2017.   Prescribed: Furosemide 20 mgtake 1tablet (20 mg total)daily.Patient is taking Furosemide PRN and not daily.Extra dosing only as needed for weight gain or swelling or abdominal bloating(per Dr Donivan Scull OV note 09/17/2017).Potassium 10 mEq 1 tablet daily  Labs: 07/09/2017 Creatinine 1.35, BUN 15, Potassium 4.3, Sodium 138, EGFR 43.10 05/12/2017 Creatinine 1.38, BUN 19, Potassium 4.2, Sodium 141, EGFR 42.04 03/31/2017 Creatinine 1.37, BUN 25, Potassium 3.8, Sodium 138, EGFR 42.41  Recommendations: Advised to take PRN Furosemide x 1 day.   Encouraged to call for fluid symptoms.  Follow-up plan: ICM clinic phone appointment on 11/15/ 2019 to recheck fluid levels.      Copy of ICM check sent to Dr. Caryl Comes and Dr Rockey Situ.   3 month ICM trend: 01/04/2018   1 Year ICM trend:       [name], RN 01/04/2018 10:43 AM

## 2018-01-08 ENCOUNTER — Ambulatory Visit (INDEPENDENT_AMBULATORY_CARE_PROVIDER_SITE_OTHER): Payer: BLUE CROSS/BLUE SHIELD

## 2018-01-08 DIAGNOSIS — I5022 Chronic systolic (congestive) heart failure: Secondary | ICD-10-CM

## 2018-01-08 DIAGNOSIS — Z95810 Presence of automatic (implantable) cardiac defibrillator: Secondary | ICD-10-CM

## 2018-01-08 NOTE — Progress Notes (Signed)
EPIC Encounter for ICM Monitoring  Patient Name: Suzanne Spears is a 57 y.o. female Date: 01/08/2018 Primary Care Physican: Tullo, Teresa L, MD Primary Cardiologist: Gollan Electrophysiologist: Klein Bi-V Pacing: 98.5% Last Weight: 165 lbs Today's Weight: 165 lbs                                                           Heart Failure questions reviewed, pt asymptomatic.   Thoracic impedance returned to normal after taking extra Furosemide.  Prescribed: Furosemide 20 mgtake 1tablet (20 mg total)daily.Patient is taking Furosemide PRN and not daily.Extra dosing only as needed for weight gain or swelling or abdominal bloating(per Dr Gollan's OV note 09/17/2017).Potassium 10 mEq 1 tablet daily  Labs: 07/09/2017 Creatinine 1.35, BUN 15, Potassium 4.3, Sodium 138, EGFR 43.10 05/12/2017 Creatinine 1.38, BUN 19, Potassium 4.2, Sodium 141, EGFR 42.04 03/31/2017 Creatinine 1.37, BUN 25, Potassium 3.8, Sodium 138, EGFR 42.41  Recommendations:  No change Encouraged to call for fluid symptoms.  Follow-up plan: ICM clinic phone appointment on 02/04/2018.      Copy of ICM check sent to Dr. Klein.   3 month ICM trend: 01/08/2018    1 Year ICM trend:       Laurie S Short, RN 01/08/2018 5:59 PM   

## 2018-01-11 ENCOUNTER — Ambulatory Visit: Payer: BLUE CROSS/BLUE SHIELD | Admitting: Internal Medicine

## 2018-01-12 LAB — CUP PACEART REMOTE DEVICE CHECK
Battery Remaining Longevity: 25 mo
Battery Voltage: 2.95 V
Brady Statistic AP VP Percent: 0.03 %
Brady Statistic AP VS Percent: 0.01 %
Brady Statistic AS VP Percent: 98.54 %
Brady Statistic AS VS Percent: 1.42 %
Brady Statistic RA Percent Paced: 0.05 %
Brady Statistic RV Percent Paced: 1.27 %
Date Time Interrogation Session: 20191010041804
HighPow Impedance: 85 Ohm
Implantable Lead Implant Date: 20160421
Implantable Lead Implant Date: 20160421
Implantable Lead Implant Date: 20161006
Implantable Lead Implant Date: 20161006
Implantable Lead Location: 753858
Implantable Lead Location: 753858
Implantable Lead Location: 753859
Implantable Lead Location: 753860
Implantable Lead Model: 5071
Implantable Lead Model: 5071
Implantable Lead Model: 5076
Implantable Pulse Generator Implant Date: 20160421
Lead Channel Impedance Value: 323 Ohm
Lead Channel Impedance Value: 323 Ohm
Lead Channel Impedance Value: 380 Ohm
Lead Channel Impedance Value: 399 Ohm
Lead Channel Impedance Value: 4047 Ohm
Lead Channel Impedance Value: 4047 Ohm
Lead Channel Pacing Threshold Amplitude: 0.5 V
Lead Channel Pacing Threshold Amplitude: 1.25 V
Lead Channel Pacing Threshold Pulse Width: 0.4 ms
Lead Channel Pacing Threshold Pulse Width: 0.4 ms
Lead Channel Sensing Intrinsic Amplitude: 29.875 mV
Lead Channel Sensing Intrinsic Amplitude: 29.875 mV
Lead Channel Sensing Intrinsic Amplitude: 3 mV
Lead Channel Sensing Intrinsic Amplitude: 3 mV
Lead Channel Setting Pacing Amplitude: 1.5 V
Lead Channel Setting Pacing Amplitude: 2 V
Lead Channel Setting Pacing Amplitude: 2.5 V
Lead Channel Setting Pacing Pulse Width: 0.4 ms
Lead Channel Setting Pacing Pulse Width: 0.4 ms
Lead Channel Setting Sensing Sensitivity: 0.3 mV

## 2018-01-13 ENCOUNTER — Other Ambulatory Visit
Admission: RE | Admit: 2018-01-13 | Discharge: 2018-01-13 | Disposition: A | Discharge status: Home / Self Care | Payer: BLUE CROSS/BLUE SHIELD | Source: Ambulatory Visit | Attending: Family Medicine | Admitting: Family Medicine

## 2018-01-13 DIAGNOSIS — R05 Cough: Secondary | ICD-10-CM | POA: Insufficient documentation

## 2018-01-13 DIAGNOSIS — M79604 Pain in right leg: Secondary | ICD-10-CM | POA: Insufficient documentation

## 2018-01-13 DIAGNOSIS — M79605 Pain in left leg: Secondary | ICD-10-CM | POA: Diagnosis present

## 2018-01-13 LAB — BRAIN NATRIURETIC PEPTIDE: B Natriuretic Peptide: 177 pg/mL — ABNORMAL HIGH (ref 0.0–100.0)

## 2018-01-14 ENCOUNTER — Telehealth: Payer: Self-pay | Admitting: *Deleted

## 2018-01-14 NOTE — Telephone Encounter (Signed)
Call returned to the patient concerning her patient advice request. She stated that she has been having leg pain at night with her feet burning.   She went to the walk in clinic where they suggested taking her lasix. She is currently taking it as needed.   She stated that they are better today. She declined an appointment for follow up but will call back if needed.  She did state that she will drop her FMLA papers off tomorrow for Dr. Donivan Scull review.

## 2018-01-14 NOTE — Telephone Encounter (Signed)
Left a message to call back   Per patient advice request message:  I really can't wait 2 days for a response. My feet and legs are aching. They woke me up during the night. I'm having a problem walking because they ache. I have been coughing a bit. Do you think I need go somewhere to be seen. I don't know if legs hurting has anything to do with my heart.

## 2018-01-14 NOTE — Telephone Encounter (Signed)
Patient returning call.

## 2018-01-15 ENCOUNTER — Telehealth: Payer: Self-pay | Admitting: Cardiovascular Disease

## 2018-01-15 NOTE — Telephone Encounter (Signed)
Patient dropped off FMLA forms to be completed Sent to [facility] through inter office mail

## 2018-01-19 ENCOUNTER — Other Ambulatory Visit: Payer: Self-pay | Admitting: Internal Medicine

## 2018-01-27 ENCOUNTER — Other Ambulatory Visit: Payer: Self-pay | Admitting: Internal Medicine

## 2018-01-28 NOTE — Telephone Encounter (Signed)
Patient dropped off signed release form and $25 check Placed in interoffice mail and sent to [facility]

## 2018-02-04 ENCOUNTER — Ambulatory Visit (INDEPENDENT_AMBULATORY_CARE_PROVIDER_SITE_OTHER): Payer: BLUE CROSS/BLUE SHIELD

## 2018-02-04 DIAGNOSIS — I5022 Chronic systolic (congestive) heart failure: Secondary | ICD-10-CM | POA: Diagnosis not present

## 2018-02-04 DIAGNOSIS — Z95810 Presence of automatic (implantable) cardiac defibrillator: Secondary | ICD-10-CM | POA: Diagnosis not present

## 2018-02-04 NOTE — Progress Notes (Signed)
EPIC Encounter for ICM Monitoring  Patient Name: NARE GASPARI is a 57 y.o. female Date: 02/04/2018 Primary Care Physican: [name], MD Primary Cardiologist: Rockey Situ Electrophysiologist: Vergie Living Pacing: 98.5% Last Weight:165lbs Today's Weight:unknown  Transmission reviewed  Thoracic impedance normal.  Prescribed:Furosemide 20 mgtake 1tablet (20 mg total)daily.Patient is taking Furosemide PRN and not daily.Extra dosing only as needed for weight gain or swelling or abdominal bloating(per Dr Donivan Scull OV note 09/17/2017).Potassium 10 mEq 1 tablet daily  Labs: 07/09/2017 Creatinine 1.35, BUN 15, Potassium 4.3, Sodium 138, EGFR 43.10 05/12/2017 Creatinine 1.38, BUN 19, Potassium 4.2, Sodium 141, EGFR 42.04 03/31/2017 Creatinine 1.37, BUN 25, Potassium 3.8, Sodium 138, EGFR 42.41  Recommendations: No changeEncouraged to call for fluid symptoms.  Follow-up plan: ICM clinic phone appointment on1/20/2020   Copy of ICM check sent to Dr.Klein.   3 month ICM trend: 02/04/2018    1 Year ICM trend:       [name], RN 02/04/2018 5:20 PM

## 2018-02-08 ENCOUNTER — Other Ambulatory Visit: Payer: Self-pay | Admitting: Internal Medicine

## 2018-02-09 NOTE — Telephone Encounter (Signed)
Received forms for [employer] for physician to complete, placed in nurses box

## 2018-02-09 NOTE — Telephone Encounter (Signed)
Forms are with Jule Ser, RN.

## 2018-02-19 NOTE — Telephone Encounter (Signed)
Forwarded to [employer]  °

## 2018-02-19 NOTE — Telephone Encounter (Signed)
Forms completed for [employer] and placed on Sabrina's desk for further processing.  

## 2018-02-26 ENCOUNTER — Ambulatory Visit: Payer: BLUE CROSS/BLUE SHIELD | Admitting: Internal Medicine

## 2018-02-26 ENCOUNTER — Other Ambulatory Visit: Payer: Self-pay | Admitting: Internal Medicine

## 2018-02-26 ENCOUNTER — Other Ambulatory Visit: Payer: Self-pay | Admitting: Cardiovascular Disease

## 2018-02-26 DIAGNOSIS — I429 Cardiomyopathy, unspecified: Secondary | ICD-10-CM

## 2018-03-04 ENCOUNTER — Ambulatory Visit (INDEPENDENT_AMBULATORY_CARE_PROVIDER_SITE_OTHER): Payer: BLUE CROSS/BLUE SHIELD

## 2018-03-04 DIAGNOSIS — I255 Ischemic cardiomyopathy: Secondary | ICD-10-CM

## 2018-03-05 ENCOUNTER — Emergency Department: Payer: BLUE CROSS/BLUE SHIELD

## 2018-03-05 ENCOUNTER — Emergency Department
Admission: EM | Admit: 2018-03-05 | Discharge: 2018-03-05 | Disposition: A | Discharge status: Home / Self Care | Payer: BLUE CROSS/BLUE SHIELD | Attending: Emergency Medicine | Admitting: Emergency Medicine

## 2018-03-05 ENCOUNTER — Encounter: Payer: Self-pay | Admitting: *Deleted

## 2018-03-05 DIAGNOSIS — J45909 Unspecified asthma, uncomplicated: Secondary | ICD-10-CM | POA: Diagnosis not present

## 2018-03-05 DIAGNOSIS — I2510 Atherosclerotic heart disease of native coronary artery without angina pectoris: Secondary | ICD-10-CM | POA: Diagnosis not present

## 2018-03-05 DIAGNOSIS — I5022 Chronic systolic (congestive) heart failure: Secondary | ICD-10-CM | POA: Diagnosis not present

## 2018-03-05 DIAGNOSIS — S161XXA Strain of muscle, fascia and tendon at neck level, initial encounter: Secondary | ICD-10-CM | POA: Diagnosis not present

## 2018-03-05 DIAGNOSIS — Z79899 Other long term (current) drug therapy: Secondary | ICD-10-CM | POA: Insufficient documentation

## 2018-03-05 DIAGNOSIS — S0990XA Unspecified injury of head, initial encounter: Secondary | ICD-10-CM

## 2018-03-05 DIAGNOSIS — Z91040 Latex allergy status: Secondary | ICD-10-CM | POA: Diagnosis not present

## 2018-03-05 DIAGNOSIS — Y929 Unspecified place or not applicable: Secondary | ICD-10-CM | POA: Diagnosis not present

## 2018-03-05 DIAGNOSIS — W228XXA Striking against or struck by other objects, initial encounter: Secondary | ICD-10-CM | POA: Insufficient documentation

## 2018-03-05 DIAGNOSIS — S199XXA Unspecified injury of neck, initial encounter: Secondary | ICD-10-CM | POA: Diagnosis present

## 2018-03-05 DIAGNOSIS — Y999 Unspecified external cause status: Secondary | ICD-10-CM | POA: Insufficient documentation

## 2018-03-05 DIAGNOSIS — Y9389 Activity, other specified: Secondary | ICD-10-CM | POA: Diagnosis not present

## 2018-03-05 DIAGNOSIS — I110 Hypertensive heart disease with heart failure: Secondary | ICD-10-CM | POA: Diagnosis not present

## 2018-03-05 MED ORDER — METHOCARBAMOL 750 MG PO TABS: 750.0000 mg | ORAL_TABLET | Freq: Four times a day (QID) | ORAL | 0 refills | Status: DC

## 2018-03-05 MED ORDER — BUTALBITAL-APAP-CAFFEINE 50-325-40 MG PO TABS: 1.0000 | ORAL_TABLET | Freq: Four times a day (QID) | ORAL | 0 refills | Status: DC | PRN

## 2018-03-05 NOTE — Progress Notes (Signed)
Remote ICD transmission.   
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
## 2018-03-05 NOTE — ED Triage Notes (Signed)
Pt struck her head last Saturday.  Pt states that she heard her neck snap when this occurred.  Pt took a muscle relaxer when this occurred and placed ice pack on head after this occurred.  Pt states that she has continued to have a HA since this occurred and she states that she has a "dent" where she struck her head.  Pt denies any LOC with this last week.  Pt is alert and oriented.

## 2018-03-05 NOTE — ED Notes (Signed)
See triage note  Presents with head and neck pain  States she hit her her head on cabinet

## 2018-03-06 LAB — CUP PACEART REMOTE DEVICE CHECK
Battery Remaining Longevity: 26 mo
Battery Voltage: 2.95 V
Brady Statistic AP VP Percent: 0.03 %
Brady Statistic AP VS Percent: 0.01 %
Brady Statistic AS VP Percent: 98.57 %
Brady Statistic AS VS Percent: 1.4 %
Brady Statistic RA Percent Paced: 0.04 %
Brady Statistic RV Percent Paced: 2.29 %
Date Time Interrogation Session: 20200109062403
HighPow Impedance: 79 Ohm
Implantable Lead Implant Date: 20160421
Implantable Lead Implant Date: 20160421
Implantable Lead Implant Date: 20161006
Implantable Lead Implant Date: 20161006
Implantable Lead Location: 753858
Implantable Lead Location: 753858
Implantable Lead Location: 753859
Implantable Lead Location: 753860
Implantable Lead Model: 5071
Implantable Lead Model: 5071
Implantable Lead Model: 5076
Implantable Pulse Generator Implant Date: 20160421
Lead Channel Impedance Value: 285 Ohm
Lead Channel Impedance Value: 342 Ohm
Lead Channel Impedance Value: 380 Ohm
Lead Channel Impedance Value: 380 Ohm
Lead Channel Impedance Value: 4047 Ohm
Lead Channel Impedance Value: 4047 Ohm
Lead Channel Pacing Threshold Amplitude: 0.5 V
Lead Channel Pacing Threshold Amplitude: 1.125 V
Lead Channel Pacing Threshold Pulse Width: 0.4 ms
Lead Channel Pacing Threshold Pulse Width: 0.4 ms
Lead Channel Sensing Intrinsic Amplitude: 2.625 mV
Lead Channel Sensing Intrinsic Amplitude: 2.625 mV
Lead Channel Sensing Intrinsic Amplitude: 29.625 mV
Lead Channel Sensing Intrinsic Amplitude: 29.625 mV
Lead Channel Setting Pacing Amplitude: 1.5 V
Lead Channel Setting Pacing Amplitude: 2 V
Lead Channel Setting Pacing Amplitude: 2.5 V
Lead Channel Setting Pacing Pulse Width: 0.4 ms
Lead Channel Setting Pacing Pulse Width: 0.4 ms
Lead Channel Setting Sensing Sensitivity: 0.3 mV

## 2018-03-15 ENCOUNTER — Ambulatory Visit (INDEPENDENT_AMBULATORY_CARE_PROVIDER_SITE_OTHER): Payer: BLUE CROSS/BLUE SHIELD

## 2018-03-15 DIAGNOSIS — Z95810 Presence of automatic (implantable) cardiac defibrillator: Secondary | ICD-10-CM

## 2018-03-15 DIAGNOSIS — I5022 Chronic systolic (congestive) heart failure: Secondary | ICD-10-CM | POA: Diagnosis not present

## 2018-03-15 NOTE — Progress Notes (Signed)
EPIC Encounter for ICM Monitoring  Patient Name: Suzanne Spears is a 58 y.o. female Date: 03/15/2018 Primary Care Physican: [name], MD Primary Cardiologist: Rockey Situ Electrophysiologist: Vergie Living Pacing: 98.5% Last Weight:165lbs Today's Weight: 165-166 lbs        Heart Failure questions reviewed.   Pt asymptomatic.  She reported she has resumed low salt diet since the holidays and getting back on track.   Report: Thoracic impedance abnormal suggesting fluid accumulation but trending back close to baseline.  Prescribed: Furosemide 20 mgtake 1tablet (20 mg total)daily.Patient is taking Furosemide PRN and not daily.Extra dosing only as needed for weight gain or swelling or abdominal bloating(per Dr Donivan Scull OV note 09/17/2017).Potassium 10 mEq 1 tablet daily  Labs: 07/09/2017 Creatinine 1.35, BUN 15, Potassium 4.3, Sodium 138, EGFR 43.10 05/12/2017 Creatinine 1.38, BUN 19, Potassium 4.2, Sodium 141, EGFR 42.04 03/31/2017 Creatinine 1.37, BUN 25, Potassium 3.8, Sodium 138, EGFR 42.41  Recommendations: No changes.  Reinforced limiting salt intake to < 2000 mg daily.  Encouraged to call for fluid symptoms.  Follow-up plan: ICM clinic phone appointment on 04/19/2018.      Copy of ICM check sent to Dr. Caryl Comes.   3 month ICM trend: 03/15/2018    1 Year ICM trend:       [name], RN 03/15/2018 2:15 PM

## 2018-03-22 ENCOUNTER — Encounter: Payer: Self-pay | Admitting: Internal Medicine

## 2018-03-22 ENCOUNTER — Ambulatory Visit: Payer: BLUE CROSS/BLUE SHIELD | Admitting: Internal Medicine

## 2018-03-22 VITALS — BP 120/80 | HR 82 | Temp 98.2°F | Resp 15 | Ht 65.5 in | Wt 167.4 lb

## 2018-03-22 DIAGNOSIS — M79671 Pain in right foot: Secondary | ICD-10-CM

## 2018-03-22 DIAGNOSIS — Z79899 Other long term (current) drug therapy: Secondary | ICD-10-CM | POA: Diagnosis not present

## 2018-03-22 DIAGNOSIS — J069 Acute upper respiratory infection, unspecified: Secondary | ICD-10-CM

## 2018-03-22 DIAGNOSIS — S060X0D Concussion without loss of consciousness, subsequent encounter: Secondary | ICD-10-CM

## 2018-03-22 DIAGNOSIS — F418 Other specified anxiety disorders: Secondary | ICD-10-CM

## 2018-03-22 DIAGNOSIS — M79672 Pain in left foot: Secondary | ICD-10-CM

## 2018-03-22 NOTE — Patient Instructions (Addendum)
You may bet getting a viral syndrome .    The post nasal drip may also be contributing to  your  Cough.  I am prescribing a prednisone injection  to manage the inflammation in your Eustachian tubes   I also advise use of the following OTC meds to help with your other symptoms.   Continue generic OTC benadryl 25 mg  1 hour before  bedtime for the drainage,  And continue zyrtec  But take it in the  morning   Use Delsym for daytime cough if needed .   I think your heel pain is from a bone spur   Patches is loved , and that's all he ever wanted.  No matter what happens

## 2018-03-22 NOTE — Progress Notes (Signed)
Subjective:  Patient ID: Suzanne Spears, female    DOB: [birth year]  Age: 58 y.o. MRN: [id number]  CC: The primary encounter diagnosis was Long-term use of high-risk medication. Diagnoses of Viral URI, Heel pain, bilateral, Depression with anxiety, and Concussion with no loss of consciousness, subsequent encounter were also pertinent to this visit.  HPI Suzanne Spears presents for follow up on multiple issues. Last seen in May 2019    ER visit jan 10 , for severe headache lasting  4-5 days after blunt head trauma.  Diagnosed with  a concussion and neck sprain that occurred after hitting her head on a window frame   while she was straining to open a window that was stuck.  Struck head on top of  The window frame  and heard a snap.   No laceration ,but felt an indentation in scalp.  Neck pain and headache eval with CT of both .  History of fusion of c5-6 and c6-7 , no alteration of alignment or hardware.   Dog is sick today,  In hospital  With dehydration . Very worried about the prognosis,  The vet bill,  Etc.    Treated for sinusitis  in November .  Symptoms resolved,  But has been having sinus congestion and drainage for the past week , not sure if sick or due to her house floors being redone at home   Left ear pressure without pain . 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   Left posterior leg pain, popliteal area,  brought on by getting up from the couch or getting  out of bed. Gets Better with movement   Bilateral heel pain that occurs only with direct pressure /weight bearing .  However some days the pain persists even while sitting,  For hours, Sometimes improved by stretching her heel .  History of plantar fasciitis.  Does not want to see ortho /podiatry yet.     Outpatient Medications Prior to Visit  Medication Sig Dispense Refill  . albuterol (PROVENTIL) (2.5 MG/3ML) 0.083% nebulizer solution INHALE 3 MILLILITERS (1 VIAL)  BY NEBULIZER EVERY 6 HOURS AS NEEDED FOR WHEEZING OR SHORTNESS OF BREATH 150 mL 1  . albuterol (VENTOLIN HFA) 108 (90 Base) MCG/ACT inhaler INHALE 2 PUFFS BY MOUTH INTO THE LUNGS EVERY 4 HOURS AS NEEDED FOR WHEEZING. 18 g 3  . ARNUITY ELLIPTA 100 MCG/ACT AEPB INHALE 1 PUFF INTO THE LUNGS ONCE DAILY 30 each 4  . Azelastine-Fluticasone 137-50 MCG/ACT SUSP Place 1 spray into the nose 2 (two) times daily. 23 g 5  . baclofen (LIORESAL) 10 MG tablet Take 10 mg by mouth 2 (two) times daily.   0  . cetirizine (ZYRTEC) 10 MG chewable tablet Chew 10 mg by mouth daily.    . citalopram (CELEXA) 20 MG tablet TAKE (1) TABLET BY MOUTH EVERY DAY 90 tablet 1  . cyanocobalamin (,VITAMIN B-12,) 1000 MCG/ML injection INJECT 1 ML INTO THE MUSCLE ONCE A WEEK 4 mL 3  . DULoxetine (CYMBALTA) 60 MG capsule TAKE ONE (1) CAPSULE EACH DAY. 90 capsule 1  . fluticasone (FLONASE) 50 MCG/ACT nasal spray USE 2 SPRAYS INTO BOTH NOSTRILS ONCE DAILY AS DIRECTED BY PHYSICIAN. 48 g 2  . furosemide (LASIX) 20 MG tablet Take 2 tablets (40 mg total) every other day alternating with 1 tablet (20 mg total) every other day. 135 tablet 2  . gabapentin (NEURONTIN) 100 MG capsule TAKE ONE (1)  CAPSULE THREE (3) TIMES EACH DAY 90 capsule 1  . HYDROcodone-acetaminophen (NORCO/VICODIN) 5-325 MG tablet Take 1 tablet by mouth every 8 (eight) hours as needed for moderate pain.    Lenda Kelp FE 1/20 1-20 MG-MCG tablet TAKE (1) TABLET BY MOUTH EVERY DAY 90 tablet 0  . losartan (COZAAR) 25 MG tablet TAKE ONE (1) TABLET BY MOUTH ONCE DAILY 90 tablet 1  . magnesium gluconate (MAGONATE) 500 MG tablet Take 500 mg by mouth 2 (two) times daily.    . methocarbamol (ROBAXIN-750) 750 MG tablet Take 1 tablet (750 mg total) by mouth 4 (four) times daily. 20 tablet 0  . montelukast (SINGULAIR) 10 MG tablet TAKE (1) TABLET BY MOUTH EVERY DAY 30 tablet 2  . pantoprazole (PROTONIX) 40 MG tablet TAKE (1) TABLET BY MOUTH EVERY DAY 30 tablet 3  . potassium chloride (K-DUR)  10 MEQ tablet TAKE ONE (1) TABLET BY MOUTH ONCE DAILY 90 tablet 3  . Topiramate ER (QUDEXY XR) 50 MG CS24 sprinkle capsule Take 100 mg by mouth daily.   2  . traMADol (ULTRAM) 50 MG tablet Take 1 tablet (50 mg total) by mouth every 6 (six) hours as needed. 60 tablet 2  . traZODone (DESYREL) 50 MG tablet TAKE 1/2 TO 1 TABLET AT BEDTIME AS NEEDED FOR SLEEP 90 tablet 1  . TROKENDI XR 100 MG CP24 100 mg daily.   4  . valACYclovir (VALTREX) 1000 MG tablet Take 1 tablet (1,000 mg total) by mouth 2 (two) times daily as needed (fever blisters). 20 tablet 3  . butalbital-acetaminophen-caffeine (FIORICET, ESGIC) 50-325-40 MG tablet Take 1 tablet by mouth every 6 (six) hours as needed for headache. (Patient not taking: Reported on 03/22/2018) 20 tablet 0   No facility-administered medications prior to visit.     Review of Systems;  Patient denies headache, fevers, malaise, unintentional weight loss, skin rash, eye pain, sinus pain, sore throat, dysphagia,  hemoptysis , cough, dyspnea, wheezing, chest pain, palpitations, orthopnea, edema, abdominal pain, nausea, melena, diarrhea, constipation, flank pain, dysuria, hematuria, urinary  Frequency, nocturia, numbness, tingling, seizures,  Focal weakness, Loss of consciousness,  Tremor, insomnia, depression, anxiety, and suicidal ideation.      Objective:  BP 120/80 (BP Location: Left Arm, Patient Position: Sitting, Cuff Size: Normal)   Pulse 82   Temp 98.2 F (36.8 C) (Oral)   Resp 15   Ht 5' 5.5" (1.664 m)   Wt 167 lb 6.4 oz (75.9 kg)   LMP 07/12/2016   SpO2 97%   BMI 27.43 kg/m   BP Readings from Last 3 Encounters:  03/22/18 120/80  03/05/18 109/86  10/29/17 120/78    Wt Readings from Last 3 Encounters:  03/22/18 167 lb 6.4 oz (75.9 kg)  03/05/18 160 lb (72.6 kg)  10/29/17 169 lb (76.7 kg)    General appearance: alert, cooperative and appears stated age Ears: mildly injected left TM,  normal right TM and external ear canals both  ears Throat: lips, mucosa, and tongue normal; teeth and gums normal Neck: tender cervical chain LNs without enlargement ,  no carotid bruit, supple, symmetrical, trachea midline and thyroid not enlarged, symmetric, no tenderness/mass/nodules Back: symmetric, no curvature. ROM normal. No CVA tenderness. Lungs: clear to auscultation bilaterally Heart: regular rate and rhythm, S1, S2 normal, no murmur, click, rub or gallop Abdomen: soft, non-tender; bowel sounds normal; no masses,  no organomegaly Pulses: 2+ and symmetric Skin: Skin color, texture, turgor normal. No rashes or lesions Lymph nodes: Cervical, supraclavicular, and  axillary nodes normal. Ext:  Heels without erythema or skin breakdown,  No joint swelling.  Pain with direct pressure on plantar suface   No results found for: HGBA1C  Lab Results  Component Value Date   CREATININE 1.3 (A) 12/17/2017   CREATININE 1.35 (H) 07/09/2017   CREATININE 1.38 (H) 05/12/2017    Lab Results  Component Value Date   WBC 8.2 11/27/2016   HGB 13.3 11/27/2016   HCT 38.8 11/27/2016   PLT 202 11/27/2016   GLUCOSE 93 07/09/2017   CHOL 236 (H) 08/17/2015   TRIG 164.0 (H) 08/17/2015   HDL 50.90 08/17/2015   LDLDIRECT 151.6 05/01/2011   LDLCALC 152 (H) 08/17/2015   ALT 12 03/31/2017   AST 17 03/31/2017   NA 138 12/17/2017   K 4.1 12/17/2017   CL 107 07/09/2017   CREATININE 1.3 (A) 12/17/2017   BUN 16 12/17/2017   CO2 25 07/09/2017   TSH 1.51 03/31/2017   INR 2.8 03/27/2015    Ct Head Wo Contrast  Result Date: 03/05/2018 CLINICAL DATA:  Recent head injury.  Headache. EXAM: CT HEAD WITHOUT CONTRAST CT CERVICAL SPINE WITHOUT CONTRAST TECHNIQUE: Multidetector CT imaging of the head and cervical spine was performed following the standard protocol without intravenous contrast. Multiplanar CT image reconstructions of the cervical spine were also generated. COMPARISON:  CT head 05/27/2017 FINDINGS: CT HEAD FINDINGS Brain: No evidence of acute  infarction, hemorrhage, hydrocephalus, extra-axial collection or mass lesion/mass effect. Vascular: Negative for hyperdense vessel Skull: Negative for fracture Sinuses/Orbits: Negative Other: None CT CERVICAL SPINE FINDINGS Alignment: Normal alignment Skull base and vertebrae: Negative for fracture Soft tissues and spinal canal: Negative Disc levels: ACDF with solid fusion C5-6 and C6-7. Mild disc degeneration C3-4 and C4-5. Upper chest: Negative Other: None IMPRESSION: 1. Negative CT head 2. Negative for cervical spine fracture 3. Solid fusion C5-6 and C6-7. Electronically Signed   By: Franchot Gallo M.D.   On: 03/05/2018 10:32   Ct Cervical Spine Wo Contrast  Result Date: 03/05/2018 CLINICAL DATA:  Recent head injury.  Headache. EXAM: CT HEAD WITHOUT CONTRAST CT CERVICAL SPINE WITHOUT CONTRAST TECHNIQUE: Multidetector CT imaging of the head and cervical spine was performed following the standard protocol without intravenous contrast. Multiplanar CT image reconstructions of the cervical spine were also generated. COMPARISON:  CT head 05/27/2017 FINDINGS: CT HEAD FINDINGS Brain: No evidence of acute infarction, hemorrhage, hydrocephalus, extra-axial collection or mass lesion/mass effect. Vascular: Negative for hyperdense vessel Skull: Negative for fracture Sinuses/Orbits: Negative Other: None CT CERVICAL SPINE FINDINGS Alignment: Normal alignment Skull base and vertebrae: Negative for fracture Soft tissues and spinal canal: Negative Disc levels: ACDF with solid fusion C5-6 and C6-7. Mild disc degeneration C3-4 and C4-5. Upper chest: Negative Other: None IMPRESSION: 1. Negative CT head 2. Negative for cervical spine fracture 3. Solid fusion C5-6 and C6-7. Electronically Signed   By: Franchot Gallo M.D.   On: 03/05/2018 10:32    Assessment & Plan:   Problem List Items Addressed This Visit    Concussion with no loss of consciousness, subsequent encounter    ER records reviewed which included CT of head and  spine with no acute injuries noted.       Depression with anxiety    Symptoms have greatly improved sinc last visit on citalopram to 20 mg daily  And cymbalta 60 mg daily   No changes  today .       Heel pain, bilateral    Exam suggests calcaneal spurring vs  recurrence of plantar fasciitis.  Recommend gel inserts,  Podiatry referral if persistent.       Viral URI    Exam normal except for injected left TM without bulge or effusion and tender cervical LAD.  Depo Medrol injection give,  toical decongestants and saline rinses advised.  (intolerant to prednisone oral tapers)       Other Visit Diagnoses    Long-term use of high-risk medication    -  Primary   Relevant Orders   Comprehensive metabolic panel     A total of 40 minutes was spent with patient more than half of which was spent in counseling patient on the above mentioned issues , reviewing and explaining recent labs and imaging studies done, and coordination of care.   I have discontinued Isidoro Donning. Franqui "Keane"'s butalbital-acetaminophen-caffeine. I am also having her maintain her valACYclovir, TROKENDI XR, baclofen, HYDROcodone-acetaminophen, Azelastine-Fluticasone, cetirizine, traMADol, albuterol, fluticasone, magnesium gluconate, topiramate ER, gabapentin, traZODone, cyanocobalamin, pantoprazole, furosemide, albuterol, montelukast, citalopram, ARNUITY ELLIPTA, DULoxetine, losartan, JUNEL FE 1/20, potassium chloride, and methocarbamol.  No orders of the defined types were placed in this encounter.   Medications Discontinued During This Encounter  Medication Reason  . butalbital-acetaminophen-caffeine (FIORICET, ESGIC) 50-325-40 MG tablet Completed Course    Follow-up: No follow-ups on file.   [name], MD

## 2018-03-23 DIAGNOSIS — S060X0D Concussion without loss of consciousness, subsequent encounter: Secondary | ICD-10-CM

## 2018-03-23 DIAGNOSIS — M79671 Pain in right foot: Secondary | ICD-10-CM | POA: Insufficient documentation

## 2018-03-23 DIAGNOSIS — M79672 Pain in left foot: Secondary | ICD-10-CM

## 2018-03-23 HISTORY — DX: Concussion without loss of consciousness, subsequent encounter: S06.0X0D

## 2018-03-23 LAB — COMPREHENSIVE METABOLIC PANEL
ALT: 19 U/L (ref 0–35)
AST: 25 U/L (ref 0–37)
Albumin: 4.1 g/dL (ref 3.5–5.2)
Alkaline Phosphatase: 104 U/L (ref 39–117)
BUN: 20 mg/dL (ref 6–23)
CO2: 24 mEq/L (ref 19–32)
Calcium: 9.7 mg/dL (ref 8.4–10.5)
Chloride: 108 mEq/L (ref 96–112)
Creatinine, Ser: 1.29 mg/dL — ABNORMAL HIGH (ref 0.40–1.20)
GFR: 42.62 mL/min — ABNORMAL LOW (ref >60.00)
Glucose, Bld: 92 mg/dL (ref 70–99)
Potassium: 4.1 mEq/L (ref 3.5–5.1)
Sodium: 140 mEq/L (ref 135–145)
Total Bilirubin: 0.3 mg/dL (ref 0.2–1.2)
Total Protein: 6.6 g/dL (ref 6.0–8.3)

## 2018-03-23 MED ORDER — METHYLPREDNISOLONE ACETATE 40 MG/ML IJ SUSP
40.0000 mg | Freq: Once | INTRAMUSCULAR | Status: AC
  Administered 2018-03-25: 40 mg via INTRAMUSCULAR

## 2018-03-23 NOTE — Assessment & Plan Note (Addendum)
Exam normal except for injected left TM without bulge or effusion and tender cervical LAD.  Depo Medrol injection give,  toical decongestants and saline rinses advised.  (intolerant to prednisone oral tapers)

## 2018-03-23 NOTE — Assessment & Plan Note (Signed)
ER records reviewed which included CT of head and spine with no acute injuries noted.

## 2018-03-23 NOTE — Assessment & Plan Note (Signed)
Symptoms have greatly improved sinc last visit on citalopram to 20 mg daily  And cymbalta 60 mg daily   No changes  today .

## 2018-03-23 NOTE — Assessment & Plan Note (Signed)
Exam suggests calcaneal spurring vs recurrence of plantar fasciitis.  Recommend gel inserts,  Podiatry referral if persistent.

## 2018-03-25 DIAGNOSIS — J069 Acute upper respiratory infection, unspecified: Secondary | ICD-10-CM | POA: Diagnosis not present

## 2018-03-30 ENCOUNTER — Other Ambulatory Visit: Payer: Self-pay | Admitting: Internal Medicine

## 2018-03-31 ENCOUNTER — Other Ambulatory Visit: Payer: Self-pay | Admitting: Internal Medicine

## 2018-04-08 ENCOUNTER — Other Ambulatory Visit: Payer: Self-pay | Admitting: Internal Medicine

## 2018-04-12 ENCOUNTER — Other Ambulatory Visit: Payer: Self-pay | Admitting: Neurology

## 2018-04-12 ENCOUNTER — Other Ambulatory Visit (HOSPITAL_COMMUNITY): Payer: Self-pay | Admitting: Neurology

## 2018-04-12 DIAGNOSIS — M5442 Lumbago with sciatica, left side: Principal | ICD-10-CM

## 2018-04-12 DIAGNOSIS — M5441 Lumbago with sciatica, right side: Principal | ICD-10-CM

## 2018-04-12 DIAGNOSIS — G8929 Other chronic pain: Secondary | ICD-10-CM

## 2018-04-16 ENCOUNTER — Ambulatory Visit
Admission: RE | Admit: 2018-04-16 | Discharge: 2018-04-16 | Disposition: A | Discharge status: Home / Self Care | Payer: BLUE CROSS/BLUE SHIELD | Source: Ambulatory Visit | Attending: Neurology | Admitting: Neurology

## 2018-04-16 DIAGNOSIS — M5441 Lumbago with sciatica, right side: Secondary | ICD-10-CM | POA: Insufficient documentation

## 2018-04-16 DIAGNOSIS — M5442 Lumbago with sciatica, left side: Secondary | ICD-10-CM | POA: Insufficient documentation

## 2018-04-16 DIAGNOSIS — G8929 Other chronic pain: Secondary | ICD-10-CM | POA: Insufficient documentation

## 2018-04-19 ENCOUNTER — Ambulatory Visit (INDEPENDENT_AMBULATORY_CARE_PROVIDER_SITE_OTHER): Payer: BLUE CROSS/BLUE SHIELD

## 2018-04-19 DIAGNOSIS — Z95810 Presence of automatic (implantable) cardiac defibrillator: Secondary | ICD-10-CM

## 2018-04-19 DIAGNOSIS — I5022 Chronic systolic (congestive) heart failure: Secondary | ICD-10-CM | POA: Diagnosis not present

## 2018-04-19 NOTE — Progress Notes (Signed)
EPIC Encounter for ICM Monitoring  Patient Name: Suzanne Spears is a 58 y.o. female Date: 04/19/2018 Primary Care Physican: [name], MD Primary Cardiologist: Rockey Situ Electrophysiologist: Vergie Living Pacing: 98.3% Last Weight:165lbs Today's Weight: 161 lbs                                                            Heart Failure questions reviewed. Pt asymptomatic.     Report: Thoracic impedance starting to trend below baseline suggesting fluid accumulation since 04/18/2018.  Prescribed: Furosemide 20 mgtake 1tablet (20 mg total)daily.Patient is taking Furosemide PRN and not daily.Extra dosing only as needed for weight gain or swelling or abdominal bloating(per Dr Donivan Scull OV note 09/17/2017).Potassium 10 mEq 1 tablet daily  Labs: 03/22/2018 Creatinine 1.29, BUN 20, Potassium 4.1, Sodium 140, GFR 42.62 07/09/2017 Creatinine 1.35, BUN 15, Potassium 4.3, Sodium 138, GFR 43.10 05/12/2017 Creatinine 1.38, BUN 19, Potassium 4.2, Sodium 141, GFR 42.04 03/31/2017 Creatinine 1.37, BUN 25, Potassium 3.8, Sodium 138, GFR 42.41  Recommendations:  Reinforced limiting salt intake to < 2000 mg daily.  Encouraged to call for fluid symptoms.  Follow-up plan: ICM clinic phone appointment on 05/24/2018.      Copy of ICM check sent to Dr. Caryl Comes.   3 month ICM trend: 04/19/2018    1 Year ICM trend:       [name], RN 04/19/2018 9:41 AM

## 2018-04-20 ENCOUNTER — Ambulatory Visit: Payer: BLUE CROSS/BLUE SHIELD

## 2018-04-22 ENCOUNTER — Telehealth: Payer: Self-pay | Admitting: Cardiology

## 2018-04-22 NOTE — Telephone Encounter (Signed)
Patient called and wanted to know if she could use a TENS unit on her neck. I informed pt that typically we have patient schedule an appt w/ Device clinic and she bring the tens unit to the office and we can check her device while she uses the TENS unit to ensure there is no interference. Pt did not agree to an appt today. She also wanted to make sure it is ok to wear and iphone. I informed her that an Katheran Awe is ok

## 2018-05-03 ENCOUNTER — Other Ambulatory Visit: Payer: Self-pay | Admitting: Internal Medicine

## 2018-05-10 ENCOUNTER — Telehealth: Payer: Self-pay | Admitting: Cardiovascular Disease

## 2018-05-10 NOTE — Telephone Encounter (Signed)
Call from patient with hx HF and implanted ICD. 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 She reports small partition between co workers but is concerned about working during this time d/t cardiac history.   She is wanting information from Dr. Rockey Situ about her specific case in regards to continuing work. She was last seen on 08/2017 with a plan for 81 M f/u.  I explained contact precautions to patient with handwashing techniques, refraining from face touching and avoiding common areas when possible. I suggested keeping a 6 foot distance from coworkers. She reported that she would consult with her boss in regards to getting more space from others.   Routed to provider to further advise.

## 2018-05-10 NOTE — Telephone Encounter (Signed)
Pt would like to discuss, with her current cardiac issues what she should do about working with the [employer] . Please call to discuss. Patient states it is ok to leave a detailed message.

## 2018-05-11 NOTE — Telephone Encounter (Signed)
Patient verbalized understanding of his recommendations. Encouraged her to speak with her HR or employee health if concerned with their guidelines concerning employees who are sick.

## 2018-05-11 NOTE — Telephone Encounter (Signed)
If person next to her has cough, fever, URI sx, that person should be screened would move away if needed

## 2018-05-14 ENCOUNTER — Other Ambulatory Visit: Payer: Self-pay | Admitting: Internal Medicine

## 2018-05-24 ENCOUNTER — Other Ambulatory Visit: Payer: Self-pay

## 2018-05-24 ENCOUNTER — Ambulatory Visit (INDEPENDENT_AMBULATORY_CARE_PROVIDER_SITE_OTHER): Payer: BLUE CROSS/BLUE SHIELD

## 2018-05-24 DIAGNOSIS — Z95810 Presence of automatic (implantable) cardiac defibrillator: Secondary | ICD-10-CM

## 2018-05-24 DIAGNOSIS — I5022 Chronic systolic (congestive) heart failure: Secondary | ICD-10-CM

## 2018-05-24 NOTE — Progress Notes (Signed)
EPIC Encounter for ICM Monitoring  Patient Name: Suzanne Spears is a 58 y.o. female Date: 05/24/2018 Primary Care Physican: [name], MD Primary Cardiologist: Rockey Situ Electrophysiologist: Vergie Living Pacing: 98.3% 05/24/2018 Weight:161lbs    Heart Failure questions reviewed. Pt has gained weight over the last couple of days.  She took PRN Furosemide today.   Report: Thoracic impedance starting to trend below baseline suggesting fluid accumulation since 04/18/2018.  Prescribed:Furosemide 20 mgtake 1tablet (20 mg total)daily.Patient is taking Furosemide PRN and not daily.Extra dosing only as needed for weight gain or swelling or abdominal bloating(per Dr Donivan Scull OV note 09/17/2017).Potassium 10 mEq 1 tablet daily  Labs: 03/22/2018 Creatinine 1.29, BUN 20, Potassium 4.1, Sodium 140, GFR 42.62 07/09/2017 Creatinine 1.35, BUN 15, Potassium 4.3, Sodium 138, GFR 43.10 05/12/2017 Creatinine 1.38, BUN 19, Potassium 4.2, Sodium 141, GFR 42.04 03/31/2017 Creatinine 1.37, BUN 25, Potassium 3.8, Sodium 138, GFR 42.41  Recommendations: Advised to take PRN furosemide until weight is at baseline.   Follow-up plan: ICM clinic phone appointment on4/07/2018 to recheck fluid levels.   Copy of ICM check sent to Dr.Klein and Dr Rockey Situ   3 month ICM trend: 05/24/2018    1 Year ICM trend:       [name], RN 05/24/2018 1:22 PM

## 2018-05-31 ENCOUNTER — Other Ambulatory Visit: Payer: Self-pay

## 2018-06-03 ENCOUNTER — Other Ambulatory Visit: Payer: Self-pay

## 2018-06-03 ENCOUNTER — Ambulatory Visit (INDEPENDENT_AMBULATORY_CARE_PROVIDER_SITE_OTHER): Payer: BLUE CROSS/BLUE SHIELD | Admitting: *Deleted

## 2018-06-03 DIAGNOSIS — I255 Ischemic cardiomyopathy: Secondary | ICD-10-CM

## 2018-06-03 LAB — CUP PACEART REMOTE DEVICE CHECK
Battery Remaining Longevity: 25 mo
Battery Voltage: 2.94 V
Brady Statistic AP VP Percent: 0.02 %
Brady Statistic AP VS Percent: 0.01 %
Brady Statistic AS VP Percent: 98.6 %
Brady Statistic AS VS Percent: 1.37 %
Brady Statistic RA Percent Paced: 0.03 %
Brady Statistic RV Percent Paced: 1.05 %
Date Time Interrogation Session: 20200409041602
HighPow Impedance: 89 Ohm
Implantable Lead Implant Date: 20160421
Implantable Lead Implant Date: 20160421
Implantable Lead Implant Date: 20161006
Implantable Lead Implant Date: 20161006
Implantable Lead Location: 753858
Implantable Lead Location: 753858
Implantable Lead Location: 753859
Implantable Lead Location: 753860
Implantable Lead Model: 5071
Implantable Lead Model: 5071
Implantable Lead Model: 5076
Implantable Pulse Generator Implant Date: 20160421
Lead Channel Impedance Value: 342 Ohm
Lead Channel Impedance Value: 342 Ohm
Lead Channel Impedance Value: 399 Ohm
Lead Channel Impedance Value: 399 Ohm
Lead Channel Impedance Value: 4047 Ohm
Lead Channel Impedance Value: 4047 Ohm
Lead Channel Pacing Threshold Amplitude: 0.5 V
Lead Channel Pacing Threshold Amplitude: 1.375 V
Lead Channel Pacing Threshold Pulse Width: 0.4 ms
Lead Channel Pacing Threshold Pulse Width: 0.4 ms
Lead Channel Sensing Intrinsic Amplitude: 31.625 mV
Lead Channel Sensing Intrinsic Amplitude: 4.875 mV
Lead Channel Setting Pacing Amplitude: 1.5 V
Lead Channel Setting Pacing Amplitude: 2 V
Lead Channel Setting Pacing Amplitude: 2.5 V
Lead Channel Setting Pacing Pulse Width: 0.4 ms
Lead Channel Setting Pacing Pulse Width: 0.4 ms
Lead Channel Setting Sensing Sensitivity: 0.3 mV

## 2018-06-04 NOTE — Progress Notes (Signed)
No ICM remote transmission received for 05/31/2018 and next ICM transmission scheduled for 06/28/2018.

## 2018-06-11 ENCOUNTER — Other Ambulatory Visit: Payer: Self-pay

## 2018-06-11 ENCOUNTER — Encounter: Payer: Self-pay | Admitting: Cardiology

## 2018-06-11 NOTE — Progress Notes (Signed)
Remote ICD transmission.   

## 2018-06-28 ENCOUNTER — Ambulatory Visit (INDEPENDENT_AMBULATORY_CARE_PROVIDER_SITE_OTHER): Payer: BLUE CROSS/BLUE SHIELD

## 2018-06-28 ENCOUNTER — Other Ambulatory Visit: Payer: Self-pay

## 2018-06-28 DIAGNOSIS — I5022 Chronic systolic (congestive) heart failure: Secondary | ICD-10-CM | POA: Diagnosis not present

## 2018-06-28 DIAGNOSIS — Z95810 Presence of automatic (implantable) cardiac defibrillator: Secondary | ICD-10-CM | POA: Diagnosis not present

## 2018-06-29 NOTE — Progress Notes (Signed)
EPIC Encounter for ICM Monitoring  Patient Name: Suzanne Spears is a 58 y.o. female Date: 06/29/2018 Primary Care Physican: [name], MD Primary Cardiologist: Rockey Situ Electrophysiologist: Vergie Living Pacing: 98.4% 05/24/2018 Weight:161lbs 06/29/2018 Weight: 159 lbs    Heart Failure questions reviewed.Ptasymptomatic.   OptiVol Thoracic impedancenormal.  Prescribed:Furosemide 20 mgtake 1tablet (20 mg total)daily.Patient is taking Furosemide PRN and not daily.Extra dosing only as needed for weight gain or swelling or abdominal bloating(per Dr Donivan Scull OV note 09/17/2017).Potassium 10 mEq 1 tablet daily  Labs: 03/22/2018 Creatinine 1.29, BUN 20, Potassium 4.1, Sodium 140, GFR 42.62 07/09/2017 Creatinine 1.35, BUN 15, Potassium 4.3, Sodium 138, GFR 43.10 05/12/2017 Creatinine 1.38, BUN 19, Potassium 4.2, Sodium 141, GFR 42.04 03/31/2017 Creatinine 1.37, BUN 25, Potassium 3.8, Sodium 138, GFR 42.41  Recommendations: No changes and encouraged to call for fluid symptoms.   Follow-up plan: ICM clinic phone appointment on6/09/2018.   Copy of ICM check sent to [place].  3 month ICM trend: 06/28/2018    1 Year ICM trend:       [name], RN 06/29/2018 8:51 AM

## 2018-06-30 ENCOUNTER — Encounter: Payer: Self-pay | Admitting: Internal Medicine

## 2018-06-30 ENCOUNTER — Ambulatory Visit (INDEPENDENT_AMBULATORY_CARE_PROVIDER_SITE_OTHER): Payer: BLUE CROSS/BLUE SHIELD | Admitting: Internal Medicine

## 2018-06-30 DIAGNOSIS — J4520 Mild intermittent asthma, uncomplicated: Secondary | ICD-10-CM

## 2018-06-30 NOTE — Progress Notes (Signed)
[facility] Consultation      MRN# [id number] Suzanne Spears 06-Dec-1960 Brief History: 58 year old female seen in consultation as transition of care from Dr. Raul Del office for mild to moderate asthma, previously saw [facility], then transferred to [facility], now back to [facility]. Medical history significant for left bundle branch block, mitral prolapse, dilated cardiomyopathy, asthma, hypertension, chronic systolic congestive heart failure, status post ICD placement, hx of pneumonia, which are all stable. Work allergens may aggravate cough, follow with ENT for nasal septum issues.  Marland Kitchen  PFT's are essentially normal with moderate decrease in FEF 25-75 62% (FEV1 80%, FEV1/FVC 72%) Normal 76mwt - no desats, walked 1135ft/360m   07/09/15 PFTs - FEV1 80, FEV1/FVC 72, FEF 25-70 562, DLCO 91%. No significant obstruction,recurrent bronchodilator response, DLCO uncorrected within normal limits, normal curves. 6 minute walk test-no significant desaturations, lowest desaturation 96%, 1181 feet, 360 m       VIDEO/TELEPHONE VISIT    In the setting of the current Covid19 crisis, you are scheduled for a  visit with me on 06/30/2018  Just as we do with many in-office visits, in order for you to participate in this visit, we must obtain consent.   I can obtain your verbal consent now.  PATIENT AGREES AND CONFIRMS -YES This Visit has Audio and Visual Capabilities for optimal patient care experience   Evaluation Performed:  Follow-up visit  This visit type was conducted due to national recommendations for restrictions regarding the COVID-19 Pandemic (e.g. social distancing).  This format is felt to be most appropriate for this patient at this time.  All issues noted in this document were discussed and addressed.     Virtual Visit via Telephone Note     I connected with patient on 06/30/2018  by telephone and verified that I am speaking with the correct person using two identifiers.   I  discussed the limitations, risks, security and privacy concerns of performing an evaluation and management service by telephone and the availability of in person appointments. I also discussed with the patient that there may be a patient responsible charge related to this service. The patient expressed understanding and agreed to proceed.   Location of the patient: Home Location of provider: Home Participating persons: Patient and provider only  CC: Follow up ASTHMA     HPI Triggers include Perfumes, cold AIR, pollen,grass Works as Educational psychologist to paint and flooring(remodeling)  On Arnuity 100 and doing well ASTHMA well controlled  Infrequent albuterol use Overall with significant improvement since starting ICS.  No signs of infection  No signs of asthma exacerbation  I have discussed SLeep study repoprts-No evidence of OSA Patient still with daytime sleepiness most likely related to antipsychotic meds Also has untreated GERD symptoms-on PPI   Has significant h/o Cardiomyopathy sees Dr. Caryl Comes No lower ext swelling No signs of infection at this time Takes lasix as needed EF 25%  Her allergic rhinitis does seem to be under contro   Medication:    Current Outpatient Medications:  .  albuterol (PROVENTIL) (2.5 MG/3ML) 0.083% nebulizer solution, INHALE 3 MILLILITERS (1 VIAL) BY NEBULIZER EVERY 6 HOURS AS NEEDED FOR WHEEZING OR SHORTNESS OF BREATH, Disp: 150 mL, Rfl: 1 .  albuterol (VENTOLIN HFA) 108 (90 Base) MCG/ACT inhaler, INHALE 2 PUFFS EVERY 4 HOURS AS NEEDED FOR WHEEZING, Disp: 18 g, Rfl: 3 .  ARNUITY ELLIPTA 100 MCG/ACT AEPB, INHALE 1 PUFF INTO THE LUNGS ONCE DAILY, Disp: 30 each, Rfl: 4 .  Azelastine-Fluticasone  137-50 MCG/ACT SUSP, Place 1 spray into the nose 2 (two) times daily., Disp: 23 g, Rfl: 5 .  baclofen (LIORESAL) 10 MG tablet, Take 10 mg by mouth 2 (two) times daily. , Disp: , Rfl: 0 .  cetirizine (ZYRTEC) 10 MG chewable tablet, Chew 10 mg  by mouth daily., Disp: , Rfl:  .  citalopram (CELEXA) 20 MG tablet, TAKE (1) TABLET BY MOUTH EVERY DAY, Disp: 90 tablet, Rfl: 1 .  cyanocobalamin (,VITAMIN B-12,) 1000 MCG/ML injection, INJECT 1 ML INTO THE MUSCLE ONCE A WEEK, Disp: 4 mL, Rfl: 3 .  DULoxetine (CYMBALTA) 60 MG capsule, TAKE ONE (1) CAPSULE EACH DAY., Disp: 90 capsule, Rfl: 1 .  fluticasone (FLONASE) 50 MCG/ACT nasal spray, USE 2 SPRAYS INTO BOTH NOSTRILS ONCE DAILY AS DIRECTED BY PHYSICIAN., Disp: 48 g, Rfl: 2 .  furosemide (LASIX) 20 MG tablet, Take 2 tablets (40 mg total) every other day alternating with 1 tablet (20 mg total) every other day., Disp: 135 tablet, Rfl: 2 .  gabapentin (NEURONTIN) 100 MG capsule, TAKE ONE (1) CAPSULE THREE (3) TIMES EACH DAY, Disp: 90 capsule, Rfl: 1 .  HYDROcodone-acetaminophen (NORCO/VICODIN) 5-325 MG tablet, Take 1 tablet by mouth every 8 (eight) hours as needed for moderate pain., Disp: , Rfl:  .  JUNEL FE 1/20 1-20 MG-MCG tablet, TAKE (1) TABLET BY MOUTH EVERY DAY, Disp: 90 tablet, Rfl: 0 .  losartan (COZAAR) 25 MG tablet, TAKE ONE (1) TABLET BY MOUTH ONCE DAILY, Disp: 90 tablet, Rfl: 1 .  magnesium gluconate (MAGONATE) 500 MG tablet, Take 500 mg by mouth 2 (two) times daily., Disp: , Rfl:  .  methocarbamol (ROBAXIN-750) 750 MG tablet, Take 1 tablet (750 mg total) by mouth 4 (four) times daily., Disp: 20 tablet, Rfl: 0 .  montelukast (SINGULAIR) 10 MG tablet, TAKE (1) TABLET BY MOUTH EVERY DAY, Disp: 90 tablet, Rfl: 1 .  pantoprazole (PROTONIX) 40 MG tablet, TAKE ONE (1) TABLET BY MOUTH ONCE DAILY, Disp: 30 tablet, Rfl: 3 .  potassium chloride (K-DUR) 10 MEQ tablet, TAKE ONE (1) TABLET BY MOUTH ONCE DAILY, Disp: 90 tablet, Rfl: 3 .  Topiramate ER (QUDEXY XR) 50 MG CS24 sprinkle capsule, Take 100 mg by mouth daily. , Disp: , Rfl: 2 .  traMADol (ULTRAM) 50 MG tablet, TAKE (1) TABLET BY MOUTH EVERY 6 HOURS AS NEEDED, Disp: 60 tablet, Rfl: 5 .  traZODone (DESYREL) 50 MG tablet, TAKE 1/2 TO 1 TABLET  BY MOUTH AT BEDTIMEAS NEEDED FOR SLEEP, Disp: 90 tablet, Rfl: 1 .  TROKENDI XR 100 MG CP24, 100 mg daily. , Disp: , Rfl: 4 .  valACYclovir (VALTREX) 1000 MG tablet, Take 1 tablet (1,000 mg total) by mouth 2 (two) times daily as needed (fever blisters)., Disp: 20 tablet, Rfl: 3    Review of Systems  Constitutional: Negative for chills and fever.  HENT: Negative for congestion.   Eyes: Negative for blurred vision and double vision.  Respiratory: Negative for cough, sputum production, shortness of breath and wheezing.        Mild intermittent cough  Cardiovascular: Negative for chest pain.  Gastrointestinal: Negative for heartburn and nausea.  Genitourinary: Negative for dysuria.  Musculoskeletal: Negative for myalgias.  Neurological: Negative for dizziness and headaches.  Psychiatric/Behavioral: Nervous/anxious: .vs.       Allergies:  Adhesive [tape]; Coreg [carvedilol]; Metoprolol; Penicillin g; Lisinopril; Prednisone; Latex; and Levofloxacin  Physical Examination:  LMP 07/12/2016         Assessment and Plan:  58 year old seen in follow-up for asthma-mild intemittent, well controlled at this time  Asthma, chronic Plan to stop ARNUITY FOR 2 [place] Patient was very happy with this plan I advised her to call back in 2 weeks to update [name]  albuterol as needed    she claims that she has allergies to prednisone, but her descriptions are more in line with side effects of high-dose steroids.  I have discussed with her in the future if she does need prednisone, we can start off at lower dose, she will consider this when that time comes or may consider decadron  -Avoid noxious substances such as smoke, perfumes, and other sick contacts. -Continue with  allergy regimen   Allerghic Rhinitis-under control - zyrtec - flonase -singulair  GI-s/p esoph dilatation Follow up GI as needed  CArdiology-h/o CAD and s/p pacemaker Patient needs to follow up  with cardiology if symptoms persist Lasix as needed and tolerated  GERD -continue protonix     COVID-19 Education: The signs and symptoms of COVID-19 were discussed with the patient and how to seek care for testing (follow up with PCP or arrange E-visit).  The importance of social distancing was discussed today.   Patient satisfied with Plan of action and management. All questions answered Follow up 3 months   TOTAL TIME SPENT 22 minutes  Corrin Parker, M.D.  [facility]  Medical Director [place] Director [facility] Cardio-Pulmonary Department

## 2018-06-30 NOTE — Patient Instructions (Signed)
LETS PLAN TO HOLD ARNUITY FOR 2 WEEKS AND ASSESS ASTHMA PATIENT ADVISED TO CALL BACK IN 2 WEEKS TO GIVE [name] FEEDBACK

## 2018-07-01 ENCOUNTER — Telehealth: Payer: Self-pay | Admitting: Internal Medicine

## 2018-07-01 NOTE — Telephone Encounter (Signed)
Call transferred directly to me from scheduling.  The patient states she was returning a call to [facility] to schedule a virtual visit with Dr. Caryl Comes.  I am unable to transfer the call.  She is agreeable with a Video Visit on 07/07/18 at 9:30 am with Dr. Caryl Comes.   Confirmed to text link to [phone]     Virtual Visit Pre-Appointment Phone Call  "(Name), I am calling you today to discuss your upcoming appointment. We are currently trying to limit exposure to the virus that causes COVID-19 by seeing patients at home rather than in the office."  1. "What is the BEST phone number to call the day of the visit?" - include this in appointment notes  2. "Do you have or have access to (through a family member/friend) a smartphone with video capability that we can use for your visit?" a. If yes - list this number in appt notes as "cell" (if different from BEST phone #) and list the appointment type as a VIDEO visit in appointment notes b. If no - list the appointment type as a PHONE visit in appointment notes  3. Confirm consent - "In the setting of the current Covid19 crisis, you are scheduled for a (phone or video) visit with your provider on (date) at (time).  Just as we do with many in-office visits, in order for you to participate in this visit, we must obtain consent.  If you'd like, I can send this to your mychart (if signed up) or email for you to review.  Otherwise, I can obtain your verbal consent now.  All virtual visits are billed to your insurance company just like a normal visit would be.  By agreeing to a virtual visit, we'd like you to understand that the technology does not allow for your provider to perform an examination, and thus may limit your provider's ability to fully assess your condition. If your provider identifies any concerns that need to be evaluated in person, we will make arrangements to do so.  Finally, though the technology is pretty good, we cannot assure that it will  always work on either your or our end, and in the setting of a video visit, we may have to convert it to a phone-only visit.  In either situation, we cannot ensure that we have a secure connection.  Are you willing to proceed?" STAFF: Did the patient verbally acknowledge consent to telehealth visit? Document YES/NO here: Yes  4. Advise patient to be prepared - "Two hours prior to your appointment, go ahead and check your blood pressure, pulse, oxygen saturation, and your weight (if you have the equipment to check those) and write them all down. When your visit starts, your provider will ask you for this information. If you have an Apple Watch or Kardia device, please plan to have heart rate information ready on the day of your appointment. Please have a pen and paper handy nearby the day of the visit as well."  5. Give patient instructions for MyChart download to smartphone OR Doximity/Doxy.me as below if video visit (depending on what platform provider is using)  6. Inform patient they will receive a phone call 15 minutes prior to their appointment time (may be from unknown caller ID) so they should be prepared to answer    TELEPHONE CALL NOTE  Suzanne Spears has been deemed a candidate for a follow-up tele-health visit to limit community exposure during the Covid-19 pandemic. I spoke with the patient via  phone to ensure availability of phone/video source, confirm preferred email & phone number, and discuss instructions and expectations.  I reminded Suzanne Spears to be prepared with any vital sign and/or heart rhythm information that could potentially be obtained via home monitoring, at the time of her visit. I reminded Suzanne Spears to expect a phone call prior to her visit.  Alvis Lemmings, RN 07/01/2018 12:52 PM   INSTRUCTIONS FOR DOWNLOADING THE MYCHART APP TO SMARTPHONE  - The patient must first make sure to have activated MyChart and know their login information - If Apple, go  to [employer] and type in MyChart in the search bar and download the app. If Android, ask patient to go to [employer] and type in [place] in the search bar and download the app. The app is free but as with any other app downloads, their phone may require them to verify saved payment information or Apple/Android password.  - The patient will need to then log into the app with their MyChart username and password, and select [facility] as their healthcare provider to link the account. When it is time for your visit, go to the MyChart app, find appointments, and click Begin Video Visit. Be sure to Select Allow for your device to access the Microphone and Camera for your visit. You will then be connected, and your provider will be with you shortly.  **If they have any issues connecting, or need assistance please contact MyChart service desk [phone])**  **If using a computer, in order to ensure the best quality for their visit they will need to use either of the following Internet Browsers: [employer], or Google Chrome**  IF USING DOXIMITY or DOXY.ME - The patient will receive a link just prior to their visit by text.     FULL LENGTH CONSENT FOR TELE-HEALTH VISIT   I hereby voluntarily request, consent and authorize [place] and its employed or contracted physicians, physician assistants, nurse practitioners or other licensed health care professionals (the Practitioner), to provide me with telemedicine health care services (the "Services") as deemed necessary by the treating Practitioner. I acknowledge and consent to receive the Services by the Practitioner via telemedicine. I understand that the telemedicine visit will involve communicating with the Practitioner through live audiovisual communication technology and the disclosure of certain medical information by electronic transmission. I acknowledge that I have been given the opportunity to request an in-person  assessment or other available alternative prior to the telemedicine visit and am voluntarily participating in the telemedicine visit.  I understand that I have the right to withhold or withdraw my consent to the use of telemedicine in the course of my care at any time, without affecting my right to future care or treatment, and that the Practitioner or I may terminate the telemedicine visit at any time. I understand that I have the right to inspect all information obtained and/or recorded in the course of the telemedicine visit and may receive copies of available information for a reasonable fee.  I understand that some of the potential risks of receiving the Services via telemedicine include:  Marland Kitchen Delay or interruption in medical evaluation due to technological equipment failure or disruption; . Information transmitted may not be sufficient (e.g. poor resolution of images) to allow for appropriate medical decision making by the Practitioner; and/or  . In rare instances, security protocols could fail, causing a breach of personal health information.  Furthermore, I acknowledge that it is my responsibility  to provide information about my medical history, conditions and care that is complete and accurate to the best of my ability. I acknowledge that Practitioner's advice, recommendations, and/or decision may be based on factors not within their control, such as incomplete or inaccurate data provided by me or distortions of diagnostic images or specimens that may result from electronic transmissions. I understand that the practice of medicine is not an exact science and that Practitioner makes no warranties or guarantees regarding treatment outcomes. I acknowledge that I will receive a copy of this consent concurrently upon execution via email to the email address I last provided but may also request a printed copy by calling the office of [facility].    I understand that my insurance will be billed for this  visit.   I have read or had this consent read to me. . I understand the contents of this consent, which adequately explains the benefits and risks of the Services being provided via telemedicine.  . I have been provided ample opportunity to ask questions regarding this consent and the Services and have had my questions answered to my satisfaction. . I give my informed consent for the services to be provided through the use of telemedicine in my medical care  By participating in this telemedicine visit I agree to the above.

## 2018-07-07 ENCOUNTER — Other Ambulatory Visit: Payer: Self-pay

## 2018-07-07 ENCOUNTER — Telehealth (INDEPENDENT_AMBULATORY_CARE_PROVIDER_SITE_OTHER): Payer: BLUE CROSS/BLUE SHIELD | Admitting: Internal Medicine

## 2018-07-07 VITALS — BP 106/68 | HR 81 | Wt 156.2 lb

## 2018-07-07 DIAGNOSIS — I5022 Chronic systolic (congestive) heart failure: Secondary | ICD-10-CM | POA: Diagnosis not present

## 2018-07-07 DIAGNOSIS — Z95810 Presence of automatic (implantable) cardiac defibrillator: Secondary | ICD-10-CM

## 2018-07-07 DIAGNOSIS — I255 Ischemic cardiomyopathy: Secondary | ICD-10-CM
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
## 2018-07-14 NOTE — Telephone Encounter (Signed)
Pt was instructed to stop Arnuity for two weeks to assess asthma at last office visit.  Pt wanted to make DK aware that she had to resume arnuity.

## 2018-07-21 ENCOUNTER — Encounter: Payer: Self-pay | Admitting: *Deleted

## 2018-07-22 ENCOUNTER — Other Ambulatory Visit: Payer: Self-pay | Admitting: Internal Medicine

## 2018-08-02 ENCOUNTER — Ambulatory Visit (INDEPENDENT_AMBULATORY_CARE_PROVIDER_SITE_OTHER): Payer: BC Managed Care – PPO

## 2018-08-02 DIAGNOSIS — I5022 Chronic systolic (congestive) heart failure: Secondary | ICD-10-CM | POA: Diagnosis not present

## 2018-08-02 DIAGNOSIS — Z95810 Presence of automatic (implantable) cardiac defibrillator: Secondary | ICD-10-CM

## 2018-08-02 NOTE — Progress Notes (Signed)
EPIC Encounter for ICM Monitoring  Patient Name: Suzanne Spears is a 58 y.o. female Date: 08/02/2018 Primary Care Physican: [name], MD Primary Cardiologist: Rockey Situ Electrophysiologist: Vergie Living Pacing: 98.5% 3/30/2020Weight:161lbs 06/29/2018 Weight: 159 lbs    Heart Failure questions reviewed.Ptsymptomatic with slight swelling in feet.  Has not been able to weight recently but will start tomorrow.  She has been keeping her grandson so she has been out of her routine and diet.     OptiVol Thoracic impedanceabnormal suggesting fluid accumulation starting ~07/22/2018.  Prescribed:Furosemide 20 mgtake 1tablet (20 mg total)daily.Patient is taking Furosemide PRN and not daily.Extra dosing only as needed for weight gain or swelling or abdominal bloating(per Dr Donivan Scull OV note 09/17/2017).Potassium 10 mEq 1 tablet daily  Labs: 03/22/2018 Creatinine 1.29, BUN 20, Potassium 4.1, Sodium 140, GFR 42.62 07/09/2017 Creatinine 1.35, BUN 15, Potassium 4.3, Sodium 138, GFR 43.10 05/12/2017 Creatinine 1.38, BUN 19, Potassium 4.2, Sodium 141, GFR 42.04 03/31/2017 Creatinine 1.37, BUN 25, Potassium 3.8, Sodium 138, GFR 42.41  Recommendations: She took PRN Furosemide this morning and will take 1 tablet daily for next 2 days.    Follow-up plan: ICM clinic phone appointment on6/15/2020 (manual send) to recheck fluid levels.  Copy of ICM check sent to Dr.Klein and Dr Rockey Situ.  3 month ICM trend: 08/02/2018    1 Year ICM trend:       [name], RN 08/02/2018 10:01 AM

## 2018-08-09 ENCOUNTER — Ambulatory Visit (INDEPENDENT_AMBULATORY_CARE_PROVIDER_SITE_OTHER): Payer: BC Managed Care – PPO

## 2018-08-09 DIAGNOSIS — I5022 Chronic systolic (congestive) heart failure: Secondary | ICD-10-CM

## 2018-08-09 DIAGNOSIS — Z95810 Presence of automatic (implantable) cardiac defibrillator: Secondary | ICD-10-CM

## 2018-08-10 ENCOUNTER — Telehealth: Payer: Self-pay

## 2018-08-10 NOTE — Telephone Encounter (Signed)
Left message for patient to remind of missed remote transmission.  

## 2018-08-12 ENCOUNTER — Telehealth: Payer: Self-pay

## 2018-08-12 NOTE — Telephone Encounter (Signed)
Pt tried to send a manual transmission with her home monitor. Pt received the error code 3230. I told her to let the handle charge for 30 minutes and then try again. The pt verbalized understanding and thank me for my help.

## 2018-08-13 NOTE — Progress Notes (Signed)
EPIC Encounter for ICM Monitoring  Patient Name: Suzanne Spears is a 58 y.o. female Date: 08/13/2018 Primary Care Physican: [name], MD Primary Cardiologist: Rockey Situ Electrophysiologist: Vergie Living Pacing: 98.5% 3/30/2020Weight:161lbs 06/29/2018 Weight: 159 lbs    Transmission reviewed.     OptiVolThoracic impedance returned to normal since taking PRN Furosemide for 2 days.   Prescribed:Furosemide 20 mgtake 1tablet (20 mg total)daily.Patient is taking Furosemide PRN and not daily.Extra dosing only as needed for weight gain or swelling or abdominal bloating(per Dr Donivan Scull OV note 09/17/2017).Potassium 10 mEq 1 tablet daily  Labs: 03/22/2018 Creatinine 1.29, BUN 20, Potassium 4.1, Sodium 140, GFR 42.62 07/09/2017 Creatinine 1.35, BUN 15, Potassium 4.3, Sodium 138, GFR 43.10 05/12/2017 Creatinine 1.38, BUN 19, Potassium 4.2, Sodium 141, GFR 42.04 03/31/2017 Creatinine 1.37, BUN 25, Potassium 3.8, Sodium 138, GFR 42.41  Recommendations: None   Follow-up plan: ICM clinic phone appointment on7/11/2018.  Copy of ICM check sent to [facility].  3 month ICM trend: 08/13/2018    1 Year ICM trend:       [name], RN 08/13/2018 9:27 AM

## 2018-08-25 ENCOUNTER — Other Ambulatory Visit: Payer: Self-pay | Admitting: Internal Medicine

## 2018-09-02 ENCOUNTER — Ambulatory Visit (INDEPENDENT_AMBULATORY_CARE_PROVIDER_SITE_OTHER): Payer: BC Managed Care – PPO | Admitting: *Deleted

## 2018-09-02 DIAGNOSIS — I447 Left bundle-branch block, unspecified: Secondary | ICD-10-CM

## 2018-09-02 DIAGNOSIS — I5022 Chronic systolic (congestive) heart failure: Secondary | ICD-10-CM

## 2018-09-02 LAB — CUP PACEART REMOTE DEVICE CHECK
Battery Remaining Longevity: 24 mo
Battery Voltage: 2.93 V
Brady Statistic AP VP Percent: 0.03 %
Brady Statistic AP VS Percent: 0.01 %
Brady Statistic AS VP Percent: 98.58 %
Brady Statistic AS VS Percent: 1.38 %
Brady Statistic RA Percent Paced: 0.04 %
Brady Statistic RV Percent Paced: 1.84 %
Date Time Interrogation Session: 20200709041704
HighPow Impedance: 90 Ohm
Implantable Lead Implant Date: 20160421
Implantable Lead Implant Date: 20160421
Implantable Lead Implant Date: 20161006
Implantable Lead Implant Date: 20161006
Implantable Lead Location: 753858
Implantable Lead Location: 753858
Implantable Lead Location: 753859
Implantable Lead Location: 753860
Implantable Lead Model: 5071
Implantable Lead Model: 5071
Implantable Lead Model: 5076
Implantable Pulse Generator Implant Date: 20160421
Lead Channel Impedance Value: 323 Ohm
Lead Channel Impedance Value: 342 Ohm
Lead Channel Impedance Value: 399 Ohm
Lead Channel Impedance Value: 399 Ohm
Lead Channel Impedance Value: 4047 Ohm
Lead Channel Impedance Value: 4047 Ohm
Lead Channel Pacing Threshold Amplitude: 0.5 V
Lead Channel Pacing Threshold Amplitude: 1.375 V
Lead Channel Pacing Threshold Pulse Width: 0.4 ms
Lead Channel Pacing Threshold Pulse Width: 0.4 ms
Lead Channel Sensing Intrinsic Amplitude: 28.75 mV
Lead Channel Sensing Intrinsic Amplitude: 3.125 mV
Lead Channel Setting Pacing Amplitude: 1.5 V
Lead Channel Setting Pacing Amplitude: 2 V
Lead Channel Setting Pacing Amplitude: 2.5 V
Lead Channel Setting Pacing Pulse Width: 0.4 ms
Lead Channel Setting Pacing Pulse Width: 0.4 ms
Lead Channel Setting Sensing Sensitivity: 0.3 mV

## 2018-09-03 ENCOUNTER — Ambulatory Visit (INDEPENDENT_AMBULATORY_CARE_PROVIDER_SITE_OTHER): Payer: BC Managed Care – PPO

## 2018-09-03 DIAGNOSIS — I5022 Chronic systolic (congestive) heart failure: Secondary | ICD-10-CM

## 2018-09-03 DIAGNOSIS — Z95810 Presence of automatic (implantable) cardiac defibrillator: Secondary | ICD-10-CM

## 2018-09-06 NOTE — Progress Notes (Signed)
EPIC Encounter for ICM Monitoring  Patient Name: Suzanne Spears is a 58 y.o. female Date: 09/06/2018 Primary Care Physican: [name], MD Primary Cardiologist: Rockey Situ Electrophysiologist: Vergie Living Pacing: 98.5% 09/06/2018 Weight: 156 lbs      Heart failure questions reviewed. She denies any fluid symptoms and is feeling fine.  Advised to make yearly office visit appointment with Dr Rockey Situ (recall for 09/17/2018) and she will do that today.    OptiVolThoracic impedance normal.   Prescribed:Furosemide 20 mgtake 1tablet (20 mg total)daily.Patient is taking Furosemide PRN and not daily.Extra dosing only as needed for weight gain or swelling or abdominal bloating(per Dr Donivan Scull OV note 09/17/2017).Potassium 10 mEq 1 tablet daily  Labs: 03/22/2018 Creatinine 1.29, BUN 20, Potassium 4.1, Sodium 140, GFR 42.62 07/09/2017 Creatinine 1.35, BUN 15, Potassium 4.3, Sodium 138, GFR 43.10 05/12/2017 Creatinine 1.38, BUN 19, Potassium 4.2, Sodium 141, GFR 42.04 03/31/2017 Creatinine 1.37, BUN 25, Potassium 3.8, Sodium 138, GFR 42.41  Recommendations: No changes and encouraged to call if she experiences fluid symptoms.    Follow-up plan: ICM clinic phone appointment on8/13/2020.  Copy of ICM check sent to [facility].   3 month ICM trend: 09/03/2018    1 Year ICM trend:       [name], RN 09/06/2018 12:03 PM

## 2018-09-08 NOTE — Progress Notes (Signed)
Remote ICD transmission.   

## 2018-09-20 ENCOUNTER — Ambulatory Visit: Payer: BLUE CROSS/BLUE SHIELD | Admitting: Internal Medicine

## 2018-09-29 ENCOUNTER — Encounter: Payer: Self-pay | Admitting: Internal Medicine

## 2018-09-29 ENCOUNTER — Ambulatory Visit (INDEPENDENT_AMBULATORY_CARE_PROVIDER_SITE_OTHER): Payer: BC Managed Care – PPO | Admitting: Internal Medicine

## 2018-09-29 VITALS — BP 120/76 | HR 69 | Ht 65.5 in | Wt 154.6 lb

## 2018-09-29 DIAGNOSIS — I2510 Atherosclerotic heart disease of native coronary artery without angina pectoris: Secondary | ICD-10-CM | POA: Diagnosis not present

## 2018-09-29 DIAGNOSIS — E782 Mixed hyperlipidemia: Secondary | ICD-10-CM | POA: Diagnosis not present

## 2018-09-29 DIAGNOSIS — F5104 Psychophysiologic insomnia: Secondary | ICD-10-CM

## 2018-09-29 DIAGNOSIS — R5382 Chronic fatigue, unspecified: Secondary | ICD-10-CM | POA: Diagnosis not present

## 2018-09-29 DIAGNOSIS — I420 Dilated cardiomyopathy: Secondary | ICD-10-CM

## 2018-09-29 DIAGNOSIS — F418 Other specified anxiety disorders: Secondary | ICD-10-CM

## 2018-09-29 DIAGNOSIS — I10 Essential (primary) hypertension: Secondary | ICD-10-CM

## 2018-09-29 DIAGNOSIS — J4520 Mild intermittent asthma, uncomplicated: Secondary | ICD-10-CM

## 2018-09-29 DIAGNOSIS — R0683 Snoring: Secondary | ICD-10-CM

## 2018-09-29 DIAGNOSIS — E538 Deficiency of other specified B group vitamins: Secondary | ICD-10-CM | POA: Diagnosis not present

## 2018-09-29 MED ORDER — TRAMADOL HCL 50 MG PO TABS: ORAL_TABLET | ORAL | 5 refills | Status: DC
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
## 2018-09-30 NOTE — Assessment & Plan Note (Signed)
Symptoms have greatly improved since last visit on citalopram to 20 mg daily  And cymbalta 60 mg daily   No changes  today .

## 2018-09-30 NOTE — Assessment & Plan Note (Addendum)
She has improved energy level most days and minimal edema  Sine her A1CD was placed.

## 2018-09-30 NOTE — Assessment & Plan Note (Signed)
Managed with trazodone

## 2018-09-30 NOTE — Assessment & Plan Note (Signed)
She has had no exacerbations since working from home.

## 2018-09-30 NOTE — Assessment & Plan Note (Signed)
Sleep study march 2019 was negative for OSA

## 2018-09-30 NOTE — Assessment & Plan Note (Signed)
Improved with working from home

## 2018-10-05 ENCOUNTER — Other Ambulatory Visit: Payer: Self-pay | Admitting: Internal Medicine

## 2018-10-07 ENCOUNTER — Ambulatory Visit (INDEPENDENT_AMBULATORY_CARE_PROVIDER_SITE_OTHER): Payer: BC Managed Care – PPO

## 2018-10-07 DIAGNOSIS — Z95810 Presence of automatic (implantable) cardiac defibrillator: Secondary | ICD-10-CM | POA: Diagnosis not present

## 2018-10-07 DIAGNOSIS — I5022 Chronic systolic (congestive) heart failure: Secondary | ICD-10-CM

## 2018-10-08 NOTE — Progress Notes (Signed)
EPIC Encounter for ICM Monitoring  Patient Name: Suzanne Spears is a 58 y.o. female Date: 10/08/2018 Primary Care Physican: [name], MD Primary Cardiologist: Rockey Situ Electrophysiologist: Vergie Living Pacing: 98.5% 09/06/2018 Weight: 156 lbs      Heart failure questions reviewed. She denies any fluid symptoms and is feeling fine.    OptiVolThoracic impedancenormal.  Prescribed:Furosemide 20 mgtake 1tablet (20 mg total)daily.Patient is taking Furosemide PRN and not daily.Extra dosing only as needed for weight gain or swelling or abdominal bloating(per Dr Donivan Scull OV note 09/17/2017).Potassium 10 mEq 1 tablet daily  Labs: 03/22/2018 Creatinine 1.29, BUN 20, Potassium 4.1, Sodium 140, GFR 42.62 07/09/2017 Creatinine 1.35, BUN 15, Potassium 4.3, Sodium 138, GFR 43.10 05/12/2017 Creatinine 1.38, BUN 19, Potassium 4.2, Sodium 141, GFR 42.04 03/31/2017 Creatinine 1.37, BUN 25, Potassium 3.8, Sodium 138, GFR 42.41  Recommendations: No changes and encouraged to call if she experiences fluid symptoms.    Follow-up plan: ICM clinic phone appointment on10/01/2019.  Copy of ICM check sent to [facility].    3 month ICM trend: 10/07/2018    1 Year ICM trend:       [name], RN 10/08/2018 1:18 PM

## 2018-10-13 ENCOUNTER — Other Ambulatory Visit: Payer: BC Managed Care – PPO

## 2018-10-19 DIAGNOSIS — M5481 Occipital neuralgia: Secondary | ICD-10-CM | POA: Insufficient documentation

## 2018-10-20 ENCOUNTER — Other Ambulatory Visit: Payer: Self-pay | Admitting: Internal Medicine

## 2018-10-28 ENCOUNTER — Other Ambulatory Visit: Payer: Self-pay

## 2018-10-28 ENCOUNTER — Other Ambulatory Visit (INDEPENDENT_AMBULATORY_CARE_PROVIDER_SITE_OTHER): Payer: BC Managed Care – PPO

## 2018-10-28 DIAGNOSIS — E782 Mixed hyperlipidemia: Secondary | ICD-10-CM | POA: Diagnosis not present

## 2018-10-28 DIAGNOSIS — E538 Deficiency of other specified B group vitamins: Secondary | ICD-10-CM | POA: Diagnosis not present

## 2018-10-28 DIAGNOSIS — I10 Essential (primary) hypertension: Secondary | ICD-10-CM | POA: Diagnosis not present

## 2018-10-28 DIAGNOSIS — R5382 Chronic fatigue, unspecified: Secondary | ICD-10-CM | POA: Diagnosis not present

## 2018-10-28 LAB — CBC WITH DIFFERENTIAL/PLATELET
Basophils Absolute: 0 10*3/uL (ref 0.0–0.1)
Basophils Relative: 0.4 % (ref 0.0–3.0)
Eosinophils Absolute: 0 10*3/uL (ref 0.0–0.7)
Eosinophils Relative: 0.2 % (ref 0.0–5.0)
HCT: 38.8 % (ref 36.0–46.0)
Hemoglobin: 12.6 g/dL (ref 12.0–15.0)
Lymphocytes Relative: 21.5 % (ref 12.0–46.0)
Lymphs Abs: 1.7 10*3/uL (ref 0.7–4.0)
MCHC: 32.5 g/dL (ref 30.0–36.0)
MCV: 94.3 fl (ref 78.0–100.0)
Monocytes Absolute: 0.6 10*3/uL (ref 0.1–1.0)
Monocytes Relative: 7 % (ref 3.0–12.0)
Neutro Abs: 5.7 10*3/uL (ref 1.4–7.7)
Neutrophils Relative %: 70.9 % (ref 43.0–77.0)
Platelets: 197 10*3/uL (ref 150.0–400.0)
RBC: 4.11 Mil/uL (ref 3.87–5.11)
RDW: 13.8 % (ref 11.5–15.5)
WBC: 8 10*3/uL (ref 4.0–10.5)

## 2018-10-28 LAB — COMPREHENSIVE METABOLIC PANEL
ALT: 23 U/L (ref 0–35)
AST: 26 U/L (ref 0–37)
Albumin: 4.1 g/dL (ref 3.5–5.2)
Alkaline Phosphatase: 108 U/L (ref 39–117)
BUN: 28 mg/dL — ABNORMAL HIGH (ref 6–23)
CO2: 24 mEq/L (ref 19–32)
Calcium: 9.5 mg/dL (ref 8.4–10.5)
Chloride: 111 mEq/L (ref 96–112)
Creatinine, Ser: 1.23 mg/dL — ABNORMAL HIGH (ref 0.40–1.20)
GFR: 44.94 mL/min — ABNORMAL LOW (ref >60.00)
Glucose, Bld: 97 mg/dL (ref 70–99)
Potassium: 3.8 mEq/L (ref 3.5–5.1)
Sodium: 143 mEq/L (ref 135–145)
Total Bilirubin: 0.3 mg/dL (ref 0.2–1.2)
Total Protein: 6.7 g/dL (ref 6.0–8.3)

## 2018-10-28 LAB — LIPID PANEL
Cholesterol: 242 mg/dL — ABNORMAL HIGH (ref 0–200)
HDL: 51.8 mg/dL (ref >39.00)
LDL Cholesterol: 173 mg/dL — ABNORMAL HIGH (ref 0–99)
NonHDL: 190.54
Total CHOL/HDL Ratio: 5
Triglycerides: 90 mg/dL (ref 0.0–149.0)
VLDL: 18 mg/dL (ref 0.0–40.0)

## 2018-10-28 LAB — VITAMIN B12: Vitamin B-12: 940 pg/mL — ABNORMAL HIGH (ref 211–911)

## 2018-10-28 LAB — TSH: TSH: 0.65 u[IU]/mL (ref 0.35–4.50)

## 2018-10-31 ENCOUNTER — Encounter: Payer: Self-pay | Admitting: Internal Medicine

## 2018-10-31 DIAGNOSIS — N182 Chronic kidney disease, stage 2 (mild): Secondary | ICD-10-CM | POA: Insufficient documentation

## 2018-11-03 ENCOUNTER — Other Ambulatory Visit: Payer: Self-pay | Admitting: Neurosurgery

## 2018-11-03 DIAGNOSIS — M4807 Spinal stenosis, lumbosacral region: Secondary | ICD-10-CM

## 2018-11-03 DIAGNOSIS — G959 Disease of spinal cord, unspecified: Secondary | ICD-10-CM

## 2018-11-10 ENCOUNTER — Other Ambulatory Visit: Payer: Self-pay | Admitting: Internal Medicine

## 2018-11-10 DIAGNOSIS — Z1231 Encounter for screening mammogram for malignant neoplasm of breast: Secondary | ICD-10-CM

## 2018-11-11 ENCOUNTER — Ambulatory Visit
Admission: RE | Admit: 2018-11-11 | Discharge: 2018-11-11 | Disposition: A | Discharge status: Home / Self Care | Payer: BC Managed Care – PPO | Source: Ambulatory Visit | Attending: Neurosurgery | Admitting: Neurosurgery

## 2018-11-11 ENCOUNTER — Other Ambulatory Visit: Payer: Self-pay | Admitting: Neurosurgery

## 2018-11-11 ENCOUNTER — Other Ambulatory Visit: Payer: Self-pay

## 2018-11-11 ENCOUNTER — Other Ambulatory Visit: Payer: Self-pay | Admitting: Internal Medicine

## 2018-11-11 DIAGNOSIS — G959 Disease of spinal cord, unspecified: Secondary | ICD-10-CM

## 2018-11-11 DIAGNOSIS — Z981 Arthrodesis status: Secondary | ICD-10-CM | POA: Insufficient documentation

## 2018-11-11 DIAGNOSIS — M47812 Spondylosis without myelopathy or radiculopathy, cervical region: Secondary | ICD-10-CM | POA: Insufficient documentation

## 2018-11-11 DIAGNOSIS — M545 Low back pain, unspecified: Secondary | ICD-10-CM

## 2018-11-11 DIAGNOSIS — M47897 Other spondylosis, lumbosacral region: Secondary | ICD-10-CM | POA: Diagnosis not present

## 2018-11-11 DIAGNOSIS — M4807 Spinal stenosis, lumbosacral region: Secondary | ICD-10-CM

## 2018-11-11 DIAGNOSIS — I429 Cardiomyopathy, unspecified: Secondary | ICD-10-CM

## 2018-11-11 DIAGNOSIS — M542 Cervicalgia: Secondary | ICD-10-CM

## 2018-11-11 MED ORDER — IOHEXOL 300 MG/ML  SOLN
10.0000 mL | Freq: Once | INTRAMUSCULAR | Status: AC | PRN
  Administered 2018-11-11: 10 mL

## 2018-11-11 MED ORDER — LIDOCAINE HCL (PF) 1 % IJ SOLN
5.0000 mL | Freq: Once | INTRAMUSCULAR | Status: AC
  Administered 2018-11-11: 5 mL
  Filled 2018-11-11: qty 5

## 2018-11-11 MED ORDER — OXYCODONE-ACETAMINOPHEN 5-325 MG PO TABS
ORAL_TABLET | ORAL | Status: AC
  Administered 2018-11-11: 1 via ORAL
  Filled 2018-11-11: qty 1

## 2018-11-11 MED ORDER — OXYCODONE-ACETAMINOPHEN 5-325 MG PO TABS
1.0000 | ORAL_TABLET | Freq: Once | ORAL | Status: AC
  Administered 2018-11-11 (×2): 1 via ORAL
  Filled 2018-11-11: qty 1

## 2018-11-11 MED ORDER — IOHEXOL 300 MG/ML  SOLN: 10.0000 mL | Freq: Once | INTRAMUSCULAR | Status: DC | PRN

## 2018-11-11 NOTE — Progress Notes (Signed)
Pt stable after myelogram.Back stable.D/C instructions given.F/U with her M.D.

## 2018-11-11 NOTE — Progress Notes (Signed)
C/O slight neck pain, 2/10, states she feels ok with going home.  No dizzyness or lightheadedness on standing.  Voided.  DIscharge instructions reviewed, verbalized understanding.  Discharged via wheelchair.  Husband taking her home.

## 2018-11-11 NOTE — Progress Notes (Signed)
Dr register in to see  Patient. Stable with no issues except mild headache.  Pt to be discharged after 4 hour time frame of bedrest if continues to be stable.

## 2018-11-11 NOTE — Discharge Instructions (Signed)
Myelogram and Lumbar Puncture Discharge Instructions  1. Go home and rest quietly for the next 24 hours.  It is important to lie flat for the next 24 hours.  Get up only to go to the restroom.  You may lie in the bed or on a couch on your back, your stomach, your left side or your right side.  You may have one pillow under your head.  You may have pillows between your knees while you are on your side or under your knees while you are on your back.  2. DO NOT drive today.  Recline the seat as far back as it will go, while still wearing your seat belt, on the way home.  3. You may get up to go to the bathroom as needed.  You may sit up for 10 minutes to eat.  You may resume your normal diet and medications unless otherwise indicated.  4. The incidence of headache, nausea, or vomiting is about 5% (one in 20 patients).  If you develop a headache, lie flat and drink plenty of fluids until the headache goes away.  Caffeinated beverages may be helpful.  If you develop severe nausea and vomiting or a headache that does not go away with flat bed rest, call your MD.  5. You may resume normal activities after your 24 hours of bed rest is over; however, do not exert yourself strongly or do any heavy lifting tomorrow.  6. Call your physician for a follow-up appointment.  The results of your myelogram will be sent directly to your physician by the following day.  If you have any questions or if complications develop after you arrive home, please call [phone] Discharge instructions have been explained to the patient.  The patient, or the person responsible for the patient, fully understands these instructions.

## 2018-11-24 ENCOUNTER — Ambulatory Visit
Admission: RE | Admit: 2018-11-24 | Discharge: 2018-11-24 | Disposition: A | Discharge status: Home / Self Care | Payer: BC Managed Care – PPO | Source: Ambulatory Visit | Attending: Internal Medicine | Admitting: Internal Medicine

## 2018-11-24 ENCOUNTER — Other Ambulatory Visit: Payer: Self-pay

## 2018-11-24 ENCOUNTER — Other Ambulatory Visit: Payer: Self-pay | Admitting: Internal Medicine

## 2018-11-24 DIAGNOSIS — Z1231 Encounter for screening mammogram for malignant neoplasm of breast: Secondary | ICD-10-CM | POA: Insufficient documentation

## 2018-11-24 DIAGNOSIS — R928 Other abnormal and inconclusive findings on diagnostic imaging of breast: Secondary | ICD-10-CM

## 2018-11-24 DIAGNOSIS — R921 Mammographic calcification found on diagnostic imaging of breast: Secondary | ICD-10-CM

## 2018-12-02 ENCOUNTER — Telehealth: Payer: Self-pay | Admitting: Internal Medicine

## 2018-12-02 NOTE — Telephone Encounter (Signed)
Pt is getting pneumonia shot at the pharmacy and the pharmacy asked the Pt to ask Dr. Derrel Nip if she can get the Prevnar 13, which she will need a Rx for or if she should get the pneumovax 23, the same as she has had before/ Please advise and if Prevnar 13 is recommended please send Rx for it to  [facility], [place] [facility] [place] [phone] (Phone) [phone] (Fax)

## 2018-12-02 NOTE — Telephone Encounter (Signed)
rx for prevnar and MyChart message sent

## 2018-12-03 ENCOUNTER — Ambulatory Visit (INDEPENDENT_AMBULATORY_CARE_PROVIDER_SITE_OTHER): Payer: BC Managed Care – PPO | Admitting: *Deleted

## 2018-12-03 DIAGNOSIS — I5032 Chronic diastolic (congestive) heart failure: Secondary | ICD-10-CM

## 2018-12-03 DIAGNOSIS — I5022 Chronic systolic (congestive) heart failure: Secondary | ICD-10-CM

## 2018-12-03 LAB — CUP PACEART REMOTE DEVICE CHECK
Battery Remaining Longevity: 23 mo
Battery Voltage: 2.93 V
Brady Statistic AP VP Percent: 0.02 %
Brady Statistic AP VS Percent: 0.01 %
Brady Statistic AS VP Percent: 98.61 %
Brady Statistic AS VS Percent: 1.35 %
Brady Statistic RA Percent Paced: 0.04 %
Brady Statistic RV Percent Paced: 1.88 %
Date Time Interrogation Session: 20201009041603
HighPow Impedance: 89 Ohm
Implantable Lead Implant Date: 20160421
Implantable Lead Implant Date: 20160421
Implantable Lead Implant Date: 20161006
Implantable Lead Implant Date: 20161006
Implantable Lead Location: 753858
Implantable Lead Location: 753858
Implantable Lead Location: 753859
Implantable Lead Location: 753860
Implantable Lead Model: 5071
Implantable Lead Model: 5071
Implantable Lead Model: 5076
Implantable Pulse Generator Implant Date: 20160421
Lead Channel Impedance Value: 323 Ohm
Lead Channel Impedance Value: 323 Ohm
Lead Channel Impedance Value: 380 Ohm
Lead Channel Impedance Value: 399 Ohm
Lead Channel Impedance Value: 4047 Ohm
Lead Channel Impedance Value: 4047 Ohm
Lead Channel Pacing Threshold Amplitude: 0.5 V
Lead Channel Pacing Threshold Amplitude: 1.25 V
Lead Channel Pacing Threshold Pulse Width: 0.4 ms
Lead Channel Pacing Threshold Pulse Width: 0.4 ms
Lead Channel Sensing Intrinsic Amplitude: 28.25 mV
Lead Channel Sensing Intrinsic Amplitude: 28.25 mV
Lead Channel Sensing Intrinsic Amplitude: 3.375 mV
Lead Channel Sensing Intrinsic Amplitude: 3.375 mV
Lead Channel Setting Pacing Amplitude: 1.5 V
Lead Channel Setting Pacing Amplitude: 2 V
Lead Channel Setting Pacing Amplitude: 2.5 V
Lead Channel Setting Pacing Pulse Width: 0.4 ms
Lead Channel Setting Pacing Pulse Width: 0.4 ms
Lead Channel Setting Sensing Sensitivity: 0.3 mV

## 2018-12-03 MED ORDER — PNEUMOCOCCAL 13-VAL CONJ VACC IM SUSP: INTRAMUSCULAR | 0 refills | Status: DC

## 2018-12-03 NOTE — Telephone Encounter (Signed)
refaxed rx and pt is aware that the rx has been faxed again.

## 2018-12-03 NOTE — Telephone Encounter (Signed)
Pt said [facility]  has not received fax please refax

## 2018-12-06 ENCOUNTER — Ambulatory Visit (INDEPENDENT_AMBULATORY_CARE_PROVIDER_SITE_OTHER): Payer: BC Managed Care – PPO

## 2018-12-06 DIAGNOSIS — Z95810 Presence of automatic (implantable) cardiac defibrillator: Secondary | ICD-10-CM | POA: Diagnosis not present

## 2018-12-06 DIAGNOSIS — I5022 Chronic systolic (congestive) heart failure: Secondary | ICD-10-CM

## 2018-12-07 ENCOUNTER — Other Ambulatory Visit: Payer: Self-pay | Admitting: Internal Medicine

## 2018-12-07 ENCOUNTER — Ambulatory Visit
Admission: RE | Admit: 2018-12-07 | Discharge: 2018-12-07 | Disposition: A | Discharge status: Home / Self Care | Payer: BC Managed Care – PPO | Source: Ambulatory Visit | Attending: Internal Medicine | Admitting: Internal Medicine

## 2018-12-07 DIAGNOSIS — R928 Other abnormal and inconclusive findings on diagnostic imaging of breast: Secondary | ICD-10-CM | POA: Insufficient documentation

## 2018-12-07 DIAGNOSIS — R921 Mammographic calcification found on diagnostic imaging of breast: Secondary | ICD-10-CM

## 2018-12-07 NOTE — Progress Notes (Signed)
EPIC Encounter for ICM Monitoring  Patient Name: Suzanne Spears is a 58 y.o. female Date: 12/07/2018 Primary Care Physican: [name], MD Primary Cardiologist: Rockey Situ Electrophysiologist: Vergie Living Pacing: 98.6% 10/11/2018 Weight: 158 lbs   Heart failure questions reviewed. She is doing well and denies fluid symptoms.   OptiVolThoracic impedancenormal.  Prescribed:Furosemide 20 mgtake 2tablets (40 mg total) every other day alternating with 1 tablet (20 mg total) every other day. Potassium 10 mEq 1 tablet daily  Labs: 10/28/2018 Creatinine 1.23, BUN 28, Potassium 3.8, Sodium 143, GFR 44.94 A complete set of results can be found in Results Review.  Recommendations: No changes and encouraged to call if experiencing any fluid symptoms.  Follow-up plan: ICM clinic phone appointment on 01/10/2019.   91 day device clinic remote transmission 03/04/2019.    Copy of ICM check sent to Dr. Caryl Comes.   3 month ICM trend: 12/03/2018    1 Year ICM trend:       [name], RN 12/07/2018 9:55 AM

## 2018-12-09 ENCOUNTER — Telehealth: Payer: Self-pay | Admitting: Internal Medicine

## 2018-12-09 NOTE — Telephone Encounter (Signed)
Spoke with pt to let her know that the orders have been placed and were placed on 12/07/2018 and that she will just need to call [facility] to have the appt scheduled. Also told pt that she shouldn't have to cancel any appts as long as the biopsy appt doesn't fall on the same day. Pt gave a verbal understanding.

## 2018-12-09 NOTE — Telephone Encounter (Signed)
Copied from [place] [id number]. Topic: General - Other >> Dec 09, 2018 10:22 AM Keene Breath wrote: Reason for CRM: Patient called to speak with the nurse regarding a breast biopsy.  She has other appts. Scheduled and would like  to know if she should cancel her upcoming appts.   Please call to discuss.  CB# [phone]

## 2018-12-11 ENCOUNTER — Other Ambulatory Visit: Payer: Self-pay | Admitting: Internal Medicine

## 2018-12-11 MED ORDER — ALPRAZOLAM 0.25 MG PO TABS: 0.2500 mg | ORAL_TABLET | Freq: Two times a day (BID) | ORAL | 0 refills | Status: DC | PRN

## 2018-12-13 NOTE — Progress Notes (Signed)
Remote ICD transmission.   

## 2018-12-16 DIAGNOSIS — I2510 Atherosclerotic heart disease of native coronary artery without angina pectoris: Secondary | ICD-10-CM | POA: Insufficient documentation

## 2018-12-16 DIAGNOSIS — R809 Proteinuria, unspecified: Secondary | ICD-10-CM | POA: Insufficient documentation

## 2018-12-17 ENCOUNTER — Ambulatory Visit
Admission: RE | Admit: 2018-12-17 | Discharge: 2018-12-17 | Disposition: A | Discharge status: Home / Self Care | Payer: BC Managed Care – PPO | Source: Ambulatory Visit | Attending: Internal Medicine | Admitting: Internal Medicine

## 2018-12-17 ENCOUNTER — Other Ambulatory Visit: Payer: Self-pay

## 2018-12-17 DIAGNOSIS — R928 Other abnormal and inconclusive findings on diagnostic imaging of breast: Secondary | ICD-10-CM

## 2018-12-17 DIAGNOSIS — R921 Mammographic calcification found on diagnostic imaging of breast: Secondary | ICD-10-CM | POA: Insufficient documentation

## 2018-12-17 HISTORY — PX: BREAST BIOPSY: SHX20

## 2018-12-20 LAB — SURGICAL PATHOLOGY

## 2019-01-10 ENCOUNTER — Ambulatory Visit (INDEPENDENT_AMBULATORY_CARE_PROVIDER_SITE_OTHER): Payer: BC Managed Care – PPO

## 2019-01-10 DIAGNOSIS — Z95810 Presence of automatic (implantable) cardiac defibrillator: Secondary | ICD-10-CM

## 2019-01-10 DIAGNOSIS — I5022 Chronic systolic (congestive) heart failure: Secondary | ICD-10-CM

## 2019-01-10 NOTE — Progress Notes (Signed)
EPIC Encounter for ICM Monitoring  Patient Name: Suzanne Spears is a 58 y.o. female Date: 01/10/2019 Primary Care Physican: [name], MD Primary Cardiologist: Rockey Situ Electrophysiologist: Vergie Living Pacing: 98.5% 01/10/2019 Weight: 154 lbs   Heart failure questions reviewed. She is doing well and denies fluid symptoms.   OptiVolThoracic impedance suggestive of possible ongoing fluid accumulation since 11/4.  Prescribed:Furosemide 20 mgtake 2tablets (40 mg total) every other day alternating with 1 tablet (20 mg total) every other day. Potassium 10 mEq 1 tablet daily  Labs: 10/28/2018 Creatinine 1.23, BUN 28, Potassium 3.8, Sodium 143, GFR 44.94 A complete set of results can be found in Results Review.  Recommendations: Reinforced limiting salt intake to < 2000 mg daily and fluid intake to 64 oz daily.  Encouraged to call if experiencing fluid symptoms.  Follow-up plan: ICM clinic phone appointment on 01/18/2019 to recheck fluid levels.   91 day device clinic remote transmission 03/04/2019.    Copy of ICM check sent to Dr. Caryl Comes and Dr Rockey Situ.   3 month ICM trend: 01/10/2019    1 Year ICM trend:       [name], RN 01/10/2019 1:13 PM

## 2019-01-19 ENCOUNTER — Telehealth: Payer: Self-pay

## 2019-01-19 NOTE — Progress Notes (Signed)
No ICM remote transmission received for 01/18/2019 and next ICM transmission scheduled for 03/07/2019.

## 2019-01-19 NOTE — Telephone Encounter (Signed)
Left message for patient to remind of missed remote transmission.  

## 2019-01-24 ENCOUNTER — Other Ambulatory Visit: Payer: Self-pay | Admitting: Internal Medicine

## 2019-01-26 ENCOUNTER — Encounter: Payer: Self-pay | Admitting: Cardiovascular Disease

## 2019-01-26 NOTE — Telephone Encounter (Signed)
Scanned to releases forms, money order , and release form.

## 2019-01-26 NOTE — Telephone Encounter (Signed)
Recieved request from : Patient at the office   Forwarded to [employer] for processing Faxed copy to expedite per patient request

## 2019-01-27 ENCOUNTER — Other Ambulatory Visit: Payer: Self-pay | Admitting: Cardiovascular Disease

## 2019-01-27 DIAGNOSIS — I5032 Chronic diastolic (congestive) heart failure: Secondary | ICD-10-CM

## 2019-01-31 NOTE — Telephone Encounter (Addendum)
This encounter was created in error - please disregard.

## 2019-01-31 NOTE — Telephone Encounter (Signed)
Received forms from [facility] for physician to complete, placed in MA Box

## 2019-02-07 NOTE — Telephone Encounter (Signed)
Received completed forms from physician, sent to [employer], also left copy for patient to pick up up front. Called patient and made aware forms were complete. Patient verbalized understanding

## 2019-02-07 NOTE — Telephone Encounter (Signed)
[employer] forms completed and placed on Suzanne Spears's desk for further processing. Tks

## 2019-02-07 NOTE — Telephone Encounter (Signed)
Patient calling to check status of completion for forms.  Patient states forms have been here waiting for signature for over a week.  Please call when she can pick them up DO NOT [place].  Patient would like asap.
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
## 2019-02-09 ENCOUNTER — Telehealth (INDEPENDENT_AMBULATORY_CARE_PROVIDER_SITE_OTHER): Payer: BC Managed Care – PPO | Admitting: Cardiovascular Disease

## 2019-02-09 ENCOUNTER — Encounter: Payer: Self-pay | Admitting: Cardiovascular Disease

## 2019-02-09 ENCOUNTER — Other Ambulatory Visit: Payer: Self-pay

## 2019-02-09 VITALS — BP 108/72 | HR 79 | Ht 65.5 in | Wt 154.0 lb

## 2019-02-09 DIAGNOSIS — I255 Ischemic cardiomyopathy: Secondary | ICD-10-CM

## 2019-02-09 DIAGNOSIS — Z95810 Presence of automatic (implantable) cardiac defibrillator: Secondary | ICD-10-CM | POA: Diagnosis not present

## 2019-02-09 DIAGNOSIS — I5022 Chronic systolic (congestive) heart failure: Secondary | ICD-10-CM

## 2019-02-09 DIAGNOSIS — I5032 Chronic diastolic (congestive) heart failure: Secondary | ICD-10-CM

## 2019-02-09 DIAGNOSIS — I2699 Other pulmonary embolism without acute cor pulmonale: Secondary | ICD-10-CM

## 2019-02-09 DIAGNOSIS — R600 Localized edema: Secondary | ICD-10-CM

## 2019-02-09 DIAGNOSIS — I447 Left bundle-branch block, unspecified: Secondary | ICD-10-CM

## 2019-02-09 NOTE — Patient Instructions (Signed)

## 2019-02-19 ENCOUNTER — Other Ambulatory Visit: Payer: Self-pay | Admitting: Internal Medicine

## 2019-03-04 ENCOUNTER — Ambulatory Visit (INDEPENDENT_AMBULATORY_CARE_PROVIDER_SITE_OTHER): Payer: BC Managed Care – PPO | Admitting: *Deleted

## 2019-03-04 DIAGNOSIS — I5032 Chronic diastolic (congestive) heart failure: Secondary | ICD-10-CM

## 2019-03-04 LAB — CUP PACEART REMOTE DEVICE CHECK
Battery Remaining Longevity: 23 mo
Battery Voltage: 2.92 V
Brady Statistic AP VP Percent: 0.04 %
Brady Statistic AP VS Percent: 0.01 %
Brady Statistic AS VP Percent: 98.57 %
Brady Statistic AS VS Percent: 1.38 %
Brady Statistic RA Percent Paced: 0.05 %
Brady Statistic RV Percent Paced: 1.08 %
Date Time Interrogation Session: 20210108033424
HighPow Impedance: 87 Ohm
Implantable Lead Implant Date: 20160421
Implantable Lead Implant Date: 20160421
Implantable Lead Implant Date: 20161006
Implantable Lead Implant Date: 20161006
Implantable Lead Location: 753858
Implantable Lead Location: 753858
Implantable Lead Location: 753859
Implantable Lead Location: 753860
Implantable Lead Model: 5071
Implantable Lead Model: 5071
Implantable Lead Model: 5076
Implantable Pulse Generator Implant Date: 20160421
Lead Channel Impedance Value: 323 Ohm
Lead Channel Impedance Value: 323 Ohm
Lead Channel Impedance Value: 380 Ohm
Lead Channel Impedance Value: 380 Ohm
Lead Channel Impedance Value: 4047 Ohm
Lead Channel Impedance Value: 4047 Ohm
Lead Channel Pacing Threshold Amplitude: 0.5 V
Lead Channel Pacing Threshold Amplitude: 1.375 V
Lead Channel Pacing Threshold Pulse Width: 0.4 ms
Lead Channel Pacing Threshold Pulse Width: 0.4 ms
Lead Channel Sensing Intrinsic Amplitude: 29.125 mV
Lead Channel Sensing Intrinsic Amplitude: 29.125 mV
Lead Channel Sensing Intrinsic Amplitude: 4.25 mV
Lead Channel Sensing Intrinsic Amplitude: 4.25 mV
Lead Channel Setting Pacing Amplitude: 1.5 V
Lead Channel Setting Pacing Amplitude: 2 V
Lead Channel Setting Pacing Amplitude: 2.5 V
Lead Channel Setting Pacing Pulse Width: 0.4 ms
Lead Channel Setting Pacing Pulse Width: 0.4 ms
Lead Channel Setting Sensing Sensitivity: 0.3 mV

## 2019-03-07 ENCOUNTER — Telehealth: Payer: Self-pay | Admitting: Cardiovascular Disease

## 2019-03-07 ENCOUNTER — Ambulatory Visit (INDEPENDENT_AMBULATORY_CARE_PROVIDER_SITE_OTHER): Payer: BC Managed Care – PPO

## 2019-03-07 ENCOUNTER — Telehealth: Payer: Self-pay | Admitting: Internal Medicine

## 2019-03-07 DIAGNOSIS — Z95810 Presence of automatic (implantable) cardiac defibrillator: Secondary | ICD-10-CM

## 2019-03-07 DIAGNOSIS — I5022 Chronic systolic (congestive) heart failure: Secondary | ICD-10-CM | POA: Diagnosis not present

## 2019-03-07 NOTE — Telephone Encounter (Signed)
Patient calling Patient has been exposed to [place] and wants to know if Dr Rockey Situ has any advice on what to look out for due to her condition  Please call to discuss

## 2019-03-07 NOTE — Telephone Encounter (Signed)
Per Suzanne Spears pt was advised to get tested and has been scheduled for a virtual visit on Friday at 4pm.

## 2019-03-07 NOTE — Telephone Encounter (Signed)
Pt has been exposed to [place]. She would like to know what she should do. Gave patient infor on getting testied.

## 2019-03-07 NOTE — Telephone Encounter (Signed)
Spoke with patient and she reports exposure to COVID but did have mask on but was not aware until later so high touch areas were not cleaned. Recommended that if she should develop similar symptoms then she should call her PCP office in the event she should need testing. She verbalized understanding with no further questions at this time.

## 2019-03-08 NOTE — Progress Notes (Signed)
EPIC Encounter for ICM Monitoring  Patient Name: Suzanne Spears is a 59 y.o. female Date: 03/08/2019 Primary Care Physican: [name], MD Primary Cardiologist: Rockey Situ Electrophysiologist: Vergie Living Pacing: 98.5% 03/08/2019 Weight: 154lbs   Heart failure questions reviewed. Sheisdoing well and denies fluid symptoms.   OptiVolThoracic impedance normal.  Prescribed:Furosemide 20 mgtake2tablets(40 mg total)every other day alternating with 1 tablet (20 mg total) every other day.Potassium 10 mEq 1 tablet daily  Labs: 10/28/2018 Creatinine1.23, BUN28, Potassium3.8, Sodium143, [id number] A complete set of results can be found in Results Review.  Recommendations: No changes and encouraged to call if experiencing any fluid symptoms..  Follow-up plan: ICM clinic phone appointment on 04/11/2019.   91 day device clinic remote transmission 06/03/2019.     Copy of ICM check sent to Dr. Caryl Comes.   3 month ICM trend: 03/04/2019    1 Year ICM trend:       [name], RN 03/08/2019 10:21 AM

## 2019-03-11 ENCOUNTER — Telehealth: Payer: Self-pay | Admitting: Internal Medicine

## 2019-03-11 ENCOUNTER — Ambulatory Visit (INDEPENDENT_AMBULATORY_CARE_PROVIDER_SITE_OTHER): Payer: BC Managed Care – PPO | Admitting: Internal Medicine

## 2019-03-11 ENCOUNTER — Encounter: Payer: Self-pay | Admitting: Internal Medicine

## 2019-03-11 ENCOUNTER — Other Ambulatory Visit: Payer: Self-pay

## 2019-03-11 DIAGNOSIS — U071 COVID-19: Secondary | ICD-10-CM | POA: Diagnosis not present

## 2019-03-11 DIAGNOSIS — Z8616 Personal history of COVID-19: Secondary | ICD-10-CM

## 2019-03-11 HISTORY — DX: Personal history of COVID-19: Z86.16

## 2019-03-11 MED ORDER — BENZONATATE 200 MG PO CAPS: 200.0000 mg | ORAL_CAPSULE | Freq: Three times a day (TID) | ORAL | 1 refills | Status: DC | PRN
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
## 2019-03-11 NOTE — Assessment & Plan Note (Addendum)
Symptoms started on Monday with pharyngitis, headache, sinus congestion chest tightness and fatigue .  Nonproductive cough started today.  02 sats at home 98% .  Sending in cough suppressant .  Referral made to infusion center . Advised to isolate for 14 days from Monday , reviewed criteria for breaking quarantine

## 2019-03-11 NOTE — Telephone Encounter (Signed)
Called infusion center for monoclonial infusion referral.

## 2019-03-12 ENCOUNTER — Encounter: Payer: Self-pay | Admitting: Physician Assistant

## 2019-03-12 ENCOUNTER — Other Ambulatory Visit: Payer: Self-pay | Admitting: Physician Assistant

## 2019-03-12 ENCOUNTER — Telehealth: Payer: Self-pay | Admitting: Physician Assistant

## 2019-03-12 DIAGNOSIS — U071 COVID-19: Secondary | ICD-10-CM

## 2019-03-12 DIAGNOSIS — I420 Dilated cardiomyopathy: Secondary | ICD-10-CM

## 2019-03-12 DIAGNOSIS — I10 Essential (primary) hypertension: Secondary | ICD-10-CM

## 2019-03-12 NOTE — Telephone Encounter (Signed)
  I connected by phone with Suzanne Spears on 03/12/2019 at 11:54 AM to discuss the potential use of an new treatment for mild to moderate COVID-19 viral infection in non-hospitalized patients.  This patient is a 59 y.o. female that meets the FDA criteria for Emergency Use Authorization of bamlanivimab or casirivimab\imdevimab.  Has a (+) direct SARS-CoV-2 viral test result  Has mild or moderate COVID-19   Is ? 59 years of age and weighs ? 40 kg  Is NOT hospitalized due to COVID-19  Is NOT requiring oxygen therapy or requiring an increase in baseline oxygen flow rate due to COVID-19  Is within 10 days of symptom onset  Has at least one of the high risk factor(s) for progression to severe COVID-19 and/or hospitalization as defined in EUA.  Specific high risk criteria : Cardiovascular Disease with ischemic cardiomyopathy and HTN.    I have spoken and communicated the following to the patient or parent/caregiver:  1. FDA has authorized the emergency use of bamlanivimab and casirivimab\imdevimab for the treatment of mild to moderate COVID-19 in adults and pediatric patients with positive results of direct SARS-CoV-2 viral testing who are 13 years of age and older weighing at least 40 kg, and who are at high risk for progressing to severe COVID-19 and/or hospitalization.  2. The significant known and potential risks and benefits of bamlanivimab and casirivimab\imdevimab, and the extent to which such potential risks and benefits are unknown.  3. Information on available alternative treatments and the risks and benefits of those alternatives, including clinical trials.  4. Patients treated with bamlanivimab and casirivimab\imdevimab should continue to self-isolate and use infection control measures (e.g., wear mask, isolate, social distance, avoid sharing personal items, clean and disinfect "high touch" surfaces, and frequent handwashing) according to CDC guidelines.   5. The patient or  parent/caregiver has the option to accept or refuse bamlanivimab or casirivimab\imdevimab .  After reviewing this information with the patient, The patient agreed to proceed with receiving the bamlanimivab infusion and will be provided a copy of the Fact sheet prior to receiving the infusion.   Pt is scheduled for 03/14/19 @ 14:30. She was given directions to the [facility] infusion center. She does want Dr. Rockey Situ, her cardiologist, to be aware and agree with the infusion. I told her I would CC him on this note.   Angelena Form 03/12/2019 11:54 AM  CC: Dr. Derrel Nip Dr. Rockey Situ

## 2019-03-14 ENCOUNTER — Ambulatory Visit (HOSPITAL_COMMUNITY)
Admission: RE | Admit: 2019-03-14 | Discharge: 2019-03-14 | Disposition: A | Discharge status: Home / Self Care | Payer: BC Managed Care – PPO | Source: Ambulatory Visit | Attending: Pulmonary Disease | Admitting: Pulmonary Disease

## 2019-03-14 DIAGNOSIS — U071 COVID-19: Secondary | ICD-10-CM

## 2019-03-14 DIAGNOSIS — I420 Dilated cardiomyopathy: Secondary | ICD-10-CM | POA: Insufficient documentation

## 2019-03-14 DIAGNOSIS — I10 Essential (primary) hypertension: Secondary | ICD-10-CM

## 2019-03-14 MED ORDER — ALBUTEROL SULFATE HFA 108 (90 BASE) MCG/ACT IN AERS: 2.0000 | INHALATION_SPRAY | Freq: Once | RESPIRATORY_TRACT | Status: DC | PRN

## 2019-03-14 MED ORDER — METHYLPREDNISOLONE SODIUM SUCC 125 MG IJ SOLR: 125.0000 mg | Freq: Once | INTRAMUSCULAR | Status: DC | PRN

## 2019-03-14 MED ORDER — SODIUM CHLORIDE 0.9 % IV SOLN
700.0000 mg | Freq: Once | INTRAVENOUS | Status: AC
  Administered 2019-03-14: 700 mg via INTRAVENOUS
  Filled 2019-03-14: qty 20

## 2019-03-14 MED ORDER — SODIUM CHLORIDE 0.9 % IV SOLN
INTRAVENOUS | Status: DC | PRN
  Administered 2019-03-14: 250 mL via INTRAVENOUS

## 2019-03-14 MED ORDER — DIPHENHYDRAMINE HCL 50 MG/ML IJ SOLN: 50.0000 mg | Freq: Once | INTRAMUSCULAR | Status: DC | PRN

## 2019-03-14 MED ORDER — FAMOTIDINE IN NACL 20-0.9 MG/50ML-% IV SOLN: 20.0000 mg | Freq: Once | INTRAVENOUS | Status: DC | PRN

## 2019-03-14 MED ORDER — ACETAMINOPHEN 325 MG PO TABS
650.0000 mg | ORAL_TABLET | Freq: Four times a day (QID) | ORAL | Status: DC | PRN
  Administered 2019-03-14: 650 mg via ORAL

## 2019-03-14 MED ORDER — ACETAMINOPHEN 325 MG PO TABS
ORAL_TABLET | ORAL | Status: AC
  Filled 2019-03-14: qty 2

## 2019-03-14 MED ORDER — EPINEPHRINE 0.3 MG/0.3ML IJ SOAJ: 0.3000 mg | Freq: Once | INTRAMUSCULAR | Status: DC | PRN

## 2019-03-14 NOTE — Progress Notes (Signed)
  Diagnosis: COVID-19  Physician: Dr. Joya Gaskins  Procedure: [facility] Infusion Clinic Med: bamlanivimab infusion - Provided patient with bamlanimivab fact sheet for patients, parents and caregivers prior to infusion.  Complications: No immediate complications noted.  Discharge: Discharged home   [place] 03/14/2019

## 2019-03-14 NOTE — Discharge Instructions (Signed)
10 Things You Can Do to Manage Your COVID-19 Symptoms at Home If you have possible or confirmed COVID-19: 1. Stay home from work and school. And stay away from other public places. If you must go out, avoid using any kind of public transportation, ridesharing, or taxis. 2. Monitor your symptoms carefully. If your symptoms get worse, call your healthcare provider immediately. 3. Get rest and stay hydrated. 4. If you have a medical appointment, call the healthcare provider ahead of time and tell them that you have or may have COVID-19. 5. For medical emergencies, call 911 and notify the dispatch personnel that you have or may have COVID-19. 6. Cover your cough and sneezes with a tissue or use the inside of your elbow. 7. Wash your hands often with soap and water for at least 20 seconds or clean your hands with an alcohol-based hand sanitizer that contains at least 60% alcohol. 8. As much as possible, stay in a specific room and away from other people in your home. Also, you should use a separate bathroom, if available. If you need to be around other people in or outside of the home, wear a mask. 9. Avoid sharing personal items with other people in your household, like dishes, towels, and bedding. 10. Clean all surfaces that are touched often, like counters, tabletops, and doorknobs. Use household cleaning sprays or wipes according to the label instructions. [email or web address] 08/25/2018 This information is not intended to replace advice given to you by your health care provider. Make sure you discuss any questions you have with your health care provider. Document Revised: 01/27/2019 Document Reviewed: 01/27/2019 Elsevier Patient Education  2020 [place].   COVID-19 COVID-19 is a respiratory infection that is caused by a virus called severe acute respiratory syndrome coronavirus 2 (SARS-CoV-2). The disease is also known as coronavirus disease or novel coronavirus. In some people, the virus  may not cause any symptoms. In others, it may cause a serious infection. The infection can get worse quickly and can lead to complications, such as:  Pneumonia, or infection of the lungs.  Acute respiratory distress syndrome or ARDS. This is a condition in which fluid build-up in the lungs prevents the lungs from filling with air and passing oxygen into the blood.  Acute respiratory failure. This is a condition in which there is not enough oxygen passing from the lungs to the body or when carbon dioxide is not passing from the lungs out of the body.  Sepsis or septic shock. This is a serious bodily reaction to an infection.  Blood clotting problems.  Secondary infections due to bacteria or fungus.  Organ failure. This is when your body's organs stop working. The virus that causes COVID-19 is contagious. This means that it can spread from person to person through droplets from coughs and sneezes (respiratory secretions). What are the causes? This illness is caused by a virus. You may catch the virus by:  Breathing in droplets from an infected person. Droplets can be spread by a person breathing, speaking, singing, coughing, or sneezing.  Touching something, like a table or a doorknob, that was exposed to the virus (contaminated) and then touching your mouth, nose, or eyes. What increases the risk? Risk for infection You are more likely to be infected with this virus if you:  Are within 6 feet (2 meters) of a person with COVID-19.  Provide care for or live with a person who is infected with COVID-19.  Spend time in crowded indoor spaces or  live in shared housing. Risk for serious illness You are more likely to become seriously ill from the virus if you:  Are 58 years of age or older. The higher your age, the more you are at risk for serious illness.  Live in a nursing home or long-term care facility.  Have cancer.  Have a long-term (chronic) disease such as: ? Chronic lung  disease, including chronic obstructive pulmonary disease or asthma. ? A long-term disease that lowers your body's ability to fight infection (immunocompromised). ? Heart disease, including heart failure, a condition in which the arteries that lead to the heart become narrow or blocked (coronary artery disease), a disease which makes the heart muscle thick, weak, or stiff (cardiomyopathy). ? Diabetes. ? Chronic kidney disease. ? Sickle cell disease, a condition in which red blood cells have an abnormal "sickle" shape. ? Liver disease.  Are obese. What are the signs or symptoms? Symptoms of this condition can range from mild to severe. Symptoms may appear any time from 2 to 14 days after being exposed to the virus. They include:  A fever or chills.  A cough.  Difficulty breathing.  Headaches, body aches, or muscle aches.  Runny or stuffy (congested) nose.  A sore throat.  New loss of taste or smell. Some people may also have stomach problems, such as nausea, vomiting, or diarrhea. Other people may not have any symptoms of COVID-19. How is this diagnosed? This condition may be diagnosed based on:  Your signs and symptoms, especially if: ? You live in an area with a COVID-19 outbreak. ? You recently traveled to or from an area where the virus is common. ? You provide care for or live with a person who was diagnosed with COVID-19. ? You were exposed to a person who was diagnosed with COVID-19.  A physical exam.  Lab tests, which may include: ? Taking a sample of fluid from the back of your nose and throat (nasopharyngeal fluid), your nose, or your throat using a swab. ? A sample of mucus from your lungs (sputum). ? Blood tests.  Imaging tests, which may include, X-rays, CT scan, or ultrasound. How is this treated? At present, there is no medicine to treat COVID-19. Medicines that treat other diseases are being used on a trial basis to see if they are effective against  COVID-19. Your health care provider will talk with you about ways to treat your symptoms. For most people, the infection is mild and can be managed at home with rest, fluids, and over-the-counter medicines. Treatment for a serious infection usually takes places in a hospital intensive care unit (ICU). It may include one or more of the following treatments. These treatments are given until your symptoms improve.  Receiving fluids and medicines through an IV.  Supplemental oxygen. Extra oxygen is given through a tube in the nose, a face mask, or a hood.  Positioning you to lie on your stomach (prone position). This makes it easier for oxygen to get into the lungs.  Continuous positive airway pressure (CPAP) or bi-level positive airway pressure (BPAP) machine. This treatment uses mild air pressure to keep the airways open. A tube that is connected to a motor delivers oxygen to the body.  Ventilator. This treatment moves air into and out of the lungs by using a tube that is placed in your windpipe.  Tracheostomy. This is a procedure to create a hole in the neck so that a breathing tube can be inserted.  Extracorporeal membrane  oxygenation (ECMO). This procedure gives the lungs a chance to recover by taking over the functions of the heart and lungs. It supplies oxygen to the body and removes carbon dioxide. Follow these instructions at home: Lifestyle  If you are sick, stay home except to get medical care. Your health care provider will tell you how long to stay home. Call your health care provider before you go for medical care.  Rest at home as told by your health care provider.  Do not use any products that contain nicotine or tobacco, such as cigarettes, e-cigarettes, and chewing tobacco. If you need help quitting, ask your health care provider.  Return to your normal activities as told by your health care provider. Ask your health care provider what activities are safe for you. General  instructions  Take over-the-counter and prescription medicines only as told by your health care provider.  Drink enough fluid to keep your urine pale yellow.  Keep all follow-up visits as told by your health care provider. This is important. How is this prevented?  There is no vaccine to help prevent COVID-19 infection. However, there are steps you can take to protect yourself and others from this virus. To protect yourself:   Do not travel to areas where COVID-19 is a risk. The areas where COVID-19 is reported change often. To identify high-risk areas and travel restrictions, check the CDC travel website: wwwnc.cdc.gov/travel/notices  If you live in, or must travel to, an area where COVID-19 is a risk, take precautions to avoid infection. ? Stay away from people who are sick. ? Wash your hands often with soap and water for 20 seconds. If soap and water are not available, use an alcohol-based hand sanitizer. ? Avoid touching your mouth, face, eyes, or nose. ? Avoid going out in public, follow guidance from your state and local health authorities. ? If you must go out in public, wear a cloth face covering or face mask. Make sure your mask covers your nose and mouth. ? Avoid crowded indoor spaces. Stay at least 6 feet (2 meters) away from others. ? Disinfect objects and surfaces that are frequently touched every day. This may include:  Counters and tables.  Doorknobs and light switches.  Sinks and faucets.  Electronics, such as phones, remote controls, keyboards, computers, and tablets. To protect others: If you have symptoms of COVID-19, take steps to prevent the virus from spreading to others.  If you think you have a COVID-19 infection, contact your health care provider right away. Tell your health care team that you think you may have a COVID-19 infection.  Stay home. Leave your house only to seek medical care. Do not use public transport.  Do not travel while you are  sick.  Wash your hands often with soap and water for 20 seconds. If soap and water are not available, use alcohol-based hand sanitizer.  Stay away from other members of your household. Let healthy household members care for children and pets, if possible. If you have to care for children or pets, wash your hands often and wear a mask. If possible, stay in your own room, separate from others. Use a different bathroom.  Make sure that all people in your household wash their hands well and often.  Cough or sneeze into a tissue or your sleeve or elbow. Do not cough or sneeze into your hand or into the air.  Wear a cloth face covering or face mask. Make sure your mask covers your nose   and mouth. Where to find more information  Centers for Disease Control and Prevention: www.cdc.gov/coronavirus/2019-ncov/index.html  World Health Organization: www.who.int/health-topics/coronavirus Contact a health care provider if:  You live in or have traveled to an area where COVID-19 is a risk and you have symptoms of the infection.  You have had contact with someone who has COVID-19 and you have symptoms of the infection. Get help right away if:  You have trouble breathing.  You have pain or pressure in your chest.  You have confusion.  You have bluish lips and fingernails.  You have difficulty waking from sleep.  You have symptoms that get worse. These symptoms may represent a serious problem that is an emergency. Do not wait to see if the symptoms will go away. Get medical help right away. Call your local emergency services (911 in the [place] Do not drive yourself to the hospital. Let the emergency medical personnel know if you think you have COVID-19. Summary  COVID-19 is a respiratory infection that is caused by a virus. It is also known as coronavirus disease or novel coronavirus. It can cause serious infections, such as pneumonia, acute respiratory distress syndrome, acute respiratory failure,  or sepsis.  The virus that causes COVID-19 is contagious. This means that it can spread from person to person through droplets from breathing, speaking, singing, coughing, or sneezing.  You are more likely to develop a serious illness if you are 50 years of age or older, have a weak immune system, live in a nursing home, or have chronic disease.  There is no medicine to treat COVID-19. Your health care provider will talk with you about ways to treat your symptoms.  Take steps to protect yourself and others from infection. Wash your hands often and disinfect objects and surfaces that are frequently touched every day. Stay away from people who are sick and wear a mask if you are sick. This information is not intended to replace advice given to you by your health care provider. Make sure you discuss any questions you have with your health care provider. Document Revised: 12/10/2018 Document Reviewed: 03/18/2018 Elsevier Patient Education  2020 [employer] 10 Things You Can Do to Manage Your COVID-19 Symptoms at Home If you have possible or confirmed COVID-19: 11. Stay home from work and school. And stay away from other public places. If you must go out, avoid using any kind of public transportation, ridesharing, or taxis. 12. Monitor your symptoms carefully. If your symptoms get worse, call your healthcare provider immediately. 13. Get rest and stay hydrated. 14. If you have a medical appointment, call the healthcare provider ahead of time and tell them that you have or may have COVID-19. 15. For medical emergencies, call 911 and notify the dispatch personnel that you have or may have COVID-19. 16. Cover your cough and sneezes with a tissue or use the inside of your elbow. 17. Wash your hands often with soap and water for at least 20 seconds or clean your hands with an alcohol-based hand sanitizer that contains at least 60% alcohol. 18. As much as possible, stay in a specific room and away from  other people in your home. Also, you should use a separate bathroom, if available. If you need to be around other people in or outside of the home, wear a mask. 19. Avoid sharing personal items with other people in your household, like dishes, towels, and bedding. 20. Clean all surfaces that are touched often, like counters, tabletops, and doorknobs. Use   household cleaning sprays or wipes according to the label instructions. [email or web address] 08/25/2018 This information is not intended to replace advice given to you by your health care provider. Make sure you discuss any questions you have with your health care provider. Document Revised: 01/27/2019 Document Reviewed: 01/27/2019 Elsevier Patient Education  [employer]. What types of side effects do monoclonal antibody drugs cause?  Common side effects  In general, the more common side effects caused by monoclonal antibody drugs include: . Allergic reactions, such as hives or itching . Flu-like signs and symptoms, including chills, fatigue, fever, and muscle aches and pains . Nausea, vomiting . Diarrhea . Skin rashes . Low blood pressure   The CDC is recommending patients who receive monoclonal antibody treatments wait at least 90 days before being vaccinated.  Currently, there are no data on the safety and efficacy of mRNA COVID-19 vaccines in persons who received monoclonal antibodies or convalescent plasma as part of COVID-19 treatment. Based on the estimated half-life of such therapies as well as evidence suggesting that reinfection is uncommon in the 90 days after initial infection, vaccination should be deferred for at least 90 days, as a precautionary measure until additional information becomes available, to avoid interference of the antibody treatment with vaccine-induced immune responses.  What types of side effects do monoclonal antibody drugs cause?  Common side effects  In general, the more common side effects caused  by monoclonal antibody drugs include: . Allergic reactions, such as hives or itching . Flu-like signs and symptoms, including chills, fatigue, fever, and muscle aches and pains . Nausea, vomiting . Diarrhea . Skin rashes . Low blood pressure   The CDC is recommending patients who receive monoclonal antibody treatments wait at least 90 days before being vaccinated.  Currently, there are no data on the safety and efficacy of mRNA COVID-19 vaccines in persons who received monoclonal antibodies or convalescent plasma as part of COVID-19 treatment. Based on the estimated half-life of such therapies as well as evidence suggesting that reinfection is uncommon in the 90 days after initial infection, vaccination should be deferred for at least 90 days, as a precautionary measure until additional information becomes available, to avoid interference of the antibody treatment with vaccine-induced immune responses.

## 2019-03-28 ENCOUNTER — Other Ambulatory Visit: Payer: Self-pay | Admitting: Cardiovascular Disease

## 2019-03-28 ENCOUNTER — Other Ambulatory Visit: Payer: Self-pay | Admitting: Internal Medicine

## 2019-04-05 ENCOUNTER — Encounter: Payer: BC Managed Care – PPO | Admitting: Internal Medicine

## 2019-04-11 ENCOUNTER — Telehealth: Payer: Self-pay

## 2019-04-11 ENCOUNTER — Ambulatory Visit (INDEPENDENT_AMBULATORY_CARE_PROVIDER_SITE_OTHER): Payer: BC Managed Care – PPO

## 2019-04-11 DIAGNOSIS — Z95810 Presence of automatic (implantable) cardiac defibrillator: Secondary | ICD-10-CM

## 2019-04-11 DIAGNOSIS — I5022 Chronic systolic (congestive) heart failure: Secondary | ICD-10-CM | POA: Diagnosis not present

## 2019-04-11 NOTE — Progress Notes (Signed)
EPIC Encounter for ICM Monitoring  Patient Name: Suzanne Spears is a 59 y.o. female Date: 04/11/2019 Primary Care Physican: [name], MD Primary Cardiologist: Rockey Situ Electrophysiologist: Vergie Living Pacing: 98.5% 03/08/2019 Weight: 154lbs     Heart failure questions reviewed. She reports she and her family are just getting over [place].  She stopped taking Lasix around mid January and resumed 2/14.         Fluid intake increased during the time she had COVID which may contribute to the possible fluid accumulation along with stopping Lasix.   OptiVolThoracic impedance suggesting possible ongoing fluid accumulation since 03/07/2019.  Impedance suggesting possible fluid accumulation episodes have been more frequent and longer in duration since 12/2018.  Prescribed:Furosemide 20 mgtake2tablets(40 mg total)every other day alternating with 1 tablet (20 mg total) every other day.Potassium 10 mEq 1 tablet daily  Labs: 10/28/2018 Creatinine1.23, BUN28, Potassium3.8, Sodium143, [id number] A complete set of results can be found in Results Review.  Recommendations:  Pt will self adjust Lasix for the next 2 days.    Follow-up plan: ICM clinic phone appointment on 04/18/2019 [facility] send) to recheck fluid levels.   91 day device clinic remote transmission 06/03/2019.     Copy of ICM check sent to Dr. Caryl Comes.   3 month ICM trend: 04/11/2019    1 Year ICM trend:       [name], RN 04/11/2019 3:06 PM

## 2019-04-11 NOTE — Telephone Encounter (Signed)
Remote ICM transmission received.  Attempted call to patient regarding ICM remote transmission and left detailed message per DPR to return call.  Advised to return call for any fluid symptoms or questions.      

## 2019-04-18 ENCOUNTER — Telehealth: Payer: Self-pay

## 2019-04-18 NOTE — Telephone Encounter (Signed)
Received call from patient stating she called [employer] because her monitor is not working.  She is unable to send a remote transmission today to recheck fluid levels.  [employer] will send her a new monitor within 7-10 days.  She reports weight decreased to 152.8 lbs today after taking extra Torsemide.  She is back on prescribed dosage and will continue to monitor and report if any fluid symptoms develop.  ICM remote transmission rescheduled for 3/5.

## 2019-04-26 ENCOUNTER — Other Ambulatory Visit: Payer: Self-pay

## 2019-04-26 ENCOUNTER — Telehealth: Payer: Self-pay | Admitting: Internal Medicine

## 2019-04-26 NOTE — Telephone Encounter (Signed)
Pt states that she feels "off". She feels as if she is not getting enough air, fatigued, no fever, muscle aches, chest doesn't hurt but feels heavy. She has a headache but states it doesn't feel like the one she had with covid. She repeated multiple times that she just does not feel right. Pt used her inhaler yesterday and it helped some. She is concerned and wants to know what do you recommend she do. Please advise.

## 2019-04-26 NOTE — Telephone Encounter (Signed)
Patient had cpe scheduled for tomorrow with Dr. Derrel Nip. This appointment has been changed to a virtual/discuss covid symptoms returning after COVID. Patient was tested the end of January at [place]. In the past few days patient has developed cough/fatigue and Chest feels heavy. Patient is concerned because of other health issues.

## 2019-04-26 NOTE — Telephone Encounter (Signed)
Spoke with pt and she stated that is very fatigued, has started coughing, drainage and her chest feels heavy. Symptoms started Saturday and has gotten worse since then. Pt stated that her O2 is staying between 95-99%, pulse has been between 88-95, BP last night was 117/80. Pt stated that yesterday she had some difficulty breathing, she used her inhaler and it seemed to have gotten better. Pt was advised to go to the ED or urgent care. Pt stated that she didn't feel like she needed to go to the ED right now but that he will not hesitate to go with she gets to feeling worse. Pt stated that she has left a message for Dr. Mortimer Fries, pulmonologist to call her back. Pt is also scheduled for a virtual visit with you tomorrow.

## 2019-04-26 NOTE — Telephone Encounter (Signed)
Our first available appt with a NP is 3/17. Pt did not want to wait that long. I offered her an appt in the [place] and she did not want to drive that far. She stated that she has a virtual visit with her PCP tomorrow and will go from there otherwise go to urgent care. Nothing further needed.

## 2019-04-26 NOTE — Telephone Encounter (Signed)
Pt is not feeling well, she's not sure whats going on. No fever. She had covid towards the end of January and she has a cough, slight headache Had to use her inhaler yesterday. Cough started on Saturday. She has sinus drainage and wants to know if this is side effects of covid or something else? Cb#: [phone]

## 2019-04-26 NOTE — Telephone Encounter (Signed)
Its very difficult to say, needs OV assessment

## 2019-04-27 ENCOUNTER — Encounter: Payer: Self-pay | Admitting: Internal Medicine

## 2019-04-27 ENCOUNTER — Telehealth (INDEPENDENT_AMBULATORY_CARE_PROVIDER_SITE_OTHER): Payer: BC Managed Care – PPO | Admitting: Internal Medicine

## 2019-04-27 ENCOUNTER — Other Ambulatory Visit: Payer: Self-pay | Admitting: Internal Medicine

## 2019-04-27 DIAGNOSIS — Z20822 Contact with and (suspected) exposure to covid-19: Secondary | ICD-10-CM | POA: Diagnosis not present

## 2019-04-27 DIAGNOSIS — I429 Cardiomyopathy, unspecified: Secondary | ICD-10-CM

## 2019-04-27 MED ORDER — CHERATUSSIN AC 100-10 MG/5ML PO SOLN: 5.0000 mL | Freq: Three times a day (TID) | ORAL | 0 refills | Status: DC | PRN

## 2019-04-27 MED ORDER — AZITHROMYCIN 500 MG PO TABS: 500.0000 mg | ORAL_TABLET | Freq: Every day | ORAL | 0 refills | Status: DC
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
## 2019-04-27 NOTE — Assessment & Plan Note (Signed)
Symptoms suspicious for recurrent infection. Advised to remain out of work until she can be tested.  Empiric azithromycin for alternative diagnosis of sinusitis given  Along with cough suppressant

## 2019-05-03 NOTE — Progress Notes (Signed)
No ICM remote transmission received for 04/29/2019 and next ICM transmission scheduled for 05/23/2019.

## 2019-05-04 ENCOUNTER — Telehealth: Payer: Self-pay | Admitting: Cardiovascular Disease

## 2019-05-04 NOTE — Telephone Encounter (Signed)
Patient had Covid in late January. Patient was also treated for a sinus infection last week (coughing, headache, runny nose/ a little blood in mucus) over telemedicine. Patient was still experiencing fatigue and wondering if this could be Covid related or separate.  Patient would like to speak with clinical and just talk this out. Patient tested negative for covid but just curious of covid side effects that linger  Please advise

## 2019-05-04 NOTE — Telephone Encounter (Signed)
Spoke with patient and she was inquiring about if theses symptoms could be lingering effects from her having COVID. She reports that she is currently testing negative. Reviewed that we have heard that some do have long lasting effects but that we are not yet fully aware about this. Recommended her to reach out to Dr. Derrel Nip regarding her concerns and if her symptoms persist. She verbalized understanding with no further questions at this time.

## 2019-05-20 ENCOUNTER — Other Ambulatory Visit: Payer: Self-pay | Admitting: Internal Medicine

## 2019-05-20 DIAGNOSIS — R921 Mammographic calcification found on diagnostic imaging of breast: Secondary | ICD-10-CM

## 2019-06-03 ENCOUNTER — Other Ambulatory Visit: Payer: Self-pay | Admitting: Internal Medicine

## 2019-06-03 NOTE — Progress Notes (Signed)
No ICM remote transmission received for 05/23/2019 and next ICM transmission scheduled for 06/21/2019.

## 2019-06-13 ENCOUNTER — Other Ambulatory Visit: Payer: Self-pay | Admitting: Ophthalmology

## 2019-06-13 DIAGNOSIS — H5711 Ocular pain, right eye: Secondary | ICD-10-CM

## 2019-06-16 ENCOUNTER — Ambulatory Visit
Admission: RE | Admit: 2019-06-16 | Discharge: 2019-06-16 | Disposition: A | Discharge status: Home / Self Care | Payer: BC Managed Care – PPO | Source: Ambulatory Visit | Attending: Ophthalmology | Admitting: Ophthalmology

## 2019-06-16 ENCOUNTER — Other Ambulatory Visit: Payer: Self-pay

## 2019-06-16 DIAGNOSIS — H5711 Ocular pain, right eye: Secondary | ICD-10-CM | POA: Diagnosis present

## 2019-06-20 ENCOUNTER — Ambulatory Visit
Admission: RE | Admit: 2019-06-20 | Discharge: 2019-06-20 | Disposition: A | Discharge status: Home / Self Care | Payer: BC Managed Care – PPO | Source: Ambulatory Visit | Attending: Internal Medicine | Admitting: Internal Medicine

## 2019-06-20 DIAGNOSIS — R921 Mammographic calcification found on diagnostic imaging of breast: Secondary | ICD-10-CM | POA: Diagnosis present

## 2019-06-21 ENCOUNTER — Encounter: Payer: Self-pay | Admitting: Internal Medicine

## 2019-06-21 ENCOUNTER — Ambulatory Visit (INDEPENDENT_AMBULATORY_CARE_PROVIDER_SITE_OTHER): Payer: BC Managed Care – PPO

## 2019-06-21 DIAGNOSIS — I5022 Chronic systolic (congestive) heart failure: Secondary | ICD-10-CM

## 2019-06-21 DIAGNOSIS — R921 Mammographic calcification found on diagnostic imaging of breast: Secondary | ICD-10-CM

## 2019-06-21 DIAGNOSIS — Z95810 Presence of automatic (implantable) cardiac defibrillator: Secondary | ICD-10-CM | POA: Diagnosis not present

## 2019-06-21 HISTORY — DX: Mammographic calcification found on diagnostic imaging of breast: R92.1

## 2019-06-22 ENCOUNTER — Ambulatory Visit (INDEPENDENT_AMBULATORY_CARE_PROVIDER_SITE_OTHER): Payer: BC Managed Care – PPO | Admitting: *Deleted

## 2019-06-22 DIAGNOSIS — I5032 Chronic diastolic (congestive) heart failure: Secondary | ICD-10-CM

## 2019-06-22 LAB — CUP PACEART REMOTE DEVICE CHECK
Battery Remaining Longevity: 21 mo
Battery Voltage: 2.91 V
Brady Statistic AP VP Percent: 0.03 %
Brady Statistic AP VS Percent: 0.01 %
Brady Statistic AS VP Percent: 98.58 %
Brady Statistic AS VS Percent: 1.37 %
Brady Statistic RA Percent Paced: 0.04 %
Brady Statistic RV Percent Paced: 2.39 %
Date Time Interrogation Session: 20210428111443
HighPow Impedance: 79 Ohm
Implantable Lead Implant Date: 20160421
Implantable Lead Implant Date: 20160421
Implantable Lead Implant Date: 20161006
Implantable Lead Implant Date: 20161006
Implantable Lead Location: 753858
Implantable Lead Location: 753858
Implantable Lead Location: 753859
Implantable Lead Location: 753860
Implantable Lead Model: 5071
Implantable Lead Model: 5071
Implantable Lead Model: 5076
Implantable Pulse Generator Implant Date: 20160421
Lead Channel Impedance Value: 323 Ohm
Lead Channel Impedance Value: 323 Ohm
Lead Channel Impedance Value: 380 Ohm
Lead Channel Impedance Value: 399 Ohm
Lead Channel Impedance Value: 4047 Ohm
Lead Channel Impedance Value: 4047 Ohm
Lead Channel Pacing Threshold Amplitude: 0.5 V
Lead Channel Pacing Threshold Amplitude: 1.625 V
Lead Channel Pacing Threshold Pulse Width: 0.4 ms
Lead Channel Pacing Threshold Pulse Width: 0.4 ms
Lead Channel Sensing Intrinsic Amplitude: 2.25 mV
Lead Channel Sensing Intrinsic Amplitude: 2.25 mV
Lead Channel Sensing Intrinsic Amplitude: 25 mV
Lead Channel Sensing Intrinsic Amplitude: 25 mV
Lead Channel Setting Pacing Amplitude: 1.5 V
Lead Channel Setting Pacing Amplitude: 2 V
Lead Channel Setting Pacing Amplitude: 2.5 V
Lead Channel Setting Pacing Pulse Width: 0.4 ms
Lead Channel Setting Pacing Pulse Width: 0.4 ms
Lead Channel Setting Sensing Sensitivity: 0.3 mV

## 2019-06-23 NOTE — Progress Notes (Signed)
ICD Remote  

## 2019-06-24 ENCOUNTER — Telehealth: Payer: Self-pay

## 2019-06-24 NOTE — Telephone Encounter (Signed)
Remote ICM transmission received.  Attempted call to patient regarding ICM remote transmission and left detailed message per DPR.  Advised to return call for any fluid symptoms or questions. Next ICM remote transmission scheduled 07/27/2019.     

## 2019-06-24 NOTE — Progress Notes (Signed)
EPIC Encounter for ICM Monitoring  Patient Name: Suzanne Spears is a 59 y.o. female Date: 06/24/2019 Primary Care Physican: [name], MD Primary Cardiologist: Rockey Situ Electrophysiologist: Vergie Living Pacing: 98.5% LastWeight: 154lbs    Attempted call to patient and unable to reach.  Left detailed message per DPR regarding transmission. Transmission reviewed.   OptiVolThoracic impedance normal on transmission date but suggesting fluid accumulation since 4/7 with exception of 2 days at baseline.  Prescribed:Furosemide 20 mgtake2tablets(40 mg total)every other day alternating with 1 tablet (20 mg total) every other day.Potassium 10 mEq 1 tablet daily  Labs: 10/28/2018 Creatinine1.23, BUN28, Potassium3.8, Sodium143, [id number] A complete set of results can be found in Results Review.  Recommendations:Left voice mail with ICM number and encouraged to call if experiencing any fluid symptoms.  Follow-up plan: ICM clinic phone appointment on6/03/2019. 91 day device clinic remote transmission 09/02/2019. Office visit scheduled with Dr Rockey Situ on 08/23/2019.  Copy of ICM check sent to [place].  3 month ICM trend: 06/22/2019    1 Year ICM trend:       [name], RN 06/24/2019 8:37 AM

## 2019-07-14 ENCOUNTER — Ambulatory Visit
Admission: EM | Admit: 2019-07-14 | Discharge: 2019-07-14 | Disposition: A | Discharge status: Home / Self Care | Payer: BC Managed Care – PPO

## 2019-07-14 ENCOUNTER — Other Ambulatory Visit: Payer: Self-pay

## 2019-07-14 DIAGNOSIS — W57XXXA Bitten or stung by nonvenomous insect and other nonvenomous arthropods, initial encounter: Secondary | ICD-10-CM

## 2019-07-14 DIAGNOSIS — R52 Pain, unspecified: Secondary | ICD-10-CM | POA: Diagnosis not present

## 2019-07-14 DIAGNOSIS — T8089XA Other complications following infusion, transfusion and therapeutic injection, initial encounter: Secondary | ICD-10-CM | POA: Diagnosis not present

## 2019-07-14 DIAGNOSIS — R519 Headache, unspecified: Secondary | ICD-10-CM

## 2019-07-14 MED ORDER — KETOROLAC TROMETHAMINE 10 MG PO TABS: 10.0000 mg | ORAL_TABLET | Freq: Three times a day (TID) | ORAL | 0 refills | Status: DC | PRN

## 2019-07-14 MED ORDER — DOXYCYCLINE HYCLATE 100 MG PO CAPS: 200.0000 mg | ORAL_CAPSULE | Freq: Once | ORAL | 0 refills | Status: AC

## 2019-07-14 NOTE — ED Triage Notes (Addendum)
Pulled tick off of leg this morning. C/o headache and dizziness. Pt has history of migraines. Had COVID vaccine on Tuesday. Has not taken anything at home for relief.

## 2019-07-14 NOTE — Discharge Instructions (Addendum)
It was very nice seeing you today in clinic. Thank you for entrusting me with your care.   Suspect majority of your symptoms are related to your recent SARS-CoV-2 (novel coronavirus) vaccine. The symptoms you are describing are common following that injection. I do feel like they gave the injection too high, which may cause you issues. Use the anti-inflammatory as prescribed. Apply heat/ice for comfort. Perform range of motions movements as discussed. Call Dr. Derrel Nip if not getting better.   Low risk for tick borne disease. CDC has changed treatment recommendations. Providing single dose of doxycycline.   Make arrangements to follow up with your regular doctor in 1 week for re-evaluation if not improving. If your symptoms/condition worsens, please seek follow up care either here or in the ER. Please remember, our [place] providers are "right here with you" when you need [name].   Again, it was my pleasure to take care of you today. Thank you for choosing our clinic. I hope that you start to feel better quickly.   Honor Loh, MSN, APRN, FNP-C, CEN Advanced Practice Provider [facility]
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
## 2019-07-21 ENCOUNTER — Other Ambulatory Visit: Payer: Self-pay | Admitting: Neurology

## 2019-07-21 ENCOUNTER — Other Ambulatory Visit (HOSPITAL_COMMUNITY): Payer: Self-pay | Admitting: Neurology

## 2019-07-21 DIAGNOSIS — R200 Anesthesia of skin: Secondary | ICD-10-CM

## 2019-07-26 ENCOUNTER — Other Ambulatory Visit: Payer: Self-pay | Admitting: Internal Medicine

## 2019-07-26 ENCOUNTER — Ambulatory Visit: Payer: BC Managed Care – PPO

## 2019-07-27 ENCOUNTER — Other Ambulatory Visit: Payer: Self-pay | Admitting: Internal Medicine

## 2019-07-27 ENCOUNTER — Ambulatory Visit (INDEPENDENT_AMBULATORY_CARE_PROVIDER_SITE_OTHER): Payer: BC Managed Care – PPO

## 2019-07-27 DIAGNOSIS — I5032 Chronic diastolic (congestive) heart failure: Secondary | ICD-10-CM

## 2019-07-27 DIAGNOSIS — Z95810 Presence of automatic (implantable) cardiac defibrillator: Secondary | ICD-10-CM | POA: Diagnosis not present

## 2019-07-27 NOTE — Telephone Encounter (Signed)
Called and spoke with patient about refill for inhaler. RX has been sent to preferred pharmacy. Nothing further needed at this time.

## 2019-07-29 ENCOUNTER — Other Ambulatory Visit: Payer: Self-pay | Admitting: Cardiovascular Disease

## 2019-07-29 DIAGNOSIS — I5032 Chronic diastolic (congestive) heart failure: Secondary | ICD-10-CM

## 2019-07-29 NOTE — Progress Notes (Addendum)
Returned patient call regarding Furosemide refill.   She stated she needs a refill for Furosemide.  Advised will send script to [facility] in [place].  She also called the pharmacy to request a refill.  Attempted to reorder but there is already a pending reorder for today.

## 2019-07-29 NOTE — Progress Notes (Signed)
EPIC Encounter for ICM Monitoring  Patient Name: Suzanne Spears is a 59 y.o. female Date: 07/29/2019 Primary Care Physican: [name], MD Primary Cardiologist: Rockey Situ Electrophysiologist: Vergie Living Pacing: 98.6% LastWeight: 154lbs     Spoke with patient.  She has been on vacation and eating foods high in salt and not taking Lasix since she was traveling. She said she can tell she has some fluid but did not give specific symptoms.  OptiVolThoracic impedance suggesting possible fluid accumulation since 07/15/2019.  Prescribed: Furosemide 20 mgtake2tablets(40 mg total)every other day alternating with 1 tablet (20 mg total) every other day. Potassium 10 mEq 1 tablet daily  Labs: 10/28/2018 Creatinine1.23, BUN28, Potassium3.8, Sodium143, [id number] A complete set of results can be found in Results Review.  Recommendations: Patient reports she will take extra Furosemide for a couple of days to help with fluid retention since she was not taking Furosemide during vacation week.  Follow-up plan: ICM clinic phone appointment on6/08/2019 (manual send) to recheck fluid levels. 91 day device clinic remote transmission 09/02/2019. Office visit scheduled with Dr Rockey Situ on 08/23/2019.  Copy of ICM check sent to [place].  3 month ICM trend: 07/27/2019    1 Year ICM trend:       Suzanne Billings, RN 07/29/2019 11:58 AM

## 2019-08-01 ENCOUNTER — Ambulatory Visit (INDEPENDENT_AMBULATORY_CARE_PROVIDER_SITE_OTHER): Payer: BC Managed Care – PPO

## 2019-08-01 DIAGNOSIS — I5032 Chronic diastolic (congestive) heart failure: Secondary | ICD-10-CM

## 2019-08-01 DIAGNOSIS — Z95810 Presence of automatic (implantable) cardiac defibrillator: Secondary | ICD-10-CM

## 2019-08-02 NOTE — Progress Notes (Signed)
[place] Encounter for ICM Monitoring  Patient Name: Suzanne Spears is a 59 y.o. female Date: 08/02/2019 Primary Care Physican: [name], MD Primary Cardiologist: Rockey Situ Electrophysiologist: Vergie Living Pacing: 99.4% 08/03/2019 Weight: 151.6lbs     Spoke with patient and she said she is doing well.  She lost 4 pounds after taking extra Furosmide.   OptiVolThoracic impedancereturned to normal after taking extra Furosemide.  Prescribed:  Furosemide 20 mgtake2tablets(40 mg total)every other day alternating with 1 tablet (20 mg total) every other day.  Potassium 10 mEq 1 tablet daily  Labs: 10/28/2018 Creatinine1.23, BUN28, Potassium3.8, Sodium143, [id number] A complete set of results can be found in Results Review.  Recommendations:Recommendation to limit salt intake to 2000 mg daily and fluid intake to 64 oz daily.  Encouraged to call if experiencing any fluid symptoms.   Follow-up plan: ICM clinic phone appointment on7/01/2020. 91 day device clinic remote transmission7/10/2019. Office visit scheduled with Dr Rockey Situ on 08/23/2019.  Copy of ICM check sent to [place].    3 month ICM trend: 08/01/2019    1 Year ICM trend:       [name], RN 08/02/2019 10:38 AM

## 2019-08-03 ENCOUNTER — Telehealth: Payer: Self-pay | Admitting: Cardiovascular Disease

## 2019-08-03 DIAGNOSIS — R000 Tachycardia, unspecified: Secondary | ICD-10-CM

## 2019-08-03 NOTE — Telephone Encounter (Signed)
Spoke with patient and she reports that while sitting at work she had an episode where her heart rate was up to 102 and was staying in that range consistently. She reports being dizzy & having some body shakes. Blood pressures were 120/87 & 119/80 which she states is elevated for her. She is concerned about this and is afraid it may cause her device to go off. She is going out of town this Saturday and returning the following week. Offered a sooner appointment but it was during that time. She then states that she will wait for her scheduled appointment with Dr. Rockey Situ on the 29th. She also mentioned that she had COVID back in January and a relapse of that as well. She did get her first Moderna vaccine and is now nervous about this because she is due for her second one. She would like to know if Dr. Rockey Situ recommends her second COVID shot and also wonders if all of this heart rate and BP event could be from COVID effects. Let her know that I would forward this to him for review and would be in touch with any recommendations.

## 2019-08-03 NOTE — Telephone Encounter (Signed)
Patient calling States that she had an episode today Heart was pounding and pulse ox stated HR was 104 and stayed high for a good while Patient feels like she has ran a marathon and was lightheaded Patient's BP states 120/87 with HR 91 Please call to discuss - patient has an appointment with Dr Rockey Situ on 6/29

## 2019-08-05 NOTE — Telephone Encounter (Signed)
Etiology of her symptoms is unclear If she would like to verify cardiac function remains normal prior to any further vaccine administration, We could order echocardiogram to make sure there has been no changes If cardiac function remains strong, stable as it was previously, then could have vaccine. That is one option.  If she does feel better in the next several days,  goes back to normal , then I would probably proceed with a second vaccine

## 2019-08-05 NOTE — Telephone Encounter (Signed)
Reviewed provider options with patient and she would like to have echocardiogram done and then see Dr. Rockey Situ. Advised that I would have scheduling give her a call to arrange the echo and follow up with provider. Discussed repeat vaccine and let her know that if no return or repeat symptoms again then she should consider getting that second dose. She verbalized understanding of our conversation, agreement with plan, and had no further questions at this time. She did mention that she is out of town next week for vacation and leave message if needed for scheduling. Will cancel follow up for the 29th with Dr. Rockey Situ so they can reschedule after echocardiogram.

## 2019-08-08 NOTE — Telephone Encounter (Signed)
Scheduled on 6/24 and fu on 6/29

## 2019-08-09 ENCOUNTER — Ambulatory Visit: Payer: BC Managed Care – PPO | Admitting: Cardiovascular Disease

## 2019-08-10 ENCOUNTER — Ambulatory Visit: Payer: BC Managed Care – PPO | Admitting: Cardiovascular Disease

## 2019-08-18 ENCOUNTER — Ambulatory Visit (INDEPENDENT_AMBULATORY_CARE_PROVIDER_SITE_OTHER): Payer: BC Managed Care – PPO

## 2019-08-18 ENCOUNTER — Other Ambulatory Visit: Payer: Self-pay | Admitting: Internal Medicine

## 2019-08-18 ENCOUNTER — Other Ambulatory Visit: Payer: Self-pay

## 2019-08-18 DIAGNOSIS — R000 Tachycardia, unspecified: Secondary | ICD-10-CM | POA: Diagnosis not present
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
## 2019-08-22 ENCOUNTER — Ambulatory Visit
Admission: RE | Admit: 2019-08-22 | Discharge: 2019-08-22 | Disposition: A | Discharge status: Home / Self Care | Payer: BC Managed Care – PPO | Source: Ambulatory Visit | Attending: Neurology | Admitting: Neurology

## 2019-08-22 ENCOUNTER — Other Ambulatory Visit: Payer: Self-pay

## 2019-08-22 DIAGNOSIS — R200 Anesthesia of skin: Secondary | ICD-10-CM | POA: Diagnosis present

## 2019-08-23 ENCOUNTER — Encounter: Payer: Self-pay | Admitting: Cardiovascular Disease

## 2019-08-23 ENCOUNTER — Telehealth: Payer: Self-pay | Admitting: Internal Medicine

## 2019-08-23 ENCOUNTER — Other Ambulatory Visit: Payer: Self-pay | Admitting: Cardiovascular Disease

## 2019-08-23 ENCOUNTER — Ambulatory Visit (INDEPENDENT_AMBULATORY_CARE_PROVIDER_SITE_OTHER): Payer: BC Managed Care – PPO | Admitting: Cardiovascular Disease

## 2019-08-23 VITALS — BP 106/78 | HR 79 | Ht 65.5 in | Wt 152.5 lb

## 2019-08-23 DIAGNOSIS — I10 Essential (primary) hypertension: Secondary | ICD-10-CM | POA: Diagnosis not present

## 2019-08-23 DIAGNOSIS — I420 Dilated cardiomyopathy: Secondary | ICD-10-CM | POA: Diagnosis not present

## 2019-08-23 DIAGNOSIS — Z95810 Presence of automatic (implantable) cardiac defibrillator: Secondary | ICD-10-CM | POA: Diagnosis not present

## 2019-08-23 DIAGNOSIS — I5032 Chronic diastolic (congestive) heart failure: Secondary | ICD-10-CM

## 2019-08-23 MED ORDER — ENTRESTO 24-26 MG PO TABS: 1.0000 | ORAL_TABLET | Freq: Two times a day (BID) | ORAL | 3 refills | Status: DC

## 2019-08-23 MED ORDER — POTASSIUM CHLORIDE ER 10 MEQ PO TBCR: 10.0000 meq | EXTENDED_RELEASE_TABLET | Freq: Every day | ORAL | 3 refills | Status: DC

## 2019-08-23 NOTE — Telephone Encounter (Signed)
Transmission received.

## 2019-08-23 NOTE — Telephone Encounter (Signed)
I called the pt and she states she will do the transmission in 45 minutes. I told her once I see it I will let the nurse know and someone will give her a call back.

## 2019-08-23 NOTE — Telephone Encounter (Signed)
Patient concerned about EF dropping on last Echo.  She states Dr. Rockey Situ is adjusting meds and she wants to discuss with Dr. Caryl Comes at a visit .    Reports tach / htn / some sob   Scheduled 8/31 klein added to waitlist.

## 2019-08-23 NOTE — Telephone Encounter (Signed)
Transmission reviewed. Presenting EGM shows AS/ VP at 88 bpm, no episodes since last transmission on 08/01/19 , VP % 98.6% and Optivol below baseline.

## 2019-08-23 NOTE — Patient Instructions (Addendum)
Medication Instructions: In 2 weeks Your physician has recommended you make the following change in your medication:  1. STOP Losartan 2. START Entresto 24/26 mg twice a day (May start with 1/2 tablet twice a day and then increase to whole tablet)  Monitor blood pressures. Blood pressures may be low in the beginning. If they persist 90/60 or lower for more than a few days please give [name] a call.    If you need a refill on your cardiac medications before your next appointment, please call your pharmacy.    Lab work: No new labs needed   If you have labs (blood work) drawn today and your tests are completely normal, you will receive your results only by:  [facility] (if you have MyChart) OR  A paper copy in the mail If you have any lab test that is abnormal or we need to change your treatment, we will call you to review the results.   Testing/Procedures: No new testing needed   Follow-Up: At [facility], you and your health needs are our priority.  As part of our continuing mission to provide you with exceptional heart care, we have created designated Provider Care Teams.  These Care Teams include your primary Cardiologist (physician) and Advanced Practice Providers (APPs -  Physician Assistants and Nurse Practitioners) who all work together to provide you with the care you need, when you need it.   You will need a follow up appointment in 3 months    Providers on your designated Care Team:    Murray Hodgkins, NP  Christell Faith, PA-C  Marrianne Mood, PA-C  Any Other Special Instructions Will Be Listed Below (If Applicable).

## 2019-08-23 NOTE — Telephone Encounter (Signed)
-----   Message from Valora Corporal, RN sent at 08/23/2019  9:10 AM EDT ----- Dr. Rockey Situ wanted to see if a download could be done for her device. Patient had episode of fast heart rate and he wanted to see if it was Suzanne Spears. Thanks

## 2019-08-23 NOTE — Telephone Encounter (Signed)
It is reassuring there is no significant arrhythmia Would relay results to her and let her know if she has recurrent episodes that she call our office

## 2019-08-24 NOTE — Telephone Encounter (Signed)
Spoke with patient and reviewed provider information on download of device and she verbalized understanding with no further questions at this time.

## 2019-09-02 ENCOUNTER — Ambulatory Visit (INDEPENDENT_AMBULATORY_CARE_PROVIDER_SITE_OTHER): Payer: BC Managed Care – PPO | Admitting: *Deleted

## 2019-09-02 DIAGNOSIS — I420 Dilated cardiomyopathy: Secondary | ICD-10-CM

## 2019-09-02 LAB — CUP PACEART REMOTE DEVICE CHECK
Battery Remaining Longevity: 19 mo
Battery Voltage: 2.9 V
Brady Statistic AP VP Percent: 0.03 %
Brady Statistic AP VS Percent: 0.01 %
Brady Statistic AS VP Percent: 98.64 %
Brady Statistic AS VS Percent: 1.32 %
Brady Statistic RA Percent Paced: 0.04 %
Brady Statistic RV Percent Paced: 2.58 %
Date Time Interrogation Session: 20210709022601
HighPow Impedance: 99 Ohm
Implantable Lead Implant Date: 20160421
Implantable Lead Implant Date: 20160421
Implantable Lead Implant Date: 20161006
Implantable Lead Implant Date: 20161006
Implantable Lead Location: 753858
Implantable Lead Location: 753858
Implantable Lead Location: 753859
Implantable Lead Location: 753860
Implantable Lead Model: 5071
Implantable Lead Model: 5071
Implantable Lead Model: 5076
Implantable Pulse Generator Implant Date: 20160421
Lead Channel Impedance Value: 323 Ohm
Lead Channel Impedance Value: 342 Ohm
Lead Channel Impedance Value: 399 Ohm
Lead Channel Impedance Value: 399 Ohm
Lead Channel Impedance Value: 4047 Ohm
Lead Channel Impedance Value: 4047 Ohm
Lead Channel Pacing Threshold Amplitude: 0.5 V
Lead Channel Pacing Threshold Amplitude: 1.25 V
Lead Channel Pacing Threshold Pulse Width: 0.4 ms
Lead Channel Pacing Threshold Pulse Width: 0.4 ms
Lead Channel Sensing Intrinsic Amplitude: 24.875 mV
Lead Channel Sensing Intrinsic Amplitude: 24.875 mV
Lead Channel Sensing Intrinsic Amplitude: 3.875 mV
Lead Channel Sensing Intrinsic Amplitude: 3.875 mV
Lead Channel Setting Pacing Amplitude: 1.5 V
Lead Channel Setting Pacing Amplitude: 2 V
Lead Channel Setting Pacing Amplitude: 2.5 V
Lead Channel Setting Pacing Pulse Width: 0.4 ms
Lead Channel Setting Pacing Pulse Width: 0.4 ms
Lead Channel Setting Sensing Sensitivity: 0.3 mV

## 2019-09-05 ENCOUNTER — Ambulatory Visit (INDEPENDENT_AMBULATORY_CARE_PROVIDER_SITE_OTHER): Payer: BC Managed Care – PPO

## 2019-09-05 ENCOUNTER — Other Ambulatory Visit: Payer: Self-pay | Admitting: Internal Medicine

## 2019-09-05 ENCOUNTER — Other Ambulatory Visit: Payer: Self-pay | Admitting: Cardiovascular Disease

## 2019-09-05 DIAGNOSIS — I5032 Chronic diastolic (congestive) heart failure: Secondary | ICD-10-CM

## 2019-09-05 DIAGNOSIS — Z95810 Presence of automatic (implantable) cardiac defibrillator: Secondary | ICD-10-CM | POA: Diagnosis not present

## 2019-09-05 NOTE — Progress Notes (Signed)
Remote ICD transmission.   

## 2019-09-06 NOTE — Addendum Note (Signed)
Addended by: Douglass Rivers D on: 09/06/2019 11:19 AM   Modules accepted: Level of Service

## 2019-09-07 NOTE — Progress Notes (Signed)
EPIC Encounter for ICM Monitoring  Patient Name: Suzanne Spears is a 59 y.o. female Date: 09/07/2019 Primary Care Physican: [name], MD Primary Cardiologist: Rockey Situ Electrophysiologist: Vergie Living Pacing: 98.5% 09/07/2019 Weight: 149lbs     Spoke with patient and she said she is doing well.  She has started Entresto and BP is stable.  OptiVolThoracic impedancenormal.  Prescribed:  Furosemide 20 mgtake2tablets(40 mg total)every other day alternating with 1 tablet (20 mg total) every other day.  Potassium 10 mEq take 1 tablet daily  Labs: 10/28/2018 Creatinine1.23, BUN28, Potassium3.8, Sodium143, [id number] A complete set of results can be found in Results Review.  Recommendations:Recommendation to limit salt intake to 2000 mg daily and fluid intake to 64 oz daily.  Encouraged to call if experiencing any fluid symptoms.  Follow-up plan: ICM clinic phone appointment on 10/10/2019.   91 day device clinic remote transmission 12/02/2019.    EP/Cardiology Office Visits: 10/25/2019 with Dr Caryl Comes and 11/28/2019 with Dr. Rockey Situ.    Copy of ICM check sent to Dr. Caryl Comes.   3 month ICM trend: 09/05/2019    1 Year ICM trend:       [name], RN 09/07/2019 9:06 AM

## 2019-09-09 ENCOUNTER — Telehealth: Payer: Self-pay | Admitting: Cardiovascular Disease

## 2019-09-09 ENCOUNTER — Ambulatory Visit (INDEPENDENT_AMBULATORY_CARE_PROVIDER_SITE_OTHER): Payer: BC Managed Care – PPO | Admitting: Primary Care

## 2019-09-09 DIAGNOSIS — J4521 Mild intermittent asthma with (acute) exacerbation: Secondary | ICD-10-CM

## 2019-09-09 MED ORDER — METHYLPREDNISOLONE 4 MG PO TABS: ORAL_TABLET | ORAL | 0 refills | Status: DC

## 2019-09-09 MED ORDER — ALBUTEROL SULFATE (2.5 MG/3ML) 0.083% IN NEBU: INHALATION_SOLUTION | RESPIRATORY_TRACT | 1 refills | Status: DC
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
## 2019-09-09 NOTE — Telephone Encounter (Signed)
Spoke with the patient. Patient sts that she has been having a cough and chest tightness for several days. She was seen by pulmonology yesterday and was started on steroid and nebulizer treatments.  Patient sts that sher cough, and chest tightness are better today. She is not sure if Delene Loll could be the cause of her cough and chest tightness or if it is related to her breathing issues. She denies and swelling or angie edema. Patient checks her BP regularly and it is always low. She did not provide any specific readings buts stst that it is usually in the 90-100's/60's.  Adv the patient that if her cough and chest tightness responds to the steroid and albuterol it may be less likely that Delene Loll is the cause of her symptoms and more likely related to her lungs. Adv the patient that I will fwd the message to Dr. Rockey Situ for his recommendation.  Patient also would like to know if Dr. Rockey Situ would write her a letter stating that she needs to continue working form home and should not return to the office at this time due to her cardiac dx.  Advised the patient that I will fwd the message to Dr. Rockey Situ.

## 2019-09-09 NOTE — Patient Instructions (Signed)
Treating for suspected asthmatic bronchitis Recommend course of methylprednisolone AND use albuterol nebulizer every 6 hours for chest tightness  Monitor blood pressure if less than 90/60 contact MD that prescribed Entresto Encourage oral fluids and brat diet  Follow-up If no improvement follow-up with PCP or our office; if symptoms worsen over the weekend recommend you present to urgent care

## 2019-09-09 NOTE — Telephone Encounter (Signed)
Patient calling  Not sure if she is having side effects from [facility] medication  She has developed a cough and some chest tightness She had an appointment with pulmonary and they have sent in albuterol droplets and a steroid  Would like to know if we would have any recommendations Please call to discuss

## 2019-09-11 NOTE — Telephone Encounter (Signed)
Okay to write a letter to continue work from home Would see how her breathing goes on the pulmonary medication

## 2019-09-13 NOTE — Telephone Encounter (Signed)
Spoke with patient and she reports that her pulmonary status seems to be improving. She states that she is feeling better today than yesterday. She is still on medications for her pulmonary issues. Reviewed that provider did approve work note for her to work from home. She was appreciative for the call and requested that we could mail to her home address. She had no further questions or concerns at this time.

## 2019-09-15 ENCOUNTER — Encounter: Payer: Self-pay | Admitting: *Deleted

## 2019-09-15 NOTE — Telephone Encounter (Signed)
Letter done. Released to her MyChart and a copy of was also mailed to the patient.

## 2019-09-20 ENCOUNTER — Other Ambulatory Visit: Payer: Self-pay | Admitting: Cardiovascular Disease

## 2019-09-20 DIAGNOSIS — I5032 Chronic diastolic (congestive) heart failure: Secondary | ICD-10-CM

## 2019-10-10 ENCOUNTER — Telehealth: Payer: Self-pay

## 2019-10-10 ENCOUNTER — Ambulatory Visit (INDEPENDENT_AMBULATORY_CARE_PROVIDER_SITE_OTHER): Payer: BC Managed Care – PPO

## 2019-10-10 DIAGNOSIS — I5032 Chronic diastolic (congestive) heart failure: Secondary | ICD-10-CM

## 2019-10-10 DIAGNOSIS — Z95810 Presence of automatic (implantable) cardiac defibrillator: Secondary | ICD-10-CM | POA: Diagnosis not present

## 2019-10-10 NOTE — Telephone Encounter (Signed)
Remote ICM transmission received.  Attempted call to patient regarding ICM remote transmission and left detailed message per DPR.  Advised to return call for any fluid symptoms or questions. Next ICM remote transmission scheduled 10/17/2019.   ° ° °

## 2019-10-10 NOTE — Progress Notes (Signed)
EPIC Encounter for ICM Monitoring  Patient Name: Suzanne Spears is a 59 y.o. female Date: 10/10/2019 Primary Care Physican: [name], MD Primary Cardiologist: Rockey Situ Electrophysiologist: Vergie Living Pacing: 98.6% 7/14/2021Weight: 149lbs     Attempted call to patient and unable to reach.  Left detailed message per DPR regarding transmission. Transmission reviewed.   OptiVolThoracic impedancesuggesting possible fluid accumulation since 09/28/2019.  Prescribed:  Furosemide 20 mgtake2tablets(40 mg total)every other day alternating with 1 tablet (20 mg total) every other day.  Potassium 10 mEq take 1 tablet daily  Labs: 10/28/2018 Creatinine1.23, BUN28, Potassium3.8, Sodium143, [id number] A complete set of results can be found in Results Review.  Recommendations:Left voice mail with ICM number and encouraged to call if experiencing any fluid symptoms.  Follow-up plan: ICM clinic phone appointment on 10/17/2019 (manual send) to recheck fluid levels.   91 day device clinic remote transmission 12/02/2019.    EP/Cardiology Office Visits: 10/25/2019 with Dr Caryl Comes and 11/28/2019 with Dr. Rockey Situ.    Copy of ICM check sent to Dr. Caryl Comes and Dr Rockey Situ for review.   3 month ICM trend: 10/10/2019    1 Year ICM trend:       [name], RN 10/10/2019 10:19 AM

## 2019-10-17 ENCOUNTER — Ambulatory Visit (INDEPENDENT_AMBULATORY_CARE_PROVIDER_SITE_OTHER): Payer: BC Managed Care – PPO

## 2019-10-17 DIAGNOSIS — I5032 Chronic diastolic (congestive) heart failure: Secondary | ICD-10-CM

## 2019-10-17 DIAGNOSIS — Z95810 Presence of automatic (implantable) cardiac defibrillator: Secondary | ICD-10-CM

## 2019-10-17 NOTE — Progress Notes (Signed)
EPIC Encounter for ICM Monitoring  Patient Name: Suzanne Spears is a 59 y.o. female Date: 10/17/2019 Primary Care Physican: [name], MD Primary Cardiologist: Rockey Situ Electrophysiologist: Vergie Living Pacing: 98.5% 8/23/2021Weight:151lbs     Spoke with patient and reports weight increased 2 lbs.  She was out of town 8/12-8/13.   BP has been low for a few days since taking Entresto.  She has not been following a low salt diet.  OptiVolThoracic impedancesuggesting possible fluid accumulation since 10/13/2019.  Prescribed:  Furosemide 20 mgtake2tablets(40 mg total)every other day alternating with 1 tablet (20 mg total) every other day.  Potassium 10 mEqtake1 tablet daily  Labs: 10/28/2018 Creatinine1.23, BUN28, Potassium3.8, Sodium143, [id number] A complete set of results can be found in Results Review.  Recommendations: Recommendation to limit salt intake to 2000 mg daily and fluid intake to 64 oz daily.  Encouraged to call if experiencing any fluid symptoms.   Follow-up plan: ICM clinic phone appointment on 11/14/2019 since fluid levels will be checked at 10/25/2019 OV. 91 day device clinic remote transmission 12/02/2019.   EP/Cardiology Office Visits:10/25/2019 with Dr Caryl Comes and 11/28/2019 with Dr.Gollan.   Copy of ICM check sent to Dr.Klein and Dr Rockey Situ for review.    3 month ICM trend: 10/17/2019    1 Year ICM trend:       [name], RN 10/17/2019 3:12 PM

## 2019-10-18 ENCOUNTER — Telehealth: Payer: Self-pay | Admitting: Cardiovascular Disease

## 2019-10-18 NOTE — Telephone Encounter (Signed)
Patient returning call.

## 2019-10-18 NOTE — Telephone Encounter (Signed)
Spoke with patient and she reports that this has been going on since Saturday with the low blood pressures. She states that it will usually perk back up. Husband did manual 112/64 after she did some walking. She has been having dizziness with the low blood pressures. She woke up very tired on Saturday and was dizzy when she first got up. She spoke with Cecille Rubin as well and her fluid level is up. She took 2 fluid pills this morning. She is not feeling well with those low pressures. Advised that I would send over to Dr. Rockey Situ for his review and would call back with his recommendations. She verbalized understanding with no further questions.

## 2019-10-18 NOTE — Telephone Encounter (Signed)
Pt c/o BP issue: STAT if pt c/o blurred vision, one-sided weakness or slurred speech  1. What are your last 5 BP readings?  Today left arm 106/69, rt arm 102/69 Sunday - left arm 89/58, rt arm 80/59, then went up to 105/79, and 99/66  Saturday -left arm 84/59, rt arm 90/64  2. Are you having any other symptoms (ex. Dizziness, headache, blurred vision, passed out)? Dizziness and a slight headache.

## 2019-10-19 NOTE — Telephone Encounter (Signed)
Patient calling back stating today her BP were a little higher than previously stated 102/73- R 108/74-L  Please advise

## 2019-10-20 NOTE — Telephone Encounter (Signed)
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 Patients BP is running lower during a manual cuff than her home cuff. 110/72 manual and 120/79. Patient wants to make sure we aware all of her previous numbers are off now

## 2019-10-20 NOTE — Telephone Encounter (Signed)
Patient states she is "tired of feeling like crap". States she has left multiple messages and would like a call back.

## 2019-10-20 NOTE — Telephone Encounter (Signed)
Spoke with patient.  Reports she is "shaky inside" and fatigued. It is common for her to feel fatigued. THe dizziness occurs upon changing position.  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 Manuel BP was 110/72 BP cuff was 120/79.  Patient does not know if she should continue her lasix or make some adjustments. Dr Rockey Situ has not responded at this time with advice. Patient is concerned and agreeable to come in for appointment with DOD tomorrow at 2:20 pm. She will see Dr. Garen Lah.  She will continue to monitor and stay hydrated.

## 2019-10-21 ENCOUNTER — Ambulatory Visit (INDEPENDENT_AMBULATORY_CARE_PROVIDER_SITE_OTHER): Payer: BC Managed Care – PPO | Admitting: Cardiology

## 2019-10-21 ENCOUNTER — Other Ambulatory Visit: Payer: Self-pay

## 2019-10-21 ENCOUNTER — Encounter: Payer: Self-pay | Admitting: Cardiology

## 2019-10-21 VITALS — BP 100/68 | HR 79 | Ht 65.5 in | Wt 151.2 lb

## 2019-10-21 DIAGNOSIS — Z95810 Presence of automatic (implantable) cardiac defibrillator: Secondary | ICD-10-CM | POA: Diagnosis not present

## 2019-10-21 DIAGNOSIS — I5032 Chronic diastolic (congestive) heart failure: Secondary | ICD-10-CM | POA: Diagnosis not present

## 2019-10-21 DIAGNOSIS — I428 Other cardiomyopathies: Secondary | ICD-10-CM | POA: Diagnosis not present

## 2019-10-21 MED ORDER — FUROSEMIDE 20 MG PO TABS: 20.0000 mg | ORAL_TABLET | Freq: Every day | ORAL | 5 refills | Status: DC
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
## 2019-10-21 NOTE — Patient Instructions (Signed)
Medication Instructions:   Your physician has recommended you make the following change in your medication:   Take your furosemide (LASIX) 20 MG tablet: Take 1 tablet (20 mg total) by mouth daily.  *If you need a refill on your cardiac medications before your next appointment, please call your pharmacy*   Lab Work: None Ordered If you have labs (blood work) drawn today and your tests are completely normal, you will receive your results only by: Marland Kitchen MyChart Message (if you have MyChart) OR . A paper copy in the mail If you have any lab test that is abnormal or we need to change your treatment, we will call you to review the results.   Testing/Procedures: None Ordered   Follow-Up: At [facility], you and your health needs are our priority.  As part of our continuing mission to provide you with exceptional heart care, we have created designated Provider Care Teams.  These Care Teams include your primary Cardiologist (physician) and Advanced Practice Providers (APPs -  Physician Assistants and Nurse Practitioners) who all work together to provide you with the care you need, when you need it.  We recommend signing up for the patient portal called "MyChart".  Sign up information is provided on this After Visit Summary.  MyChart is used to connect with patients for Virtual Visits (Telemedicine).  Patients are able to view lab/test results, encounter notes, upcoming appointments, etc.  Non-urgent messages can be sent to your provider as well.   To learn more about what you can do with MyChart, go to [email or web address].    Your next appointment:   Follow up with your already scheduled appointments with Dr. Caryl Comes and Dr. Rockey Situ   The format for your next appointment:   In Person

## 2019-10-25 ENCOUNTER — Other Ambulatory Visit: Payer: Self-pay

## 2019-10-25 ENCOUNTER — Encounter: Payer: Self-pay | Admitting: Internal Medicine

## 2019-10-25 ENCOUNTER — Ambulatory Visit (INDEPENDENT_AMBULATORY_CARE_PROVIDER_SITE_OTHER): Payer: BC Managed Care – PPO | Admitting: Internal Medicine

## 2019-10-25 VITALS — BP 116/68 | HR 81 | Ht 65.0 in | Wt 149.2 lb

## 2019-10-25 DIAGNOSIS — I10 Essential (primary) hypertension: Secondary | ICD-10-CM

## 2019-10-25 DIAGNOSIS — I5032 Chronic diastolic (congestive) heart failure: Secondary | ICD-10-CM | POA: Diagnosis not present

## 2019-10-25 DIAGNOSIS — I428 Other cardiomyopathies: Secondary | ICD-10-CM | POA: Diagnosis not present

## 2019-10-25 DIAGNOSIS — Z95810 Presence of automatic (implantable) cardiac defibrillator: Secondary | ICD-10-CM | POA: Diagnosis not present

## 2019-10-25 NOTE — Patient Instructions (Signed)
Medication Instructions:  - Your physician recommends that you continue on your current medications as directed. Please refer to the Current Medication list given to you today.  *If you need a refill on your cardiac medications before your next appointment, please call your pharmacy*   Lab Work: - Your physician recommends that you have lab work today: BMP  If you have labs (blood work) drawn today and your tests are completely normal, you will receive your results only by: Marland Kitchen MyChart Message (if you have MyChart) OR . A paper copy in the mail If you have any lab test that is abnormal or we need to change your treatment, we will call you to review the results.   Testing/Procedures: - none ordered   Follow-Up: At [facility], you and your health needs are our priority.  As part of our continuing mission to provide you with exceptional heart care, we have created designated Provider Care Teams.  These Care Teams include your primary Cardiologist (physician) and Advanced Practice Providers (APPs -  Physician Assistants and Nurse Practitioners) who all work together to provide you with the care you need, when you need it.  We recommend signing up for the patient portal called "MyChart".  Sign up information is provided on this After Visit Summary.  MyChart is used to connect with patients for Virtual Visits (Telemedicine).  Patients are able to view lab/test results, encounter notes, upcoming appointments, etc.  Non-urgent messages can be sent to your provider as well.   To learn more about what you can do with MyChart, go to [email or web address].    Your next appointment:   1 year(s)  The format for your next appointment:   In Person  Provider:   Virl Axe, MD   Other Instructions n/a
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
## 2019-10-26 LAB — BASIC METABOLIC PANEL
BUN/Creatinine Ratio: 18 (ref 9–23)
BUN: 26 mg/dL — ABNORMAL HIGH (ref 6–24)
CO2: 25 mmol/L (ref 20–29)
Calcium: 10 mg/dL (ref 8.7–10.2)
Chloride: 99 mmol/L (ref 96–106)
Creatinine, Ser: 1.45 mg/dL — ABNORMAL HIGH (ref 0.57–1.00)
GFR calc Af Amer: 46 mL/min/{1.73_m2} — ABNORMAL LOW (ref >59)
GFR calc non Af Amer: 40 mL/min/{1.73_m2} — ABNORMAL LOW (ref >59)
Glucose: 104 mg/dL — ABNORMAL HIGH (ref 65–99)
Potassium: 4 mmol/L (ref 3.5–5.2)
Sodium: 139 mmol/L (ref 134–144)

## 2019-10-27 ENCOUNTER — Other Ambulatory Visit: Payer: Self-pay | Admitting: Internal Medicine

## 2019-10-27 ENCOUNTER — Telehealth: Payer: Self-pay | Admitting: Internal Medicine

## 2019-10-27 DIAGNOSIS — R928 Other abnormal and inconclusive findings on diagnostic imaging of breast: Secondary | ICD-10-CM

## 2019-10-27 DIAGNOSIS — R921 Mammographic calcification found on diagnostic imaging of breast: Secondary | ICD-10-CM

## 2019-10-27 NOTE — Telephone Encounter (Signed)
I spoke with the patient about her BMP results.  I advised her of Dr. Olin Pia recommendations to repeat this in ~ 8 weeks.  The patient states she is seeing her PCP, Dr. Derrel Nip, on 11/10/19 and that she will be repeating labs at that time. I advised the patient that I will follow up on those results in a few weeks and review with Dr. Caryl Comes.  She is aware I will reach out to her after that and confirm if she needs to have this repeated in 8 weeks as recommended.  The patient voices understanding and is agreeable.

## 2019-10-27 NOTE — Telephone Encounter (Signed)
Deboraha Sprang, MD  10/27/2019 9:29 AM EDT     Please Inform Patient  Labs are normal x *modest change in renal function  would recheck please in abut 8 weeks   Thanks

## 2019-11-02 ENCOUNTER — Other Ambulatory Visit: Payer: Self-pay | Admitting: Internal Medicine

## 2019-11-04 ENCOUNTER — Ambulatory Visit
Admission: EM | Admit: 2019-11-04 | Discharge: 2019-11-04 | Disposition: A | Discharge status: Home / Self Care | Payer: BC Managed Care – PPO | Attending: Family Medicine | Admitting: Family Medicine

## 2019-11-04 ENCOUNTER — Other Ambulatory Visit: Payer: Self-pay

## 2019-11-04 DIAGNOSIS — R0789 Other chest pain: Secondary | ICD-10-CM | POA: Diagnosis present

## 2019-11-04 LAB — CBC WITH DIFFERENTIAL/PLATELET
Abs Immature Granulocytes: 0.01 10*3/uL (ref 0.00–0.07)
Basophils Absolute: 0 10*3/uL (ref 0.0–0.1)
Basophils Relative: 1 %
Eosinophils Absolute: 0.1 10*3/uL (ref 0.0–0.5)
Eosinophils Relative: 2 %
HCT: 39.5 % (ref 36.0–46.0)
Hemoglobin: 13.3 g/dL (ref 12.0–15.0)
Immature Granulocytes: 0 %
Lymphocytes Relative: 33 %
Lymphs Abs: 1.5 10*3/uL (ref 0.7–4.0)
MCH: 31.7 pg (ref 26.0–34.0)
MCHC: 33.7 g/dL (ref 30.0–36.0)
MCV: 94.3 fL (ref 80.0–100.0)
Monocytes Absolute: 0.6 10*3/uL (ref 0.1–1.0)
Monocytes Relative: 14 %
Neutro Abs: 2.3 10*3/uL (ref 1.7–7.7)
Neutrophils Relative %: 50 %
Platelets: 185 10*3/uL (ref 150–400)
RBC: 4.19 MIL/uL (ref 3.87–5.11)
RDW: 13.1 % (ref 11.5–15.5)
WBC: 4.5 10*3/uL (ref 4.0–10.5)
nRBC: 0 % (ref 0.0–0.2)

## 2019-11-04 LAB — COMPREHENSIVE METABOLIC PANEL
ALT: 16 U/L (ref 0–44)
AST: 20 U/L (ref 15–41)
Albumin: 4 g/dL (ref 3.5–5.0)
Alkaline Phosphatase: 84 U/L (ref 38–126)
Anion gap: 7 (ref 5–15)
BUN: 21 mg/dL — ABNORMAL HIGH (ref 6–20)
CO2: 29 mmol/L (ref 22–32)
Calcium: 9 mg/dL (ref 8.9–10.3)
Chloride: 103 mmol/L (ref 98–111)
Creatinine, Ser: 1.26 mg/dL — ABNORMAL HIGH (ref 0.44–1.00)
GFR calc Af Amer: 54 mL/min — ABNORMAL LOW (ref >60)
GFR calc non Af Amer: 47 mL/min — ABNORMAL LOW (ref >60)
Glucose, Bld: 88 mg/dL (ref 70–99)
Potassium: 3.8 mmol/L (ref 3.5–5.1)
Sodium: 139 mmol/L (ref 135–145)
Total Bilirubin: 0.3 mg/dL (ref 0.3–1.2)
Total Protein: 6.8 g/dL (ref 6.5–8.1)

## 2019-11-04 LAB — TROPONIN I (HIGH SENSITIVITY): Troponin I (High Sensitivity): 6 ng/L (ref <18)

## 2019-11-04 LAB — CKMB (ARMC ONLY): CK, MB: 0.9 ng/mL (ref 0.5–5.0)

## 2019-11-04 NOTE — ED Triage Notes (Signed)
Pt reports reproducable chest pain in center of chest. States hurts worse with deep breathing and painful to touch.
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
## 2019-11-04 NOTE — Discharge Instructions (Signed)
Albuterol as needed.  Rest.  If you worsen, go to the ER.  Take care  Dr. Lacinda Axon

## 2019-11-10 ENCOUNTER — Encounter: Payer: Self-pay | Admitting: Internal Medicine

## 2019-11-10 ENCOUNTER — Ambulatory Visit (INDEPENDENT_AMBULATORY_CARE_PROVIDER_SITE_OTHER): Payer: BC Managed Care – PPO | Admitting: Internal Medicine

## 2019-11-10 ENCOUNTER — Other Ambulatory Visit: Payer: Self-pay

## 2019-11-10 ENCOUNTER — Other Ambulatory Visit (HOSPITAL_COMMUNITY)
Admission: RE | Admit: 2019-11-10 | Discharge: 2019-11-10 | Disposition: A | Discharge status: Home / Self Care | Payer: BC Managed Care – PPO | Source: Ambulatory Visit | Attending: Internal Medicine | Admitting: Internal Medicine

## 2019-11-10 VITALS — BP 94/60 | HR 80 | Temp 98.7°F | Resp 15 | Ht 65.0 in | Wt 148.8 lb

## 2019-11-10 DIAGNOSIS — R5383 Other fatigue: Secondary | ICD-10-CM | POA: Diagnosis not present

## 2019-11-10 DIAGNOSIS — R921 Mammographic calcification found on diagnostic imaging of breast: Secondary | ICD-10-CM

## 2019-11-10 DIAGNOSIS — Z789 Other specified health status: Secondary | ICD-10-CM

## 2019-11-10 DIAGNOSIS — F418 Other specified anxiety disorders: Secondary | ICD-10-CM

## 2019-11-10 DIAGNOSIS — T466X5A Adverse effect of antihyperlipidemic and antiarteriosclerotic drugs, initial encounter: Secondary | ICD-10-CM

## 2019-11-10 DIAGNOSIS — Z23 Encounter for immunization: Secondary | ICD-10-CM | POA: Diagnosis not present

## 2019-11-10 DIAGNOSIS — M791 Myalgia, unspecified site: Secondary | ICD-10-CM | POA: Diagnosis not present

## 2019-11-10 DIAGNOSIS — Z8616 Personal history of COVID-19: Secondary | ICD-10-CM | POA: Diagnosis not present

## 2019-11-10 DIAGNOSIS — N941 Unspecified dyspareunia: Secondary | ICD-10-CM

## 2019-11-10 DIAGNOSIS — Z0000 Encounter for general adult medical examination without abnormal findings: Secondary | ICD-10-CM

## 2019-11-10 DIAGNOSIS — I420 Dilated cardiomyopathy: Secondary | ICD-10-CM | POA: Diagnosis not present

## 2019-11-10 DIAGNOSIS — Z124 Encounter for screening for malignant neoplasm of cervix: Secondary | ICD-10-CM | POA: Insufficient documentation

## 2019-11-10 LAB — LIPID PANEL
Cholesterol: 258 mg/dL — ABNORMAL HIGH (ref 0–200)
HDL: 56.7 mg/dL (ref >39.00)
LDL Cholesterol: 185 mg/dL — ABNORMAL HIGH (ref 0–99)
NonHDL: 201.25
Total CHOL/HDL Ratio: 5
Triglycerides: 83 mg/dL (ref 0.0–149.0)
VLDL: 16.6 mg/dL (ref 0.0–40.0)

## 2019-11-10 LAB — TSH: TSH: 0.92 u[IU]/mL (ref 0.35–4.50)

## 2019-11-10 MED ORDER — VENLAFAXINE HCL ER 37.5 MG PO CP24: 37.5000 mg | ORAL_CAPSULE | Freq: Every day | ORAL | 0 refills | Status: DC

## 2019-11-10 NOTE — Assessment & Plan Note (Signed)
DIAGNOSED January 2021

## 2019-11-10 NOTE — Patient Instructions (Addendum)
Reduce lasix to 10 mg alternating with 20 mg daily and record your weight DAILY to monitor for fluid retention    Trial of effexor instead of Celexa  :  Continue lexapro at reduced dose 10  mg daily for one week  The reduce to 10 mg every other day  for one week,  Then stop  Start effexor at one tablet daily with  breakfast now. .  After one week increase dose to 2 tablets daily at the same time .    After two weeks of 2 tablets daily,   Touch base with me by Deloris Ping /video  (3 weeks from start)   Health Maintenance for Postmenopausal Women Menopause is a normal process in which your ability to get pregnant comes to an end. This process happens slowly over many months or years, usually between the ages of 8 and 60. Menopause is complete when you have missed your menstrual periods for 12 months. It is important to talk with your health care provider about some of the most common conditions that affect women after menopause (postmenopausal women). These include heart disease, cancer, and bone loss (osteoporosis). Adopting a healthy lifestyle and getting preventive care can help to promote your health and wellness. The actions you take can also lower your chances of developing some of these common conditions. What should I know about menopause? During menopause, you may get a number of symptoms, such as:  Hot flashes. These can be moderate or severe.  Night sweats.  Decrease in sex drive.  Mood swings.  Headaches.  Tiredness.  Irritability.  Memory problems.  Insomnia. Choosing to treat or not to treat these symptoms is a decision that you make with your health care provider. Do I need hormone replacement therapy?  Hormone replacement therapy is effective in treating symptoms that are caused by menopause, such as hot flashes and night sweats.  Hormone replacement carries certain risks, especially as you become older. If you are thinking about using estrogen or estrogen with  progestin, discuss the benefits and risks with your health care provider. What is my risk for heart disease and stroke? The risk of heart disease, heart attack, and stroke increases as you age. One of the causes may be a change in the body's hormones during menopause. This can affect how your body uses dietary fats, triglycerides, and cholesterol. Heart attack and stroke are medical emergencies. There are many things that you can do to help prevent heart disease and stroke. Watch your blood pressure  High blood pressure causes heart disease and increases the risk of stroke. This is more likely to develop in people who have high blood pressure readings, are of African descent, or are overweight.  Have your blood pressure checked: ? Every 3-5 years if you are 32-20 years of age. ? Every year if you are 35 years old or older. Eat a healthy diet   Eat a diet that includes plenty of vegetables, fruits, low-fat dairy products, and lean protein.  Do not eat a lot of foods that are high in solid fats, added sugars, or sodium. Get regular exercise Get regular exercise. This is one of the most important things you can do for your health. Most adults should:  Try to exercise for at least 150 minutes each week. The exercise should increase your heart rate and make you sweat (moderate-intensity exercise).  Try to do strengthening exercises at least twice each week. Do these in addition to the moderate-intensity exercise.  Spend  less time sitting. Even light physical activity can be beneficial. Other tips  Work with your health care provider to achieve or maintain a healthy weight.  Do not use any products that contain nicotine or tobacco, such as cigarettes, e-cigarettes, and chewing tobacco. If you need help quitting, ask your health care provider.  Know your numbers. Ask your health care provider to check your cholesterol and your blood sugar (glucose). Continue to have your blood tested as  directed by your health care provider. Do I need screening for cancer? Depending on your health history and family history, you may need to have cancer screening at different stages of your life. This may include screening for:  Breast cancer.  Cervical cancer.  Lung cancer.  Colorectal cancer. What is my risk for osteoporosis? After menopause, you may be at increased risk for osteoporosis. Osteoporosis is a condition in which bone destruction happens more quickly than new bone creation. To help prevent osteoporosis or the bone fractures that can happen because of osteoporosis, you may take the following actions:  If you are 32-51 years old, get at least 1,000 mg of calcium and at least 600 mg of vitamin D per day.  If you are older than age 83 but younger than age 60, get at least 1,200 mg of calcium and at least 600 mg of vitamin D per day.  If you are older than age 23, get at least 1,200 mg of calcium and at least 800 mg of vitamin D per day. Smoking and drinking excessive alcohol increase the risk of osteoporosis. Eat foods that are rich in calcium and vitamin D, and do weight-bearing exercises several times each week as directed by your health care provider. How does menopause affect my mental health? Depression may occur at any age, but it is more common as you become older. Common symptoms of depression include:  Low or sad mood.  Changes in sleep patterns.  Changes in appetite or eating patterns.  Feeling an overall lack of motivation or enjoyment of activities that you previously enjoyed.  Frequent crying spells. Talk with your health care provider if you think that you are experiencing depression. General instructions See your health care provider for regular wellness exams and vaccines. This may include:  Scheduling regular health, dental, and eye exams.  Getting and maintaining your vaccines. These include: ? Influenza vaccine. Get this vaccine each year before the  flu season begins. ? Pneumonia vaccine. ? Shingles vaccine. ? Tetanus, diphtheria, and pertussis (Tdap) booster vaccine. Your health care provider may also recommend other immunizations. Tell your health care provider if you have ever been abused or do not feel safe at home. Summary  Menopause is a normal process in which your ability to get pregnant comes to an end.  This condition causes hot flashes, night sweats, decreased interest in sex, mood swings, headaches, or lack of sleep.  Treatment for this condition may include hormone replacement therapy.  Take actions to keep yourself healthy, including exercising regularly, eating a healthy diet, watching your weight, and checking your blood pressure and blood sugar levels.  Get screened for cancer and depression. Make sure that you are up to date with all your vaccines. This information is not intended to replace advice given to you by your health care provider. Make sure you discuss any questions you have with your health care provider. Document Revised: 02/03/2018 Document Reviewed: 02/03/2018 Elsevier Patient Education  2020 [employer].

## 2019-11-10 NOTE — Progress Notes (Signed)
Patient ID: Suzanne Spears, female    DOB: January 17, 1961  Age: 59 y.o. MRN: [id number]  The patient is here for annual  PREVENTIVE  examination and management of other chronic and acute problems.   This visit occurred during the SARS-CoV-2 public health emergency.  Safety protocols were in place, including screening questions prior to the visit, additional usage of staff PPE, and extensive cleaning of exam room while observing appropriate contact time as indicated for disinfecting solutions.    Patient has received both doses of the available COVID 19 vaccine without complications.  Patient continues to mask when outside of the home except when walking in yard or at safe distances from others .  Patient denies any change in mood or development of unhealthy behaviors resuting from the pandemic's restriction of activities and socialization.    The risk factors are reflected in the social history.  The roster of all physicians providing medical care to patient - is listed in the Snapshot section of the chart.    Health Maintenance  Topic Date Due  . Mammogram  06/07/2019  . Colon Cancer Screening  12/19/2020  . Pap Smear  11/10/2022  . Tetanus Vaccine  08/26/2024  . Flu Shot  Completed  . COVID-19 Vaccine  Completed  .  Hepatitis C: One time screening is recommended by Center for Disease Control  (CDC) for  adults born from [birth year] through 1964.   Completed  . HIV Screening  Completed    Activities of daily living:  The patient is 100% independent in all ADLs: dressing, toileting, feeding as well as independent mobility  Home safety : The patient has smoke detectors in the home. They wear seatbelts.  There are no firearms at home. There is no violence in the home.   There is no risks for hepatitis, STDs or HIV. There is no   history of blood transfusion. They have no travel history to infectious disease endemic areas of the world.  The patient has seen their dentist in the last six month. They  have seen their eye doctor in the last year. They admit to slight hearing difficulty with regard to whispered voices and some television programs.  They have deferred audiologic testing in the last year.  They do not  have excessive sun exposure. Discussed the need for sun protection: hats, long sleeves and use of sunscreen if there is significant sun exposure.   Diet: the importance of a healthy diet is discussed. They do have a healthy diet.  The benefits of regular aerobic exercise were discussed. She walks 4 times per week ,  20 minutes.   Depression screen: there are multiple   symptoms of undertreated depression- irritability, change in appetite, anhedonia, sadness/tearfullness.   The following portions of the patient's history were reviewed and updated as appropriate: allergies, current medications, past family history, past medical history,  past surgical history, past social history  and problem list.  Visual acuity was not assessed per patient preference since she has regular follow up with her ophthalmologist. Hearing and body mass index were assessed and reviewed.   During the course of the visit the patient was educated and counseled about appropriate screening and preventive services including : fall prevention , diabetes screening, nutrition counseling, colorectal cancer screening, and recommended immunizations.    CC: The primary encounter diagnosis was Encounter for preventive health examination. Diagnoses of History of 2019 novel coronavirus disease (COVID-19), Cardiomyopathy, dilated, nonischemic (HCC), Cervical cancer screening, Fatigue, unspecified type, Need  for immunization against influenza, Depression with anxiety, Myalgia due to statin, Breast calcifications on mammogram, Dyspareunia, female, and Statin intolerance were also pertinent to this visit.  Grief and emotional duress due to loss of dear friend to [place].  Children won't get vaccinated and she is afraid for them.  Taking cymbalta and citalopram .. taking 1/2 alprazolam is too sedating . Discussed change in therapy from citalopram to effexor.  cymbalta being used due to concurrent chronic pain.  MENOPAUSE: vaginal dryness,  Dyspareunia     LOW BP : reviewed medications including diuretics    History Jaylynne has a past medical history of AICD (automatic cardioverter/defibrillator) present, Allergy, Anemia, Asthma, Cardiomyopathy, dilated, nonischemic (HCC), Chronic systolic (congestive) heart failure ([place]), Cough, Depression, Dizziness, GERD (gastroesophageal reflux disease), Hip pain, right (200), History of 2019 novel coronavirus disease (COVID-19) (03/11/2019), History of bronchitis, History of shingles, Hyperlipidemia, Hypertension, Left bundle branch block (2008), Migraine, Mitral valve prolapse syndrome, Pericarditis (2008), Peripheral neuropathy, Pneumonia (2016), Presence of permanent cardiac pacemaker, and Spinal headache.   She has a past surgical history that includes Cesarean section; Appendectomy (2009); turbinectomy (2009); ganglionic cyst (remote); tendon release surgery (Left); Colonoscopy; Tonsillectomy; Anterior cervical decomp/discectomy fusion (N/A, 10/21/2012); bi-ventricular implantable cardioverter defibrillator (N/A, 06/15/2014); blood clot removed from left top hand; Thoracotomy (Left, 11/30/2014); Epicardial pacing lead placement (N/A, 11/30/2014); Cardiac catheterization (01/13/05/2015); Cardiac catheterization; Esophagogastroduodenoscopy (egd) with propofol (N/A, 04/07/2017); Breast biopsy (Left, 12/17/2018); and Breast biopsy (Left, 12/17/2018).   Her family history includes Breast cancer (age of onset: 52) in her maternal grandmother; Cancer in her paternal grandfather; Cancer (age of onset: 1) in her mother; Cancer (age of onset: 3) in her maternal grandmother; Diabetes in her maternal grandmother; Pneumonia in her father; Supraventricular tachycardia in her daughter.She reports that  she has never smoked. She has never used smokeless tobacco. She reports current alcohol use. She reports that she does not use drugs.  Outpatient Medications Prior to Visit  Medication Sig Dispense Refill  . albuterol (PROVENTIL) (2.5 MG/3ML) 0.083% nebulizer solution INHALE 3 MILLILITERS (1 VIAL) BY NEBULIZER EVERY 6 HOURS AS NEEDED FOR WHEEZING OR SHORTNESS OF BREATH 150 mL 1  . albuterol (VENTOLIN HFA) 108 (90 Base) MCG/ACT inhaler INHALE 2 PUFFS EVERY 4 HOURS AS NEEDED FOR WHEEZING 18 g 3  . ALPRAZolam (XANAX) 0.25 MG tablet Take 1 tablet (0.25 mg total) by mouth 2 (two) times daily as needed for anxiety. 20 tablet 0  . ARNUITY ELLIPTA 100 MCG/ACT AEPB INHALE 1 PUFF INTO THE LUNGS ONCE DAILY 30 each 4  . Ascorbic Acid (VITAMIN C) 1000 MG tablet Take 1,000 mg by mouth daily.    . baclofen (LIORESAL) 10 MG tablet Take 10 mg by mouth 2 (two) times daily.   0  . cetirizine (ZYRTEC) 10 MG chewable tablet Chew 10 mg by mouth daily.    . cholecalciferol (VITAMIN D3) 25 MCG (1000 UNIT) tablet Take 1,000 Units by mouth daily.    . cyanocobalamin (,VITAMIN B-12,) 1000 MCG/ML injection INJECT 1 ML INTO THE MUSCLE ONCE A WEEK 4 mL 3  . DULoxetine (CYMBALTA) 60 MG capsule TAKE (1) CAPSULE BY MOUTH EVERY DAY 90 capsule 1  . EMGALITY 120 MG/ML SOAJ     . fluticasone (FLONASE) 50 MCG/ACT nasal spray USE 2 SPRAYS INTO BOTH NOSTRILS ONCE DAILY AS DIRECTED BY PHYSICIAN. 48 g 2  . furosemide (LASIX) 20 MG tablet Take 1 tablet (20 mg total) by mouth daily. 30 tablet 5  . gabapentin (NEURONTIN) 100 MG capsule  TAKE ONE (1) CAPSULE THREE (3) TIMES EACH DAY (Patient taking differently: As needed) 270 capsule 1  . ketorolac (TORADOL) 10 MG tablet Take 1 tablet (10 mg total) by mouth every 8 (eight) hours as needed. 21 tablet 0  . magnesium gluconate (MAGONATE) 500 MG tablet Take 500 mg by mouth daily.     . montelukast (SINGULAIR) 10 MG tablet TAKE (1) TABLET BY MOUTH EVERY DAY 90 tablet 3  . Multiple  Vitamins-Minerals (MULTIVITAMIN ADULTS PO) Take by mouth.    . pantoprazole (PROTONIX) 40 MG tablet TAKE ONE (1) TABLET BY MOUTH ONCE DAILY 30 tablet 3  . potassium chloride (KLOR-CON) 10 MEQ tablet Take 1 tablet (10 mEq total) by mouth daily. 90 tablet 3  . sacubitril-valsartan (ENTRESTO) 24-26 MG Take 1 tablet by mouth 2 (two) times daily. 180 tablet 3  . traMADol (ULTRAM) 50 MG tablet TAKE (1) TABLET BY MOUTH EVERY 6 HOURS AS NEEDED 60 tablet 5  . traZODone (DESYREL) 50 MG tablet TAKE 1/2 TO 1 TABLET BY MOUTH AT BEDTIMEAS NEEDED FOR SLEEP 90 tablet 1  . TROKENDI XR 100 MG CP24 100 mg daily.   4  . UBRELVY 50 MG TABS as needed.     . valACYclovir (VALTREX) 1000 MG tablet Take 1 tablet (1,000 mg total) by mouth 2 (two) times daily as needed (fever blisters). 20 tablet 3  . citalopram (CELEXA) 20 MG tablet TAKE (1) TABLET BY MOUTH EVERY DAY 90 tablet 1  . losartan (COZAAR) 25 MG tablet Take 25 mg by mouth daily.    . baclofen (LIORESAL) 10 MG tablet Take 10 mg by mouth 2 (two) times daily as needed for muscle spasms. (Patient not taking: Reported on 11/10/2019)    . gabapentin (NEURONTIN) 100 MG capsule Take 100 mg by mouth 3 (three) times daily as needed. (Patient not taking: Reported on 11/10/2019)     No facility-administered medications prior to visit.    Review of Systems  Objective:  BP 94/60 (BP Location: Left Arm, Patient Position: Sitting, Cuff Size: Normal)   Pulse 80   Temp 98.7 F (37.1 C) (Oral)   Resp 15   Ht 5\' 5"  (1.651 m)   Wt 148 lb 12.8 oz (67.5 kg)   LMP 07/12/2016   SpO2 99%   BMI 24.76 kg/m   Physical Exam    Assessment & Plan:   Problem List Items Addressed This Visit      Unprioritized   Fatigue    Chronic multifactorial (cardiomyopathy, pain and depression).  Improved with working from home. Sleep study done to rule out OSA in March 2019 noted hypersomnia and lack of REM sleep , but no OSA . celexa implicated as cause of lack of REM sleep . Medication  changes today       Relevant Orders   TSH (Completed)   Cardiomyopathy, dilated, nonischemic (HCC)    Nonischemic, nonvalvular, With global hypokinesis,  EF 45% by June ECHO.  S/p biventricular pacer and AICD in 2016 for EF 25%  Completed cardiac rehab.  Tolerating Entresto,  But having hypotension and fatigue with daily lasix 20 mg.  Will reduce diuretic to 10 mg alternating with 20 mg and have her monitor weight daily for fluid retention  .      Relevant Orders   Lipid panel (Completed)   Dyspareunia, female    Secondary to atrophic vaginitis based on exam and PAP smear done today .  Vaginal estrogen discussed;  Use postponed until mammogram results  are known.       Statin intolerance    Her 10 yr risk using the FRC is < 5% and she had normal coronaries by 2015 cardiac catheterization.   Given her statin intolerance,  There is no compelling reason to start PCSK9 inhibitor therapy.  Lab Results  Component Value Date   CHOL 258 (H) 11/10/2019   HDL 56.70 11/10/2019   LDLCALC 185 (H) 11/10/2019   LDLDIRECT 151.6 05/01/2011   TRIG 83.0 11/10/2019   CHOLHDL 5 11/10/2019         Depression with anxiety    Aggravated by grief at loss of several dear friends , frustration regarding her own health problems.  Change in therapy from citalopram to Effexor with cross taper.  Follow up 3 weeks       Relevant Medications   venlafaxine XR (EFFEXOR XR) 37.5 MG 24 hr capsule   Breast calcifications on mammogram    Her 6 month follow up diagnostic mammogram is scheduled for Oct 1       History of 2019 novel coronavirus disease (COVID-19)    DIAGNOSED January 2021      Relevant Orders   SARS-CoV-2 Semi-Quantitative Total Antibody, Spike   Myalgia due to statin   Encounter for preventive health examination - Primary    age appropriate education and counseling updated, referrals for preventative services and immunizations addressed, dietary and smoking counseling addressed, most recent  labs reviewed.  I have personally reviewed and have noted:  1) the patient's medical and social history 2) The pt's use of alcohol, tobacco, and illicit drugs 3) The patient's current medications and supplements 4) Functional ability including ADL's, fall risk, home safety risk, hearing and visual impairment 5) Diet and physical activities 6) Evidence for depression or mood disorder 7) The patient's height, weight, and BMI have been recorded in the chart  I have made referrals, and provided counseling and education based on review of the above.  PAP smear and breast exam were done .  She is up to date on colon ca screening       Other Visit Diagnoses    Cervical cancer screening       Relevant Orders   Cytology - PAP( [place]) (Completed)   Need for immunization against influenza       Relevant Orders   Flu Vaccine QUAD 36+ mos IM (Completed)      I have discontinued Isidoro Donning. Nuccio "Keane"'s citalopram and losartan. I am also having her start on venlafaxine XR. Additionally, I am having her maintain her valACYclovir, Trokendi XR, baclofen, cetirizine, magnesium gluconate, albuterol, Emgality, Ubrelvy, traMADol, gabapentin, ALPRAZolam, DULoxetine, montelukast, Multiple Vitamins-Minerals (MULTIVITAMIN ADULTS PO), vitamin C, cholecalciferol, ketorolac, Arnuity Ellipta, cyanocobalamin, traZODone, Entresto, potassium chloride, pantoprazole, albuterol, furosemide, and fluticasone.  Meds ordered this encounter  Medications  . venlafaxine XR (EFFEXOR XR) 37.5 MG 24 hr capsule    Sig: Take 1 capsule (37.5 mg total) by mouth daily with breakfast.    Dispense:  90 capsule    Refill:  0    Medications Discontinued During This Encounter  Medication Reason  . baclofen (LIORESAL) 10 MG tablet Duplicate  . gabapentin (NEURONTIN) 100 MG capsule   . losartan (COZAAR) 25 MG tablet   . citalopram (CELEXA) 20 MG tablet     Follow-up: No follow-ups on file.   [name], MD

## 2019-11-11 ENCOUNTER — Telehealth: Payer: Self-pay | Admitting: Internal Medicine

## 2019-11-11 LAB — CYTOLOGY - PAP
Comment: NEGATIVE
Diagnosis: NEGATIVE
High risk HPV: NEGATIVE

## 2019-11-11 NOTE — Telephone Encounter (Signed)
Pt states that Dr Derrel Nip told her to taper off of lexapro but she states that she isnt on lexapro. Pt needs clarification before the weekend. Pt states that she is taking celexa and cymbalta and wants to know if she meant one of those instead? Please advise

## 2019-11-12 DIAGNOSIS — Z0000 Encounter for general adult medical examination without abnormal findings: Secondary | ICD-10-CM | POA: Insufficient documentation

## 2019-11-12 DIAGNOSIS — T466X5A Adverse effect of antihyperlipidemic and antiarteriosclerotic drugs, initial encounter: Secondary | ICD-10-CM | POA: Insufficient documentation

## 2019-11-12 NOTE — Assessment & Plan Note (Addendum)
Chronic multifactorial (cardiomyopathy, pain and depression).  Improved with working from home. Sleep study done to rule out OSA in March 2019 noted hypersomnia and lack of REM sleep , but no OSA . celexa implicated as cause of lack of REM sleep . Medication changes today

## 2019-11-12 NOTE — Assessment & Plan Note (Signed)
Secondary to atrophic vaginitis based on exam and PAP smear done today .  Vaginal estrogen discussed;  Use postponed until mammogram results are known.

## 2019-11-12 NOTE — Assessment & Plan Note (Addendum)
Nonischemic, nonvalvular, With global hypokinesis,  EF 45% by June ECHO.  S/p biventricular pacer and AICD in 2016 for EF 25%  Completed cardiac rehab.  Tolerating Entresto,  But having hypotension and fatigue with daily lasix 20 mg.  Will reduce diuretic to 10 mg alternating with 20 mg and have her monitor weight daily for fluid retention  .

## 2019-11-12 NOTE — Assessment & Plan Note (Signed)
Results of cardiac catheterization

## 2019-11-12 NOTE — Assessment & Plan Note (Signed)
age appropriate education and counseling updated, referrals for preventative services and immunizations addressed, dietary and smoking counseling addressed, most recent labs reviewed.  I have personally reviewed and have noted:  1) the patient's medical and social history 2) The pt's use of alcohol, tobacco, and illicit drugs 3) The patient's current medications and supplements 4) Functional ability including ADL's, fall risk, home safety risk, hearing and visual impairment 5) Diet and physical activities 6) Evidence for depression or mood disorder 7) The patient's height, weight, and BMI have been recorded in the chart  I have made referrals, and provided counseling and education based on review of the above.  PAP smear and breast exam were done .  She is up to date on colon ca screening

## 2019-11-12 NOTE — Assessment & Plan Note (Signed)
Aggravated by grief at loss of several dear friends , frustration regarding her own health problems.  Change in therapy from citalopram to Effexor with cross taper.  Follow up 3 weeks

## 2019-11-12 NOTE — Assessment & Plan Note (Signed)
Her 10 yr risk using the FRC is < 5% and she had normal coronaries by 2015 cardiac catheterization.   Given her statin intolerance,  There is no compelling reason to start PCSK9 inhibitor therapy.  Lab Results  Component Value Date   CHOL 258 (H) 11/10/2019   HDL 56.70 11/10/2019   LDLCALC 185 (H) 11/10/2019   LDLDIRECT 151.6 05/01/2011   TRIG 83.0 11/10/2019   CHOLHDL 5 11/10/2019

## 2019-11-12 NOTE — Assessment & Plan Note (Signed)
Her 6 month follow up diagnostic mammogram is scheduled for Oct 1

## 2019-11-14 ENCOUNTER — Other Ambulatory Visit: Payer: Self-pay

## 2019-11-14 ENCOUNTER — Ambulatory Visit (INDEPENDENT_AMBULATORY_CARE_PROVIDER_SITE_OTHER): Payer: BC Managed Care – PPO

## 2019-11-14 ENCOUNTER — Ambulatory Visit
Admission: EM | Admit: 2019-11-14 | Discharge: 2019-11-14 | Disposition: A | Discharge status: Home / Self Care | Payer: BC Managed Care – PPO | Attending: Emergency Medicine | Admitting: Emergency Medicine

## 2019-11-14 DIAGNOSIS — R0602 Shortness of breath: Secondary | ICD-10-CM | POA: Diagnosis not present

## 2019-11-14 DIAGNOSIS — I509 Heart failure, unspecified: Secondary | ICD-10-CM | POA: Diagnosis not present

## 2019-11-14 DIAGNOSIS — I420 Dilated cardiomyopathy: Secondary | ICD-10-CM | POA: Diagnosis not present

## 2019-11-14 DIAGNOSIS — N182 Chronic kidney disease, stage 2 (mild): Secondary | ICD-10-CM | POA: Diagnosis not present

## 2019-11-14 DIAGNOSIS — Z95810 Presence of automatic (implantable) cardiac defibrillator: Secondary | ICD-10-CM

## 2019-11-14 DIAGNOSIS — Z791 Long term (current) use of non-steroidal anti-inflammatories (NSAID): Secondary | ICD-10-CM | POA: Insufficient documentation

## 2019-11-14 DIAGNOSIS — R05 Cough: Secondary | ICD-10-CM | POA: Diagnosis not present

## 2019-11-14 DIAGNOSIS — E538 Deficiency of other specified B group vitamins: Secondary | ICD-10-CM | POA: Diagnosis not present

## 2019-11-14 DIAGNOSIS — I447 Left bundle-branch block, unspecified: Secondary | ICD-10-CM | POA: Diagnosis not present

## 2019-11-14 DIAGNOSIS — K219 Gastro-esophageal reflux disease without esophagitis: Secondary | ICD-10-CM | POA: Insufficient documentation

## 2019-11-14 DIAGNOSIS — J069 Acute upper respiratory infection, unspecified: Secondary | ICD-10-CM | POA: Diagnosis present

## 2019-11-14 DIAGNOSIS — Z981 Arthrodesis status: Secondary | ICD-10-CM | POA: Diagnosis not present

## 2019-11-14 DIAGNOSIS — Z20822 Contact with and (suspected) exposure to covid-19: Secondary | ICD-10-CM | POA: Insufficient documentation

## 2019-11-14 DIAGNOSIS — G43909 Migraine, unspecified, not intractable, without status migrainosus: Secondary | ICD-10-CM | POA: Insufficient documentation

## 2019-11-14 DIAGNOSIS — Z79899 Other long term (current) drug therapy: Secondary | ICD-10-CM | POA: Diagnosis not present

## 2019-11-14 DIAGNOSIS — I5032 Chronic diastolic (congestive) heart failure: Secondary | ICD-10-CM | POA: Diagnosis not present
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
## 2019-11-14 NOTE — ED Triage Notes (Signed)
C/o cough, congestion, and shortness of breath x3 days. Reports pain when coughing. States that the cough is intermittently productive.

## 2019-11-14 NOTE — Discharge Instructions (Addendum)
Quarantine until the results of your COVID test is back.  There is no sign of pneumonia or fluid accumulation on your chest x-ray.  Continue your current medication regimen, use OTC Tylenol as needed for fever and aches, use honey-based cough preparations for your cough.  Return for worsening symptoms or follow-up with your PCP.

## 2019-11-15 LAB — SARS-COV-2 SEMI-QUANTITATIVE TOTAL ANTIBODY, SPIKE: SARS COV2 AB, Total Spike Semi QN: >2500 U/mL — ABNORMAL HIGH (ref <0.8)

## 2019-11-15 LAB — SARS CORONAVIRUS 2 (TAT 6-24 HRS): SARS Coronavirus 2: NEGATIVE

## 2019-11-16 NOTE — Progress Notes (Signed)
EPIC Encounter for ICM Monitoring  Patient Name: Suzanne Spears is a 59 y.o. female Date: 11/16/2019 Primary Care Physican: [name], MD Primary Cardiologist: Rockey Situ Electrophysiologist: Vergie Living Pacing: 98.5% 8/23/2021Weight:151lbs     Spoke with patient and reports she has respiratory infection  OptiVolThoracic impedancesuggesting possible fluid accumulation since 11/12/2019.  Prescribed:  Furosemide 20 mgtake2tablets(40 mg total)every other day alternating with 1 tablet (20 mg total) every other day.  Potassium 10 mEqtake1 tablet daily  Labs: 11/04/2019 Creatinine1.26, BUN21, Potassium3.8, Sodium139, GFR 47-54 A complete set of results can be found in Results Review.  Recommendations: She self adjusted Furosemide and took extra this morning..   Follow-up plan: ICM clinic phone appointment on 11/21/2019 to fluid levels. 91 day device clinic remote transmission 12/02/2019.   EP/Cardiology Office Visits:11/28/2019 with Dr.Gollan.   Copy of ICM check sent to [place]   3 month ICM trend: 11/14/2019    1 Year ICM trend:       [name], RN 11/16/2019 3:06 PM

## 2019-11-21 ENCOUNTER — Ambulatory Visit (INDEPENDENT_AMBULATORY_CARE_PROVIDER_SITE_OTHER): Payer: BC Managed Care – PPO

## 2019-11-21 DIAGNOSIS — Z95810 Presence of automatic (implantable) cardiac defibrillator: Secondary | ICD-10-CM

## 2019-11-21 DIAGNOSIS — I5032 Chronic diastolic (congestive) heart failure: Secondary | ICD-10-CM

## 2019-11-21 NOTE — Progress Notes (Signed)
EPIC Encounter for ICM Monitoring  Patient Name: Suzanne Spears is a 59 y.o. female Date: 11/21/2019 Primary Care Physican: [name], MD Primary Cardiologist: Rockey Situ Electrophysiologist: Vergie Living Pacing: 98.6% 9/27/2021Weight:148.5lbs     Spoke with patient and reports she ate restaurant foods for a couple of days since 9/24 which are normally higher in salt.  OptiVolThoracic impedance returned to normal 9/23 but then suggesting possible fluid accumulation occurred again starting 11/18/2019.  Prescribed:  Furosemide 20 mgtake1tablet(20 mg total).  Potassium 10 mEqtake1 tablet daily  Labs: 11/04/2019 Creatinine1.26, BUN21, Potassium3.8, Sodium139, GFR 47-54 A complete set of results can be found in Results Review.  Recommendations: Recommendation to limit salt intake to 2000 mg daily and fluid intake to 64 oz daily.  Encouraged to call if experiencing any fluid symptoms.   Follow-up plan: ICM clinic phone appointment on10/09/2019 tofluid levels. 91 day device clinic remote transmission 12/02/2019.   EP/Cardiology Office Visits:11/28/2019 with Dr.Gollan.   Copy of ICM check sent to [place]    3 month ICM trend: 11/21/2019    1 Year ICM trend:       [name], RN 11/21/2019 10:38 AM

## 2019-11-22 ENCOUNTER — Other Ambulatory Visit: Payer: Self-pay | Admitting: Internal Medicine

## 2019-11-25 ENCOUNTER — Other Ambulatory Visit: Payer: Self-pay

## 2019-11-25 ENCOUNTER — Ambulatory Visit
Admission: RE | Admit: 2019-11-25 | Discharge: 2019-11-25 | Disposition: A | Discharge status: Home / Self Care | Payer: BC Managed Care – PPO | Source: Ambulatory Visit | Attending: Internal Medicine | Admitting: Internal Medicine

## 2019-11-25 DIAGNOSIS — R928 Other abnormal and inconclusive findings on diagnostic imaging of breast: Secondary | ICD-10-CM | POA: Diagnosis not present

## 2019-11-25 DIAGNOSIS — R921 Mammographic calcification found on diagnostic imaging of breast: Secondary | ICD-10-CM
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
## 2019-11-28 ENCOUNTER — Other Ambulatory Visit: Payer: Self-pay

## 2019-11-28 ENCOUNTER — Encounter: Payer: Self-pay | Admitting: Cardiovascular Disease

## 2019-11-28 ENCOUNTER — Ambulatory Visit (INDEPENDENT_AMBULATORY_CARE_PROVIDER_SITE_OTHER): Payer: BC Managed Care – PPO | Admitting: Cardiovascular Disease

## 2019-11-28 VITALS — BP 102/60 | HR 77 | Ht 65.5 in | Wt 148.2 lb

## 2019-11-28 DIAGNOSIS — R000 Tachycardia, unspecified: Secondary | ICD-10-CM | POA: Diagnosis not present

## 2019-11-28 DIAGNOSIS — I5032 Chronic diastolic (congestive) heart failure: Secondary | ICD-10-CM

## 2019-11-28 DIAGNOSIS — Z95810 Presence of automatic (implantable) cardiac defibrillator: Secondary | ICD-10-CM | POA: Diagnosis not present

## 2019-11-28 DIAGNOSIS — I10 Essential (primary) hypertension: Secondary | ICD-10-CM

## 2019-11-28 DIAGNOSIS — U071 COVID-19: Secondary | ICD-10-CM

## 2019-11-28 DIAGNOSIS — I428 Other cardiomyopathies: Secondary | ICD-10-CM | POA: Diagnosis not present

## 2019-11-28 DIAGNOSIS — I5042 Chronic combined systolic (congestive) and diastolic (congestive) heart failure: Secondary | ICD-10-CM

## 2019-11-28 NOTE — Patient Instructions (Addendum)
We will get the cath reports for the system  Medication Instructions:  Please cut the entresto in 1/2 at night Monitor blood pressure If pressure continues to run low, we may need 1/2 pill twice a day  Research zetia   If you need a refill on your cardiac medications before your next appointment, please call your pharmacy.    Lab work: No new labs needed   If you have labs (blood work) drawn today and your tests are completely normal, you will receive your results only by: Marland Kitchen MyChart Message (if you have MyChart) OR . A paper copy in the mail If you have any lab test that is abnormal or we need to change your treatment, we will call you to review the results.   Testing/Procedures: No new testing needed   Follow-Up: At [facility], you and your health needs are our priority.  As part of our continuing mission to provide you with exceptional heart care, we have created designated Provider Care Teams.  These Care Teams include your primary Cardiologist (physician) and Advanced Practice Providers (APPs -  Physician Assistants and Nurse Practitioners) who all work together to provide you with the care you need, when you need it.  . You will need a follow up appointment in 6 months  . Providers on your designated Care Team:   . Murray Hodgkins, NP . Christell Faith, PA-C . Marrianne Mood, PA-C  Any Other Special Instructions Will Be Listed Below (If Applicable).  COVID-19 Vaccine Information can be found at: ShippingScam.co.uk For questions related to vaccine distribution or appointments, please email [email or web address] or call [phone].

## 2019-12-02 ENCOUNTER — Ambulatory Visit (INDEPENDENT_AMBULATORY_CARE_PROVIDER_SITE_OTHER): Payer: BC Managed Care – PPO

## 2019-12-02 DIAGNOSIS — Z95810 Presence of automatic (implantable) cardiac defibrillator: Secondary | ICD-10-CM

## 2019-12-02 DIAGNOSIS — I5032 Chronic diastolic (congestive) heart failure: Secondary | ICD-10-CM

## 2019-12-02 DIAGNOSIS — I428 Other cardiomyopathies: Secondary | ICD-10-CM | POA: Diagnosis not present

## 2019-12-02 LAB — CUP PACEART REMOTE DEVICE CHECK
Battery Remaining Longevity: 16 mo
Battery Voltage: 2.9 V
Brady Statistic AP VP Percent: 0.02 %
Brady Statistic AP VS Percent: 0.01 %
Brady Statistic AS VP Percent: 98.62 %
Brady Statistic AS VS Percent: 1.35 %
Brady Statistic RA Percent Paced: 0.03 %
Brady Statistic RV Percent Paced: 1.54 %
Date Time Interrogation Session: 20211008043723
HighPow Impedance: 80 Ohm
Implantable Lead Implant Date: 20160421
Implantable Lead Implant Date: 20160421
Implantable Lead Implant Date: 20161006
Implantable Lead Implant Date: 20161006
Implantable Lead Location: 753858
Implantable Lead Location: 753858
Implantable Lead Location: 753859
Implantable Lead Location: 753860
Implantable Lead Model: 5071
Implantable Lead Model: 5071
Implantable Lead Model: 5076
Implantable Pulse Generator Implant Date: 20160421
Lead Channel Impedance Value: 285 Ohm
Lead Channel Impedance Value: 285 Ohm
Lead Channel Impedance Value: 380 Ohm
Lead Channel Impedance Value: 380 Ohm
Lead Channel Impedance Value: 4047 Ohm
Lead Channel Impedance Value: 4047 Ohm
Lead Channel Pacing Threshold Amplitude: 0.5 V
Lead Channel Pacing Threshold Amplitude: 1.125 V
Lead Channel Pacing Threshold Pulse Width: 0.4 ms
Lead Channel Pacing Threshold Pulse Width: 0.4 ms
Lead Channel Sensing Intrinsic Amplitude: 2.875 mV
Lead Channel Sensing Intrinsic Amplitude: 2.875 mV
Lead Channel Sensing Intrinsic Amplitude: 23.625 mV
Lead Channel Sensing Intrinsic Amplitude: 23.625 mV
Lead Channel Setting Pacing Amplitude: 1.5 V
Lead Channel Setting Pacing Amplitude: 2 V
Lead Channel Setting Pacing Amplitude: 2.5 V
Lead Channel Setting Pacing Pulse Width: 0.4 ms
Lead Channel Setting Pacing Pulse Width: 0.4 ms
Lead Channel Setting Sensing Sensitivity: 0.3 mV

## 2019-12-02 NOTE — Progress Notes (Signed)
EPIC Encounter for ICM Monitoring  Patient Name: Suzanne Spears is a 59 y.o. female Date: 12/02/2019 Primary Care Physican: [name], MD Primary Cardiologist: Rockey Situ Electrophysiologist: Vergie Living Pacing: 98.6% 9/27/2021Weight:148.5lbs 12/02/2019 Weight: 150 lbs     Spoke with patient.  She said her and Dr Rockey Situ discussed taking Furosemide prn instead of daily.  She said she had not taken any Lasix for 2 days but since weight was up this morning she took 2.  Her hands were swollen this AM also.    OptiVolThoracic impedance suggesting possible fluid accumulation starting10/05/2019.  Prescribed:  Furosemide 20 mgtake1tablet(20 mg total).  Potassium 10 mEqtake1 tablet daily  Labs: 09/10/2021Creatinine1.26, BUN21, Potassium3.8, Sodium139, [id number] A complete set of results can be found in Results Review.  Recommendations:  No changes and encouraged to call if experiencing any fluid symptoms.  Follow-up plan: ICM clinic phone appointment on10/25/2021. 91 day device clinic remote transmission due 03/02/2020.   EP/Cardiology Office Visits: Recall 05/26/2020 with Dr.Gollan.  Recall for 10/24/2020 with Dr Caryl Comes   Copy of ICM check sent to [facility].   3 month ICM trend: 12/02/2019    1 Year ICM trend:       [name], RN 12/02/2019 7:51 AM

## 2019-12-05 NOTE — Progress Notes (Signed)
Remote ICD transmission.   

## 2019-12-19 ENCOUNTER — Ambulatory Visit (INDEPENDENT_AMBULATORY_CARE_PROVIDER_SITE_OTHER): Payer: BC Managed Care – PPO

## 2019-12-19 DIAGNOSIS — Z95810 Presence of automatic (implantable) cardiac defibrillator: Secondary | ICD-10-CM | POA: Diagnosis not present

## 2019-12-19 DIAGNOSIS — I5032 Chronic diastolic (congestive) heart failure: Secondary | ICD-10-CM

## 2019-12-23 ENCOUNTER — Telehealth: Payer: Self-pay

## 2019-12-23 NOTE — Progress Notes (Signed)
EPIC Encounter for ICM Monitoring  Patient Name: Suzanne Spears is a 59 y.o. female Date: 12/23/2019 Primary Care Physican: [name], MD Primary Cardiologist: Rockey Situ Electrophysiologist: Vergie Living Pacing: 98.5% 9/27/2021Weight:148.5lbs 12/02/2019 Weight: 150 lbs     Attempted call to patient and unable to reach.  Left detailed message per DPR regarding transmission. Transmission reviewed.   OptiVolThoracic impedanceshowing possible fluid accumulation 2-3 days then returns to baseline.   Prescribed:  Furosemide 20 mgtake1tablet(20 mg total). Pt takes differently: taking PRN  Potassium 10 mEqtake1 tablet daily  Labs: 09/10/2021Creatinine1.26, BUN21, Potassium3.8, Sodium139, [id number] A complete set of results can be found in Results Review.  Recommendations:  Left voice mail with ICM number and encouraged to call if experiencing any fluid symptoms.  Follow-up plan: ICM clinic phone appointment on11/29/2021. 91 day device clinic remote transmission due 03/02/2020.   EP/Cardiology Office Visits: Recall 05/26/2020 with Dr.Gollan.  Recall for 10/24/2020 with Dr Caryl Comes   Copy of ICM check sent to [place].   3 month ICM trend: 12/19/2019    1 Year ICM trend:       [name], RN 12/23/2019 9:20 AM

## 2019-12-23 NOTE — Telephone Encounter (Signed)
Remote ICM transmission received.  Attempted call to patient regarding ICM remote transmission and left detailed message per DPR.  Advised to return call for any fluid symptoms or questions. Next ICM remote transmission scheduled 01/23/2020.

## 2019-12-27 ENCOUNTER — Other Ambulatory Visit: Payer: Self-pay | Admitting: Internal Medicine

## 2020-01-02 ENCOUNTER — Telehealth: Payer: Self-pay

## 2020-01-02 NOTE — Telephone Encounter (Signed)
Spoke with pt, reviewed transmission received.  There was nothing on transmission to reflect the sounds she heard.  Educated pt on sounds she might hear, both EMI noise and alert sounds.  The sound that patient heard was not like either sound.  Assured pt the sound was not due to an issue with her ICD, it is working properly.    Noted elevated optivol, pt reports she is taking her lasix as prescribed,  She has had some indiscretions with diet.  Pt enrolled in ICM clinic.  Advised I will have Margarita Grizzle, RN from [facility] call her.

## 2020-01-02 NOTE — Telephone Encounter (Signed)
The pt states her ICD was making a noise.  I asked her did it sound like a french siren and she states no. I asked was she near any magnets and states she did not think so. I told her to send a transmission for the nurse can review. Transmission received. I let her speak with Amy, rn.

## 2020-01-03 NOTE — Telephone Encounter (Signed)
Spoke with patient.  She reports she is taking PRN Furosemide but is having some dizziness at times during the day.  She is eating out more and advised restaurant foods are much higher in salt.  Advised to limit salt to 2000 mg daily and fluid to 64 oz daily.  She has been advised by Dr Garen Lah to take Furosemide daily and if she gains 3 lbs overnight or 5 pounds in a week then to take 2 tablets of Furosemide daily but she sometimes has cramps.  Advised to take extra Potassium if needed with extra Furosemide.  She is feeling fine now.

## 2020-01-04 NOTE — Telephone Encounter (Signed)
appt has been scheduled and pt is aware.

## 2020-01-11 ENCOUNTER — Telehealth (INDEPENDENT_AMBULATORY_CARE_PROVIDER_SITE_OTHER): Payer: BC Managed Care – PPO | Admitting: Internal Medicine

## 2020-01-11 ENCOUNTER — Encounter: Payer: Self-pay | Admitting: Internal Medicine

## 2020-01-11 DIAGNOSIS — F418 Other specified anxiety disorders: Secondary | ICD-10-CM

## 2020-01-11 MED ORDER — VENLAFAXINE HCL ER 150 MG PO CP24
150.0000 mg | ORAL_CAPSULE | Freq: Every day | ORAL | 0 refills | Status: DC
Start: 2020-01-11 — End: 2020-04-25

## 2020-01-11 MED ORDER — VALACYCLOVIR HCL 1 G PO TABS
1000.0000 mg | ORAL_TABLET | Freq: Two times a day (BID) | ORAL | 3 refills | Status: DC | PRN
Start: 2020-01-11 — End: 2022-03-10

## 2020-01-11 MED ORDER — BACLOFEN 10 MG PO TABS: 10.0000 mg | ORAL_TABLET | Freq: Two times a day (BID) | ORAL | 1 refills | Status: DC

## 2020-01-11 MED ORDER — GABAPENTIN 100 MG PO CAPS
ORAL_CAPSULE | ORAL | 1 refills | Status: DC
Start: 2020-01-11 — End: 2021-02-08

## 2020-01-11 MED ORDER — BACLOFEN 10 MG PO TABS
10.0000 mg | ORAL_TABLET | Freq: Two times a day (BID) | ORAL | 0 refills | Status: DC
Start: 2020-01-11 — End: 2020-02-06
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
## 2020-01-12 NOTE — Assessment & Plan Note (Signed)
Advised to increase effexor dose from 75 to 150 mg daily. Follow up 2 weeks

## 2020-01-20 ENCOUNTER — Other Ambulatory Visit: Payer: Self-pay | Admitting: Internal Medicine

## 2020-01-23 ENCOUNTER — Ambulatory Visit (INDEPENDENT_AMBULATORY_CARE_PROVIDER_SITE_OTHER): Payer: BC Managed Care – PPO

## 2020-01-23 DIAGNOSIS — Z95810 Presence of automatic (implantable) cardiac defibrillator: Secondary | ICD-10-CM | POA: Diagnosis not present

## 2020-01-23 DIAGNOSIS — I5032 Chronic diastolic (congestive) heart failure: Secondary | ICD-10-CM

## 2020-01-25 NOTE — Progress Notes (Signed)
EPIC Encounter for ICM Monitoring  Patient Name: Suzanne Spears is a 59 y.o. female Date: 01/25/2020 Primary Care Physican: [name], MD Primary Cardiologist: Rockey Situ Electrophysiologist: Vergie Living Pacing: 98.6% 01/24/2020 Weight: 149 lbs     Spoke with patient and reports feeling well at this time.  Denies fluid symptoms.     OptiVolThoracic impedancenormal but was suggesting possible fluid accumulation from 12/23/2019 - 01/11/2020.   Prescribed:  Furosemide 20 mgtake1tablet(20 mg total). Pt takes differently: taking PRN  Potassium 10 mEqtake1 tablet daily  Labs: 09/10/2021Creatinine1.26, BUN21, Potassium3.8, Sodium139, [id number] A complete set of results can be found in Results Review.  Recommendations:  No changes and encouraged to call if experiencing any fluid symptoms.  Follow-up plan: ICM clinic phone appointment on1/04/2020. 91 day device clinic remote transmissiondue 03/02/2020.   EP/Cardiology Office Visits: Recall 4/2/2022with Dr.Gollan.Recall for 10/24/2020 with Dr Caryl Comes  Copy of ICM check sent to [place].    3 month ICM trend: 01/25/2020    1 Year ICM trend:       [name], RN 01/25/2020 11:52 AM

## 2020-01-30 ENCOUNTER — Telehealth: Payer: Self-pay | Admitting: Cardiovascular Disease

## 2020-01-30 DIAGNOSIS — Z0279 Encounter for issue of other medical certificate: Secondary | ICD-10-CM

## 2020-01-30 NOTE — Telephone Encounter (Signed)
Patient dropped off FMLA forms to be completed  Filled out forms and paid $29 with check  Placed forms in Dr Rockey Situ nurse box  Would like forms to be mailed when complete

## 2020-01-30 NOTE — Telephone Encounter (Signed)
Paperwork in process, RN has filled in much as possible related to pt's current heart condition, waiting on Dr. Rockey Situ to review and sign. FMLA paperwork placed on his desk

## 2020-02-06 ENCOUNTER — Ambulatory Visit (INDEPENDENT_AMBULATORY_CARE_PROVIDER_SITE_OTHER): Payer: BC Managed Care – PPO

## 2020-02-06 ENCOUNTER — Ambulatory Visit
Admission: RE | Admit: 2020-02-06 | Discharge: 2020-02-06 | Disposition: A | Discharge status: Home / Self Care | Payer: BC Managed Care – PPO | Source: Ambulatory Visit | Attending: Family Medicine | Admitting: Family Medicine

## 2020-02-06 ENCOUNTER — Other Ambulatory Visit: Payer: Self-pay

## 2020-02-06 VITALS — BP 107/66 | HR 78 | Temp 98.1°F | Resp 18 | Ht 65.0 in | Wt 149.0 lb

## 2020-02-06 DIAGNOSIS — M25562 Pain in left knee: Secondary | ICD-10-CM

## 2020-02-06 DIAGNOSIS — M25522 Pain in left elbow: Secondary | ICD-10-CM

## 2020-02-06 DIAGNOSIS — M79662 Pain in left lower leg: Secondary | ICD-10-CM

## 2020-02-06 DIAGNOSIS — W1809XA Striking against other object with subsequent fall, initial encounter: Secondary | ICD-10-CM

## 2020-02-06 DIAGNOSIS — M25572 Pain in left ankle and joints of left foot: Secondary | ICD-10-CM

## 2020-02-06 MED ORDER — KETOROLAC TROMETHAMINE 10 MG PO TABS: 10.0000 mg | ORAL_TABLET | Freq: Four times a day (QID) | ORAL | 0 refills | Status: DC | PRN
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
## 2020-02-06 NOTE — ED Triage Notes (Signed)
Patient states she tripped and fell on concrete on Friday. She is c/o left ankle, shin, knee, hip, elbow pain. She states her left knee is bothering her the most.

## 2020-02-06 NOTE — Discharge Instructions (Addendum)
Xray's normal today - No fracture.  Rest, ice, elevation.  Take care  Dr. Lacinda Axon

## 2020-02-13 NOTE — Telephone Encounter (Signed)
Left pt a MyChart message regarding her FMLA paperwork. Placedon Dr. Rockey Situ desk 12/6, has seen package, although has not signed. Is in folder on desk.

## 2020-02-16 ENCOUNTER — Other Ambulatory Visit: Payer: Self-pay | Admitting: Internal Medicine

## 2020-02-23 NOTE — Telephone Encounter (Signed)
Left pt another MyChart message about her FMLA paperwork, has been seen by Dr. Mariah Milling, but he has not signed at this time, will reach back out to pt once the form is completed and signed

## 2020-02-27 ENCOUNTER — Ambulatory Visit (INDEPENDENT_AMBULATORY_CARE_PROVIDER_SITE_OTHER): Payer: BC Managed Care – PPO

## 2020-02-27 DIAGNOSIS — Z95810 Presence of automatic (implantable) cardiac defibrillator: Secondary | ICD-10-CM | POA: Diagnosis not present

## 2020-02-27 DIAGNOSIS — I5032 Chronic diastolic (congestive) heart failure: Secondary | ICD-10-CM

## 2020-02-27 NOTE — Progress Notes (Signed)
EPIC Encounter for ICM Monitoring  Patient Name: Suzanne Spears is a 60 y.o. female Date: 02/27/2020 Primary Care Physican: Sherlene Shams, MD Primary Cardiologist: Mariah Milling Electrophysiologist: Joycelyn Schmid Pacing: 98.5% 02/27/2020 Weight: 148 lbs      Spoke with patient and she reports weight gain of 3 lbs in the last week.  She has not been following low salt diet during the holidays.    OptiVolThoracic impedancesuggesting possible fluid accumulation since 02/20/2020.  Prescribed:  Furosemide 20 mgtake1tablet(20 mg total). Pt takes differently: taking PRN  Potassium 10 mEqtake1 tablet daily  Labs: 09/10/2021Creatinine1.26, BUN21, Potassium3.8, Sodium139, [id number] A complete set of results can be found in Results Review.  Recommendations:She is taking Lasix 1 tablet daily but has increased to 2 daily x 2 days.  Advised to limit salt intake and continue to take Lasix daily.  Follow-up plan: ICM clinic phone appointment on1/08/2020 to recheck fluid levels. 91 day device clinic remote transmissiondue 03/02/2020.   EP/Cardiology Office Visits: Recall 4/2/2022with Dr.Gollan.Recall for 10/24/2020 with Dr Graciela Husbands.  Copy of ICM check sent to Dr.Klein and Dr Mariah Milling.  3 month ICM trend: 02/27/2020    1 Year ICM trend:       Karie Soda, RN 02/27/2020 11:22 AM

## 2020-03-02 ENCOUNTER — Emergency Department: Payer: BC Managed Care – PPO

## 2020-03-02 ENCOUNTER — Ambulatory Visit (INDEPENDENT_AMBULATORY_CARE_PROVIDER_SITE_OTHER): Payer: BC Managed Care – PPO

## 2020-03-02 ENCOUNTER — Telehealth: Payer: Self-pay

## 2020-03-02 ENCOUNTER — Emergency Department
Admission: EM | Admit: 2020-03-02 | Discharge: 2020-03-02 | Disposition: A | Discharge status: Home / Self Care | Payer: BC Managed Care – PPO | Attending: Emergency Medicine | Admitting: Emergency Medicine

## 2020-03-02 ENCOUNTER — Other Ambulatory Visit: Payer: Self-pay

## 2020-03-02 DIAGNOSIS — N182 Chronic kidney disease, stage 2 (mild): Secondary | ICD-10-CM | POA: Diagnosis not present

## 2020-03-02 DIAGNOSIS — R4182 Altered mental status, unspecified: Secondary | ICD-10-CM | POA: Insufficient documentation

## 2020-03-02 DIAGNOSIS — J45909 Unspecified asthma, uncomplicated: Secondary | ICD-10-CM | POA: Diagnosis not present

## 2020-03-02 DIAGNOSIS — I420 Dilated cardiomyopathy: Secondary | ICD-10-CM

## 2020-03-02 DIAGNOSIS — M7918 Myalgia, other site: Secondary | ICD-10-CM | POA: Diagnosis not present

## 2020-03-02 DIAGNOSIS — Z8616 Personal history of COVID-19: Secondary | ICD-10-CM | POA: Insufficient documentation

## 2020-03-02 DIAGNOSIS — Z20822 Contact with and (suspected) exposure to covid-19: Secondary | ICD-10-CM | POA: Insufficient documentation

## 2020-03-02 DIAGNOSIS — Z91040 Latex allergy status: Secondary | ICD-10-CM | POA: Insufficient documentation

## 2020-03-02 DIAGNOSIS — I130 Hypertensive heart and chronic kidney disease with heart failure and stage 1 through stage 4 chronic kidney disease, or unspecified chronic kidney disease: Secondary | ICD-10-CM | POA: Diagnosis not present

## 2020-03-02 DIAGNOSIS — Z7951 Long term (current) use of inhaled steroids: Secondary | ICD-10-CM | POA: Insufficient documentation

## 2020-03-02 DIAGNOSIS — R202 Paresthesia of skin: Secondary | ICD-10-CM | POA: Diagnosis present

## 2020-03-02 DIAGNOSIS — I5042 Chronic combined systolic (congestive) and diastolic (congestive) heart failure: Secondary | ICD-10-CM | POA: Diagnosis not present

## 2020-03-02 DIAGNOSIS — Z950 Presence of cardiac pacemaker: Secondary | ICD-10-CM | POA: Insufficient documentation

## 2020-03-02 DIAGNOSIS — Z79899 Other long term (current) drug therapy: Secondary | ICD-10-CM | POA: Insufficient documentation

## 2020-03-02 LAB — COMPREHENSIVE METABOLIC PANEL
ALT: 32 U/L (ref 0–44)
AST: 36 U/L (ref 15–41)
Albumin: 3.9 g/dL (ref 3.5–5.0)
Alkaline Phosphatase: 127 U/L — ABNORMAL HIGH (ref 38–126)
Anion gap: 10 (ref 5–15)
BUN: 28 mg/dL — ABNORMAL HIGH (ref 6–20)
CO2: 28 mmol/L (ref 22–32)
Calcium: 9.5 mg/dL (ref 8.9–10.3)
Chloride: 105 mmol/L (ref 98–111)
Creatinine, Ser: 1.55 mg/dL — ABNORMAL HIGH (ref 0.44–1.00)
GFR, Estimated: 39 mL/min — ABNORMAL LOW (ref >60)
Glucose, Bld: 95 mg/dL (ref 70–99)
Potassium: 4.7 mmol/L (ref 3.5–5.1)
Sodium: 143 mmol/L (ref 135–145)
Total Bilirubin: 0.8 mg/dL (ref 0.3–1.2)
Total Protein: 7 g/dL (ref 6.5–8.1)

## 2020-03-02 LAB — DIFFERENTIAL
Abs Immature Granulocytes: 0.02 10*3/uL (ref 0.00–0.07)
Basophils Absolute: 0.1 10*3/uL (ref 0.0–0.1)
Basophils Relative: 1 %
Eosinophils Absolute: 0.2 10*3/uL (ref 0.0–0.5)
Eosinophils Relative: 4 %
Immature Granulocytes: 0 %
Lymphocytes Relative: 35 %
Lymphs Abs: 1.6 10*3/uL (ref 0.7–4.0)
Monocytes Absolute: 0.5 10*3/uL (ref 0.1–1.0)
Monocytes Relative: 10 %
Neutro Abs: 2.3 10*3/uL (ref 1.7–7.7)
Neutrophils Relative %: 50 %

## 2020-03-02 LAB — CUP PACEART REMOTE DEVICE CHECK
Battery Remaining Longevity: 14 mo
Battery Voltage: 2.88 V
Brady Statistic AP VP Percent: 0.02 %
Brady Statistic AP VS Percent: 0.01 %
Brady Statistic AS VP Percent: 98.66 %
Brady Statistic AS VS Percent: 1.31 %
Brady Statistic RA Percent Paced: 0.03 %
Brady Statistic RV Percent Paced: 0.92 %
Date Time Interrogation Session: 20220107001807
HighPow Impedance: 72 Ohm
Implantable Lead Implant Date: 20160421
Implantable Lead Implant Date: 20160421
Implantable Lead Implant Date: 20161006
Implantable Lead Implant Date: 20161006
Implantable Lead Location: 753858
Implantable Lead Location: 753858
Implantable Lead Location: 753859
Implantable Lead Location: 753860
Implantable Lead Model: 5071
Implantable Lead Model: 5071
Implantable Lead Model: 5076
Implantable Pulse Generator Implant Date: 20160421
Lead Channel Impedance Value: 323 Ohm
Lead Channel Impedance Value: 323 Ohm
Lead Channel Impedance Value: 380 Ohm
Lead Channel Impedance Value: 380 Ohm
Lead Channel Impedance Value: 4047 Ohm
Lead Channel Impedance Value: 4047 Ohm
Lead Channel Pacing Threshold Amplitude: 0.5 V
Lead Channel Pacing Threshold Amplitude: 1.375 V
Lead Channel Pacing Threshold Pulse Width: 0.4 ms
Lead Channel Pacing Threshold Pulse Width: 0.4 ms
Lead Channel Sensing Intrinsic Amplitude: 2.75 mV
Lead Channel Sensing Intrinsic Amplitude: 2.75 mV
Lead Channel Sensing Intrinsic Amplitude: 22.375 mV
Lead Channel Sensing Intrinsic Amplitude: 22.375 mV
Lead Channel Setting Pacing Amplitude: 1.5 V
Lead Channel Setting Pacing Amplitude: 2 V
Lead Channel Setting Pacing Amplitude: 2.5 V
Lead Channel Setting Pacing Pulse Width: 0.4 ms
Lead Channel Setting Pacing Pulse Width: 0.4 ms
Lead Channel Setting Sensing Sensitivity: 0.3 mV

## 2020-03-02 LAB — PROTIME-INR
INR: 1 (ref 0.8–1.2)
Prothrombin Time: 12.8 seconds (ref 11.4–15.2)

## 2020-03-02 LAB — CBC
HCT: 40.3 % (ref 36.0–46.0)
Hemoglobin: 12.7 g/dL (ref 12.0–15.0)
MCH: 30.6 pg (ref 26.0–34.0)
MCHC: 31.5 g/dL (ref 30.0–36.0)
MCV: 97.1 fL (ref 80.0–100.0)
Platelets: 182 10*3/uL (ref 150–400)
RBC: 4.15 MIL/uL (ref 3.87–5.11)
RDW: 13.8 % (ref 11.5–15.5)
WBC: 4.6 10*3/uL (ref 4.0–10.5)
nRBC: 0 % (ref 0.0–0.2)

## 2020-03-02 LAB — APTT: aPTT: 33 seconds (ref 24–36)

## 2020-03-02 LAB — POC SARS CORONAVIRUS 2 AG -  ED: SARS Coronavirus 2 Ag: NEGATIVE

## 2020-03-02 LAB — TROPONIN I (HIGH SENSITIVITY): Troponin I (High Sensitivity): 7 ng/L (ref <18)

## 2020-03-02 MED ORDER — SODIUM CHLORIDE 0.9% FLUSH: 3.0000 mL | Freq: Once | INTRAVENOUS | Status: DC

## 2020-03-02 MED ORDER — KETOROLAC TROMETHAMINE 60 MG/2ML IM SOLN
15.0000 mg | Freq: Once | INTRAMUSCULAR | Status: AC
  Administered 2020-03-02: 15 mg via INTRAMUSCULAR
  Filled 2020-03-02: qty 2

## 2020-03-02 MED ORDER — LIDOCAINE 5 % EX PTCH: 1.0000 | MEDICATED_PATCH | Freq: Two times a day (BID) | CUTANEOUS | 0 refills | Status: DC

## 2020-03-02 NOTE — Telephone Encounter (Signed)
Pt called in before putting her husband on her phone and said that she was having electrical issues with her system?? She then passed the phone to her husband and he said pt has been experiencing tingling in her fingers, she is repeating herself, can't remember things, and has dizziness. It started 2 days ago but is getting worse. I tried sending them to access nurse to be triaged but I was on hold for over 5 mins. I let them know that I will have someone call them back. Pt's husband said he just might go ahead and take her to ED but is waiting on a call back also.
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
## 2020-03-02 NOTE — Telephone Encounter (Signed)
I agree with ER disposition bc I cannot rule out any neurologic issues in the office .

## 2020-03-02 NOTE — Telephone Encounter (Signed)
Spoken to patient has been not coherent at times, repeating herself, tingling in her fingers and very unsteady on her feet. To husbands knowledge they have not heard back from Dr Manuella Ghazi for sx. Sx have been going on for the past couple of days, patient has not fainted nor fallen due to sx. Patient is currently in [facility] ED.

## 2020-03-02 NOTE — Discharge Instructions (Signed)
Your labs, CT scan of the head, and chest xray were all okay today.

## 2020-03-02 NOTE — ED Triage Notes (Signed)
First Nurse Note:  C/O worsening memory problems over the past several days.  Patient states "I can't remember anything.  I feel confused".  AAOx3.  Skin warm and dry no apparent distress

## 2020-03-02 NOTE — ED Triage Notes (Signed)
Pt is here with her husband , states the pt has been off balance and confused the past couple of days, pt states she is having a hard time finding her words, speech is clear with some delay and stuttering noted. Pt is repeating herself and not making a lot of sense. Pt c/o tingling pain down BL arms and feels shaky

## 2020-03-05 ENCOUNTER — Ambulatory Visit (INDEPENDENT_AMBULATORY_CARE_PROVIDER_SITE_OTHER): Payer: BC Managed Care – PPO

## 2020-03-05 DIAGNOSIS — I5032 Chronic diastolic (congestive) heart failure: Secondary | ICD-10-CM

## 2020-03-05 DIAGNOSIS — Z95810 Presence of automatic (implantable) cardiac defibrillator: Secondary | ICD-10-CM

## 2020-03-06 NOTE — Progress Notes (Signed)
EPIC Encounter for ICM Monitoring  Patient Name: Suzanne Spears is a 60 y.o. female Date: 03/06/2020 Primary Care Physican: [name], MD Primary Cardiologist: Rockey Situ Electrophysiologist: Vergie Living Pacing: 98.3% 02/27/2020 Weight: 148lbs 03/06/2020 Weight: 148-150 lbs     Spoke with patient.  She reports she is having some neurology trouble and went to ER on 03/02/2020.  She is having a lot of pain, losing her balance, trouble with getting expressing her words and has appointment with neurology tomorrow.  OptiVolThoracic impedancesuggesting possible fluid accumulation since 02/20/2020.  Prescribed:  Furosemide 20 mgtake1tablet(20 mg total).   Potassium 10 mEqtake1 tablet daily  Labs:  09/10/2021Creatinine1.26, BUN21, Potassium3.8, Sodium139, [id number] A complete set of results can be found in Results Review.  Recommendations:Patient does not take Furosemide consistently due to other medical issues she is currently having.  Advised to take Furosemide consistently and limit salt intake.   Follow-up plan: ICM clinic phone appointment on 04/02/2020. 91 day device clinic remote transmissiondue 06/01/2020.   EP/Cardiology Office Visits: Recall 4/2/2022with Dr.Gollan.Recall for 10/24/2020 with Dr Caryl Comes.  Copy of ICM check sent to Dr.Klein and Dr Rockey Situ for review and recommendations.   3 month ICM trend: 03/05/2020.    1 Year ICM trend:       [name], RN 03/06/2020 1:05 PM

## 2020-03-07 ENCOUNTER — Other Ambulatory Visit: Payer: Self-pay

## 2020-03-07 ENCOUNTER — Telehealth (INDEPENDENT_AMBULATORY_CARE_PROVIDER_SITE_OTHER): Payer: BC Managed Care – PPO | Admitting: Family Medicine

## 2020-03-07 DIAGNOSIS — R519 Headache, unspecified: Secondary | ICD-10-CM | POA: Insufficient documentation

## 2020-03-07 DIAGNOSIS — M5412 Radiculopathy, cervical region: Secondary | ICD-10-CM | POA: Insufficient documentation

## 2020-03-07 DIAGNOSIS — R4789 Other speech disturbances: Secondary | ICD-10-CM | POA: Diagnosis not present

## 2020-03-07 MED ORDER — METHYLPREDNISOLONE 4 MG PO TBPK: ORAL_TABLET | ORAL | 0 refills | Status: DC

## 2020-03-07 NOTE — Assessment & Plan Note (Signed)
Patient reports this is much improved and she appeared to have normal speech today during our visit.  I suspect this was related to taking a combination of medications that could induce drowsiness.  Discussed avoiding baclofen and gabapentin at this time.  CT scan was reassuring.
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
## 2020-03-07 NOTE — Assessment & Plan Note (Addendum)
Neck pain with radicular symptoms seem consistent with cervical radiculopathy.  Symptoms have slightly improved since her emergency department evaluation.  We will treat with a Medrol Dosepak as she has tolerated steroids other than prednisone in the past.  Discussed risk of agitation and sleeping difficulty with this medication and if those occur she will discontinue the medicine and let us know.  She will continue to try to follow-up with her neurologist.  I did advise that she avoid the baclofen and gabapentin at this time given her recent issues with incoherence when taking those medicines.  Work note provided to take the patient out of work through 03/12/2020.  The patient will contact us if she does not progressively improve.  She will contact us right away if she has any worsening symptoms.

## 2020-03-07 NOTE — Assessment & Plan Note (Signed)
I suspect this is related to the patient's neck pain given that it is occurring in the occipital region.  She had a CT scan that was reassuring.  She is unable to have an MRI.  We will see how she responds to the Medrol Dosepak.  She will continue to follow-up with neurology.

## 2020-03-15 ENCOUNTER — Telehealth: Payer: Self-pay

## 2020-03-15 NOTE — Telephone Encounter (Signed)
Attempted to call spouse number but could not be completed

## 2020-03-15 NOTE — Telephone Encounter (Signed)
Patient calling back adding she is afraid to stand up because of the lightheadedness. Patient reports she did eat some snacks this morning.  scheduled patient for an appointment 1/21 w/ Sharolyn Douglas

## 2020-03-15 NOTE — Telephone Encounter (Signed)
Spoke with patient and she reports that she has not been feeling well. She has been sitting at her desk on the computer and feeling lightheaded. Blood pressure check is 113/92 during this event. Inquired if she had been hydrating and she states that Dr. Caryl Comes does not want her to drink too much fluid due to her heart. She also states she just feels "out of wack and that her insides are quivering". She reports that she was in such pain. She does have expressive aphasia while on the phone and did conference call with Dr. Trena Platt office to schedule appointment. She has appointment with [name] at 08:00 AM here in our office and will have virtual visit with Dr. Manuella Ghazi at 10:00 AM. She was very appreciative for the call back, assisting with appointment, and had no further questions at this time. She confirmed all appointments with no further needs at this time.

## 2020-03-15 NOTE — Telephone Encounter (Signed)
patient calling - patient states she isnt not feeling well.  Patient went to the ED a week ago.  Patient had been feeling extreamly lightheaded and weak.   While patient was taking the phone went silent.  Asked for Mrs. Lupercio six times then hung up. When calling back it rang a few times then went to VM.

## 2020-03-15 NOTE — Progress Notes (Signed)
Remote ICD transmission.   

## 2020-03-15 NOTE — Telephone Encounter (Signed)
Patient calling to check status of form completion.  Unable to find in provider office.  Patient coming to office to see berge at 8 am and would like to pick them up while she is here.   Printed original forms and placed in nurse box.

## 2020-03-15 NOTE — Telephone Encounter (Signed)
Left voicemail message to call back  

## 2020-03-16 ENCOUNTER — Ambulatory Visit: Payer: BC Managed Care – PPO | Admitting: Nurse Practitioner

## 2020-03-16 ENCOUNTER — Telehealth: Payer: Self-pay | Admitting: *Deleted

## 2020-03-16 ENCOUNTER — Other Ambulatory Visit
Admission: RE | Admit: 2020-03-16 | Discharge: 2020-03-16 | Disposition: A | Discharge status: Home / Self Care | Payer: BC Managed Care – PPO | Source: Ambulatory Visit | Attending: Nurse Practitioner | Admitting: Nurse Practitioner

## 2020-03-16 ENCOUNTER — Encounter: Payer: Self-pay | Admitting: Nurse Practitioner

## 2020-03-16 ENCOUNTER — Other Ambulatory Visit: Payer: Self-pay

## 2020-03-16 VITALS — BP 88/70 | HR 89 | Ht 65.5 in | Wt 149.0 lb

## 2020-03-16 DIAGNOSIS — R42 Dizziness and giddiness: Secondary | ICD-10-CM

## 2020-03-16 DIAGNOSIS — I5032 Chronic diastolic (congestive) heart failure: Secondary | ICD-10-CM

## 2020-03-16 LAB — BASIC METABOLIC PANEL WITH GFR
Anion gap: 11 (ref 5–15)
BUN: 32 mg/dL — ABNORMAL HIGH (ref 6–20)
CO2: 26 mmol/L (ref 22–32)
Calcium: 9.6 mg/dL (ref 8.9–10.3)
Chloride: 105 mmol/L (ref 98–111)
Creatinine, Ser: 1.26 mg/dL — ABNORMAL HIGH (ref 0.44–1.00)
GFR, Estimated: 49 mL/min — ABNORMAL LOW
Glucose, Bld: 88 mg/dL (ref 70–99)
Potassium: 3.9 mmol/L (ref 3.5–5.1)
Sodium: 142 mmol/L (ref 135–145)

## 2020-03-16 MED ORDER — FUROSEMIDE 20 MG PO TABS: 10.0000 mg | ORAL_TABLET | ORAL | 3 refills | Status: DC

## 2020-03-16 NOTE — Telephone Encounter (Signed)
Spoke with patient and reviewed lab results and recommendations. She verbalized understanding. Reviewed instructions to reduce Furosemide 20 mg to 1/2 tablet (10 mg) once daily and take other 1/2 tablet (10 mg) as needed for weight gain or swelling. She verbalized understanding of all instructions, read back, and had no further questions at this time.

## 2020-03-16 NOTE — Addendum Note (Signed)
Addended by: Valora Corporal on: 03/16/2020 11:53 AM   Modules accepted: Orders

## 2020-03-16 NOTE — Telephone Encounter (Signed)
Forms completed  Patient made aware - placed in mail to patient

## 2020-03-16 NOTE — Patient Instructions (Addendum)
Medication Instructions:  Your physician has recommended you make the following change in your medication:  1. DECREASE Entresto 24-26 mg to 1/2 tablet twice a day  *If you need a refill on your cardiac medications before your next appointment, please call your pharmacy*   Lab Work: BMET to be done at [facility] today  If you have labs (blood work) drawn today and your tests are completely normal, you will receive your results only by:  [place] (if you have MyChart) OR  A paper copy in the mail If you have any lab test that is abnormal or we need to change your treatment, we will call you to review the results.   Testing/Procedures: None   Follow-Up: At [facility], you and your health needs are our priority.  As part of our continuing mission to provide you with exceptional heart care, we have created designated Provider Care Teams.  These Care Teams include your primary Cardiologist (physician) and Advanced Practice Providers (APPs -  Physician Assistants and Nurse Practitioners) who all work together to provide you with the care you need, when you need it.   Your next appointment:   1 month(s)  The format for your next appointment:   In Person  Provider:   Ida Rogue, MD or Murray Hodgkins, NP

## 2020-03-16 NOTE — Progress Notes (Signed)
Pt was given her FMLA paperwork signed by Dr. Rockey Situ at today's f/u visit with Ignacia Bayley, NP, along with progress notes with labs within the past year as requested. A copy of her FMLA was kept for records.

## 2020-03-16 NOTE — Telephone Encounter (Signed)
-----   Message from Theora Gianotti, NP sent at 03/16/2020  9:58 AM EST ----- Kidneys might be a little dry (BUN rising).  Pls reduce lasix to 10mg  daily.  She may take an additional 10mg  for wt gain/edema.  Lytes ok.
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
## 2020-03-22 ENCOUNTER — Other Ambulatory Visit: Payer: Self-pay

## 2020-03-22 ENCOUNTER — Telehealth: Payer: Self-pay

## 2020-03-22 MED ORDER — MAGNESIUM OXIDE 400 MG PO TABS: 400.0000 mg | ORAL_TABLET | Freq: Every day | ORAL | 0 refills | Status: DC

## 2020-03-22 MED ORDER — "SYRINGE 25G X 1"" 3 ML MISC": 0 refills | Status: DC

## 2020-03-22 NOTE — Telephone Encounter (Signed)
Yes,  Thank you for doing that

## 2020-03-22 NOTE — Telephone Encounter (Signed)
Received a fax from [facility] stating that pt would like a rx sent in for Magnesium oxide 400 mg. Are you okay if I send a new rx in for this medication? If so are the directions take one tablet daily?

## 2020-03-22 NOTE — Telephone Encounter (Signed)
Rx sent 

## 2020-04-02 ENCOUNTER — Ambulatory Visit (INDEPENDENT_AMBULATORY_CARE_PROVIDER_SITE_OTHER): Payer: BC Managed Care – PPO

## 2020-04-02 DIAGNOSIS — I5032 Chronic diastolic (congestive) heart failure: Secondary | ICD-10-CM | POA: Diagnosis not present

## 2020-04-02 DIAGNOSIS — Z95810 Presence of automatic (implantable) cardiac defibrillator: Secondary | ICD-10-CM | POA: Diagnosis not present

## 2020-04-03 NOTE — Progress Notes (Addendum)
EPIC Encounter for ICM Monitoring  Patient Name: Suzanne Spears is a 60 y.o. female Date: 04/03/2020 Primary Care Physican: [name], MD Primary Cardiologist: Rockey Situ Electrophysiologist: Vergie Living Pacing: 98.4% 03/06/2020 Weight: 148-150 lbs     Attempted call to patient and unable to reach.  Left message to return call. Transmission reviewed.     OptiVolThoracic impedancenormal but was suggesting possible fluid accumulationfrom 1/19 - 2/2.  Prescribed:  Furosemide 20 mgtake0.5 tablet (10 mg total) once a day.  May take extra 0.5 tablet as needed for weight gain or swelling.    Potassium 10 mEqtake1 tablet daily  Labs:  09/10/2021Creatinine1.26, BUN21, Potassium3.8, Sodium139, [id number] A complete set of results can be found in Results Review.  Recommendations:Unable to reach.    Follow-up plan: ICM clinic phone appointment on 05/07/2020. 91 day device clinic remote transmissiondue 06/01/2020.   EP/Cardiology Office Visits: 2/21/2022with Dr.Gollan.Recall for 10/24/2020 with Dr Caryl Comes.  Copy of ICM check sent to [facility].    3 month ICM trend: 04/02/2020.    1 Year ICM trend:       [name], RN 04/03/2020 4:27 PM

## 2020-04-04 NOTE — Progress Notes (Signed)
Spoke with patient.  She said she took full 20 mg Furosemide tablet for a few day and feels better.   She has no fluid symptoms at this time and will return to prescribed dosage of Furosemide 10 mg daily.
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
## 2020-04-16 ENCOUNTER — Encounter: Payer: Self-pay | Admitting: Cardiovascular Disease

## 2020-04-16 ENCOUNTER — Other Ambulatory Visit: Payer: Self-pay

## 2020-04-16 ENCOUNTER — Ambulatory Visit: Payer: BC Managed Care – PPO | Admitting: Cardiovascular Disease

## 2020-04-16 VITALS — BP 94/60 | HR 90 | Ht 65.5 in | Wt 151.0 lb

## 2020-04-16 DIAGNOSIS — T466X5A Adverse effect of antihyperlipidemic and antiarteriosclerotic drugs, initial encounter: Secondary | ICD-10-CM

## 2020-04-16 DIAGNOSIS — I428 Other cardiomyopathies: Secondary | ICD-10-CM | POA: Diagnosis not present

## 2020-04-16 DIAGNOSIS — I10 Essential (primary) hypertension: Secondary | ICD-10-CM

## 2020-04-16 DIAGNOSIS — I5032 Chronic diastolic (congestive) heart failure: Secondary | ICD-10-CM | POA: Diagnosis not present

## 2020-04-16 DIAGNOSIS — Z95810 Presence of automatic (implantable) cardiac defibrillator: Secondary | ICD-10-CM | POA: Diagnosis not present

## 2020-04-16 DIAGNOSIS — I420 Dilated cardiomyopathy: Secondary | ICD-10-CM

## 2020-04-16 DIAGNOSIS — U071 COVID-19: Secondary | ICD-10-CM

## 2020-04-16 DIAGNOSIS — M791 Myalgia, unspecified site: Secondary | ICD-10-CM

## 2020-04-16 MED ORDER — EZETIMIBE 10 MG PO TABS: 10.0000 mg | ORAL_TABLET | Freq: Every day | ORAL | 3 refills | Status: DC

## 2020-04-16 NOTE — Patient Instructions (Signed)
Medication Instructions:  Try zetia 10 mg daily, for cholesterol  If you need a refill on your cardiac medications before your next appointment, please call your pharmacy.    Lab work: No new labs needed   If you have labs (blood work) drawn today and your tests are completely normal, you will receive your results only by: Marland Kitchen MyChart Message (if you have MyChart) OR . A paper copy in the mail If you have any lab test that is abnormal or we need to change your treatment, we will call you to review the results.   Testing/Procedures: No new testing needed   Follow-Up: At [facility], you and your health needs are our priority.  As part of our continuing mission to provide you with exceptional heart care, we have created designated Provider Care Teams.  These Care Teams include your primary Cardiologist (physician) and Advanced Practice Providers (APPs -  Physician Assistants and Nurse Practitioners) who all work together to provide you with the care you need, when you need it.  . You will need a follow up appointment in 6 months. APP ok  . Providers on your designated Care Team:   . Murray Hodgkins, NP . Christell Faith, PA-C . Marrianne Mood, PA-C  Any Other Special Instructions Will Be Listed Below (If Applicable).  COVID-19 Vaccine Information can be found at: ShippingScam.co.uk For questions related to vaccine distribution or appointments, please email [email or web address] or call 2103709212.

## 2020-04-25 ENCOUNTER — Telehealth: Payer: Self-pay | Admitting: Internal Medicine

## 2020-04-25 MED ORDER — VENLAFAXINE HCL ER 150 MG PO CP24: 150.0000 mg | ORAL_CAPSULE | Freq: Every day | ORAL | 0 refills | Status: DC

## 2020-04-25 NOTE — Telephone Encounter (Signed)
[facility] called for refill for venlafaxine XR (EFFEXOR-XR) 150 MG 24 hr capsule and it need to go to [facility]

## 2020-04-25 NOTE — Telephone Encounter (Signed)
Last OV 9/21 okay to fill?

## 2020-04-27 ENCOUNTER — Telehealth: Payer: Self-pay | Admitting: Internal Medicine

## 2020-04-27 MED ORDER — MONTELUKAST SODIUM 10 MG PO TABS: ORAL_TABLET | ORAL | 3 refills | Status: DC

## 2020-04-27 MED ORDER — ZOSTER VAC RECOMB ADJUVANTED 50 MCG/0.5ML IM SUSR: 0.5000 mL | Freq: Once | INTRAMUSCULAR | 1 refills | Status: DC

## 2020-04-27 NOTE — Addendum Note (Signed)
Addended by: Adair Laundry on: 04/27/2020 12:50 PM   Modules accepted: Orders

## 2020-04-27 NOTE — Telephone Encounter (Signed)
Pt would like to get her a order to have a order for a shingrix sent to [facility]

## 2020-04-27 NOTE — Telephone Encounter (Signed)
rx has been sent to pharmacy and pt is aware.

## 2020-04-27 NOTE — Telephone Encounter (Signed)
Pt needs a refill on montelukast (SINGULAIR) 10 MG tablet sent to [facility]

## 2020-04-30 ENCOUNTER — Other Ambulatory Visit: Payer: Self-pay | Admitting: Internal Medicine

## 2020-05-01 ENCOUNTER — Encounter: Payer: Self-pay | Admitting: Internal Medicine

## 2020-05-01 ENCOUNTER — Telehealth (INDEPENDENT_AMBULATORY_CARE_PROVIDER_SITE_OTHER): Payer: BC Managed Care – PPO | Admitting: Internal Medicine

## 2020-05-01 VITALS — Ht 65.5 in | Wt 147.8 lb

## 2020-05-01 DIAGNOSIS — R109 Unspecified abdominal pain: Secondary | ICD-10-CM | POA: Diagnosis not present

## 2020-05-01 DIAGNOSIS — K2900 Acute gastritis without bleeding: Secondary | ICD-10-CM | POA: Diagnosis not present

## 2020-05-01 DIAGNOSIS — R140 Abdominal distension (gaseous): Secondary | ICD-10-CM | POA: Diagnosis not present

## 2020-05-01 DIAGNOSIS — K2970 Gastritis, unspecified, without bleeding: Secondary | ICD-10-CM | POA: Insufficient documentation

## 2020-05-01 NOTE — Assessment & Plan Note (Signed)
Suspect ileus vs obstipation.  KUB and   Labs ordered , to be doen at [facility] tomorrow morning. .  Advised to try bulk forming laxative tonight.

## 2020-05-01 NOTE — Assessment & Plan Note (Signed)
Starting protonix.  Can use mylanta for immediate relief/
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
## 2020-05-02 ENCOUNTER — Other Ambulatory Visit: Payer: Self-pay | Admitting: Internal Medicine

## 2020-05-02 ENCOUNTER — Other Ambulatory Visit
Admission: RE | Admit: 2020-05-02 | Discharge: 2020-05-02 | Disposition: A | Discharge status: Home / Self Care | Payer: BC Managed Care – PPO | Source: Home / Self Care | Attending: Internal Medicine | Admitting: Internal Medicine

## 2020-05-02 ENCOUNTER — Ambulatory Visit
Admission: RE | Admit: 2020-05-02 | Discharge: 2020-05-02 | Disposition: A | Discharge status: Home / Self Care | Payer: BC Managed Care – PPO | Source: Ambulatory Visit | Attending: Internal Medicine | Admitting: Internal Medicine

## 2020-05-02 ENCOUNTER — Ambulatory Visit
Admission: RE | Admit: 2020-05-02 | Discharge: 2020-05-02 | Disposition: A | Discharge status: Home / Self Care | Payer: BC Managed Care – PPO | Attending: Internal Medicine | Admitting: Internal Medicine

## 2020-05-02 DIAGNOSIS — R109 Unspecified abdominal pain: Secondary | ICD-10-CM

## 2020-05-02 DIAGNOSIS — R140 Abdominal distension (gaseous): Secondary | ICD-10-CM | POA: Diagnosis present

## 2020-05-02 LAB — COMPREHENSIVE METABOLIC PANEL
ALT: 36 U/L (ref 0–44)
AST: 33 U/L (ref 15–41)
Albumin: 3.8 g/dL (ref 3.5–5.0)
Alkaline Phosphatase: 125 U/L (ref 38–126)
Anion gap: 6 (ref 5–15)
BUN: 22 mg/dL — ABNORMAL HIGH (ref 6–20)
CO2: 26 mmol/L (ref 22–32)
Calcium: 9 mg/dL (ref 8.9–10.3)
Chloride: 108 mmol/L (ref 98–111)
Creatinine, Ser: 1.04 mg/dL — ABNORMAL HIGH (ref 0.44–1.00)
GFR, Estimated: >60 mL/min (ref >60)
Glucose, Bld: 93 mg/dL (ref 70–99)
Potassium: 3.7 mmol/L (ref 3.5–5.1)
Sodium: 140 mmol/L (ref 135–145)
Total Bilirubin: 0.6 mg/dL (ref 0.3–1.2)
Total Protein: 6.6 g/dL (ref 6.5–8.1)

## 2020-05-02 LAB — CBC WITH DIFFERENTIAL/PLATELET
Abs Immature Granulocytes: 0.02 10*3/uL (ref 0.00–0.07)
Basophils Absolute: 0.1 10*3/uL (ref 0.0–0.1)
Basophils Relative: 1 %
Eosinophils Absolute: 0.1 10*3/uL (ref 0.0–0.5)
Eosinophils Relative: 1 %
HCT: 40.2 % (ref 36.0–46.0)
Hemoglobin: 13 g/dL (ref 12.0–15.0)
Immature Granulocytes: 0 %
Lymphocytes Relative: 13 %
Lymphs Abs: 1.1 10*3/uL (ref 0.7–4.0)
MCH: 30.6 pg (ref 26.0–34.0)
MCHC: 32.3 g/dL (ref 30.0–36.0)
MCV: 94.6 fL (ref 80.0–100.0)
Monocytes Absolute: 0.6 10*3/uL (ref 0.1–1.0)
Monocytes Relative: 7 %
Neutro Abs: 6.7 10*3/uL (ref 1.7–7.7)
Neutrophils Relative %: 78 %
Platelets: 199 10*3/uL (ref 150–400)
RBC: 4.25 MIL/uL (ref 3.87–5.11)
RDW: 13.1 % (ref 11.5–15.5)
WBC: 8.5 10*3/uL (ref 4.0–10.5)
nRBC: 0 % (ref 0.0–0.2)

## 2020-05-02 LAB — MAGNESIUM: Magnesium: 1.8 mg/dL (ref 1.7–2.4)

## 2020-05-02 LAB — SEDIMENTATION RATE: Sed Rate: 13 mm/hr (ref 0–30)

## 2020-05-04 ENCOUNTER — Telehealth: Payer: BC Managed Care – PPO | Admitting: Internal Medicine

## 2020-05-07 ENCOUNTER — Telehealth: Payer: Self-pay

## 2020-05-07 ENCOUNTER — Ambulatory Visit (INDEPENDENT_AMBULATORY_CARE_PROVIDER_SITE_OTHER): Payer: BC Managed Care – PPO

## 2020-05-07 DIAGNOSIS — I5032 Chronic diastolic (congestive) heart failure: Secondary | ICD-10-CM | POA: Diagnosis not present

## 2020-05-07 DIAGNOSIS — Z95810 Presence of automatic (implantable) cardiac defibrillator: Secondary | ICD-10-CM | POA: Diagnosis not present

## 2020-05-07 NOTE — Progress Notes (Signed)
EPIC Encounter for ICM Monitoring  Patient Name: Suzanne Spears is a 60 y.o. female Date: 05/07/2020 Primary Care Physican: [name], MD Primary Cardiologist: Rockey Situ Electrophysiologist: Vergie Living Pacing: 98.3% 05/01/2020 Weight: 147 lbs     Attempted call to patient and unable to reach.  Left detailed message per DPR regarding transmission. Transmission reviewed.   OptiVolThoracic impedancesuggesting possible fluid accumulationstarting 04/27/2020.  Prescribed:  Furosemide 20 mgTake half tablet (10 mg) once daily. May take extra half tablet (10 mg) as needed for weight gain or swelling.  Potassium 10 mEqtake1 tablet daily  Labs: 05/02/2020 Creatinine 1.04, BUN 22, Potassium 3.7, Sodium 140, GFR >60 09/10/2021Creatinine1.26, BUN21, Potassium3.8, Sodium139, [id number] A complete set of results can be found in Results Review.  Recommendations:Left voice mail with ICM number and encouraged to call if experiencing any fluid symptoms.  Follow-up plan: ICM clinic phone appointment on3/21/2022 (manual) to recheck fluid levels.91 day device clinic remote transmissiondue4/09/2020.   EP/Cardiology Office Visits: 8/23/2022with Dr.Gollan.Recall for 10/24/2020 with Dr Caryl Comes.  Copy of ICM check sent to [place] and Dr.Gollan.  3 month ICM trend: 05/07/2020.    1 Year ICM trend:       [name], RN 05/07/2020 12:15 PM

## 2020-05-07 NOTE — Telephone Encounter (Signed)
Remote ICM transmission received.  Attempted call to patient regarding ICM remote transmission and left detailed message per DPR.  Advised to return call for any fluid symptoms or questions.  

## 2020-05-14 ENCOUNTER — Ambulatory Visit (INDEPENDENT_AMBULATORY_CARE_PROVIDER_SITE_OTHER): Payer: BC Managed Care – PPO

## 2020-05-14 DIAGNOSIS — I5032 Chronic diastolic (congestive) heart failure: Secondary | ICD-10-CM

## 2020-05-14 DIAGNOSIS — Z95810 Presence of automatic (implantable) cardiac defibrillator: Secondary | ICD-10-CM

## 2020-05-17 ENCOUNTER — Telehealth: Payer: Self-pay

## 2020-05-17 NOTE — Telephone Encounter (Signed)
LMOVM for pt to send missed ICM transmission.

## 2020-05-18 NOTE — Progress Notes (Addendum)
EPIC Encounter for ICM Monitoring  Patient Name: Suzanne Spears is a 60 y.o. female Date: 05/18/2020 Primary Care Physican: [name], MD Primary Cardiologist: Rockey Situ Electrophysiologist: Vergie Living Pacing: 98.3% 05/01/2020 Weight: 147 lbs     Spoke with patient.  She took 1 tablet of Furosemide because she felt like she was retaining fluid on 1/2 pill.     OptiVolThoracic impedancesuggesting fluid levels have returned to close to normal.  Prescribed:  Furosemide 20 mgTake half tablet (10 mg) once daily. May take extra half tablet (10 mg) as needed for weight gain or swelling.  Potassium 10 mEqtake1 tablet daily  Labs: 05/02/2020 Creatinine 1.04, BUN 22, Potassium 3.7, Sodium 140, GFR >60 09/10/2021Creatinine1.26, BUN21, Potassium3.8, Sodium139, FTD32-20 A complete set of results can be found in Results Review.  Recommendations:She reports she will take the Furosemide tablet daily instead of 1/2 tablet because she feels like the 0.5 tablet is not keeping the fluid off.  She will call Dr Donivan Scull office to make office visit with April.    Follow-up plan: ICM clinic phone appointment on4/18/2022.91 day device clinic remote transmissiondue4/09/2020.   EP/Cardiology Office Visits: 8/23/2022with Dr.Gollan.Recall for 10/24/2020 with Dr Caryl Comes.  Copy of ICM check sent to [place] and Dr.Gollan.  3 month ICM trend: 05/18/2020.    1 Year ICM trend:       [name], RN 05/18/2020 3:24 PM

## 2020-05-22 ENCOUNTER — Telehealth: Payer: Self-pay

## 2020-05-22 NOTE — Telephone Encounter (Signed)
The patient heard a tone from her Icd. I asked her was she near any magnets and she states the heating pad has a magnet. She states it was around her neck because she was having neck pains. She is going to unplug the heating pad. I asked her to send a transmission just in case. She agreed to send it. I told her if she do not receive a call back then everything looks good. If she get a call back then the nurse seen something. No news is good news.

## 2020-05-22 NOTE — Telephone Encounter (Signed)
Transmission received.  Nothing on report to indicate an alert occurred. Optivol level is elevated.  Spokw with pt, she had a single long tone occurred (not alternating sound).  She was wearing heating pad with magnet in it at the time.  Pt v/u that she needs to keep magnet at least 6 inches from ICD.    In regards to optivol level pt, noted she was awareof fluid retention, her diet has not been optimal lately.  She is currently taking full tab of lasix and noted she is closely monitoring her weight.  Today she noted decrease by 1/2 pound.

## 2020-05-24 ENCOUNTER — Telehealth: Payer: Self-pay | Admitting: Internal Medicine

## 2020-05-24 MED ORDER — TRAZODONE HCL 50 MG PO TABS: ORAL_TABLET | ORAL | 1 refills | Status: DC

## 2020-05-24 NOTE — Addendum Note (Signed)
Addended by: [name] on: 05/24/2020 01:29 PM   Modules accepted: Orders

## 2020-05-24 NOTE — Telephone Encounter (Signed)
[facility] called refill for tramadol and traZODone (DESYREL) 50 MG tablet

## 2020-05-25 ENCOUNTER — Other Ambulatory Visit: Payer: Self-pay | Admitting: Internal Medicine

## 2020-05-31 ENCOUNTER — Other Ambulatory Visit: Payer: Self-pay | Admitting: Internal Medicine

## 2020-06-01 ENCOUNTER — Ambulatory Visit (INDEPENDENT_AMBULATORY_CARE_PROVIDER_SITE_OTHER): Payer: BC Managed Care – PPO

## 2020-06-01 DIAGNOSIS — I420 Dilated cardiomyopathy: Secondary | ICD-10-CM | POA: Diagnosis not present

## 2020-06-01 LAB — CUP PACEART REMOTE DEVICE CHECK
Battery Remaining Longevity: 11 mo
Battery Voltage: 2.87 V
Brady Statistic AP VP Percent: 0.02 %
Brady Statistic AP VS Percent: 0.01 %
Brady Statistic AS VP Percent: 98.65 %
Brady Statistic AS VS Percent: 1.32 %
Brady Statistic RA Percent Paced: 0.03 %
Brady Statistic RV Percent Paced: 2.23 %
Date Time Interrogation Session: 20220408012504
HighPow Impedance: 87 Ohm
Implantable Lead Implant Date: 20160421
Implantable Lead Implant Date: 20160421
Implantable Lead Implant Date: 20161006
Implantable Lead Implant Date: 20161006
Implantable Lead Location: 753858
Implantable Lead Location: 753858
Implantable Lead Location: 753859
Implantable Lead Location: 753860
Implantable Lead Model: 5071
Implantable Lead Model: 5071
Implantable Lead Model: 5076
Implantable Pulse Generator Implant Date: 20160421
Lead Channel Impedance Value: 323 Ohm
Lead Channel Impedance Value: 342 Ohm
Lead Channel Impedance Value: 380 Ohm
Lead Channel Impedance Value: 399 Ohm
Lead Channel Impedance Value: 4047 Ohm
Lead Channel Impedance Value: 4047 Ohm
Lead Channel Pacing Threshold Amplitude: 0.5 V
Lead Channel Pacing Threshold Amplitude: 1.375 V
Lead Channel Pacing Threshold Pulse Width: 0.4 ms
Lead Channel Pacing Threshold Pulse Width: 0.4 ms
Lead Channel Sensing Intrinsic Amplitude: 26.125 mV
Lead Channel Sensing Intrinsic Amplitude: 26.125 mV
Lead Channel Sensing Intrinsic Amplitude: 3 mV
Lead Channel Sensing Intrinsic Amplitude: 3 mV
Lead Channel Setting Pacing Amplitude: 1.5 V
Lead Channel Setting Pacing Amplitude: 2 V
Lead Channel Setting Pacing Amplitude: 2.5 V
Lead Channel Setting Pacing Pulse Width: 0.4 ms
Lead Channel Setting Pacing Pulse Width: 0.4 ms
Lead Channel Setting Sensing Sensitivity: 0.3 mV

## 2020-06-11 ENCOUNTER — Ambulatory Visit (INDEPENDENT_AMBULATORY_CARE_PROVIDER_SITE_OTHER): Payer: BC Managed Care – PPO

## 2020-06-11 DIAGNOSIS — Z95810 Presence of automatic (implantable) cardiac defibrillator: Secondary | ICD-10-CM | POA: Diagnosis not present

## 2020-06-11 DIAGNOSIS — I5032 Chronic diastolic (congestive) heart failure: Secondary | ICD-10-CM | POA: Diagnosis not present

## 2020-06-13 ENCOUNTER — Telehealth: Payer: Self-pay

## 2020-06-13 DIAGNOSIS — I5032 Chronic diastolic (congestive) heart failure: Secondary | ICD-10-CM

## 2020-06-13 MED ORDER — FUROSEMIDE 20 MG PO TABS: 20.0000 mg | ORAL_TABLET | Freq: Every day | ORAL | 3 refills | Status: DC

## 2020-06-13 MED ORDER — POTASSIUM CHLORIDE ER 10 MEQ PO TBCR: 20.0000 meq | EXTENDED_RELEASE_TABLET | Freq: Every day | ORAL | 3 refills | Status: DC

## 2020-06-13 NOTE — Telephone Encounter (Signed)
Spoke with patient.  Advised following:  1. Change Furosemide dosage to 20 mg by mouth daily and may take extra 20 mg as needed 2. Increase Potassium 10 mEq to 2 tablets daily 3. BMET - will place order and patient agreed to get the BMET at [facility] week of 5/16/022.    She verbalized understanding and agreed to plan.   Prescriptions sent to [facility] as requested.

## 2020-06-13 NOTE — Telephone Encounter (Signed)
Received following recommendations from Dr Rockey Situ:  Suzanne Merritts, MD  You 6 minutes ago (4:08 PM)    We can increase her up to Lasix 20 daily with extra 20 as needed  Potassium should increase up to 20 mill equivalents daily  Consider BMP in several weeks time  Thx  TG

## 2020-06-13 NOTE — Progress Notes (Signed)
EPIC Encounter for ICM Monitoring  Patient Name: Suzanne Spears is a 60 y.o. female Date: 06/13/2020 Primary Care Physican: [name], MD Primary Cardiologist: Rockey Situ Electrophysiologist: Vergie Living Pacing: 98.1% 06/05/2020 Weight: 155.8 4/20/2022Weight: 154.6lbs (baseline 150 lbs)     Spoke with patient and weight is 4-6 lbs above baseline of 150 lbs. Wearing compression stockings to help with leg swelling.         Pt is having difficulty following low salt diet and discussed at length ways to adjust diet to low sodium        She is not taking Furosemide as prescribed. In March she started taking Furosemide 1 tablet, 20 mg daily due to frequent fluid symptoms.  There are days that she takes 2 tablets if needed.        She reports Entresto and Furosemide were decreased in January due to lightheadedness and dizziness.  She is currently tolerating higher Furosemide dosage since Entresto was decreased.   OptiVolThoracic impedancesuggesting fluid levels continue to frequently alternate between possible fluid accumulation and baseline normal.Impedance close to baseline on 4/18 transmission date.  Prescribed:  Furosemide 20 mgTake half tablet (10 mg) once daily. May take extra half tablet (10 mg) as needed for weight gain or swelling.   Potassium 10 mEqtake1 tablet daily  Labs: 05/02/2020 Creatinine 1.04, BUN 22, Potassium 3.7, Sodium 140, GFR >60 03/16/2020 Creatinine 1.26, BUN 32, Potassium 3.9, Sodium 142, GFR 49 03/02/2020 Creatinine 1.55, BUN 28, Potassium 4.7, Sodium 143, GFR 39 A complete set of results can be found in Results Review.  Recommendations:Phone note sent to Dr Rockey Situ for review and recommendations for the following: 1.  Pt would like to have long term Furosemide dosage increased to 20 mg daily with extra when needed. 2.  Recommendation if Potassium dosage needs to be adjusted with a change in Furosemide 3.  Is BMET needed due to  patient has been taking extra Furosemide since March.  Follow-up plan: ICM clinic phone appointment on 07/17/2020.91 day device clinic remote transmissiondue7/12/2020.   EP/Cardiology Office Visits: 8/23/2022with Dr.Gollan.Recall for 10/24/2020 with Dr Caryl Comes.  Copy of ICM check sent to [place].  3 month ICM trend: 06/11/2020.    1 Year ICM trend:       [name], RN 06/13/2020 8:04 AM

## 2020-06-13 NOTE — Telephone Encounter (Signed)
ICM call to patient.  She reports weight is 4-6 lbs above baseline of 150 lbs. Wearing compression stockings to help with leg swelling.   She's having difficulty following low salt diet and discussed at length ways to adjust diet to low sodium.   She is not taking Furosemide as prescribed.  In March she started taking Furosemide 1 tablet 20 mg daily due frequent fluid symptoms.  There are days that she takes 2 tablets when needed.  She reports Furosemide and Entresto were decreased in January due to dizziness and lightheadedness.  She is currently tolerating the higher dosage of Furosemide and feels like the lightheadedness was caused by Delene Loll.     OptiVolThoracic impedancesuggesting fluid levels continue to frequently alternate between possible fluid accumulation and baseline normal. Impedance close to baseline on 4/18 transmission date.  Prescribed:  Furosemide 20 mgTake half tablet (10 mg) once daily. May take extra half tablet (10 mg) as needed for weight gain or swelling.   Potassium 10 mEqtake1 tablet daily  Labs: 05/02/2020 Creatinine 1.04, BUN 22, Potassium 3.7, Sodium 140, GFR >60 03/16/2020 Creatinine 1.26, BUN 32, Potassium 3.9, Sodium 142, GFR 49 03/02/2020 Creatinine 1.55, BUN 28, Potassium 4.7, Sodium 143, GFR 39 A complete set of results can be found in Results Review.   Copy sent to Dr Rockey Situ for review and recommendations for the following:    1.  Pt asking if the long term Furosemide dosage can be increased to 20 mg daily with extra when needed.   2.  Recommendation if Potassium dosage needs to be adjusted with a change in Furosemide.   3.  Is BMET needed due to patient has been taking extra Furosemide for past 6-7 weeks.   3 month ICM trend: 06/11/2020.

## 2020-06-13 NOTE — Progress Notes (Signed)
Spoke with patient.  Advised following:  1. Change Furosemide dosage to 20 mg by mouth daily and may take extra 20 mg as needed 2. Increase Potassium 10 mEq to 2 tablets daily 3. BMET - will place order and patient agreed to get the BMET at [facility] week of 5/16/022.    She verbalized understanding and agreed to plan.   Prescriptions sent to [facility] as requested.                     Edit Delete Tag Copy      Sign at exiting of workspace        Received following recommendations from Dr Rockey Situ:  Minna Merritts, MD  You 6 minutes ago (4:08 PM)    We can increase her up to Lasix 20 daily with extra 20 as needed  Potassium should increase up to 20 mill equivalents daily  Consider BMP in several weeks time  Thx  TG

## 2020-06-14 NOTE — Progress Notes (Signed)
Remote ICD transmission.   

## 2020-06-28 ENCOUNTER — Other Ambulatory Visit: Payer: Self-pay | Admitting: Internal Medicine

## 2020-07-02 ENCOUNTER — Ambulatory Visit (INDEPENDENT_AMBULATORY_CARE_PROVIDER_SITE_OTHER): Payer: BC Managed Care – PPO

## 2020-07-02 DIAGNOSIS — I255 Ischemic cardiomyopathy: Secondary | ICD-10-CM

## 2020-07-02 LAB — CUP PACEART REMOTE DEVICE CHECK
Battery Remaining Longevity: 12 mo
Battery Voltage: 2.87 V
Brady Statistic AP VP Percent: 0.03 %
Brady Statistic AP VS Percent: 0.01 %
Brady Statistic AS VP Percent: 98.57 %
Brady Statistic AS VS Percent: 1.4 %
Brady Statistic RA Percent Paced: 0.04 %
Brady Statistic RV Percent Paced: 1.99 %
Date Time Interrogation Session: 20220509044225
HighPow Impedance: 78 Ohm
Implantable Lead Implant Date: 20160421
Implantable Lead Implant Date: 20160421
Implantable Lead Implant Date: 20161006
Implantable Lead Implant Date: 20161006
Implantable Lead Location: 753858
Implantable Lead Location: 753858
Implantable Lead Location: 753859
Implantable Lead Location: 753860
Implantable Lead Model: 5071
Implantable Lead Model: 5071
Implantable Lead Model: 5076
Implantable Pulse Generator Implant Date: 20160421
Lead Channel Impedance Value: 285 Ohm
Lead Channel Impedance Value: 323 Ohm
Lead Channel Impedance Value: 342 Ohm
Lead Channel Impedance Value: 342 Ohm
Lead Channel Impedance Value: 4047 Ohm
Lead Channel Impedance Value: 4047 Ohm
Lead Channel Pacing Threshold Amplitude: 0.5 V
Lead Channel Pacing Threshold Amplitude: 1.5 V
Lead Channel Pacing Threshold Pulse Width: 0.4 ms
Lead Channel Pacing Threshold Pulse Width: 0.4 ms
Lead Channel Sensing Intrinsic Amplitude: 2.375 mV
Lead Channel Sensing Intrinsic Amplitude: 2.375 mV
Lead Channel Sensing Intrinsic Amplitude: 22 mV
Lead Channel Sensing Intrinsic Amplitude: 22 mV
Lead Channel Setting Pacing Amplitude: 1.5 V
Lead Channel Setting Pacing Amplitude: 2 V
Lead Channel Setting Pacing Amplitude: 2.5 V
Lead Channel Setting Pacing Pulse Width: 0.4 ms
Lead Channel Setting Pacing Pulse Width: 0.4 ms
Lead Channel Setting Sensing Sensitivity: 0.3 mV

## 2020-07-16 ENCOUNTER — Ambulatory Visit (INDEPENDENT_AMBULATORY_CARE_PROVIDER_SITE_OTHER): Payer: BC Managed Care – PPO

## 2020-07-16 DIAGNOSIS — Z95810 Presence of automatic (implantable) cardiac defibrillator: Secondary | ICD-10-CM

## 2020-07-16 DIAGNOSIS — I5032 Chronic diastolic (congestive) heart failure: Secondary | ICD-10-CM | POA: Diagnosis not present

## 2020-07-17 ENCOUNTER — Ambulatory Visit (INDEPENDENT_AMBULATORY_CARE_PROVIDER_SITE_OTHER): Payer: BC Managed Care – PPO | Admitting: Cardiovascular Disease

## 2020-07-17 ENCOUNTER — Other Ambulatory Visit: Payer: Self-pay | Admitting: Cardiovascular Disease

## 2020-07-17 ENCOUNTER — Telehealth: Payer: Self-pay | Admitting: Internal Medicine

## 2020-07-17 ENCOUNTER — Encounter: Payer: Self-pay | Admitting: Cardiovascular Disease

## 2020-07-17 ENCOUNTER — Other Ambulatory Visit: Payer: Self-pay

## 2020-07-17 VITALS — BP 102/62 | HR 76 | Ht 65.5 in | Wt 155.2 lb

## 2020-07-17 DIAGNOSIS — I420 Dilated cardiomyopathy: Secondary | ICD-10-CM

## 2020-07-17 DIAGNOSIS — I5032 Chronic diastolic (congestive) heart failure: Secondary | ICD-10-CM | POA: Diagnosis not present

## 2020-07-17 DIAGNOSIS — F418 Other specified anxiety disorders: Secondary | ICD-10-CM | POA: Diagnosis not present

## 2020-07-17 DIAGNOSIS — R079 Chest pain, unspecified: Secondary | ICD-10-CM | POA: Diagnosis not present

## 2020-07-17 NOTE — Patient Instructions (Signed)
Medication Instructions:  NTG SL  Generic pepcid/rantitidine Tums, Soda  mylanta    If you need a refill on your cardiac medications before your next appointment, please call your pharmacy.    Lab work: No new labs needed   If you have labs (blood work) drawn today and your tests are completely normal, you will receive your results only by: Marland Kitchen MyChart Message (if you have MyChart) OR . A paper copy in the mail If you have any lab test that is abnormal or we need to change your treatment, we will call you to review the results.   Testing/Procedures: No new testing needed   Follow-Up: At [facility], you and your health needs are our priority.  As part of our continuing mission to provide you with exceptional heart care, we have created designated Provider Care Teams.  These Care Teams include your primary Cardiologist (physician) and Advanced Practice Providers (APPs -  Physician Assistants and Nurse Practitioners) who all work together to provide you with the care you need, when you need it.  . You will need a follow up appointment in 6 months  . Providers on your designated Care Team:   . Murray Hodgkins, NP . Christell Faith, PA-C . Marrianne Mood, PA-C  Any Other Special Instructions Will Be Listed Below (If Applicable).  COVID-19 Vaccine Information can be found at: ShippingScam.co.uk For questions related to vaccine distribution or appointments, please email [email or web address] or call [phone].

## 2020-07-17 NOTE — Telephone Encounter (Signed)
C/o episode Friday of feeling "off" and clammy trouble sleeping.  Now Heartburn nausea and still feeling "off"  Patient not sure if she needs an appt.   Please call to discuss.

## 2020-07-17 NOTE — Telephone Encounter (Signed)
I spoke with the patient. She states that she had to go into her office on Friday and wasn't feeling "quite right" and a little clammy. She confirms her energy level was lower over the weekend. Last night, she was awoken from sleep with feelings of what she can best describe as "heart burn but not exactly that." She had to sit up and had a hard time going back to sleep. She denies have eaten anything out of the ordinary and no recent med changes.  BP has been 120/99 HR- 79 bpm  She has some mild dizziness.  She advised again "I just don't feel quite right."  I have offered her an appointment in the office today for further evaluation by Dr. Rockey Situ at 4:20 pm.  I have also asked her to transmit as well so her device info can be reviewed prior to that appointment.  She does not have a history of CAD.  The patient is agreeable with transmitting and seeing Dr. Rockey Situ this afternoon. She will have her sister drive her.

## 2020-07-17 NOTE — Telephone Encounter (Signed)
       Normal device function. Transmission from 07/16/20.

## 2020-07-17 NOTE — Progress Notes (Signed)
EPIC Encounter for ICM Monitoring  Patient Name: Suzanne Spears is a 60 y.o. female Date: 07/17/2020 Primary Care Physican: [name], MD Primary Cardiologist: Rockey Situ Electrophysiologist: Vergie Living Pacing: 98.4% 4/20/2022Weight: 154.6lbs (baseline 150 lbs)     Transmission reviewed.  Patient being seen by Dr Rockey Situ today, 07/17/2020 in the office.   OptiVolThoracic impedancesuggestingpossible fluid accumulation starting 07/10/2020.  Fluid levels continue to frequently alternate between possible fluid accumulation and baseline normal.  Prescribed:  Furosemide 20 mgTake1 tablet (20 mg) once daily. May take extra 20 mg as needed for weight gain or swelling.   Potassium 10 mEqtake2 tablets daily  Labs:  Pt was due to have labs drawn at [facility] on week of 07/09/2020 but no indication that labs were done. 05/02/2020 Creatinine 1.04, BUN 22, Potassium 3.7, Sodium 140, GFR >60 03/16/2020 Creatinine 1.26, BUN 32, Potassium 3.9, Sodium 142, GFR 49 03/02/2020 Creatinine 1.55, BUN 28, Potassium 4.7, Sodium 143, GFR 39 A complete set of results can be found in Results Review.  Recommendations:Any recommendations will given at 07/17/2020 OV.   Follow-up plan: ICM clinic phone appointment on 07/17/2020.91 day device clinic remote transmissiondue7/12/2020.   EP/Cardiology Office Visits: 8/23/2022with Dr.Gollan.Recall for 10/24/2020 with Dr Caryl Comes.  Copy of ICM check sent to Dr.Klein and Dr Rockey Situ since patient has OV today, 07/17/2020.   3 month ICM trend: 5/223/2022.    1 Year ICM trend:       [name], RN 07/17/2020 3:00 PM
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
## 2020-07-18 LAB — BASIC METABOLIC PANEL
BUN/Creatinine Ratio: 13 (ref 9–23)
BUN: 21 mg/dL (ref 6–24)
CO2: 24 mmol/L (ref 20–29)
Calcium: 9.8 mg/dL (ref 8.7–10.2)
Chloride: 102 mmol/L (ref 96–106)
Creatinine, Ser: 1.62 mg/dL — ABNORMAL HIGH (ref 0.57–1.00)
Glucose: 85 mg/dL (ref 65–99)
Potassium: 4.3 mmol/L (ref 3.5–5.2)
Sodium: 141 mmol/L (ref 134–144)
eGFR: 36 mL/min/{1.73_m2} — ABNORMAL LOW (ref >59)

## 2020-07-19 NOTE — Progress Notes (Signed)
Remote ICD transmission.   

## 2020-07-25 ENCOUNTER — Telehealth: Payer: Self-pay

## 2020-07-25 NOTE — Telephone Encounter (Signed)
Left detail message on VM of pt's recent results okay by DPR, Dr. Rockey Situ advised on recent lab work  "Lab work reviewed  Looks a little bit dehydrated based on climbing creatinine  Could skip a day or 2 of the Lasix then resume"  Results also posted to pt's MyChart for review, advised to call back with any questions or concerns.

## 2020-07-26 ENCOUNTER — Other Ambulatory Visit: Payer: Self-pay | Admitting: Internal Medicine

## 2020-08-02 ENCOUNTER — Ambulatory Visit (INDEPENDENT_AMBULATORY_CARE_PROVIDER_SITE_OTHER): Payer: BC Managed Care – PPO

## 2020-08-02 DIAGNOSIS — I5032 Chronic diastolic (congestive) heart failure: Secondary | ICD-10-CM

## 2020-08-02 DIAGNOSIS — I420 Dilated cardiomyopathy: Secondary | ICD-10-CM

## 2020-08-03 LAB — CUP PACEART REMOTE DEVICE CHECK
Battery Remaining Longevity: 12 mo
Battery Voltage: 2.86 V
Brady Statistic AP VP Percent: 0.02 %
Brady Statistic AP VS Percent: 0.01 %
Brady Statistic AS VP Percent: 98.66 %
Brady Statistic AS VS Percent: 1.3 %
Brady Statistic RA Percent Paced: 0.03 %
Brady Statistic RV Percent Paced: 1.15 %
Date Time Interrogation Session: 20220610114638
HighPow Impedance: 83 Ohm
Implantable Lead Implant Date: 20160421
Implantable Lead Implant Date: 20160421
Implantable Lead Implant Date: 20161006
Implantable Lead Implant Date: 20161006
Implantable Lead Location: 753858
Implantable Lead Location: 753858
Implantable Lead Location: 753859
Implantable Lead Location: 753860
Implantable Lead Model: 5071
Implantable Lead Model: 5071
Implantable Lead Model: 5076
Implantable Pulse Generator Implant Date: 20160421
Lead Channel Impedance Value: 323 Ohm
Lead Channel Impedance Value: 342 Ohm
Lead Channel Impedance Value: 380 Ohm
Lead Channel Impedance Value: 399 Ohm
Lead Channel Impedance Value: 4047 Ohm
Lead Channel Impedance Value: 4047 Ohm
Lead Channel Pacing Threshold Amplitude: 0.5 V
Lead Channel Pacing Threshold Amplitude: 1.5 V
Lead Channel Pacing Threshold Pulse Width: 0.4 ms
Lead Channel Pacing Threshold Pulse Width: 0.4 ms
Lead Channel Sensing Intrinsic Amplitude: 23 mV
Lead Channel Sensing Intrinsic Amplitude: 23 mV
Lead Channel Sensing Intrinsic Amplitude: 3.375 mV
Lead Channel Sensing Intrinsic Amplitude: 3.375 mV
Lead Channel Setting Pacing Amplitude: 1.5 V
Lead Channel Setting Pacing Amplitude: 2 V
Lead Channel Setting Pacing Amplitude: 2.5 V
Lead Channel Setting Pacing Pulse Width: 0.4 ms
Lead Channel Setting Pacing Pulse Width: 0.4 ms
Lead Channel Setting Sensing Sensitivity: 0.3 mV

## 2020-08-14 ENCOUNTER — Other Ambulatory Visit: Payer: Self-pay | Admitting: Internal Medicine

## 2020-08-23 ENCOUNTER — Other Ambulatory Visit: Payer: Self-pay | Admitting: Internal Medicine

## 2020-08-23 NOTE — Telephone Encounter (Signed)
ok to send in? Last injection was 3 months ago.

## 2020-08-24 NOTE — Progress Notes (Signed)
No ICM remote transmission received for 08/20/2020 and next ICM transmission scheduled for 09/24/2020.

## 2020-08-24 NOTE — Addendum Note (Signed)
Addended by: Douglass Rivers D on: 08/24/2020 12:33 PM   Modules accepted: Level of Service

## 2020-08-24 NOTE — Progress Notes (Signed)
Remote ICD transmission.   

## 2020-08-30 NOTE — Telephone Encounter (Signed)
Records have been shredded as pt did not cal to sch in 3 yrs.

## 2020-09-03 ENCOUNTER — Ambulatory Visit (INDEPENDENT_AMBULATORY_CARE_PROVIDER_SITE_OTHER): Payer: BC Managed Care – PPO

## 2020-09-03 DIAGNOSIS — I420 Dilated cardiomyopathy: Secondary | ICD-10-CM | POA: Diagnosis not present

## 2020-09-03 LAB — CUP PACEART REMOTE DEVICE CHECK
Battery Remaining Longevity: 8 mo
Battery Voltage: 2.86 V
Brady Statistic AP VP Percent: 0.02 %
Brady Statistic AP VS Percent: 0.01 %
Brady Statistic AS VP Percent: 98.57 %
Brady Statistic AS VS Percent: 1.4 %
Brady Statistic RA Percent Paced: 0.03 %
Brady Statistic RV Percent Paced: 2.76 %
Date Time Interrogation Session: 20220711043822
HighPow Impedance: 78 Ohm
Implantable Lead Implant Date: 20160421
Implantable Lead Implant Date: 20160421
Implantable Lead Implant Date: 20161006
Implantable Lead Implant Date: 20161006
Implantable Lead Location: 753858
Implantable Lead Location: 753858
Implantable Lead Location: 753859
Implantable Lead Location: 753860
Implantable Lead Model: 5071
Implantable Lead Model: 5071
Implantable Lead Model: 5076
Implantable Pulse Generator Implant Date: 20160421
Lead Channel Impedance Value: 323 Ohm
Lead Channel Impedance Value: 323 Ohm
Lead Channel Impedance Value: 380 Ohm
Lead Channel Impedance Value: 380 Ohm
Lead Channel Impedance Value: 4047 Ohm
Lead Channel Impedance Value: 4047 Ohm
Lead Channel Pacing Threshold Amplitude: 0.5 V
Lead Channel Pacing Threshold Amplitude: 1.5 V
Lead Channel Pacing Threshold Pulse Width: 0.4 ms
Lead Channel Pacing Threshold Pulse Width: 0.4 ms
Lead Channel Sensing Intrinsic Amplitude: 2.625 mV
Lead Channel Sensing Intrinsic Amplitude: 2.625 mV
Lead Channel Sensing Intrinsic Amplitude: 21.5 mV
Lead Channel Sensing Intrinsic Amplitude: 21.5 mV
Lead Channel Setting Pacing Amplitude: 1.5 V
Lead Channel Setting Pacing Amplitude: 2 V
Lead Channel Setting Pacing Amplitude: 2.5 V
Lead Channel Setting Pacing Pulse Width: 0.4 ms
Lead Channel Setting Pacing Pulse Width: 0.4 ms
Lead Channel Setting Sensing Sensitivity: 0.3 mV

## 2020-09-04 ENCOUNTER — Ambulatory Visit (INDEPENDENT_AMBULATORY_CARE_PROVIDER_SITE_OTHER): Payer: BC Managed Care – PPO

## 2020-09-04 DIAGNOSIS — Z95810 Presence of automatic (implantable) cardiac defibrillator: Secondary | ICD-10-CM | POA: Diagnosis not present

## 2020-09-04 DIAGNOSIS — I5032 Chronic diastolic (congestive) heart failure: Secondary | ICD-10-CM | POA: Diagnosis not present

## 2020-09-06 ENCOUNTER — Other Ambulatory Visit: Payer: Self-pay | Admitting: Internal Medicine

## 2020-09-06 NOTE — Progress Notes (Signed)
EPIC Encounter for ICM Monitoring  Patient Name: Suzanne Spears is a 60 y.o. female Date: 09/06/2020 Primary Care Physican: [name], MD Primary Cardiologist: Rockey Situ Electrophysiologist: Vergie Living Pacing:  98.3%    09/06/2020 Weight: 155 lbs (baseline 150 lbs)           Spoke with patient and heart failure questions reviewed.  Pt asymptomatic for fluid accumulation and feeing well.   OptiVol Thoracic impedance suggesting fluid levels continue to frequently alternate between possible fluid accumulation and baseline normal.    Prescribed:  Furosemide 20 mg Take1 tablet (20 mg) once daily. May take extra 20 mg as needed for weight gain or swelling. Potassium 10 mEq take 2 tablets daily   Labs:   05/02/2020 Creatinine 1.04, BUN 22, Potassium 3.7, Sodium 140, GFR >60  03/16/2020 Creatinine 1.26, BUN 32, Potassium 3.9, Sodium 142, GFR 49 03/02/2020 Creatinine 1.55, BUN 28, Potassium 4.7, Sodium 143, GFR 39 A complete set of results can be found in Results Review.   Recommendations: No changes and encouraged to call if experiencing any fluid symptoms.   Follow-up plan: ICM clinic phone appointment on 10/08/2020.  91 day device clinic remote transmission due 11/05/2020.     EP/Cardiology Office Visits:  10/16/2020 with Dr. Rockey Situ.  10/16/2020 with Dr Caryl Comes.     Copy of ICM check sent to Dr. Caryl Comes.   3 month ICM trend: 09/03/2020.    1 Year ICM trend:       [name], RN 09/06/2020 9:00 AM

## 2020-09-11 ENCOUNTER — Other Ambulatory Visit: Payer: Self-pay | Admitting: Internal Medicine

## 2020-09-22 IMAGING — CT CT HEAD W/O CM
3 series · 16 of 44 positions shown, 19 images · non-contrast
Comparison: 03/05/2018

CLINICAL DATA: Posterior headaches, history of frontal migraines

EXAM:
CT HEAD WITHOUT CONTRAST
TECHNIQUE: Contiguous axial images were obtained from the base of the skull
through the vertex without intravenous contrast.

[Series 2: head wo · axial · 0.47mm/px · z∈[-133,-23]mm · 10 of 27 slices shown, 13 images]
[im 3/27  brain]
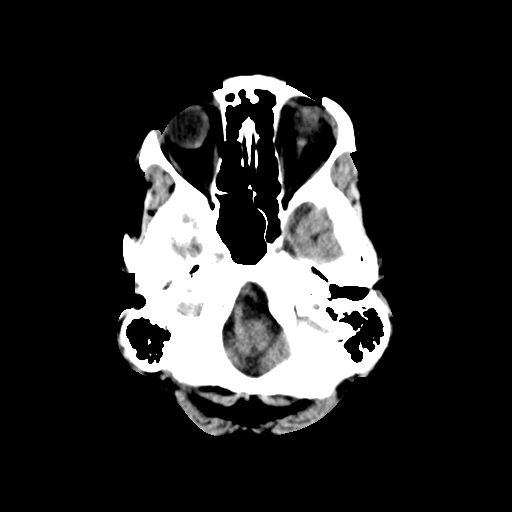
[im 3/27  bone]
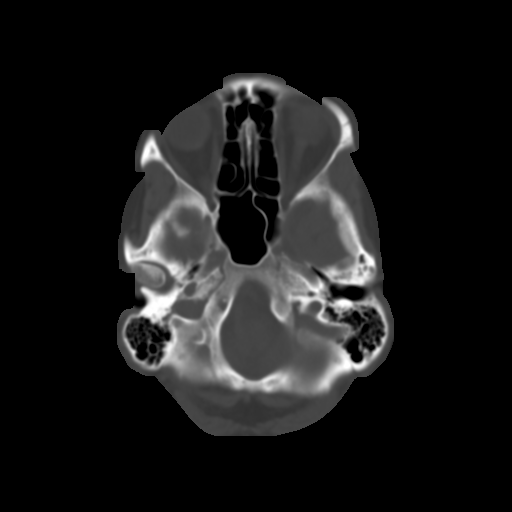
[im 5/27  brain]
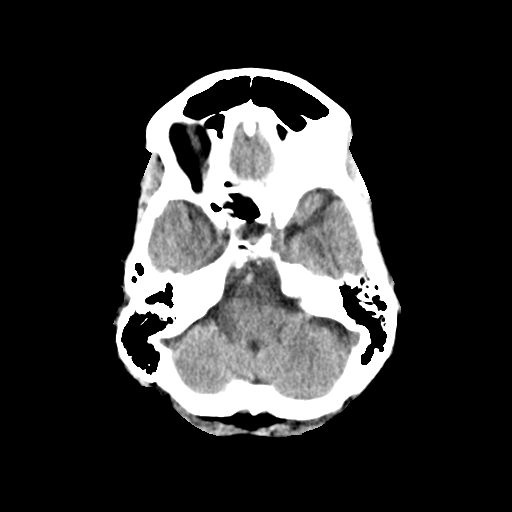
[im 8/27  brain]
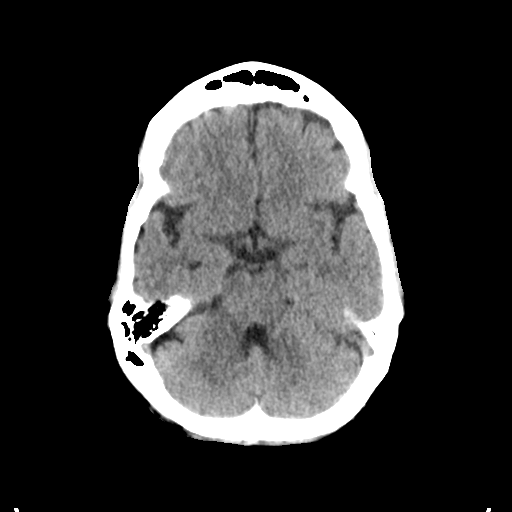
[im 10/27  brain]
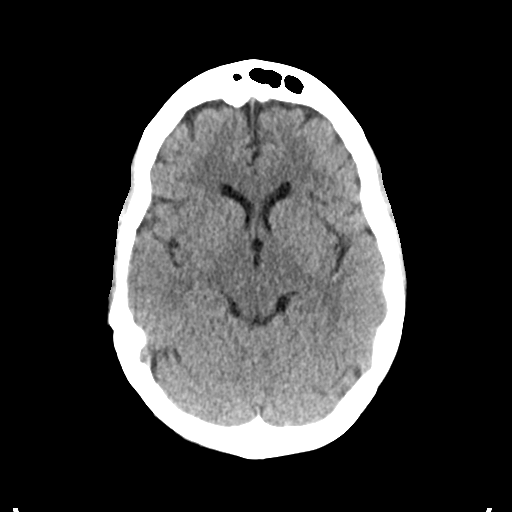
[im 13/27  brain]
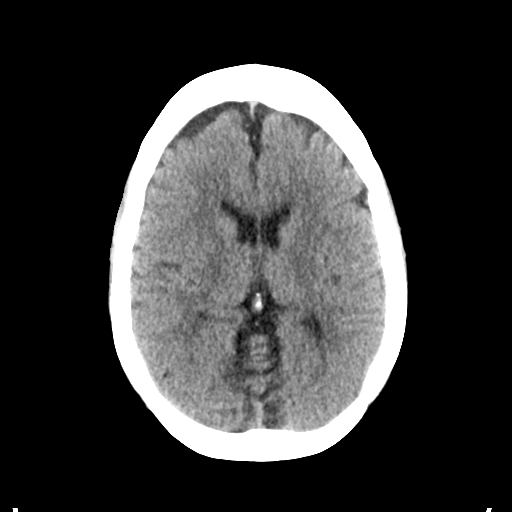
[im 13/27  bone]
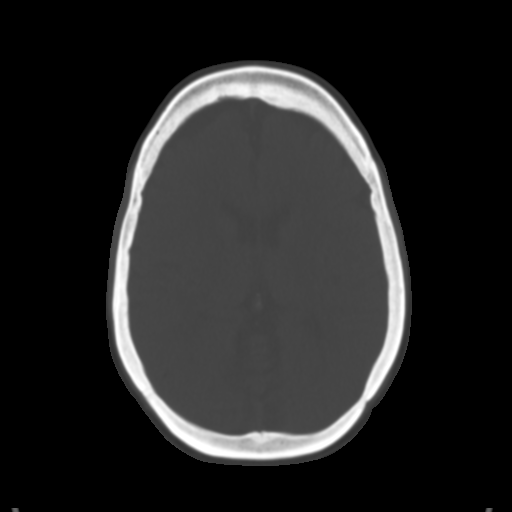
[im 15/27  brain]
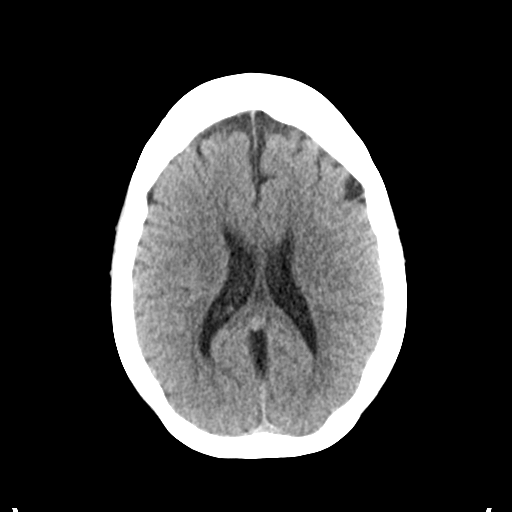
[im 18/27  brain]
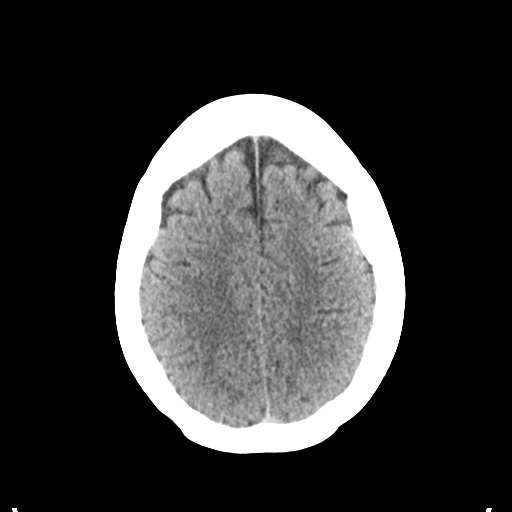
[im 20/27  brain]
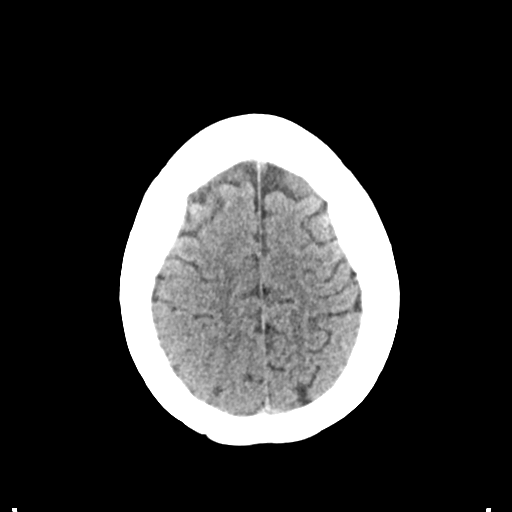
[im 23/27  brain]
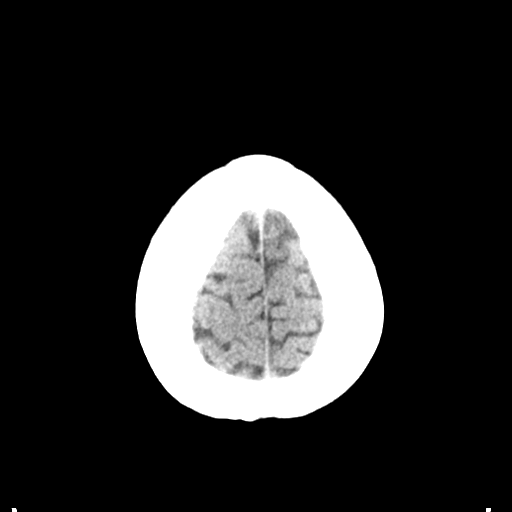
[im 23/27  bone]
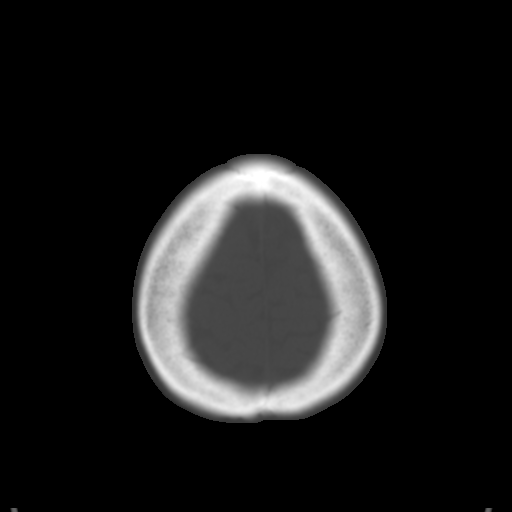
[im 25/27  brain]
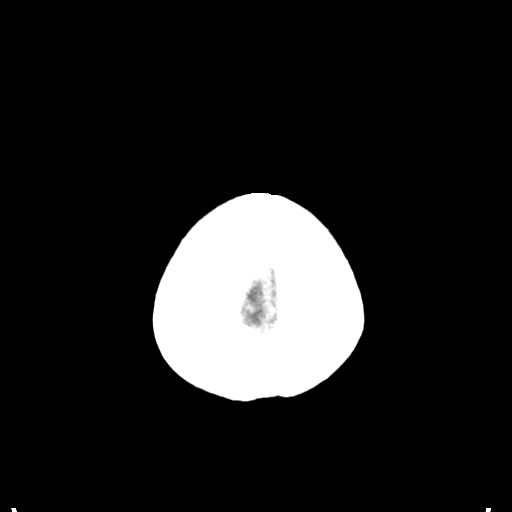

[Series 4: coronal soft tissue · coronal · 0.29mm/px · 3 of 64 slices shown]
[im 22/64  brain]
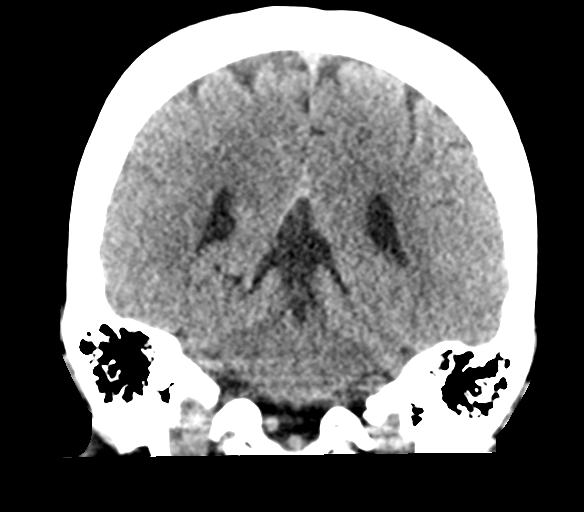
[im 29/64  brain]
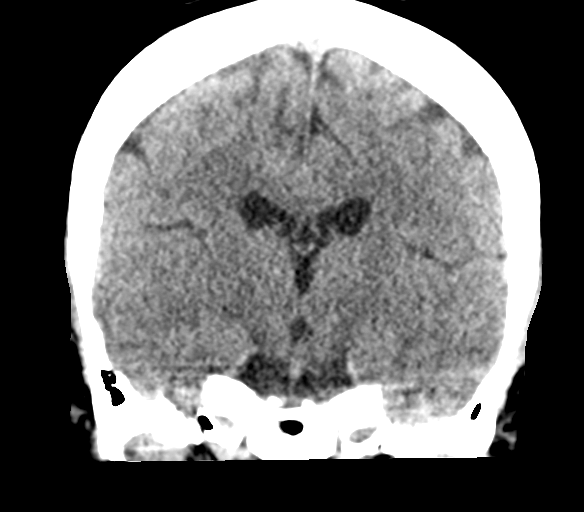
[im 36/64  brain]
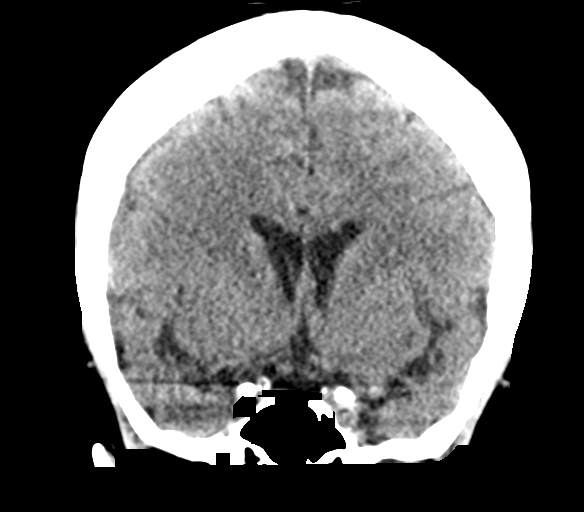

[Series 5: sagittal soft tissue · sagittal · 0.29mm/px · 3 of 57 slices shown]
[im 19/57  brain]
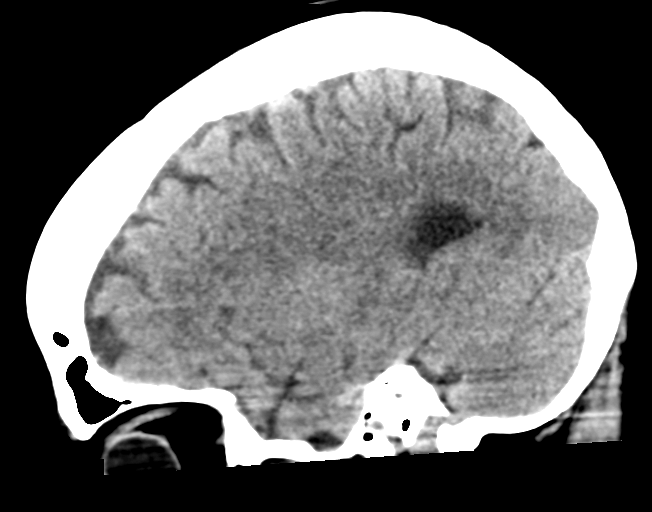
[im 29/57  brain]
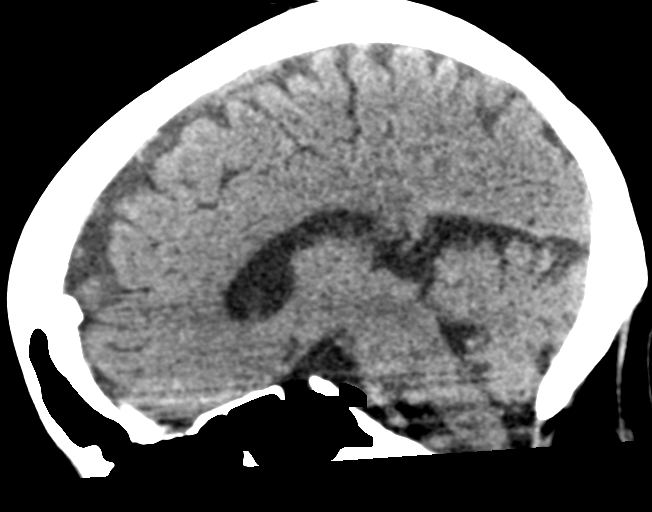
[im 38/57  brain]
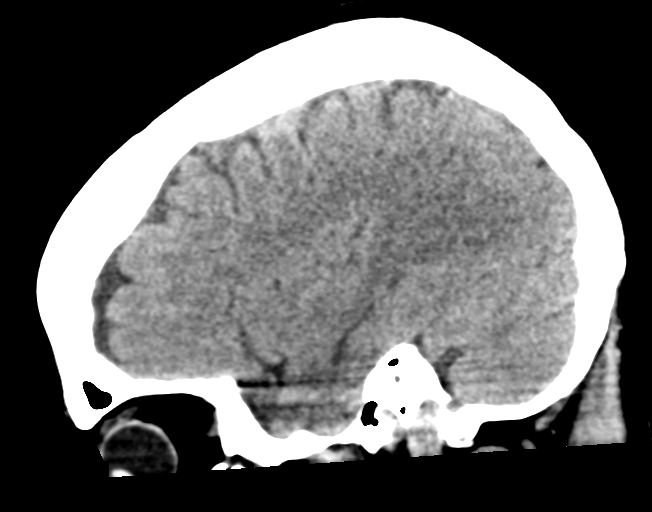

[16 of 44 positions shown; findings below may reference images not displayed]

FINDINGS: Brain: There is no acute intracranial hemorrhage, mass effect, or
edema. Gray-white differentiation is preserved. There is no
extra-axial fluid collection. Ventricles and sulci are within normal
limits in size and configuration.

Vascular: No hyperdense vessel or unexpected calcification.

Skull: Calvarium is unremarkable.

Sinuses/Orbits: No acute finding.

Other: None.
IMPRESSION: No acute intracranial hemorrhage, mass effect, or evidence of acute
infarction.

## 2020-09-24 NOTE — Progress Notes (Signed)
Remote ICD transmission.   

## 2020-10-04 ENCOUNTER — Ambulatory Visit (INDEPENDENT_AMBULATORY_CARE_PROVIDER_SITE_OTHER): Payer: BC Managed Care – PPO

## 2020-10-04 ENCOUNTER — Other Ambulatory Visit: Payer: Self-pay | Admitting: Internal Medicine

## 2020-10-04 ENCOUNTER — Other Ambulatory Visit: Payer: Self-pay

## 2020-10-04 DIAGNOSIS — I5032 Chronic diastolic (congestive) heart failure: Secondary | ICD-10-CM

## 2020-10-04 LAB — CUP PACEART REMOTE DEVICE CHECK
Battery Remaining Longevity: 9 mo
Battery Voltage: 2.85 V
Brady Statistic AP VP Percent: 0.02 %
Brady Statistic AP VS Percent: 0.01 %
Brady Statistic AS VP Percent: 98.61 %
Brady Statistic AS VS Percent: 1.35 %
Brady Statistic RA Percent Paced: 0.03 %
Brady Statistic RV Percent Paced: 2.69 %
Date Time Interrogation Session: 20220811012303
HighPow Impedance: 86 Ohm
Implantable Lead Implant Date: 20160421
Implantable Lead Implant Date: 20160421
Implantable Lead Implant Date: 20161006
Implantable Lead Implant Date: 20161006
Implantable Lead Location: 753858
Implantable Lead Location: 753858
Implantable Lead Location: 753859
Implantable Lead Location: 753860
Implantable Lead Model: 5071
Implantable Lead Model: 5071
Implantable Lead Model: 5076
Implantable Pulse Generator Implant Date: 20160421
Lead Channel Impedance Value: 323 Ohm
Lead Channel Impedance Value: 323 Ohm
Lead Channel Impedance Value: 380 Ohm
Lead Channel Impedance Value: 380 Ohm
Lead Channel Impedance Value: 4047 Ohm
Lead Channel Impedance Value: 4047 Ohm
Lead Channel Pacing Threshold Amplitude: 0.5 V
Lead Channel Pacing Threshold Amplitude: 1.375 V
Lead Channel Pacing Threshold Pulse Width: 0.4 ms
Lead Channel Pacing Threshold Pulse Width: 0.4 ms
Lead Channel Sensing Intrinsic Amplitude: 2.75 mV
Lead Channel Sensing Intrinsic Amplitude: 2.75 mV
Lead Channel Sensing Intrinsic Amplitude: 22.875 mV
Lead Channel Sensing Intrinsic Amplitude: 22.875 mV
Lead Channel Setting Pacing Amplitude: 1.5 V
Lead Channel Setting Pacing Amplitude: 2 V
Lead Channel Setting Pacing Amplitude: 2.5 V
Lead Channel Setting Pacing Pulse Width: 0.4 ms
Lead Channel Setting Pacing Pulse Width: 0.4 ms
Lead Channel Setting Sensing Sensitivity: 0.3 mV

## 2020-10-04 MED ORDER — ENTRESTO 24-26 MG PO TABS: 0.5000 | ORAL_TABLET | Freq: Two times a day (BID) | ORAL | 0 refills | Status: DC

## 2020-10-08 ENCOUNTER — Ambulatory Visit (INDEPENDENT_AMBULATORY_CARE_PROVIDER_SITE_OTHER): Payer: BC Managed Care – PPO

## 2020-10-08 DIAGNOSIS — Z95810 Presence of automatic (implantable) cardiac defibrillator: Secondary | ICD-10-CM | POA: Diagnosis not present

## 2020-10-08 DIAGNOSIS — I5032 Chronic diastolic (congestive) heart failure: Secondary | ICD-10-CM

## 2020-10-09 ENCOUNTER — Telehealth: Payer: Self-pay

## 2020-10-09 NOTE — Progress Notes (Signed)
EPIC Encounter for ICM Monitoring  Patient Name: Suzanne Spears is a 60 y.o. female Date: 10/09/2020 Primary Care Physican: [name], MD Primary Cardiologist: Rockey Situ Electrophysiologist: Vergie Living Pacing:  98.6%    09/06/2020 Weight: 155 lbs (baseline 150 lbs)           Attempted call to patient and unable to reach.  Left detailed message per DPR regarding transmission. Transmission reviewed.    OptiVol Thoracic impedance suggesting possible fluid accumulation since 8/11.    Prescribed:  Furosemide 20 mg Take1 tablet (20 mg) once daily. May take extra 20 mg as needed for weight gain or swelling. Potassium 10 mEq take 2 tablets daily   Labs:   07/17/2020 Creatinine 1.62, BUN 21, Potassium 4.3, Sodium 141, GFR 36 05/02/2020 Creatinine 1.04, BUN 22, Potassium 3.7, Sodium 140, GFR >60  03/16/2020 Creatinine 1.26, BUN 32, Potassium 3.9, Sodium 142, GFR 49 03/02/2020 Creatinine 1.55, BUN 28, Potassium 4.7, Sodium 143, GFR 39 A complete set of results can be found in Results Review.   Recommendations: Left voice mail with ICM number and encouraged to call if experiencing any fluid symptoms.   Follow-up plan: ICM clinic phone appointment on 11/12/2020.  91 day device clinic remote transmission due 11/05/2020.     EP/Cardiology Office Visits:  01/14/2021 with Dr. Rockey Situ.  10/23/2020 with Dr Caryl Comes.     Copy of ICM check sent to Dr. Caryl Comes.   3 month ICM trend: 10/08/2020.    1 Year ICM trend:       [name], RN 10/09/2020 12:41 PM

## 2020-10-09 NOTE — Telephone Encounter (Signed)
Remote ICM transmission received.  Attempted call to patient regarding ICM remote transmission and left detailed message per DPR.  Advised to return call for any fluid symptoms or questions.  

## 2020-10-16 ENCOUNTER — Encounter: Payer: BC Managed Care – PPO | Admitting: Internal Medicine

## 2020-10-16 ENCOUNTER — Ambulatory Visit: Payer: BC Managed Care – PPO | Admitting: Cardiovascular Disease
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
## 2020-10-23 ENCOUNTER — Other Ambulatory Visit: Payer: Self-pay

## 2020-10-23 ENCOUNTER — Ambulatory Visit: Payer: BC Managed Care – PPO | Admitting: Internal Medicine

## 2020-10-23 ENCOUNTER — Encounter: Payer: Self-pay | Admitting: Internal Medicine

## 2020-10-23 VITALS — BP 110/80 | HR 81 | Ht 65.0 in | Wt 159.0 lb

## 2020-10-23 DIAGNOSIS — I420 Dilated cardiomyopathy: Secondary | ICD-10-CM | POA: Diagnosis not present

## 2020-10-23 DIAGNOSIS — I5032 Chronic diastolic (congestive) heart failure: Secondary | ICD-10-CM

## 2020-10-23 DIAGNOSIS — Z95810 Presence of automatic (implantable) cardiac defibrillator: Secondary | ICD-10-CM | POA: Diagnosis not present

## 2020-10-23 NOTE — Patient Instructions (Signed)
Medication Instructions:  - Your physician recommends that you continue on your current medications as directed. Please refer to the Current Medication list given to you today.  *If you need a refill on your cardiac medications before your next appointment, please call your pharmacy*   Lab Work: - none ordered  If you have labs (blood work) drawn today and your tests are completely normal, you will receive your results only by: [place] (if you have MyChart) OR A paper copy in the mail If you have any lab test that is abnormal or we need to change your treatment, we will call you to review the results.   Testing/Procedures: - none ordered   Follow-Up: At [facility], you and your health needs are our priority.  As part of our continuing mission to provide you with exceptional heart care, we have created designated Provider Care Teams.  These Care Teams include your primary Cardiologist (physician) and Advanced Practice Providers (APPs -  Physician Assistants and Nurse Practitioners) who all work together to provide you with the care you need, when you need it.  We recommend signing up for the patient portal called "MyChart".  Sign up information is provided on this After Visit Summary.  MyChart is used to connect with patients for Virtual Visits (Telemedicine).  Patients are able to view lab/test results, encounter notes, upcoming appointments, etc.  Non-urgent messages can be sent to your provider as well.   To learn more about what you can do with MyChart, go to [email or web address].    Your next appointment:   9 month(s)  The format for your next appointment:   In Person  Provider:   Virl Axe, MD   Other Instructions N/a

## 2020-10-25 NOTE — Progress Notes (Signed)
Remote ICD transmission.   

## 2020-10-31 ENCOUNTER — Telehealth: Payer: Self-pay

## 2020-10-31 ENCOUNTER — Other Ambulatory Visit: Payer: Self-pay | Admitting: *Deleted

## 2020-10-31 DIAGNOSIS — N289 Disorder of kidney and ureter, unspecified: Secondary | ICD-10-CM

## 2020-10-31 NOTE — Telephone Encounter (Signed)
-----   Message from Emily Filbert, RN sent at 10/31/2020 11:18 AM EDT ----- This patient of SK's needs to come back in for a BMP. She is aware and would like to come this week.  Please call to arrange- many thanks!!  BMP order placed.

## 2020-10-31 NOTE — Telephone Encounter (Signed)
Left message to call and schedule.

## 2020-11-05 ENCOUNTER — Ambulatory Visit (INDEPENDENT_AMBULATORY_CARE_PROVIDER_SITE_OTHER): Payer: BC Managed Care – PPO

## 2020-11-05 DIAGNOSIS — I255 Ischemic cardiomyopathy: Secondary | ICD-10-CM

## 2020-11-05 NOTE — Telephone Encounter (Signed)
Attempted to schedule.  

## 2020-11-05 NOTE — Telephone Encounter (Signed)
Order changed.

## 2020-11-05 NOTE — Telephone Encounter (Signed)
[facility] due to [facility].  Please change order she will go tonight .

## 2020-11-06 ENCOUNTER — Other Ambulatory Visit
Admission: RE | Admit: 2020-11-06 | Discharge: 2020-11-06 | Disposition: A | Discharge status: Home / Self Care | Payer: BC Managed Care – PPO | Attending: Internal Medicine | Admitting: Internal Medicine

## 2020-11-06 DIAGNOSIS — N289 Disorder of kidney and ureter, unspecified: Secondary | ICD-10-CM

## 2020-11-06 LAB — BASIC METABOLIC PANEL
Anion gap: 7 (ref 5–15)
BUN: 20 mg/dL (ref 6–20)
CO2: 30 mmol/L (ref 22–32)
Calcium: 9.4 mg/dL (ref 8.9–10.3)
Chloride: 101 mmol/L (ref 98–111)
Creatinine, Ser: 1.15 mg/dL — ABNORMAL HIGH (ref 0.44–1.00)
GFR, Estimated: 55 mL/min — ABNORMAL LOW (ref >60)
Glucose, Bld: 100 mg/dL — ABNORMAL HIGH (ref 70–99)
Potassium: 3.8 mmol/L (ref 3.5–5.1)
Sodium: 138 mmol/L (ref 135–145)

## 2020-11-07 LAB — CUP PACEART REMOTE DEVICE CHECK
Battery Remaining Longevity: 8 mo
Battery Voltage: 2.8 V
Brady Statistic AP VP Percent: 3.71 %
Brady Statistic AP VS Percent: 0.14 %
Brady Statistic AS VP Percent: 94.9 %
Brady Statistic AS VS Percent: 1.24 %
Brady Statistic RA Percent Paced: 3.85 %
Brady Statistic RV Percent Paced: 3.65 %
Date Time Interrogation Session: 20220912012503
HighPow Impedance: 80 Ohm
Implantable Lead Implant Date: 20160421
Implantable Lead Implant Date: 20160421
Implantable Lead Implant Date: 20161006
Implantable Lead Implant Date: 20161006
Implantable Lead Location: 753858
Implantable Lead Location: 753858
Implantable Lead Location: 753859
Implantable Lead Location: 753860
Implantable Lead Model: 5071
Implantable Lead Model: 5071
Implantable Lead Model: 5076
Implantable Pulse Generator Implant Date: 20160421
Lead Channel Impedance Value: 323 Ohm
Lead Channel Impedance Value: 323 Ohm
Lead Channel Impedance Value: 399 Ohm
Lead Channel Impedance Value: 399 Ohm
Lead Channel Impedance Value: 4047 Ohm
Lead Channel Impedance Value: 4047 Ohm
Lead Channel Pacing Threshold Amplitude: 0.5 V
Lead Channel Pacing Threshold Amplitude: 1.375 V
Lead Channel Pacing Threshold Pulse Width: 0.4 ms
Lead Channel Pacing Threshold Pulse Width: 0.4 ms
Lead Channel Sensing Intrinsic Amplitude: 23.75 mV
Lead Channel Sensing Intrinsic Amplitude: 23.75 mV
Lead Channel Sensing Intrinsic Amplitude: 3.5 mV
Lead Channel Sensing Intrinsic Amplitude: 3.5 mV
Lead Channel Setting Pacing Amplitude: 1.5 V
Lead Channel Setting Pacing Amplitude: 2 V
Lead Channel Setting Pacing Amplitude: 2.5 V
Lead Channel Setting Pacing Pulse Width: 0.4 ms
Lead Channel Setting Pacing Pulse Width: 0.4 ms
Lead Channel Setting Sensing Sensitivity: 0.3 mV

## 2020-11-09 NOTE — Addendum Note (Signed)
Addended by: Carylon Perches on: 11/09/2020 11:18 AM   Modules accepted: Level of Service

## 2020-11-09 NOTE — Progress Notes (Signed)
Remote ICD transmission.   

## 2020-11-12 ENCOUNTER — Ambulatory Visit (INDEPENDENT_AMBULATORY_CARE_PROVIDER_SITE_OTHER): Payer: BC Managed Care – PPO

## 2020-11-12 DIAGNOSIS — I5032 Chronic diastolic (congestive) heart failure: Secondary | ICD-10-CM | POA: Diagnosis not present

## 2020-11-12 DIAGNOSIS — Z95810 Presence of automatic (implantable) cardiac defibrillator: Secondary | ICD-10-CM | POA: Diagnosis not present

## 2020-11-13 NOTE — Progress Notes (Signed)
EPIC Encounter for ICM Monitoring  Patient Name: Suzanne Spears is a 60 y.o. female Date: 11/13/2020 Primary Care Physican: [name], MD Primary Cardiologist: Rockey Situ Electrophysiologist: Vergie Living Pacing:  98.5%    09/06/2020 Weight: 155 lbs (baseline 150 lbs)  Battery Longevity: 7 months            Spoke with patient and heart failure questions reviewed.  Pt asymptomatic for fluid accumulation and feeling well.   OptiVol Thoracic impedance suggesting flucutatings on fluid levels for the last month but trending close to normal.    Prescribed:  Furosemide 20 mg Take1 tablet (20 mg) once daily. May take extra 20 mg as needed for weight gain or swelling. Potassium 10 mEq take 2 tablets daily   Labs:   11/06/2020 Creatinine 1.15, BUN 20, Potassuim 3.8, Sodium 138, GFR 55 07/17/2020 Creatinine 1.62, BUN 21, Potassium 4.3, Sodium 141, GFR 36 05/02/2020 Creatinine 1.04, BUN 22, Potassium 3.7, Sodium 140, GFR >60  03/16/2020 Creatinine 1.26, BUN 32, Potassium 3.9, Sodium 142, GFR 49 03/02/2020 Creatinine 1.55, BUN 28, Potassium 4.7, Sodium 143, GFR 39 A complete set of results can be found in Results Review.   Recommendations: No changes and encouraged to call if experiencing any fluid symptoms.   Follow-up plan: ICM clinic phone appointment on 12/17/2020.  91 day device clinic remote transmission due 12/06/2020.     EP/Cardiology Office Visits:  01/14/2021 with Dr. Rockey Situ.    Copy of ICM check sent to Dr. Caryl Comes.    3 month ICM trend: 11/12/2020.    1 Year ICM trend:       [name], RN 11/13/2020 4:08 PM

## 2020-11-14 ENCOUNTER — Telehealth: Payer: Self-pay | Admitting: Internal Medicine

## 2020-11-14 NOTE — Telephone Encounter (Signed)
Patient informed, Due to the high volume of calls and your symptoms we have to forward your call to our Triage Nurse to expedient your call. Please hold for the transfer.  Patient transferred to [place] at Access Nurse. Due to having taken a COVID test this morning that was negative but she is having a cough and joint pain that has caused her trouble sleeping.No openings in office or virtual.

## 2020-11-14 NOTE — Telephone Encounter (Signed)
Access nurse instructed patient on home care advise.

## 2020-11-14 NOTE — Telephone Encounter (Signed)
Patient has been informed.

## 2020-11-30 ENCOUNTER — Other Ambulatory Visit: Payer: Self-pay | Admitting: Internal Medicine

## 2020-11-30 DIAGNOSIS — R921 Mammographic calcification found on diagnostic imaging of breast: Secondary | ICD-10-CM

## 2020-11-30 DIAGNOSIS — R928 Other abnormal and inconclusive findings on diagnostic imaging of breast: Secondary | ICD-10-CM

## 2020-11-30 DIAGNOSIS — Z1231 Encounter for screening mammogram for malignant neoplasm of breast: Secondary | ICD-10-CM

## 2020-12-06 ENCOUNTER — Other Ambulatory Visit: Payer: Self-pay | Admitting: Nurse Practitioner

## 2020-12-06 ENCOUNTER — Ambulatory Visit (INDEPENDENT_AMBULATORY_CARE_PROVIDER_SITE_OTHER): Payer: BC Managed Care – PPO

## 2020-12-06 DIAGNOSIS — I420 Dilated cardiomyopathy: Secondary | ICD-10-CM

## 2020-12-06 LAB — CUP PACEART REMOTE DEVICE CHECK
Battery Remaining Longevity: 6 mo
Battery Voltage: 2.83 V
Brady Statistic AP VP Percent: 2.69 %
Brady Statistic AP VS Percent: 0.11 %
Brady Statistic AS VP Percent: 95.95 %
Brady Statistic AS VS Percent: 1.25 %
Brady Statistic RA Percent Paced: 2.8 %
Brady Statistic RV Percent Paced: 4.16 %
Date Time Interrogation Session: 20221013033523
HighPow Impedance: 76 Ohm
Implantable Lead Implant Date: 20160421
Implantable Lead Implant Date: 20160421
Implantable Lead Implant Date: 20161006
Implantable Lead Implant Date: 20161006
Implantable Lead Location: 753858
Implantable Lead Location: 753858
Implantable Lead Location: 753859
Implantable Lead Location: 753860
Implantable Lead Model: 5071
Implantable Lead Model: 5071
Implantable Lead Model: 5076
Implantable Pulse Generator Implant Date: 20160421
Lead Channel Impedance Value: 285 Ohm
Lead Channel Impedance Value: 323 Ohm
Lead Channel Impedance Value: 342 Ohm
Lead Channel Impedance Value: 380 Ohm
Lead Channel Impedance Value: 4047 Ohm
Lead Channel Impedance Value: 4047 Ohm
Lead Channel Pacing Threshold Amplitude: 0.5 V
Lead Channel Pacing Threshold Amplitude: 1.25 V
Lead Channel Pacing Threshold Pulse Width: 0.4 ms
Lead Channel Pacing Threshold Pulse Width: 0.4 ms
Lead Channel Sensing Intrinsic Amplitude: 2.875 mV
Lead Channel Sensing Intrinsic Amplitude: 2.875 mV
Lead Channel Sensing Intrinsic Amplitude: 25.375 mV
Lead Channel Sensing Intrinsic Amplitude: 25.375 mV
Lead Channel Setting Pacing Amplitude: 1.5 V
Lead Channel Setting Pacing Amplitude: 2 V
Lead Channel Setting Pacing Amplitude: 2.5 V
Lead Channel Setting Pacing Pulse Width: 0.4 ms
Lead Channel Setting Pacing Pulse Width: 0.4 ms
Lead Channel Setting Sensing Sensitivity: 0.3 mV

## 2020-12-06 NOTE — Telephone Encounter (Signed)
This is a [place] pt 

## 2020-12-14 NOTE — Progress Notes (Signed)
Remote ICD transmission.   

## 2020-12-17 ENCOUNTER — Ambulatory Visit (INDEPENDENT_AMBULATORY_CARE_PROVIDER_SITE_OTHER): Payer: BC Managed Care – PPO

## 2020-12-17 DIAGNOSIS — Z95810 Presence of automatic (implantable) cardiac defibrillator: Secondary | ICD-10-CM

## 2020-12-17 DIAGNOSIS — I5032 Chronic diastolic (congestive) heart failure: Secondary | ICD-10-CM | POA: Diagnosis not present

## 2020-12-18 ENCOUNTER — Other Ambulatory Visit: Payer: Self-pay

## 2020-12-18 ENCOUNTER — Encounter: Payer: Self-pay | Admitting: Internal Medicine

## 2020-12-18 ENCOUNTER — Ambulatory Visit: Payer: BC Managed Care – PPO | Admitting: Internal Medicine

## 2020-12-18 VITALS — BP 130/66 | HR 83 | Temp 97.1°F | Ht 65.5 in | Wt 158.8 lb

## 2020-12-18 DIAGNOSIS — J4520 Mild intermittent asthma, uncomplicated: Secondary | ICD-10-CM

## 2020-12-18 MED ORDER — ALBUTEROL SULFATE HFA 108 (90 BASE) MCG/ACT IN AERS: 2.0000 | INHALATION_SPRAY | Freq: Four times a day (QID) | RESPIRATORY_TRACT | 2 refills | Status: DC | PRN

## 2020-12-18 NOTE — Patient Instructions (Addendum)
Refill ALBUTEROL as needed   Avoid allergens Avoid secondhand smoke

## 2020-12-18 NOTE — Progress Notes (Signed)
Brief History: 60 year old female seen in consultation as transition of care from Dr. Raul Del office for mild to moderate asthma, previously saw BQ, then transferred to [facility], now back to [facility]. Medical history significant for left bundle branch block, mitral prolapse, dilated cardiomyopathy, asthma, hypertension, chronic systolic congestive heart failure, status post ICD placement, hx of pneumonia, which are all stable. Work allergens may aggravate cough, follow with ENT for nasal septum issues.  Marland Kitchen  PFT's are essentially normal with moderate decrease in FEF 25-75 62% (FEV1 80%, FEV1/FVC 72%) Normal 51mwt - no desats, walked 1117ft/360m   07/09/15 PFTs - FEV1 80, FEV1/FVC 72, FEF 25-70 562, DLCO 91%. No significant obstruction,recurrent bronchodilator response, DLCO uncorrected within normal limits, normal curves. 6 minute walk test-no significant desaturations, lowest desaturation 96%, 1181 feet, 360 m     CC Follow up ASTHMA   History of Present Illness: Follow-up mild intermittent asthma  Patient has not used her inhaled steroids over the last several years  Non-smoker   Controlled with avoidance of allergens and triggers Triggers include Perfumes, cold AIR, pollen,grass Works as Educational psychologist to paint and flooring(remodeling)  Infrequent albuterol use  No exacerbation at this time No evidence of heart failure at this time No evidence or signs of infection at this time No respiratory distress No fevers, chills, nausea, vomiting, diarrhea No evidence of lower extremity edema No evidence hemoptysis   I have discussed SLeep study repoprts-No evidence of OSA Patient still with daytime sleepiness most likely related to antipsychotic meds Also has untreated GERD symptoms-on PPI   Has significant h/o Cardiomyopathy sees Dr. Caryl Comes No lower ext swelling No signs of infection at this time Takes lasix as needed EF 25%  Her allergic rhinitis does seem to  be under control with Zyrtec and Flonase      Review of Systems:  Gen:  Denies  fever, sweats, chills weight loss  HEENT: Denies blurred vision, double vision, ear pain, eye pain, hearing loss, nose bleeds, sore throat Cardiac:  No dizziness, chest pain or heaviness, chest tightness,edema, No JVD Resp:   No cough, -sputum production, -shortness of breath,-wheezing, -hemoptysis,  Other:  All other systems negative   BP 130/66 (BP Location: Right Arm, Patient Position: Sitting, Cuff Size: Normal)   Pulse 83   Temp (!) 97.1 F (36.2 C)   Ht 5' 5.5" (1.664 m)   Wt 158 lb 12.8 oz (72 kg)   LMP 07/12/2016   SpO2 100%   BMI 26.02 kg/m     Physical Examination:   General Appearance: No distress  EYES PERRLA, EOM intact.   NECK Supple, No JVD Pulmonary: normal breath sounds, No wheezing.  CardiovascularNormal S1,S2.  No m/r/g.     ALL OTHER ROS ARE NEGATIVE   Assessment and Plan:  Mild intermittent asthma Well-controlled with avoidance of triggers and allergens Continue albuterol as needed      Patient  satisfied with Plan of action and management. All questions answered  Follow up 1 year   Total Time Spent 15 mins   Damarco Keysor Patricia Pesa, M.D.  [facility]  Medical Director [place] Director [facility]io-[facility] Department

## 2020-12-19 ENCOUNTER — Telehealth: Payer: Self-pay

## 2020-12-19 NOTE — Telephone Encounter (Signed)
Remote ICM transmission received.  Attempted call to patient regarding ICM remote transmission and left detailed message per DPR.  Advised to return call for any fluid symptoms or questions. Next ICM remote transmission scheduled 01/28/2021.

## 2020-12-19 NOTE — Progress Notes (Signed)
EPIC Encounter for ICM Monitoring  Patient Name: Suzanne Spears is a 60 y.o. female Date: 12/19/2020 Primary Care Physican: [name], MD Primary Cardiologist: Rockey Situ Electrophysiologist: Vergie Living Pacing:  98.5%    10/23/2020 Office Weight: 159 lbs    Battery Longevity: 6 months            Attempted call to patient and unable to reach.  Left detailed message per DPR regarding transmission. Transmission reviewed.    OptiVol Thoracic impedance suggesting normal fluid levels.    Prescribed:  Furosemide 20 mg Take1 tablet (20 mg) once daily. May take extra 20 mg as needed for weight gain or swelling. Potassium 10 mEq take 2 tablets daily   Labs:   11/06/2020 Creatinine 1.15, BUN 20, Potassuim 3.8, Sodium 138, GFR 55 07/17/2020 Creatinine 1.62, BUN 21, Potassium 4.3, Sodium 141, GFR 36 05/02/2020 Creatinine 1.04, BUN 22, Potassium 3.7, Sodium 140, GFR >60  03/16/2020 Creatinine 1.26, BUN 32, Potassium 3.9, Sodium 142, GFR 49 03/02/2020 Creatinine 1.55, BUN 28, Potassium 4.7, Sodium 143, GFR 39 A complete set of results can be found in Results Review.   Recommendations: Left voice mail with ICM number and encouraged to call if experiencing any fluid symptoms.   Follow-up plan: ICM clinic phone appointment on 01/28/2021.  91 day device clinic remote transmission due 03/11/2021.     EP/Cardiology Office Visits:  01/14/2021 with Dr. Rockey Situ.    Copy of ICM check sent to Dr. Caryl Comes.    3 month ICM trend: 12/17/2020.    1 Year ICM trend:       [name], RN 12/19/2020 11:33 AM

## 2020-12-25 ENCOUNTER — Telehealth: Payer: Self-pay | Admitting: Cardiovascular Disease

## 2020-12-25 NOTE — Telephone Encounter (Signed)
Patient states she does not feel well today, states she feels like her heart is "doing something funky" Pt c/o swelling: STAT is pt has developed SOB within 24 hours  If swelling, where is the swelling located? Bilateral ankles and legs  How much weight have you gained and in what time span? 6 pounds since last Wednesday  Have you gained 3 pounds in a day or 5 pounds in a week? yes  Do you have a log of your daily weights (if so, list)? Last Wednesday weight 157.6 Today is 163.6  Are you currently taking a fluid pill? Yes, she took 2 this morning  Are you currently SOB? A little  Have you traveled recently? Yes  BP is 126/63 HR 77

## 2020-12-25 NOTE — Telephone Encounter (Signed)
Was able to reach back out to Suzanne Spears regarding her weight gain and swelling to her bilateral legs. Pt reports she was away on vacation for 5 days in [place] and went outside her normal diet or increase salts, sodas and other foods she usually does not consume. Travel home yesterday in car, did not wear compression hose during travel or while in [place].   Pt reports weight gain of at least 6 lbs, 157.6 to today of 163.6. Today took Lasix 20 mg, advised may need to take an extra dose. No SOB then usual per pt, but feels "crumbly". Pt currently at work, advised may need to take rest day off and rest with propping her feet up.   I have advised the patient to take a few days to  - elevate her lower extremities when possible at rest - use some compression socks, especially when up and moving - decrease her salt intake, go back to her normal diet Take Lasix 20 mg daily, with extra tab as needed - continue weight daily in the morning after getting up and voiding and record these weights  If weight and swelling has not improved by at least Friday, then please call back, may need appt to be seen. Hopefully current swelling and weight gain can be contributed to her recent travel and diet change while on vacation. Pt agrees. At this time, nothing further, pt will leave work and start with recommendations as stated above. [name] very thankful for returning her call, will call back if not improvement by end of week.

## 2020-12-27 ENCOUNTER — Emergency Department: Payer: BC Managed Care – PPO

## 2020-12-27 ENCOUNTER — Encounter: Payer: Self-pay | Admitting: Emergency Medicine

## 2020-12-27 ENCOUNTER — Ambulatory Visit
Admission: RE | Admit: 2020-12-27 | Discharge: 2020-12-27 | Disposition: A | Discharge status: Home / Self Care | Payer: BC Managed Care – PPO | Source: Ambulatory Visit | Attending: Internal Medicine | Admitting: Internal Medicine

## 2020-12-27 ENCOUNTER — Ambulatory Visit: Payer: BC Managed Care – PPO | Admitting: Internal Medicine

## 2020-12-27 ENCOUNTER — Other Ambulatory Visit: Payer: Self-pay

## 2020-12-27 ENCOUNTER — Emergency Department
Admission: EM | Admit: 2020-12-27 | Discharge: 2020-12-28 | Disposition: A | Discharge status: Home / Self Care | Payer: BC Managed Care – PPO | Attending: Emergency Medicine | Admitting: Emergency Medicine

## 2020-12-27 VITALS — BP 122/66 | HR 83 | Ht 66.0 in | Wt 160.6 lb

## 2020-12-27 DIAGNOSIS — Z1231 Encounter for screening mammogram for malignant neoplasm of breast: Secondary | ICD-10-CM | POA: Insufficient documentation

## 2020-12-27 DIAGNOSIS — Z950 Presence of cardiac pacemaker: Secondary | ICD-10-CM | POA: Diagnosis not present

## 2020-12-27 DIAGNOSIS — I420 Dilated cardiomyopathy: Secondary | ICD-10-CM

## 2020-12-27 DIAGNOSIS — M7989 Other specified soft tissue disorders: Secondary | ICD-10-CM | POA: Insufficient documentation

## 2020-12-27 DIAGNOSIS — Z20822 Contact with and (suspected) exposure to covid-19: Secondary | ICD-10-CM | POA: Diagnosis not present

## 2020-12-27 DIAGNOSIS — R0781 Pleurodynia: Secondary | ICD-10-CM | POA: Diagnosis present

## 2020-12-27 DIAGNOSIS — Z79899 Other long term (current) drug therapy: Secondary | ICD-10-CM | POA: Insufficient documentation

## 2020-12-27 DIAGNOSIS — I5042 Chronic combined systolic (congestive) and diastolic (congestive) heart failure: Secondary | ICD-10-CM | POA: Insufficient documentation

## 2020-12-27 DIAGNOSIS — N182 Chronic kidney disease, stage 2 (mild): Secondary | ICD-10-CM | POA: Insufficient documentation

## 2020-12-27 DIAGNOSIS — R921 Mammographic calcification found on diagnostic imaging of breast: Secondary | ICD-10-CM

## 2020-12-27 DIAGNOSIS — Z8616 Personal history of COVID-19: Secondary | ICD-10-CM | POA: Diagnosis not present

## 2020-12-27 DIAGNOSIS — I130 Hypertensive heart and chronic kidney disease with heart failure and stage 1 through stage 4 chronic kidney disease, or unspecified chronic kidney disease: Secondary | ICD-10-CM | POA: Diagnosis not present

## 2020-12-27 DIAGNOSIS — M79606 Pain in leg, unspecified: Secondary | ICD-10-CM

## 2020-12-27 DIAGNOSIS — R928 Other abnormal and inconclusive findings on diagnostic imaging of breast: Secondary | ICD-10-CM | POA: Insufficient documentation

## 2020-12-27 DIAGNOSIS — R0602 Shortness of breath: Secondary | ICD-10-CM | POA: Diagnosis not present

## 2020-12-27 DIAGNOSIS — J45909 Unspecified asthma, uncomplicated: Secondary | ICD-10-CM | POA: Diagnosis not present

## 2020-12-27 DIAGNOSIS — Z91040 Latex allergy status: Secondary | ICD-10-CM | POA: Insufficient documentation

## 2020-12-27 DIAGNOSIS — I5032 Chronic diastolic (congestive) heart failure: Secondary | ICD-10-CM

## 2020-12-27 LAB — CBC
HCT: 41.4 % (ref 36.0–46.0)
Hemoglobin: 13.5 g/dL (ref 12.0–15.0)
MCH: 30.9 pg (ref 26.0–34.0)
MCHC: 32.6 g/dL (ref 30.0–36.0)
MCV: 94.7 fL (ref 80.0–100.0)
Platelets: 239 10*3/uL (ref 150–400)
RBC: 4.37 MIL/uL (ref 3.87–5.11)
RDW: 13.6 % (ref 11.5–15.5)
WBC: 6.7 10*3/uL (ref 4.0–10.5)
nRBC: 0 % (ref 0.0–0.2)

## 2020-12-27 LAB — BASIC METABOLIC PANEL
Anion gap: 9 (ref 5–15)
BUN: 23 mg/dL — ABNORMAL HIGH (ref 6–20)
CO2: 27 mmol/L (ref 22–32)
Calcium: 9.4 mg/dL (ref 8.9–10.3)
Chloride: 102 mmol/L (ref 98–111)
Creatinine, Ser: 1.1 mg/dL — ABNORMAL HIGH (ref 0.44–1.00)
GFR, Estimated: 58 mL/min — ABNORMAL LOW (ref >60)
Glucose, Bld: 131 mg/dL — ABNORMAL HIGH (ref 70–99)
Potassium: 4.1 mmol/L (ref 3.5–5.1)
Sodium: 138 mmol/L (ref 135–145)

## 2020-12-27 LAB — DIFFERENTIAL
Abs Immature Granulocytes: 0.01 10*3/uL (ref 0.00–0.07)
Basophils Absolute: 0 10*3/uL (ref 0.0–0.1)
Basophils Relative: 1 %
Eosinophils Absolute: 0.1 10*3/uL (ref 0.0–0.5)
Eosinophils Relative: 2 %
Immature Granulocytes: 0 %
Lymphocytes Relative: 35 %
Lymphs Abs: 2.3 10*3/uL (ref 0.7–4.0)
Monocytes Absolute: 0.7 10*3/uL (ref 0.1–1.0)
Monocytes Relative: 11 %
Neutro Abs: 3.3 10*3/uL (ref 1.7–7.7)
Neutrophils Relative %: 51 %

## 2020-12-27 LAB — D-DIMER, QUANTITATIVE: D-Dimer, Quant: 0.54 ug/mL-FEU — ABNORMAL HIGH (ref 0.00–0.50)

## 2020-12-27 LAB — RESP PANEL BY RT-PCR (FLU A&B, COVID) ARPGX2
Influenza A by PCR: NEGATIVE
Influenza B by PCR: NEGATIVE
SARS Coronavirus 2 by RT PCR: NEGATIVE

## 2020-12-27 LAB — TROPONIN I (HIGH SENSITIVITY): Troponin I (High Sensitivity): 21 ng/L — ABNORMAL HIGH (ref <18)

## 2020-12-27 LAB — BRAIN NATRIURETIC PEPTIDE: B Natriuretic Peptide: 54.1 pg/mL (ref 0.0–100.0)

## 2020-12-27 MED ORDER — IOHEXOL 350 MG/ML SOLN
75.0000 mL | Freq: Once | INTRAVENOUS | Status: AC | PRN
  Administered 2020-12-27: 75 mL via INTRAVENOUS

## 2020-12-27 NOTE — Telephone Encounter (Signed)
I spoke with the patient and confirmed with her that I have placed her on the schedule for today at 3:15 pm with Dr. Caryl Comes. I have confirmed the location of the office with her. She voices understanding and is agreeable.  She advised her sister will be taking her up to the visit.

## 2020-12-27 NOTE — ED Triage Notes (Signed)
Pt comes into the ED via POV c/o left leg pain and increased SHOB.  Pt states that she was on a 7 hour car ride recently and then she noticed her left leg swelling with pain.  Pt now is starting to have increased [facility] so her MD sent her over to r/o DVT and PE.  Pt does admit she has CHF in addition as well, but that the symptoms are worse than her baseline CHF.
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
## 2020-12-27 NOTE — Telephone Encounter (Signed)
Reviewed the patient's chart with Dr. Caryl Comes. Per Dr. Caryl Comes, the patient should be seen and evaluated in the office for reported symptoms. He has offered to add her on at 3:15 pm today on his [place] schedule.  I have called and notified the patient. She does not feel like she can drive to [facility] as she could barely drive to the [facility] today. I have inquired if there is anyone else who could take her to [place] today. She will call  her husband and see if he can drive her.   She will call me back to confirm.

## 2020-12-27 NOTE — Telephone Encounter (Signed)
I called and spoke with the patient. She reports that he weight is down 5 lbs from Tuesday when she called the office.  She did take lasix 40 mg on Tuesday and Wednesday. She has not taken this today due to mammogram being done.  She took extra potassium both of these days.  She advised that "something just doesn't feel right." Her legs feel heavy. She was not able to sleep well last night due to a tingling/ hot/ burning sensation that made it hard to get out of bed. She advised when she was able to get up, she put on her compression socks and she was able to move her feet.  She also reports a vein that is "tender" to one side of her leg.   She states that right now she just feels like she wants to go home and sleep.   I do not currently have any in office appointments. She is aware I will review with Dr. Caryl Comes for further recommendations and call her back.  The patient voices understanding and is agreeable.
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
## 2020-12-27 NOTE — Discharge Instructions (Addendum)
Please return for increasing pain, fever or shortness of breath.  The ultrasound did not show any blood clots in the legs.  The chest CT did not show any blood clots in the lungs or pneumonia or anything else either.  The coronavirus test was negative.  Rest of blood work also looked good.  I am not sure what was causing the pain but it does not look life-threatening at this point.  Please use Tylenol as needed for the pain and follow-up with your regular doctor or cardiologist.  If any of the symptoms including the pain or shortness of breath or anything else get worse please do not hesitate to return.

## 2020-12-27 NOTE — Telephone Encounter (Signed)
Patient calling States that she will be able to drive to the [place] office this afternoon

## 2020-12-27 NOTE — Patient Instructions (Signed)
Medication Instructions:  Your physician recommends that you continue on your current medications as directed. Please refer to the Current Medication list given to you today.  *If you need a refill on your cardiac medications before your next appointment, please call your pharmacy*   Lab Work: None ordered.   If you have labs (blood work) drawn today and your tests are completely normal, you will receive your results only by: [place] (if you have MyChart) OR A paper copy in the mail If you have any lab test that is abnormal or we need to change your treatment, we will call you to review the results.   Testing/Procedures: None ordered.    Follow-Up: At [facility], you and your health needs are our priority.  As part of our continuing mission to provide you with exceptional heart care, we have created designated Provider Care Teams.  These Care Teams include your primary Cardiologist (physician) and Advanced Practice Providers (APPs -  Physician Assistants and Nurse Practitioners) who all work together to provide you with the care you need, when you need it.  We recommend signing up for the patient portal called "MyChart".  Sign up information is provided on this After Visit Summary.  MyChart is used to connect with patients for Virtual Visits (Telemedicine).  Patients are able to view lab/test results, encounter notes, upcoming appointments, etc.  Non-urgent messages can be sent to your provider as well.   To learn more about what you can do with MyChart, go to [email or web address].    Your next appointment:    [facility] ED for CTA for PE

## 2020-12-27 NOTE — Telephone Encounter (Signed)
Patient calling to be worked in or seen asap to eval below   No open slots .  Added to waitlist   Please call patient per request.

## 2020-12-27 NOTE — ED Notes (Signed)
Blue top sent to lab with save label.

## 2020-12-28 LAB — TROPONIN I (HIGH SENSITIVITY): Troponin I (High Sensitivity): 25 ng/L — ABNORMAL HIGH (ref <18)

## 2020-12-28 NOTE — ED Provider Notes (Signed)
  Physical Exam  BP (!) 131/59   Pulse 80   Temp 98.2 F (36.8 C) (Oral)   Resp 13   Ht 5\' 6"  (1.676 m)   Wt 72.8 kg   LMP 07/12/2016   SpO2 100%   BMI 25.90 kg/m   Physical Exam  ED Course/Procedures     Procedures  MDM  1:24 AM  Assumed care of patient at shift change.  Patient here with pleuritic chest pain.  CTA of the chest showed no acute abnormality.  First troponin 21.  Second pending at time of signout.  Repeat troponin 25.  Plan was to discharge if this was negative/flat.  Will discharge home with close outpatient follow-up.  Patient and husband at bedside comfortable with this plan.  At this time, I do not feel there is any life-threatening condition present. I have reviewed, interpreted and discussed all results (EKG, imaging, lab, urine as appropriate) and exam findings with patient/family. I have reviewed nursing notes and appropriate previous records.  I feel the patient is safe to be discharged home without further emergent workup and can continue workup as an outpatient as needed. Discussed usual and customary return precautions. Patient/family verbalize understanding and are comfortable with this plan.  Outpatient follow-up has been provided as needed. All questions have been answered.        Sebastain Fishbaugh, Delice Bison, DO 12/28/20 0126

## 2020-12-28 NOTE — ED Notes (Signed)
Patient stable and discharged with all personal belongings and AVS. AVS and discharge instructions reviewed with patient and opportunity for questions provided.   

## 2021-01-07 ENCOUNTER — Ambulatory Visit (INDEPENDENT_AMBULATORY_CARE_PROVIDER_SITE_OTHER): Payer: BC Managed Care – PPO

## 2021-01-07 ENCOUNTER — Other Ambulatory Visit: Payer: Self-pay | Admitting: Internal Medicine

## 2021-01-07 DIAGNOSIS — I420 Dilated cardiomyopathy: Secondary | ICD-10-CM

## 2021-01-07 DIAGNOSIS — M719 Bursopathy, unspecified: Secondary | ICD-10-CM

## 2021-01-07 LAB — CUP PACEART REMOTE DEVICE CHECK
Battery Remaining Longevity: 6 mo
Battery Voltage: 2.82 V
Brady Statistic AP VP Percent: 4 %
Brady Statistic AP VS Percent: 0.16 %
Brady Statistic AS VP Percent: 94.58 %
Brady Statistic AS VS Percent: 1.25 %
Brady Statistic RA Percent Paced: 4.17 %
Brady Statistic RV Percent Paced: 3.23 %
Date Time Interrogation Session: 20221114033424
HighPow Impedance: 73 Ohm
Implantable Lead Implant Date: 20160421
Implantable Lead Implant Date: 20160421
Implantable Lead Implant Date: 20161006
Implantable Lead Implant Date: 20161006
Implantable Lead Location: 753858
Implantable Lead Location: 753858
Implantable Lead Location: 753859
Implantable Lead Location: 753860
Implantable Lead Model: 5071
Implantable Lead Model: 5071
Implantable Lead Model: 5076
Implantable Pulse Generator Implant Date: 20160421
Lead Channel Impedance Value: 285 Ohm
Lead Channel Impedance Value: 285 Ohm
Lead Channel Impedance Value: 323 Ohm
Lead Channel Impedance Value: 342 Ohm
Lead Channel Impedance Value: 4047 Ohm
Lead Channel Impedance Value: 4047 Ohm
Lead Channel Pacing Threshold Amplitude: 0.5 V
Lead Channel Pacing Threshold Amplitude: 1.375 V
Lead Channel Pacing Threshold Pulse Width: 0.4 ms
Lead Channel Pacing Threshold Pulse Width: 0.4 ms
Lead Channel Sensing Intrinsic Amplitude: 2.25 mV
Lead Channel Sensing Intrinsic Amplitude: 2.25 mV
Lead Channel Sensing Intrinsic Amplitude: 26.75 mV
Lead Channel Sensing Intrinsic Amplitude: 26.75 mV
Lead Channel Setting Pacing Amplitude: 1.5 V
Lead Channel Setting Pacing Amplitude: 2 V
Lead Channel Setting Pacing Amplitude: 2.5 V
Lead Channel Setting Pacing Pulse Width: 0.4 ms
Lead Channel Setting Pacing Pulse Width: 0.4 ms
Lead Channel Setting Sensing Sensitivity: 0.3 mV

## 2021-01-07 NOTE — Telephone Encounter (Signed)
RX Refill: tramadol Last Seen: 05-01-20 Last Ordered: 09-29-2018 Next Appt: 03-08-21

## 2021-01-08 ENCOUNTER — Other Ambulatory Visit: Payer: Self-pay

## 2021-01-08 DIAGNOSIS — I5032 Chronic diastolic (congestive) heart failure: Secondary | ICD-10-CM

## 2021-01-08 MED ORDER — TRAMADOL HCL 50 MG PO TABS: 50.0000 mg | ORAL_TABLET | Freq: Four times a day (QID) | ORAL | 1 refills | Status: DC | PRN

## 2021-01-08 MED ORDER — FUROSEMIDE 20 MG PO TABS: 20.0000 mg | ORAL_TABLET | Freq: Every day | ORAL | 0 refills | Status: DC

## 2021-01-08 MED ORDER — ENTRESTO 24-26 MG PO TABS: 0.5000 | ORAL_TABLET | Freq: Two times a day (BID) | ORAL | 1 refills | Status: AC

## 2021-01-08 MED ORDER — TRAMADOL HCL 50 MG PO TABS: 50.0000 mg | ORAL_TABLET | Freq: Four times a day (QID) | ORAL | 1 refills | Status: AC | PRN

## 2021-01-10 ENCOUNTER — Other Ambulatory Visit: Payer: Self-pay | Admitting: Internal Medicine
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
## 2021-01-14 ENCOUNTER — Ambulatory Visit (INDEPENDENT_AMBULATORY_CARE_PROVIDER_SITE_OTHER): Payer: BC Managed Care – PPO | Admitting: Cardiovascular Disease

## 2021-01-14 ENCOUNTER — Other Ambulatory Visit: Payer: Self-pay

## 2021-01-14 ENCOUNTER — Encounter: Payer: Self-pay | Admitting: Cardiovascular Disease

## 2021-01-14 ENCOUNTER — Telehealth: Payer: Self-pay | Admitting: Cardiovascular Disease

## 2021-01-14 VITALS — BP 100/60 | HR 80 | Ht 65.5 in | Wt 159.0 lb

## 2021-01-14 DIAGNOSIS — I255 Ischemic cardiomyopathy: Secondary | ICD-10-CM | POA: Diagnosis not present

## 2021-01-14 DIAGNOSIS — Z95810 Presence of automatic (implantable) cardiac defibrillator: Secondary | ICD-10-CM | POA: Diagnosis not present

## 2021-01-14 DIAGNOSIS — Z23 Encounter for immunization: Secondary | ICD-10-CM

## 2021-01-14 DIAGNOSIS — N289 Disorder of kidney and ureter, unspecified: Secondary | ICD-10-CM

## 2021-01-14 DIAGNOSIS — I5032 Chronic diastolic (congestive) heart failure: Secondary | ICD-10-CM

## 2021-01-14 DIAGNOSIS — Z13220 Encounter for screening for lipoid disorders: Secondary | ICD-10-CM

## 2021-01-14 DIAGNOSIS — I420 Dilated cardiomyopathy: Secondary | ICD-10-CM | POA: Diagnosis not present

## 2021-01-14 DIAGNOSIS — Z0279 Encounter for issue of other medical certificate: Secondary | ICD-10-CM

## 2021-01-14 NOTE — Patient Instructions (Addendum)
Medication Instructions:  Please START Farxiga 10 mg daily  Please try and register coupon for reduce Co-pay  If you need a refill on your cardiac medications before your next appointment, please call your pharmacy.   Lab work: Lipids today  Testing/Procedures: No new testing needed  Follow-Up: At [facility], you and your health needs are our priority.  As part of our continuing mission to provide you with exceptional heart care, we have created designated Provider Care Teams.  These Care Teams include your primary Cardiologist (physician) and Advanced Practice Providers (APPs -  Physician Assistants and Nurse Practitioners) who all work together to provide you with the care you need, when you need it.  You will need a follow up appointment in 3 months  Providers on your designated Care Team:   Murray Hodgkins, NP Christell Faith, PA-C Cadence Kathlen Mody, [place]  COVID-19 Vaccine Information can be found at: ShippingScam.co.uk For questions related to vaccine distribution or appointments, please email vaccine@[email or web address] .[email or web address] or call [phone].

## 2021-01-14 NOTE — Progress Notes (Signed)
Remote ICD transmission.   

## 2021-01-14 NOTE — Telephone Encounter (Signed)
Recieved request from : [employer]ist  Patient paid $29 fee and completed release form  Forwarded to nurse for completion

## 2021-01-15 ENCOUNTER — Encounter: Payer: Self-pay | Admitting: Cardiovascular Disease

## 2021-01-15 LAB — LIPID PANEL
Chol/HDL Ratio: 4.1 ratio (ref 0.0–4.4)
Cholesterol, Total: 231 mg/dL — ABNORMAL HIGH (ref 100–199)
HDL: 56 mg/dL (ref >39)
LDL Chol Calc (NIH): 156 mg/dL — ABNORMAL HIGH (ref 0–99)
Triglycerides: 107 mg/dL (ref 0–149)
VLDL Cholesterol Cal: 19 mg/dL (ref 5–40)

## 2021-01-16 MED ORDER — DAPAGLIFLOZIN PROPANEDIOL 10 MG PO TABS: 10.0000 mg | ORAL_TABLET | Freq: Every day | ORAL | 3 refills | Status: DC

## 2021-01-16 NOTE — Telephone Encounter (Signed)
After reviewing FMLA paperwork, noticed no part of forms had area where provider fills out pt's medical history or for provider's signature.  Reach out to Mrs. Suzanne Spears, asked if there were any more forms that came with her FMLA paperwork, she advised her son printed from her email, she did not "have a chance to really look at it". Explained what I have on hand is her basic info and reason for her filing FMLA, did not see anything related to provider's portion.   Have a copy from last year FMLA, advised not similar to previous forms, last year forms called Certificate of [place] Provider for Teammate's Serious Health Condition, those pages 5-8 not seen with paperwork pt dropped off.  Mrs. Suzanne Spears asked for [name] to hold on to her FMLA papers for now, hold the deposit she paid, and will scan her email for the rest of the application or will call the company to get the provider's portion of the [facility] paperwork. Once found, she will have them sent to our office for completion.  Mrs. Suzanne Spears is very thankful for reaching out to her regarding this matter, this RN will keep FMLA folder in her file box for completion.

## 2021-01-16 NOTE — Addendum Note (Signed)
Addended by: Wynema Birch on: 01/16/2021 07:30 AM   Modules accepted: Orders

## 2021-01-21 ENCOUNTER — Other Ambulatory Visit: Payer: Self-pay | Admitting: Internal Medicine

## 2021-01-21 ENCOUNTER — Telehealth: Payer: Self-pay

## 2021-01-21 NOTE — Telephone Encounter (Signed)
Was able to return call to Mrs. Suzanne Spears, she is not wanting to try Crestor as she has not had good experience with statins medications. She would like more information on Repatha or Praluent regarding benefits, side affects, and cost.   Advised she can reach out to our PharmD team that can help answer these questions to help determine what medication will be most suitable for her. If insurance is unable to provide lsot cost for co-pay, advised other options like PA and coupon cards. [name] very thankful for the return call and information, will call PharmD to see what insight they may have.

## 2021-01-21 NOTE — Telephone Encounter (Signed)
Left detail message on VM of pt's recent results okay by DPR, Dr. Rockey Situ advised   "Total cholesterol high 231  LDL high 156  Already on Zetia per the patient  With that ,still high  Consider additional agents such as a statin/Crestor 5 to 10 mg daily  If previous intolerance to statins may need Repatha or Praluent "  Advised Mrs. Zenia Resides since she has seen Dr. Donivan Scull comment on her MyChart, she may respond through her MyChart account on medication changes, or she may call the office, otherwise will see at next visit.   Results released to MyChart by Dr. Rockey Situ Written by Minna Merritts, MD on 01/20/2021  2:50 PM  Seen by patient Jamey Reas on 01/20/2021  2:56 PM

## 2021-01-21 NOTE — Telephone Encounter (Signed)
Patient calling to discuss repatha and praluent .  She doesn't know anything about either and wants to know before making a decision.   Please call.

## 2021-01-22 MED ORDER — ROSUVASTATIN CALCIUM 5 MG PO TABS: 5.0000 mg | ORAL_TABLET | ORAL | 3 refills | Status: DC

## 2021-01-22 NOTE — Telephone Encounter (Signed)
I spoke to patient. She is unable to come to [place] for an inperson visit to discuss PCSK9i. She is willing to do a telephone visit. We did briefly discuss her intolerances to statins. She states she is willing to try rosuvastatin. Has been on atorvastatin, simvastatin she thinks in the past with muscle pains. Will try rosuvastatin 5mg  every other day. Follow up on 12/20 via telephone call to review how she is doing and discuss PCSK9i if needed after titration of statin to max dose. She is primary prevention LDL goal <100

## 2021-01-22 NOTE — Addendum Note (Signed)
Addended by: Marcelle Overlie D on: 01/22/2021 01:03 PM   Modules accepted: Orders

## 2021-01-23 NOTE — Telephone Encounter (Signed)
Reviewed FMLA forms Completed provider's portion on a cardiac standpoint Copy made for future reference Dr. Rockey Situ sign with today's date. FMLA paperwork handed back to [employer].

## 2021-01-23 NOTE — Telephone Encounter (Signed)
Patient dropped off the rest of her FMLA forms  Placed in nurse box

## 2021-01-24 NOTE — Telephone Encounter (Signed)
Form Completed and provided to [employer] via fax and patient via mail 01-24-21 . JH   Scanned to chart

## 2021-01-28 ENCOUNTER — Ambulatory Visit (INDEPENDENT_AMBULATORY_CARE_PROVIDER_SITE_OTHER): Payer: BC Managed Care – PPO

## 2021-01-28 DIAGNOSIS — Z95810 Presence of automatic (implantable) cardiac defibrillator: Secondary | ICD-10-CM

## 2021-01-28 DIAGNOSIS — I5032 Chronic diastolic (congestive) heart failure: Secondary | ICD-10-CM | POA: Diagnosis not present

## 2021-01-30 ENCOUNTER — Telehealth: Payer: Self-pay

## 2021-01-30 NOTE — Progress Notes (Signed)
EPIC Encounter for ICM Monitoring  Patient Name: Suzanne Spears is a 60 y.o. female Date: 01/30/2021 Primary Care Physican: [name], MD Primary Cardiologist: Rockey Situ Electrophysiologist: Vergie Living Pacing:  98.4%    01/14/2021 Office Weight: 159 lbs    Battery Longevity: 6 months            Attempted call to patient and unable to reach.  Left detailed message per DPR regarding transmission. Transmission reviewed.    OptiVol Thoracic impedance suggesting normal fluid levels.    Prescribed:  Furosemide 20 mg Take1 tablet (20 mg) once daily. May take extra 20 mg as needed for weight gain or swelling. Potassium 10 mEq take 2 tablets daily   Labs:   12/27/2020 Creatinine 1.10, BUN 23, Potassium 4.1, Sodium 138, GFR 58 11/06/2020 Creatinine 1.15, BUN 20, Potassuim 3.8, Sodium 138, GFR 55 07/17/2020 Creatinine 1.62, BUN 21, Potassium 4.3, Sodium 141, GFR 36 05/02/2020 Creatinine 1.04, BUN 22, Potassium 3.7, Sodium 140, GFR >60  03/16/2020 Creatinine 1.26, BUN 32, Potassium 3.9, Sodium 142, GFR 49 03/02/2020 Creatinine 1.55, BUN 28, Potassium 4.7, Sodium 143, GFR 39 A complete set of results can be found in Results Review.   Recommendations: Left voice mail with ICM number and encouraged to call if experiencing any fluid symptoms.   Follow-up plan: ICM clinic phone appointment on 03/04/2021.  91 day device clinic remote transmission due 03/11/2021.     EP/Cardiology Office Visits:  04/16/2021 with Dr. Rockey Situ.    Copy of ICM check sent to Dr. Caryl Comes.    3 month ICM trend: 01/28/2021.    12-14 Month ICM trend:       [name], RN 01/30/2021 8:45 AM

## 2021-01-30 NOTE — Telephone Encounter (Signed)
Remote ICM transmission received.  Attempted call to patient regarding ICM remote transmission and left detailed message per DPR.  Advised to return call for any fluid symptoms or questions. Next ICM remote transmission scheduled 03/04/2021.    

## 2021-02-07 ENCOUNTER — Ambulatory Visit (INDEPENDENT_AMBULATORY_CARE_PROVIDER_SITE_OTHER): Payer: BC Managed Care – PPO

## 2021-02-07 DIAGNOSIS — I420 Dilated cardiomyopathy: Secondary | ICD-10-CM

## 2021-02-07 LAB — CUP PACEART REMOTE DEVICE CHECK
Battery Remaining Longevity: 5 mo
Battery Voltage: 2.79 V
Brady Statistic AP VP Percent: 4.34 %
Brady Statistic AP VS Percent: 0.19 %
Brady Statistic AS VP Percent: 94.21 %
Brady Statistic AS VS Percent: 1.26 %
Brady Statistic RA Percent Paced: 4.54 %
Brady Statistic RV Percent Paced: 2.09 %
Date Time Interrogation Session: 20221215001602
HighPow Impedance: 87 Ohm
Implantable Lead Implant Date: 20160421
Implantable Lead Implant Date: 20160421
Implantable Lead Implant Date: 20161006
Implantable Lead Implant Date: 20161006
Implantable Lead Location: 753858
Implantable Lead Location: 753858
Implantable Lead Location: 753859
Implantable Lead Location: 753860
Implantable Lead Model: 5071
Implantable Lead Model: 5071
Implantable Lead Model: 5076
Implantable Pulse Generator Implant Date: 20160421
Lead Channel Impedance Value: 323 Ohm
Lead Channel Impedance Value: 342 Ohm
Lead Channel Impedance Value: 380 Ohm
Lead Channel Impedance Value: 399 Ohm
Lead Channel Impedance Value: 4047 Ohm
Lead Channel Impedance Value: 4047 Ohm
Lead Channel Pacing Threshold Amplitude: 0.5 V
Lead Channel Pacing Threshold Amplitude: 1.375 V
Lead Channel Pacing Threshold Pulse Width: 0.4 ms
Lead Channel Pacing Threshold Pulse Width: 0.4 ms
Lead Channel Sensing Intrinsic Amplitude: 2.625 mV
Lead Channel Sensing Intrinsic Amplitude: 2.625 mV
Lead Channel Sensing Intrinsic Amplitude: 24.125 mV
Lead Channel Sensing Intrinsic Amplitude: 24.125 mV
Lead Channel Setting Pacing Amplitude: 1.5 V
Lead Channel Setting Pacing Amplitude: 2 V
Lead Channel Setting Pacing Amplitude: 2.5 V
Lead Channel Setting Pacing Pulse Width: 0.4 ms
Lead Channel Setting Pacing Pulse Width: 0.4 ms
Lead Channel Setting Sensing Sensitivity: 0.3 mV

## 2021-02-08 ENCOUNTER — Other Ambulatory Visit: Payer: Self-pay | Admitting: Internal Medicine

## 2021-02-12 ENCOUNTER — Ambulatory Visit (INDEPENDENT_AMBULATORY_CARE_PROVIDER_SITE_OTHER): Payer: BC Managed Care – PPO | Admitting: Pharmacist

## 2021-02-12 ENCOUNTER — Other Ambulatory Visit: Payer: Self-pay

## 2021-02-12 DIAGNOSIS — Z13220 Encounter for screening for lipoid disorders: Secondary | ICD-10-CM

## 2021-02-12 MED ORDER — ROSUVASTATIN CALCIUM 5 MG PO TABS: 5.0000 mg | ORAL_TABLET | Freq: Every day | ORAL | 3 refills | Status: DC
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
## 2021-02-19 NOTE — Progress Notes (Signed)
Remote ICD transmission.   

## 2021-03-04 ENCOUNTER — Ambulatory Visit (INDEPENDENT_AMBULATORY_CARE_PROVIDER_SITE_OTHER): Payer: BC Managed Care – PPO

## 2021-03-04 DIAGNOSIS — I5032 Chronic diastolic (congestive) heart failure: Secondary | ICD-10-CM

## 2021-03-04 DIAGNOSIS — Z95810 Presence of automatic (implantable) cardiac defibrillator: Secondary | ICD-10-CM | POA: Diagnosis not present

## 2021-03-06 NOTE — Progress Notes (Signed)
EPIC Encounter for ICM Monitoring  Patient Name: Suzanne Spears is a 61 y.o. female Date: 03/06/2021 Primary Care Physican: [name], MD Primary Cardiologist: Rockey Situ Electrophysiologist: Vergie Living Pacing:  98.5%    03/06/2021 Weight: 160 lbs    Battery Longevity: 3 months            Spoke with patient and heart failure questions reviewed.  Pt asymptomatic for fluid accumulation but is having migraines.    OptiVol Thoracic impedance normal but was suggesting intermittent days with possible fluid accumulation for past month.    Prescribed:  Furosemide 20 mg Take1 tablet (20 mg) once daily. May take extra 20 mg as needed for weight gain or swelling. Potassium 10 mEq take 2 tablets daily   Labs:   12/27/2020 Creatinine 1.10, BUN 23, Potassium 4.1, Sodium 138, GFR 58 11/06/2020 Creatinine 1.15, BUN 20, Potassuim 3.8, Sodium 138, GFR 55 07/17/2020 Creatinine 1.62, BUN 21, Potassium 4.3, Sodium 141, GFR 36 05/02/2020 Creatinine 1.04, BUN 22, Potassium 3.7, Sodium 140, GFR >60  03/16/2020 Creatinine 1.26, BUN 32, Potassium 3.9, Sodium 142, GFR 49 03/02/2020 Creatinine 1.55, BUN 28, Potassium 4.7, Sodium 143, GFR 39 A complete set of results can be found in Results Review.   Recommendations:  No changes and encouraged to call if experiencing any fluid symptoms.   Follow-up plan: ICM clinic phone appointment on 04/08/2021.  91 day device clinic remote transmission due 03/11/2021.     EP/Cardiology Office Visits:  04/16/2021 with Dr. Rockey Situ.    Copy of ICM check sent to Dr. Caryl Comes.    3 month ICM trend: 03/04/2021.    12-14 Month ICM trend:     [name], RN 03/06/2021 12:30 PM

## 2021-03-07 ENCOUNTER — Other Ambulatory Visit: Payer: Self-pay | Admitting: Student

## 2021-03-07 DIAGNOSIS — M542 Cervicalgia: Secondary | ICD-10-CM

## 2021-03-08 ENCOUNTER — Encounter: Payer: Self-pay | Admitting: Internal Medicine

## 2021-03-08 ENCOUNTER — Ambulatory Visit (INDEPENDENT_AMBULATORY_CARE_PROVIDER_SITE_OTHER): Payer: BC Managed Care – PPO | Admitting: Internal Medicine

## 2021-03-08 ENCOUNTER — Other Ambulatory Visit
Admission: RE | Admit: 2021-03-08 | Discharge: 2021-03-08 | Disposition: A | Discharge status: Home / Self Care | Payer: BC Managed Care – PPO | Attending: Cardiovascular Disease | Admitting: Cardiovascular Disease

## 2021-03-08 ENCOUNTER — Other Ambulatory Visit
Admission: RE | Admit: 2021-03-08 | Discharge: 2021-03-08 | Disposition: A | Discharge status: Home / Self Care | Payer: BC Managed Care – PPO | Attending: Internal Medicine | Admitting: Internal Medicine

## 2021-03-08 ENCOUNTER — Other Ambulatory Visit: Payer: Self-pay

## 2021-03-08 VITALS — BP 104/66 | HR 79 | Temp 98.3°F | Ht 65.5 in | Wt 165.6 lb

## 2021-03-08 DIAGNOSIS — Z13220 Encounter for screening for lipoid disorders: Secondary | ICD-10-CM | POA: Insufficient documentation

## 2021-03-08 DIAGNOSIS — R5383 Other fatigue: Secondary | ICD-10-CM | POA: Diagnosis present

## 2021-03-08 DIAGNOSIS — R7301 Impaired fasting glucose: Secondary | ICD-10-CM | POA: Diagnosis present

## 2021-03-08 DIAGNOSIS — Z0000 Encounter for general adult medical examination without abnormal findings: Secondary | ICD-10-CM

## 2021-03-08 DIAGNOSIS — E785 Hyperlipidemia, unspecified: Secondary | ICD-10-CM

## 2021-03-08 DIAGNOSIS — I420 Dilated cardiomyopathy: Secondary | ICD-10-CM

## 2021-03-08 LAB — COMPREHENSIVE METABOLIC PANEL
ALT: 24 U/L (ref 0–44)
AST: 32 U/L (ref 15–41)
Albumin: 4.3 g/dL (ref 3.5–5.0)
Alkaline Phosphatase: 110 U/L (ref 38–126)
Anion gap: 9 (ref 5–15)
BUN: 27 mg/dL — ABNORMAL HIGH (ref 6–20)
CO2: 29 mmol/L (ref 22–32)
Calcium: 9.5 mg/dL (ref 8.9–10.3)
Chloride: 102 mmol/L (ref 98–111)
Creatinine, Ser: 1.14 mg/dL — ABNORMAL HIGH (ref 0.44–1.00)
GFR, Estimated: 55 mL/min — ABNORMAL LOW (ref >60)
Glucose, Bld: 97 mg/dL (ref 70–99)
Potassium: 4.1 mmol/L (ref 3.5–5.1)
Sodium: 140 mmol/L (ref 135–145)
Total Bilirubin: 0.5 mg/dL (ref 0.3–1.2)
Total Protein: 7.5 g/dL (ref 6.5–8.1)

## 2021-03-08 LAB — HEMOGLOBIN A1C
Hgb A1c MFr Bld: 5.6 % (ref 4.8–5.6)
Mean Plasma Glucose: 114.02 mg/dL

## 2021-03-08 LAB — LIPID PANEL
Cholesterol: 183 mg/dL (ref 0–200)
HDL: 76 mg/dL (ref >40)
LDL Cholesterol: 92 mg/dL (ref 0–99)
Total CHOL/HDL Ratio: 2.4 RATIO
Triglycerides: 75 mg/dL (ref <150)
VLDL: 15 mg/dL (ref 0–40)

## 2021-03-08 NOTE — Progress Notes (Signed)
Patient ID: KARL KNARR, female    DOB: 1960/08/17  Age: 61 y.o. MRN: [id number]  The patient is here for annual preventive  examination and management of other chronic and acute problems  This visit occurred during the SARS-CoV-2 public health emergency.  Safety protocols were in place, including screening questions prior to the visit, additional usage of staff PPE, and extensive cleaning of exam room while observing appropriate contact time as indicated for disinfecting solutions.     The risk factors are reflected in the social history.  The roster of all physicians providing medical care to patient - is listed in the Snapshot section of the chart.  Activities of daily living:  The patient is 100% independent in all ADLs: dressing, toileting, feeding as well as independent mobility  Home safety : The patient has smoke detectors in the home. They wear seatbelts.  There are no firearms at home. There is no violence in the home.   There is no risks for hepatitis, STDs or HIV. There is no   history of blood transfusion. They have no travel history to infectious disease endemic areas of the world.  The patient has seen their dentist in the last six month. They have seen their eye doctor in the last year. They admit to slight hearing difficulty with regard to whispered voices and some television programs.  They have deferred audiologic testing in the last year.  They do not  have excessive sun exposure. Discussed the need for sun protection: hats, long sleeves and use of sunscreen if there is significant sun exposure.   Diet: the importance of a healthy diet is discussed. They do have a healthy diet.  The benefits of regular aerobic exercise were discussed. She walks 4 times per week ,  20 minutes.   Depression screen: there are no signs or vegative symptoms of depression- irritability, change in appetite, anhedonia, sadness/tearfullness.  The following portions of the patient's history were  reviewed and updated as appropriate: allergies, current medications, past family history, past medical history,  past surgical history, past social history  and problem list.  Visual acuity was not assessed per patient preference since she has regular follow up with her ophthalmologist. Hearing and body mass index were assessed and reviewed.   During the course of the visit the patient was educated and counseled about appropriate screening and preventive services including : fall prevention , diabetes screening, nutrition counseling, colorectal cancer screening, and recommended immunizations.    CC: The primary encounter diagnosis was Hyperlipidemia, unspecified hyperlipidemia type. Diagnoses of Impaired fasting glucose, Other fatigue, Cardiomyopathy, dilated, nonischemic ([place]), Visit for preventive health examination, and Encounter for preventive health examination were also pertinent to this visit.  She feels generally well but is still reacting to unexpected news that her daughter Jori Moll is moving to [place] this weekend with husband and patient's only grandson .  Upset that the news was given at Christmas with little notice.   2) gaining weight .  Not exercising but watching diet.  Exercise and diet discussed in detail.   History Gazella has a past medical history of AICD (automatic cardioverter/defibrillator) present, Allergy, Anemia, Asthma, Cardiomyopathy, dilated, nonischemic ([place]), Chronic systolic (congestive) heart failure ([place]), Cough, Depression, Dizziness, GERD (gastroesophageal reflux disease), HFrEF (heart failure with reduced ejection fraction) ([place]), Hip pain, right (200), History of 2019 novel coronavirus disease (COVID-19) (03/11/2019), History of bronchitis, History of shingles, Hyperlipidemia, Hypertension, Left bundle branch block (2008), Migraine, Mitral valve prolapse syndrome, Pericarditis (2008), Peripheral  neuropathy, Pneumonia (2016), Presence of permanent cardiac  pacemaker, and Spinal headache.   She has a past surgical history that includes Cesarean section; Appendectomy (2009); turbinectomy (2009); ganglionic cyst (remote); tendon release surgery (Left); Colonoscopy; Tonsillectomy; Anterior cervical decomp/discectomy fusion (N/A, 10/21/2012); bi-ventricular implantable cardioverter defibrillator (N/A, 06/15/2014); blood clot removed from left top hand; Thoracotomy (Left, 11/30/2014); Epicardial pacing lead placement (N/A, 11/30/2014); Cardiac catheterization (01/13/05/2015); Cardiac catheterization; Esophagogastroduodenoscopy (egd) with propofol (N/A, 04/07/2017); Breast biopsy (Left, 12/17/2018); and Breast biopsy (Left, 12/17/2018).   Her family history includes Breast cancer (age of onset: 59) in her maternal grandmother; Cancer in her paternal grandfather; Cancer (age of onset: 76) in her mother; Cancer (age of onset: 21) in her maternal grandmother; Diabetes in her maternal grandmother; Pneumonia in her father; Supraventricular tachycardia in her daughter.She reports that she has never smoked. She has never used smokeless tobacco. She reports current alcohol use. She reports that she does not use drugs.  Outpatient Medications Prior to Visit  Medication Sig Dispense Refill   albuterol (PROVENTIL) (2.5 MG/3ML) 0.083% nebulizer solution INHALE 3 MILLILITERS (1 VIAL) BY NEBULIZER EVERY 6 HOURS AS NEEDED FOR WHEEZING OR SHORTNESS OF BREATH 150 mL 1   albuterol (VENTOLIN HFA) 108 (90 Base) MCG/ACT inhaler Inhale 2 puffs into the lungs every 6 (six) hours as needed for wheezing or shortness of breath. 8 g 2   baclofen (LIORESAL) 10 MG tablet Take 1 tablet (10 mg total) by mouth 2 (two) times daily. 60 each 1   cyanocobalamin (,VITAMIN B-12,) 1000 MCG/ML injection INJECT 1 ML INTO THE MUSCLE ONCE A WEEK 4 mL 1   dapagliflozin propanediol (FARXIGA) 10 MG TABS tablet Take 1 tablet (10 mg total) by mouth daily. 90 tablet 3   DULoxetine (CYMBALTA) 60 MG capsule TAKE ONE  CAPSULE BY MOUTH ONCE DAILY 30 capsule 4   EMGALITY 120 MG/ML SOAJ      ENTRESTO 24-26 MG Take 0.5 tablets by mouth 2 (two) times daily.     etodolac (LODINE) 500 MG tablet Take 500 mg by mouth 2 (two) times daily.     ezetimibe (ZETIA) 10 MG tablet Take 1 tablet (10 mg total) by mouth daily. 90 tablet 3   fluticasone (FLONASE) 50 MCG/ACT nasal spray USE 2 SPRAYS INTO BOTH NOSTRILS ONCE DAILY AS DIRECTED BY PHYSICIAN. 48 g 2   furosemide (LASIX) 20 MG tablet Take 1 tablet (20 mg total) by mouth daily. Take extra 20 mg as needed. 114 tablet 0   gabapentin (NEURONTIN) 100 MG capsule TAKE ONE CAPSULE BY MOUTH THREE TIMES DAILY 180 capsule 0   montelukast (SINGULAIR) 10 MG tablet TAKE (1) TABLET BY MOUTH EVERY DAY 90 tablet 3   potassium chloride (KLOR-CON) 10 MEQ tablet Take 2 tablets (20 mEq total) by mouth daily. 180 tablet 3   rosuvastatin (CRESTOR) 5 MG tablet Take 1 tablet (5 mg total) by mouth daily. 90 tablet 3   Syringe/Needle, Disp, (SYRINGE 3CC/25GX1") 25G X 1" 3 ML MISC Use to inject 1 mL of vitamin B12 intramuscular every 30 days. 50 each 0   traZODone (DESYREL) 50 MG tablet TAKE 1/2 TO 1 TABLET BY MOUTH AT BEDTIMEAS NEEDED FOR SLEEP 90 tablet 1   UBRELVY 100 MG TABS Take 1 tablet by mouth daily as needed.     valACYclovir (VALTREX) 1000 MG tablet Take 1 tablet (1,000 mg total) by mouth 2 (two) times daily as needed (fever blisters). 20 tablet 3   venlafaxine XR (EFFEXOR-XR) 150 MG 24 hr capsule  TAKE ONE CAPSULE BY MOUTH ONCE DAILY WITH BREAKFAST 90 capsule 1   cetirizine (ZYRTEC) 10 MG chewable tablet Chew 10 mg by mouth daily. (Patient not taking: Reported on 03/08/2021)     TROKENDI XR 50 MG CP24 Take 1 capsule by mouth daily. (Patient not taking: Reported on 01/14/2021)     No facility-administered medications prior to visit.    Review of Systems  Patient denies headache, fevers, malaise, unintentional weight loss, skin rash, eye pain, sinus congestion and sinus pain, sore throat,  dysphagia,  hemoptysis , cough, dyspnea, wheezing, chest pain, palpitations, orthopnea, edema, abdominal pain, nausea, melena, diarrhea, constipation, flank pain, dysuria, hematuria, urinary  Frequency, nocturia, numbness, tingling, seizures,  Focal weakness, Loss of consciousness,  Tremor, insomnia, depression, anxiety, and suicidal ideation.     Objective:  BP 104/66 (BP Location: Left Arm, Patient Position: Sitting, Cuff Size: Normal)    Pulse 79    Temp 98.3 F (36.8 C) (Oral)    Ht 5' 5.5" (1.664 m)    Wt 165 lb 9.6 oz (75.1 kg)    LMP 07/12/2016    SpO2 97%    BMI 27.14 kg/m   Physical Exam  General appearance: alert, cooperative and appears stated age Ears: normal TM's and external ear canals both ears Throat: lips, mucosa, and tongue normal; teeth and gums normal Neck: no adenopathy, no carotid bruit, supple, symmetrical, trachea midline and thyroid not enlarged, symmetric, no tenderness/mass/nodules Back: symmetric, no curvature. ROM normal. No CVA tenderness. Lungs: clear to auscultation bilaterally Heart: regular rate and rhythm, S1, S2 normal, no murmur, click, rub or gallop Abdomen: soft, non-tender; bowel sounds normal; no masses,  no organomegaly Pulses: 2+ and symmetric Skin: Skin color, texture, turgor normal. No rashes or lesions Lymph nodes: Cervical, supraclavicular, and axillary nodes normal.   Assessment & Plan:   Problem List Items Addressed This Visit     Fatigue   Relevant Orders   Thyroid Panel With TSH (Completed)   Cardiomyopathy, dilated, nonischemic (HCC)    Nonischemic, nonvalvular, With global hypokinesis,  EF 45% by June 2021 ECHO.  S/p biventricular pacer and AICD in 2016 for EF 25%  Completed cardiac rehab.  Tolerating Entresto,        Relevant Medications   ENTRESTO 24-26 MG   Visit for preventive health examination    Annual comprehensive preventive exam was done as well as an evaluation and management of chronic conditions .  During the course  of the visit the patient was educated and counseled about appropriate screening and preventive services including :  diabetes screening, lipid analysis with projected  10 year  risk for CAD , nutrition counseling, breast, cervical and colorectal cancer screening, and recommended immunizations.  Printed recommendations for health maintenance screenings was give      Encounter for preventive health examination    Annual comprehensive preventive exam was done as well as an evaluation and management of chronic conditions .  During the course of the visit the patient was educated and counseled about appropriate screening and preventive services including :  diabetes screening, lipid analysis with projected  10 year  risk for CAD , nutrition counseling, breast, cervical and colorectal cancer screening, and recommended immunizations.  Printed recommendations for health maintenance screenings was given      Other Visit Diagnoses     Hyperlipidemia, unspecified hyperlipidemia type    -  Primary   Relevant Medications   ENTRESTO 24-26 MG   Other Relevant Orders   Lipid  panel (Completed)   Impaired fasting glucose       Relevant Orders   Comprehensive metabolic panel (Completed)   Hemoglobin A1c (Completed)       I have discontinued Isidoro Donning. Rantz "Keane"'s Trokendi XR. I am also having her maintain her cetirizine, Emgality, albuterol, fluticasone, baclofen, valACYclovir, etodolac, SYRINGE 3CC/25GX1", ezetimibe, montelukast, Ubrelvy, traZODone, potassium chloride, albuterol, DULoxetine, furosemide, cyanocobalamin, dapagliflozin propanediol, venlafaxine XR, gabapentin, rosuvastatin, and Entresto.  No orders of the defined types were placed in this encounter.   Medications Discontinued During This Encounter  Medication Reason   TROKENDI XR 50 MG CP24     Follow-up: No follow-ups on file.   [name], MD

## 2021-03-09 LAB — LIPOPROTEIN A (LPA): Lipoprotein (a): 76.8 nmol/L — ABNORMAL HIGH (ref <75.0)

## 2021-03-09 LAB — THYROID PANEL WITH TSH
Free Thyroxine Index: 1.4 (ref 1.2–4.9)
T3 Uptake Ratio: 23 % — ABNORMAL LOW (ref 24–39)
T4, Total: 6 ug/dL (ref 4.5–12.0)
TSH: 0.468 u[IU]/mL (ref 0.450–4.500)

## 2021-03-09 NOTE — Assessment & Plan Note (Addendum)
Annual comprehensive preventive exam was done as well as an evaluation and management of chronic conditions .  During the course of the visit the patient was educated and counseled about appropriate screening and preventive services including :  diabetes screening, lipid analysis with projected  10 year  risk for CAD , nutrition counseling, breast, cervical and colorectal cancer screening, and recommended immunizations.  Printed recommendations for health maintenance screenings was given 

## 2021-03-09 NOTE — Assessment & Plan Note (Signed)
Annual comprehensive preventive exam was done as well as an evaluation and management of chronic conditions .  During the course of the visit the patient was educated and counseled about appropriate screening and preventive services including :  diabetes screening, lipid analysis with projected  10 year  risk for CAD , nutrition counseling, breast, cervical and colorectal cancer screening, and recommended immunizations.  Printed recommendations for health maintenance screenings was give 

## 2021-03-09 NOTE — Assessment & Plan Note (Signed)
Nonischemic, nonvalvular, With global hypokinesis,  EF 45% by June 2021 ECHO.  S/p biventricular pacer and AICD in 2016 for EF 25%  Completed cardiac rehab.  Nancee Liter,

## 2021-03-11 ENCOUNTER — Ambulatory Visit (INDEPENDENT_AMBULATORY_CARE_PROVIDER_SITE_OTHER): Payer: BC Managed Care – PPO

## 2021-03-11 DIAGNOSIS — I420 Dilated cardiomyopathy: Secondary | ICD-10-CM

## 2021-03-11 LAB — CUP PACEART REMOTE DEVICE CHECK
Battery Remaining Longevity: 4 mo
Battery Voltage: 2.8 V
Brady Statistic AP VP Percent: 2.77 %
Brady Statistic AP VS Percent: 0.12 %
Brady Statistic AS VP Percent: 95.86 %
Brady Statistic AS VS Percent: 1.26 %
Brady Statistic RA Percent Paced: 2.89 %
Brady Statistic RV Percent Paced: 2.61 %
Date Time Interrogation Session: 20230116044225
HighPow Impedance: 96 Ohm
Implantable Lead Implant Date: 20160421
Implantable Lead Implant Date: 20160421
Implantable Lead Implant Date: 20161006
Implantable Lead Implant Date: 20161006
Implantable Lead Location: 753858
Implantable Lead Location: 753858
Implantable Lead Location: 753859
Implantable Lead Location: 753860
Implantable Lead Model: 5071
Implantable Lead Model: 5071
Implantable Lead Model: 5076
Implantable Pulse Generator Implant Date: 20160421
Lead Channel Impedance Value: 285 Ohm
Lead Channel Impedance Value: 285 Ohm
Lead Channel Impedance Value: 380 Ohm
Lead Channel Impedance Value: 380 Ohm
Lead Channel Impedance Value: 4047 Ohm
Lead Channel Impedance Value: 4047 Ohm
Lead Channel Pacing Threshold Amplitude: 0.375 V
Lead Channel Pacing Threshold Amplitude: 1.25 V
Lead Channel Pacing Threshold Pulse Width: 0.4 ms
Lead Channel Pacing Threshold Pulse Width: 0.4 ms
Lead Channel Sensing Intrinsic Amplitude: 20.25 mV
Lead Channel Sensing Intrinsic Amplitude: 20.25 mV
Lead Channel Sensing Intrinsic Amplitude: 3.125 mV
Lead Channel Sensing Intrinsic Amplitude: 3.125 mV
Lead Channel Setting Pacing Amplitude: 1.5 V
Lead Channel Setting Pacing Amplitude: 2 V
Lead Channel Setting Pacing Amplitude: 2.5 V
Lead Channel Setting Pacing Pulse Width: 0.4 ms
Lead Channel Setting Pacing Pulse Width: 0.4 ms
Lead Channel Setting Sensing Sensitivity: 0.3 mV

## 2021-03-13 ENCOUNTER — Other Ambulatory Visit: Payer: Self-pay

## 2021-03-14 ENCOUNTER — Ambulatory Visit
Admission: RE | Admit: 2021-03-14 | Discharge: 2021-03-14 | Disposition: A | Discharge status: Home / Self Care | Payer: BC Managed Care – PPO | Source: Ambulatory Visit | Attending: Student | Admitting: Student

## 2021-03-14 ENCOUNTER — Other Ambulatory Visit: Payer: Self-pay

## 2021-03-14 DIAGNOSIS — M542 Cervicalgia: Secondary | ICD-10-CM | POA: Diagnosis not present

## 2021-03-20 NOTE — Progress Notes (Signed)
Remote ICD transmission.   

## 2021-03-25 ENCOUNTER — Telehealth: Payer: Self-pay | Admitting: Pharmacist

## 2021-03-25 NOTE — Telephone Encounter (Signed)
Called pt to review labs. LDL is at goal of <100 on rosuvastatin 5mg  daily and ezetimibe. Lab did not draw all the labs I had requested but LPa is only slighlt elevated. No additional risk.  LVM for pt to call back

## 2021-04-02 ENCOUNTER — Other Ambulatory Visit: Payer: Self-pay

## 2021-04-02 MED ORDER — EZETIMIBE 10 MG PO TABS: 10.0000 mg | ORAL_TABLET | Freq: Every day | ORAL | 3 refills | Status: DC

## 2021-04-08 ENCOUNTER — Ambulatory Visit (INDEPENDENT_AMBULATORY_CARE_PROVIDER_SITE_OTHER): Payer: BC Managed Care – PPO

## 2021-04-08 ENCOUNTER — Telehealth: Payer: Self-pay

## 2021-04-08 DIAGNOSIS — I5032 Chronic diastolic (congestive) heart failure: Secondary | ICD-10-CM | POA: Diagnosis not present

## 2021-04-08 DIAGNOSIS — Z95810 Presence of automatic (implantable) cardiac defibrillator: Secondary | ICD-10-CM | POA: Diagnosis not present

## 2021-04-08 NOTE — Telephone Encounter (Signed)
Remote ICM transmission received.  Attempted call to patient regarding ICM remote transmission and left detailed message per DPR.  Advised to return call.

## 2021-04-08 NOTE — Progress Notes (Signed)
EPIC Encounter for ICM Monitoring  Patient Name: Suzanne Spears is a 61 y.o. female Date: 04/08/2021 Primary Care Physican: [name], MD Primary Cardiologist: Rockey Situ Electrophysiologist: Vergie Living Pacing:  98.5%    03/06/2021 Weight: 160 lbs    Battery Longevity: 3 months            Attempted call to patient and unable to reach.  Left detailed message per DPR regarding transmission. Transmission reviewed.    OptiVol Thoracic impedance suggesting possible dryness starting 1/30.    Prescribed:  Furosemide 20 mg Take1 tablet (20 mg) once daily. May take extra 20 mg as needed for weight gain or swelling. Potassium 10 mEq take 2 tablets daily   Labs:   03/08/2021 Creatinine 1.14, BUN 27, Potassium 4.1, Sodium 140, GFR 55 12/27/2020 Creatinine 1.10, BUN 23, Potassium 4.1, Sodium 138, GFR 58 11/06/2020 Creatinine 1.15, BUN 20, Potassuim 3.8, Sodium 138, GFR 55 07/17/2020 Creatinine 1.62, BUN 21, Potassium 4.3, Sodium 141, GFR 36 05/02/2020 Creatinine 1.04, BUN 22, Potassium 3.7, Sodium 140, GFR >60  03/16/2020 Creatinine 1.26, BUN 32, Potassium 3.9, Sodium 142, GFR 49 03/02/2020 Creatinine 1.55, BUN 28, Potassium 4.7, Sodium 143, GFR 39 A complete set of results can be found in Results Review.   Recommendations:  Left voice mail with ICM number and encouraged to call if experiencing any fluid symptoms.   Follow-up plan: ICM clinic phone appointment on 04/15/2021 to recheck fluid levels.  91 day device clinic remote transmission due 06/13/2021.     EP/Cardiology Office Visits:  04/16/2021 with Dr. Rockey Situ.    Copy of ICM check sent to Dr. Caryl Comes.  Will send to Dr Rockey Situ for review if patient is reached.  3 month ICM trend: 04/08/2021.    12-14 Month ICM trend:     [name], RN 04/08/2021 12:40 PM

## 2021-04-09 ENCOUNTER — Telehealth: Payer: Self-pay

## 2021-04-09 ENCOUNTER — Other Ambulatory Visit: Payer: Self-pay | Admitting: Internal Medicine

## 2021-04-09 DIAGNOSIS — M719 Bursopathy, unspecified: Secondary | ICD-10-CM

## 2021-04-09 MED ORDER — ENTRESTO 24-26 MG PO TABS: 0.5000 | ORAL_TABLET | Freq: Two times a day (BID) | ORAL | 3 refills | Status: DC

## 2021-04-09 NOTE — Telephone Encounter (Signed)
Patient needing refill on entresto

## 2021-04-09 NOTE — Progress Notes (Signed)
Attempted call to patient and unable to reach.  Left message to return call.  

## 2021-04-11 ENCOUNTER — Ambulatory Visit (INDEPENDENT_AMBULATORY_CARE_PROVIDER_SITE_OTHER): Payer: BC Managed Care – PPO

## 2021-04-11 DIAGNOSIS — I255 Ischemic cardiomyopathy: Secondary | ICD-10-CM

## 2021-04-11 LAB — CUP PACEART REMOTE DEVICE CHECK
Battery Remaining Longevity: 3 mo
Battery Voltage: 2.79 V
Brady Statistic AP VP Percent: 5.93 %
Brady Statistic AP VS Percent: 0.16 %
Brady Statistic AS VP Percent: 92.71 %
Brady Statistic AS VS Percent: 1.2 %
Brady Statistic RA Percent Paced: 6.09 %
Brady Statistic RV Percent Paced: 5.03 %
Date Time Interrogation Session: 20230216001707
HighPow Impedance: 90 Ohm
Implantable Lead Implant Date: 20160421
Implantable Lead Implant Date: 20160421
Implantable Lead Implant Date: 20161006
Implantable Lead Implant Date: 20161006
Implantable Lead Location: 753858
Implantable Lead Location: 753858
Implantable Lead Location: 753859
Implantable Lead Location: 753860
Implantable Lead Model: 5071
Implantable Lead Model: 5071
Implantable Lead Model: 5076
Implantable Pulse Generator Implant Date: 20160421
Lead Channel Impedance Value: 323 Ohm
Lead Channel Impedance Value: 323 Ohm
Lead Channel Impedance Value: 399 Ohm
Lead Channel Impedance Value: 399 Ohm
Lead Channel Impedance Value: 4047 Ohm
Lead Channel Impedance Value: 4047 Ohm
Lead Channel Pacing Threshold Amplitude: 0.5 V
Lead Channel Pacing Threshold Amplitude: 1.25 V
Lead Channel Pacing Threshold Pulse Width: 0.4 ms
Lead Channel Pacing Threshold Pulse Width: 0.4 ms
Lead Channel Sensing Intrinsic Amplitude: 26.25 mV
Lead Channel Sensing Intrinsic Amplitude: 26.25 mV
Lead Channel Sensing Intrinsic Amplitude: 3.375 mV
Lead Channel Sensing Intrinsic Amplitude: 3.375 mV
Lead Channel Setting Pacing Amplitude: 1.5 V
Lead Channel Setting Pacing Amplitude: 2 V
Lead Channel Setting Pacing Amplitude: 2.5 V
Lead Channel Setting Pacing Pulse Width: 0.4 ms
Lead Channel Setting Pacing Pulse Width: 0.4 ms
Lead Channel Setting Sensing Sensitivity: 0.3 mV

## 2021-04-15 ENCOUNTER — Other Ambulatory Visit: Payer: Self-pay | Admitting: Internal Medicine

## 2021-04-15 ENCOUNTER — Ambulatory Visit (INDEPENDENT_AMBULATORY_CARE_PROVIDER_SITE_OTHER): Payer: BC Managed Care – PPO

## 2021-04-15 DIAGNOSIS — J4520 Mild intermittent asthma, uncomplicated: Secondary | ICD-10-CM

## 2021-04-15 DIAGNOSIS — Z95810 Presence of automatic (implantable) cardiac defibrillator: Secondary | ICD-10-CM

## 2021-04-15 DIAGNOSIS — I5032 Chronic diastolic (congestive) heart failure: Secondary | ICD-10-CM
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
## 2021-04-15 NOTE — Progress Notes (Signed)
EPIC Encounter for ICM Monitoring  Patient Name: Suzanne Spears is a 61 y.o. female Date: 04/15/2021 Primary Care Physican: [name], MD Primary Cardiologist: Rockey Situ Electrophysiologist: Vergie Living Pacing:  98.5%    03/06/2021 Weight: 160 lbs    Battery Longevity: 3 months            Spoke with patient and heart failure questions reviewed.  Pt asymptomatic for fluid accumulation. She held Furosemide x 3-4 days due to dryness and has taken one this past weekend.   She has changed her diet starting mid January and eliminating or decreasing the foods that are high in salt which may be contribute to the change in impedance from overload to suggested dryness.     OptiVol Thoracic impedance returned to normal 2/15 but starting upward trending suggesting possible dryness is returning.     Prescribed:  Furosemide 20 mg Take1 tablet (20 mg) once daily. May take extra 20 mg as needed for weight gain or swelling. Potassium 10 mEq take 2 tablets daily   Labs:   03/08/2021 Creatinine 1.14, BUN 27, Potassium 4.1, Sodium 140, GFR 55 12/27/2020 Creatinine 1.10, BUN 23, Potassium 4.1, Sodium 138, GFR 58 11/06/2020 Creatinine 1.15, BUN 20, Potassuim 3.8, Sodium 138, GFR 55 07/17/2020 Creatinine 1.62, BUN 21, Potassium 4.3, Sodium 141, GFR 36 05/02/2020 Creatinine 1.04, BUN 22, Potassium 3.7, Sodium 140, GFR >60  03/16/2020 Creatinine 1.26, BUN 32, Potassium 3.9, Sodium 142, GFR 49 03/02/2020 Creatinine 1.55, BUN 28, Potassium 4.7, Sodium 143, GFR 39 A complete set of results can be found in Results Review.   Recommendations:  Advised to drink approximately 64 oz fluid daily and continue to limit salt.  She will discuss with Dr Rockey Situ at 2/21 OV if Lasix dosage needs to be adjusted since she has adjusted diet to eating more healthy.    Follow-up plan: ICM clinic phone appointment on 04/22/2021 to recheck fluid levels.  91 day device clinic remote transmission due 06/13/2021.     EP/Cardiology  Office Visits:  04/16/2021 with Dr. Rockey Situ.    Copy of ICM check sent to Dr. Caryl Comes and Dr Rockey Situ as Juluis Rainier for 2/21 appointment.  3 month ICM trend: 04/15/2021.    12-14 Month ICM trend:     [name], RN 04/15/2021 10:00 AM

## 2021-04-16 ENCOUNTER — Ambulatory Visit: Payer: BC Managed Care – PPO | Admitting: Cardiovascular Disease

## 2021-04-16 ENCOUNTER — Encounter: Payer: Self-pay | Admitting: Cardiovascular Disease

## 2021-04-16 ENCOUNTER — Other Ambulatory Visit: Payer: Self-pay

## 2021-04-16 VITALS — BP 104/60 | HR 85 | Ht 65.5 in | Wt 158.5 lb

## 2021-04-16 DIAGNOSIS — Z13220 Encounter for screening for lipoid disorders: Secondary | ICD-10-CM

## 2021-04-16 DIAGNOSIS — I420 Dilated cardiomyopathy: Secondary | ICD-10-CM | POA: Diagnosis not present

## 2021-04-16 DIAGNOSIS — I5032 Chronic diastolic (congestive) heart failure: Secondary | ICD-10-CM

## 2021-04-16 DIAGNOSIS — Z95810 Presence of automatic (implantable) cardiac defibrillator: Secondary | ICD-10-CM

## 2021-04-16 DIAGNOSIS — N289 Disorder of kidney and ureter, unspecified: Secondary | ICD-10-CM

## 2021-04-16 DIAGNOSIS — I255 Ischemic cardiomyopathy: Secondary | ICD-10-CM

## 2021-04-16 NOTE — Progress Notes (Signed)
Remote ICD transmission.   

## 2021-04-16 NOTE — Patient Instructions (Signed)
Medication Instructions:  No changes  If you need a refill on your cardiac medications before your next appointment, please call your pharmacy.   Lab work: No new labs needed  Testing/Procedures: No new testing needed  Follow-Up: At [facility], you and your health needs are our priority.  As part of our continuing mission to provide you with exceptional heart care, we have created designated Provider Care Teams.  These Care Teams include your primary Cardiologist (physician) and Advanced Practice Providers (APPs -  Physician Assistants and Nurse Practitioners) who all work together to provide you with the care you need, when you need it.  You will need a follow up appointment in 12 months  Providers on your designated Care Team:   Christopher Berge, NP Ryan Dunn, PA-C Cadence Furth, PA-C  COVID-19 Vaccine Information can be found at: [email or web address] For questions related to vaccine distribution or appointments, please email [email or web address] or call [phone].   

## 2021-04-22 ENCOUNTER — Ambulatory Visit (INDEPENDENT_AMBULATORY_CARE_PROVIDER_SITE_OTHER): Payer: BC Managed Care – PPO

## 2021-04-22 DIAGNOSIS — Z95810 Presence of automatic (implantable) cardiac defibrillator: Secondary | ICD-10-CM

## 2021-04-22 DIAGNOSIS — I5032 Chronic diastolic (congestive) heart failure: Secondary | ICD-10-CM

## 2021-04-24 NOTE — Progress Notes (Signed)
EPIC Encounter for ICM Monitoring  Patient Name: Suzanne Spears is a 61 y.o. female Date: 04/24/2021 Primary Care Physican: [name], MD Primary Cardiologist: Rockey Situ Electrophysiologist: Vergie Living Pacing:  98.4%    03/06/2021 Weight: 160 lbs    Battery Longevity: 3 months            Spoke with patient and heart failure questions reviewed.  Pt asymptomatic for fluid accumulation.  Reports feeling well at this time and voices no complaints.    OptiVol Thoracic impedance normal fluid levels.     Prescribed:  Furosemide 20 mg Take1 tablet (20 mg) once daily. May take extra 20 mg as needed for weight gain or swelling. Potassium 10 mEq take 2 tablets daily   Labs:   03/08/2021 Creatinine 1.14, BUN 27, Potassium 4.1, Sodium 140, GFR 55 12/27/2020 Creatinine 1.10, BUN 23, Potassium 4.1, Sodium 138, GFR 58 11/06/2020 Creatinine 1.15, BUN 20, Potassuim 3.8, Sodium 138, GFR 55 07/17/2020 Creatinine 1.62, BUN 21, Potassium 4.3, Sodium 141, GFR 36 05/02/2020 Creatinine 1.04, BUN 22, Potassium 3.7, Sodium 140, GFR >60  03/16/2020 Creatinine 1.26, BUN 32, Potassium 3.9, Sodium 142, GFR 49 03/02/2020 Creatinine 1.55, BUN 28, Potassium 4.7, Sodium 143, GFR 39 A complete set of results can be found in Results Review.   Recommendations:  No changes and encouraged to call if experiencing any fluid symptoms.   Follow-up plan: ICM clinic phone appointment on 05/13/2021  91 day device clinic remote transmission due 06/13/2021.     EP/Cardiology Office Visits: 07/09/2021 with Dr Caryl Comes.    Copy of ICM check sent to Dr. Caryl Comes.  3 month ICM trend: 04/22/2021.    12-14 Month ICM trend:     [name], RN 04/24/2021 10:23 AM

## 2021-05-08 ENCOUNTER — Encounter: Payer: Self-pay | Admitting: Internal Medicine

## 2021-05-08 ENCOUNTER — Other Ambulatory Visit: Payer: Self-pay | Admitting: Internal Medicine

## 2021-05-08 DIAGNOSIS — Z1211 Encounter for screening for malignant neoplasm of colon: Secondary | ICD-10-CM

## 2021-05-08 NOTE — Telephone Encounter (Signed)
Referral has been pended for [facility].  ?

## 2021-05-10 ENCOUNTER — Telehealth: Payer: Self-pay

## 2021-05-10 NOTE — Telephone Encounter (Signed)
CALLED PATIENT NO ANSWER LEFT VOICEMAIL FOR A CALL BACK ? ?

## 2021-05-13 ENCOUNTER — Ambulatory Visit (INDEPENDENT_AMBULATORY_CARE_PROVIDER_SITE_OTHER): Payer: BC Managed Care – PPO

## 2021-05-13 ENCOUNTER — Other Ambulatory Visit: Payer: Self-pay

## 2021-05-13 DIAGNOSIS — Z95810 Presence of automatic (implantable) cardiac defibrillator: Secondary | ICD-10-CM | POA: Diagnosis not present

## 2021-05-13 DIAGNOSIS — I420 Dilated cardiomyopathy: Secondary | ICD-10-CM

## 2021-05-13 DIAGNOSIS — Z1211 Encounter for screening for malignant neoplasm of colon: Secondary | ICD-10-CM

## 2021-05-13 DIAGNOSIS — I5032 Chronic diastolic (congestive) heart failure: Secondary | ICD-10-CM

## 2021-05-13 LAB — CUP PACEART REMOTE DEVICE CHECK
Date Time Interrogation Session: 20230320063952
Implantable Lead Implant Date: 20160421
Implantable Lead Implant Date: 20160421
Implantable Lead Implant Date: 20161006
Implantable Lead Implant Date: 20161006
Implantable Lead Location: 753858
Implantable Lead Location: 753858
Implantable Lead Location: 753859
Implantable Lead Location: 753860
Implantable Lead Model: 5071
Implantable Lead Model: 5071
Implantable Lead Model: 5076
Implantable Pulse Generator Implant Date: 20160421

## 2021-05-13 MED ORDER — NA SULFATE-K SULFATE-MG SULF 17.5-3.13-1.6 GM/177ML PO SOLN: 1.0000 | Freq: Once | ORAL | 0 refills | Status: AC

## 2021-05-13 NOTE — Progress Notes (Signed)
Gastroenterology Pre-Procedure Review ? ?Request Date: 06/10/2021 ?Requesting Physician: Dr. Minshall Norris ? ?PATIENT REVIEW QUESTIONS: The patient responded to the following health history questions as indicated:   ? ?1. Are you having any GI issues? no ?2. Do you have a personal history of Polyps?  No, last colonoscopy 11/2010. ?3. Do you have a family history of Colon Cancer or Polyps? no ?4. Diabetes Mellitus? no ?5. Joint replacements in the past 12 months?no ?6. Major health problems in the past 3 months?no ?7. Any artificial heart valves, MVP, or defibrillator?yes (S/P ICD defibrillator.) ?   ?MEDICATIONS & ALLERGIES:    ?Patient reports the following regarding taking any anticoagulation/antiplatelet therapy:   ?Plavix, Coumadin, Eliquis, Xarelto, Lovenox, Pradaxa, Brilinta, or Effient? no ?Aspirin? no ? ?Patient confirms/reports the following medications:  ?Current Outpatient Medications  ?Medication Sig Dispense Refill  ? AIMOVIG 140 MG/ML SOAJ SMARTSIG:140 Milligram(s) SUB-Q Every 4 Weeks    ? albuterol (PROVENTIL) (2.5 MG/3ML) 0.083% nebulizer solution INHALE 3 MILLILITERS (1 VIAL) BY NEBULIZER EVERY 6 HOURS AS NEEDED FOR WHEEZING OR SHORTNESS OF BREATH 150 mL 1  ? albuterol (VENTOLIN HFA) 108 (90 Base) MCG/ACT inhaler INHALE TWO PUFFS BY MOUTH INTO THE LUNGS EVERY 6 HOURS AS NEEDED FOR WHEEZING OR SHORTNESS OF BREATH 8.5 g 2  ? baclofen (LIORESAL) 10 MG tablet Take 1 tablet (10 mg total) by mouth 2 (two) times daily. 60 each 1  ? cetirizine (ZYRTEC) 10 MG chewable tablet Chew 10 mg by mouth daily.    ? cyanocobalamin (,VITAMIN B-12,) 1000 MCG/ML injection INJECT 1 ML INTO THE MUSCLE ONCE A WEEK 4 mL 1  ? dapagliflozin propanediol (FARXIGA) 10 MG TABS tablet Take 1 tablet (10 mg total) by mouth daily. 90 tablet 3  ? DULoxetine (CYMBALTA) 60 MG capsule TAKE ONE CAPSULE BY MOUTH ONCE DAILY 30 capsule 4  ? EMGALITY 120 MG/ML SOAJ  (Patient not taking: Reported on 04/16/2021)    ? ENTRESTO 24-26 MG Take 0.5 tablets by  mouth 2 (two) times daily. 60 tablet 3  ? etodolac (LODINE) 500 MG tablet Take 500 mg by mouth 2 (two) times daily.    ? ezetimibe (ZETIA) 10 MG tablet Take 1 tablet (10 mg total) by mouth daily. 90 tablet 3  ? fluticasone (FLONASE) 50 MCG/ACT nasal spray USE 2 SPRAYS INTO BOTH NOSTRILS ONCE DAILY AS DIRECTED BY PHYSICIAN. 48 g 2  ? furosemide (LASIX) 20 MG tablet Take 1 tablet (20 mg total) by mouth daily. Take extra 20 mg as needed. 114 tablet 0  ? gabapentin (NEURONTIN) 100 MG capsule TAKE ONE CAPSULE BY MOUTH THREE TIMES DAILY 180 capsule 0  ? montelukast (SINGULAIR) 10 MG tablet TAKE (1) TABLET BY MOUTH EVERY DAY 90 tablet 3  ? potassium chloride (KLOR-CON) 10 MEQ tablet Take 2 tablets (20 mEq total) by mouth daily. 180 tablet 3  ? rosuvastatin (CRESTOR) 5 MG tablet Take 1 tablet (5 mg total) by mouth daily. 90 tablet 3  ? Syringe/Needle, Disp, (SYRINGE 3CC/25GX1") 25G X 1" 3 ML MISC Use to inject 1 mL of vitamin B12 intramuscular every 30 days. 50 each 0  ? traMADol (ULTRAM) 50 MG tablet Take 50 mg by mouth every 6 (six) hours as needed.    ? traZODone (DESYREL) 50 MG tablet TAKE 1/2 TO 1 TABLET BY MOUTH AT BEDTIMEAS NEEDED FOR SLEEP 90 tablet 1  ? UBRELVY 100 MG TABS Take 1 tablet by mouth daily as needed.    ? valACYclovir (VALTREX) 1000 MG tablet Take  1 tablet (1,000 mg total) by mouth 2 (two) times daily as needed (fever blisters). 20 tablet 3  ? venlafaxine XR (EFFEXOR-XR) 150 MG 24 hr capsule TAKE ONE CAPSULE BY MOUTH ONCE DAILY WITH BREAKFAST 90 capsule 1  ? ?No current facility-administered medications for this visit.  ? ? ?Patient confirms/reports the following allergies:  ?Allergies  ?Allergen Reactions  ? Adhesive [Tape] Rash  ?  Pulls skin off  ? Carvedilol Swelling and Other (See Comments)  ?  headache ?swelling  ? Metoprolol Swelling and Other (See Comments)  ?  Swelling and headache  ? Penicillin G Swelling  ?  Other reaction(s): Localized superficial swelling of skin  ? Lisinopril Cough  ?  Prednisone   ? Latex Rash  ? Levofloxacin Rash  ? ? ?No orders of the defined types were placed in this encounter. ? ? ?AUTHORIZATION INFORMATION ?Primary Insurance: ?1D#: ?Group #: ? ?Secondary Insurance: ?1D#: ?Group #: ? ?SCHEDULE INFORMATION: ?Date: 06/10/2021 ?Time: ?Location: [place] ? ?

## 2021-05-14 ENCOUNTER — Other Ambulatory Visit: Payer: Self-pay | Admitting: Internal Medicine

## 2021-05-15 ENCOUNTER — Telehealth: Payer: Self-pay

## 2021-05-15 ENCOUNTER — Other Ambulatory Visit: Payer: Self-pay | Admitting: Internal Medicine

## 2021-05-15 NOTE — Telephone Encounter (Signed)
Remote ICM transmission received.  Attempted call to patient regarding ICM remote transmission and left detailed message per DPR.  Advised to return call for any fluid symptoms or questions. Next ICM remote transmission scheduled 06/17/2021.   ? ?

## 2021-05-15 NOTE — Progress Notes (Signed)
EPIC Encounter for ICM Monitoring ? ?Patient Name: Suzanne Spears is a 61 y.o. female ?Date: 05/15/2021 ?Primary Care Physican: [name], MD ?Primary Cardiologist: Rockey Situ ?Electrophysiologist: Caryl Comes ?Bi-V Pacing:  98.4%    ?03/06/2021 Weight: 160 lbs  ?  ?Battery Longevity: 2 months ?  ?  ?       Attempted call to patient and unable to reach.  Left detailed message per DPR regarding transmission. Transmission reviewed.  ?  ?OptiVol Thoracic impedance normal fluid levels.   ?  ?Prescribed:  ?Furosemide 20 mg Take1 tablet (20 mg) once daily. May take extra 20 mg as needed for weight gain or swelling. ?Potassium 10 mEq take 2 tablets daily ?  ?Labs:   ?03/08/2021 Creatinine 1.14, BUN 27, Potassium 4.1, Sodium 140, GFR 55 ?12/27/2020 Creatinine 1.10, BUN 23, Potassium 4.1, Sodium 138, GFR 58 ?11/06/2020 Creatinine 1.15, BUN 20, Potassuim 3.8, Sodium 138, GFR 55 ?A complete set of results can be found in Results Review. ?  ?Recommendations:  Left voice mail with ICM number and encouraged to call if experiencing any fluid symptoms. ?  ?Follow-up plan: ICM clinic phone appointment on 06/17/2021  91 day device clinic remote transmission due 06/13/2021.   ?  ?EP/Cardiology Office Visits: 07/09/2021 with Dr Caryl Comes.  ?  ?Copy of ICM check sent to Dr. Caryl Comes. ? ?3 month ICM trend: 05/15/2021. ? ? ? ?12-14 Month ICM trend:  ? ? ? ?[name], RN ?05/15/2021 ?2:15 PM ? ?

## 2021-05-17 ENCOUNTER — Telehealth: Payer: Self-pay | Admitting: Cardiovascular Disease

## 2021-05-17 DIAGNOSIS — I5032 Chronic diastolic (congestive) heart failure: Secondary | ICD-10-CM

## 2021-05-17 MED ORDER — FUROSEMIDE 20 MG PO TABS: 20.0000 mg | ORAL_TABLET | Freq: Every day | ORAL | 3 refills | Status: DC

## 2021-05-17 NOTE — Telephone Encounter (Signed)
Requested Prescriptions  ? ?Signed Prescriptions Disp Refills  ? furosemide (LASIX) 20 MG tablet 135 tablet 3  ?  Sig: Take 1 tablet (20 mg total) by mouth daily. Take extra 20 mg as needed.  ?  Authorizing Provider: Minna Merritts  ?  Ordering User: Othelia Pulling C  ? ? ?

## 2021-05-17 NOTE — Telephone Encounter (Signed)
?*  STAT* If patient is at the pharmacy, call can be transferred to refill team. ? ? ?1. Which medications need to be refilled? (please list name of each medication and dose if known) furosemide (LASIX) 20 MG 1 tablet daily  ? ?2. Which pharmacy/location (including street and city if local pharmacy) is medication to be sent to? [facility] on [address]  ? ?3. Do they need a 30 day or 90 day supply? 90 day  ? ?

## 2021-05-20 ENCOUNTER — Telehealth: Payer: Self-pay | Admitting: Gastroenterology

## 2021-05-20 NOTE — Telephone Encounter (Signed)
Patient left vm requesting call back to reschedule colonoscopy. Requesting call back. ?

## 2021-05-21 ENCOUNTER — Telehealth: Payer: Self-pay

## 2021-05-21 NOTE — Telephone Encounter (Signed)
Called and got patient rescheduled called endo and sent new refferal to [facility] and new communications out  ?

## 2021-05-21 NOTE — Progress Notes (Signed)
Remote ICD transmission.   

## 2021-05-30 ENCOUNTER — Telehealth: Payer: Self-pay | Admitting: Internal Medicine

## 2021-05-30 NOTE — Telephone Encounter (Signed)
? ?  Pre-operative Risk Assessment  ?  ?Patient Name: Suzanne Spears  ?DOB: 10-16-1960 ?MRN: [id number]  ? ?  ? ?Request for Surgical Clearance   ? ?Procedure:  Colonoscopy ? ?Date of Surgery:  Clearance 07/12/21                              ?   ?Surgeon:  not listed ?Surgeon's Group or Practice Name:  [facility] ?Phone number:  [phone] ?Fax number:  [phone] ?  ?Type of Clearance Requested:   ?- Medical  ?  ?Type of Anesthesia:  General  ?  ?Additional requests/questions:   ? ?Signed, ?Pilar A Ham   ?05/30/2021, 3:18 PM   ?

## 2021-05-30 NOTE — Progress Notes (Signed)
RESENT PROCEDURE CLEARANCE ?

## 2021-05-31 NOTE — Telephone Encounter (Signed)
I s/w the pt today and she is agreeable to plan of care for in office appt with Dr. Rockey Situ for pre op clearance. Appt is 06/24/21 @ 8:40.  ?

## 2021-05-31 NOTE — Telephone Encounter (Signed)
? ?  Name: Suzanne Spears  ?DOB: 1960/07/15  ?MRN: [id number] ? ?Primary Cardiologist: Ida Rogue, MD ? ?Chart reviewed as part of pre-operative protocol coverage. Because of Suzanne Spears's past medical history and time since last visit, she will require a follow-up visit in order to better assess preoperative cardiovascular risk. ? ?Pre-op covering staff: ?- Please schedule appointment and call patient to inform them. If patient already had an upcoming appointment within acceptable timeframe, please add "pre-op clearance" to the appointment notes so provider is aware. ?- Please contact requesting surgeon's office via preferred method (i.e, phone, fax) to inform them of need for appointment prior to surgery. ? ?If applicable, this message will also be routed to pharmacy pool and/or primary cardiologist for input on holding anticoagulant/antiplatelet agent as requested below so that this information is available to the clearing provider at time of patient's appointment.  ? ?Suzanne Sciara, NP  ?05/31/2021, 12:41 PM  ? ?

## 2021-06-11 NOTE — Progress Notes (Signed)
Resent blood thinner clearance ?

## 2021-06-13 ENCOUNTER — Ambulatory Visit (INDEPENDENT_AMBULATORY_CARE_PROVIDER_SITE_OTHER): Payer: BC Managed Care – PPO | Admitting: Internal Medicine

## 2021-06-13 ENCOUNTER — Encounter: Payer: Self-pay | Admitting: Internal Medicine

## 2021-06-13 ENCOUNTER — Telehealth: Payer: Self-pay | Admitting: Internal Medicine

## 2021-06-13 ENCOUNTER — Ambulatory Visit (INDEPENDENT_AMBULATORY_CARE_PROVIDER_SITE_OTHER): Payer: BC Managed Care – PPO

## 2021-06-13 VITALS — BP 120/70 | HR 79 | Ht 65.5 in | Wt 163.4 lb

## 2021-06-13 DIAGNOSIS — I5032 Chronic diastolic (congestive) heart failure: Secondary | ICD-10-CM

## 2021-06-13 DIAGNOSIS — Z95810 Presence of automatic (implantable) cardiac defibrillator: Secondary | ICD-10-CM | POA: Diagnosis not present

## 2021-06-13 DIAGNOSIS — I428 Other cardiomyopathies: Secondary | ICD-10-CM

## 2021-06-13 DIAGNOSIS — I255 Ischemic cardiomyopathy: Secondary | ICD-10-CM | POA: Diagnosis not present

## 2021-06-13 DIAGNOSIS — Z01812 Encounter for preprocedural laboratory examination: Secondary | ICD-10-CM

## 2021-06-13 DIAGNOSIS — Z79899 Other long term (current) drug therapy: Secondary | ICD-10-CM | POA: Diagnosis not present

## 2021-06-13 LAB — CUP PACEART REMOTE DEVICE CHECK
Battery Remaining Longevity: 1 mo
Battery Voltage: 2.77 V
Brady Statistic AP VP Percent: 2.18 %
Brady Statistic AP VS Percent: 0.07 %
Brady Statistic AS VP Percent: 96.51 %
Brady Statistic AS VS Percent: 1.24 %
Brady Statistic RA Percent Paced: 2.25 %
Brady Statistic RV Percent Paced: 3.79 %
Date Time Interrogation Session: 20230420001705
HighPow Impedance: 87 Ohm
Implantable Lead Implant Date: 20160421
Implantable Lead Implant Date: 20160421
Implantable Lead Implant Date: 20161006
Implantable Lead Implant Date: 20161006
Implantable Lead Location: 753858
Implantable Lead Location: 753858
Implantable Lead Location: 753859
Implantable Lead Location: 753860
Implantable Lead Model: 5071
Implantable Lead Model: 5071
Implantable Lead Model: 5076
Implantable Pulse Generator Implant Date: 20160421
Lead Channel Impedance Value: 323 Ohm
Lead Channel Impedance Value: 323 Ohm
Lead Channel Impedance Value: 399 Ohm
Lead Channel Impedance Value: 399 Ohm
Lead Channel Impedance Value: 4047 Ohm
Lead Channel Impedance Value: 4047 Ohm
Lead Channel Pacing Threshold Amplitude: 0.5 V
Lead Channel Pacing Threshold Amplitude: 1.125 V
Lead Channel Pacing Threshold Pulse Width: 0.4 ms
Lead Channel Pacing Threshold Pulse Width: 0.4 ms
Lead Channel Sensing Intrinsic Amplitude: 24.375 mV
Lead Channel Sensing Intrinsic Amplitude: 24.375 mV
Lead Channel Sensing Intrinsic Amplitude: 3.125 mV
Lead Channel Sensing Intrinsic Amplitude: 3.125 mV
Lead Channel Setting Pacing Amplitude: 1.5 V
Lead Channel Setting Pacing Amplitude: 2 V
Lead Channel Setting Pacing Amplitude: 2.5 V
Lead Channel Setting Pacing Pulse Width: 0.4 ms
Lead Channel Setting Pacing Pulse Width: 0.4 ms
Lead Channel Setting Sensing Sensitivity: 0.3 mV

## 2021-06-13 MED ORDER — SPIRONOLACTONE 25 MG PO TABS: 25.0000 mg | ORAL_TABLET | Freq: Every day | ORAL | 3 refills | Status: DC

## 2021-06-13 NOTE — Telephone Encounter (Signed)
Patient will get labs same day as 5/4 echo [facility].  ?

## 2021-06-13 NOTE — Patient Instructions (Signed)
Medication Instructions:  ?- Your physician has recommended you make the following change in your medication:  ? ?1) START spironolactone 25 mg: ?- take 1 tablet by mouth once daily  ? ?*If you need a refill on your cardiac medications before your next appointment, please call your pharmacy* ? ? ?Lab Work: ?- Your physician recommends that you return for lab work: about 2 weeks- BMP/ CBC ? ?[facility] En[facility] at [facility] ?1st desk on the right to check in ?Lab hours: Monday- Friday (7:30 am- 5:30 pm) ? ? ?If you have labs (blood work) drawn today and your tests are completely normal, you will receive your results only by: ?MyChart Message (if you have MyChart) OR ?A paper copy in the mail ?If you have any lab test that is abnormal or we need to change your treatment, we will call you to review the results. ? ? ?Testing/Procedures: ? ?1)Echocardiogram: ?- Your physician has requested that you have an echocardiogram. Echocardiography is a painless test that uses sound waves to create images of your heart. It provides your doctor with information about the size and shape of your heart and how well your heart?s chambers and valves are working. This procedure takes approximately one hour. There are no restrictions for this procedure. There is a possibility that an IV may need to be started during your test to inject an image enhancing agent. This is done to obtain more optimal pictures of your heart. Therefore we ask that you do at least drink some water prior to coming in to hydrate your veins.  ? ? ? ?Follow-Up: ?At [facility], you and your health needs are our priority.  As part of our continuing mission to provide you with exceptional heart care, we have created designated Provider Care Teams.  These Care Teams include your primary Cardiologist (physician) and Advanced Practice Providers (APPs -  Physician Assistants and Nurse Practitioners) who all work together to provide you with the care you need, when you  need it. ? ?We recommend signing up for the patient portal called "MyChart".  Sign up information is provided on this After Visit Summary.  MyChart is used to connect with patients for Virtual Visits (Telemedicine).  Patients are able to view lab/test results, encounter notes, upcoming appointments, etc.  Non-urgent messages can be sent to your provider as well.   ?To learn more about what you can do with MyChart, go to [email or web address].   ? ?Your next appointment:   ?Pending date of generator changeout  ? ?The format for your next appointment:   ?In Person ? ?Provider:   ?Virl Axe, MD  ? ? ?Other Instructions ? ?Spironolactone Tablets ?What is this medication? ?SPIRONOLACTONE (speer on [place] LAK tone) treats high blood pressure and heart failure. It may also be used to reduce swelling related to heart, kidney, or liver disease. It helps your kidneys remove more fluid and salt from your blood through the urine without losing too much potassium. It belongs to a group of medications called diuretics. ?This medicine may be used for other purposes; ask your health care provider or pharmacist if you have questions. ?COMMON BRAND NAME(S): Aldactone ?What should I tell my care team before I take this medication? ?They need to know if you have any of these conditions: ?Addison's disease or low adrenal gland function ?High blood level of potassium ?Kidney disease ?Liver disease ?An unusual or allergic reaction to spironolactone, other medications, foods, dyes, or preservatives ?Pregnant or trying to get pregnant ?Breast-feeding ?  How should I use this medication? ?Take this medication by mouth. Take it as directed on the prescription label at the same time every day. You can take it with or without food. You should always take it the same way. Keep taking it unless your care team tells you to stop. ?Talk to your care team about the use of this medication in children. Special care may be needed. ?Overdosage: If you  think you have taken too much of this medicine contact a poison control center or emergency room at once. ?NOTE: This medicine is only for you. Do not share this medicine with others. ?What if I miss a dose? ?If you miss a dose, take it as soon as you can. If it is almost time for your next dose, take only that dose. Do not take double or extra doses. ?What may interact with this medication? ?Do not take this medication with any of the following: ?Cidofovir ?Eplerenone ?Tranylcypromine ?This medication may also interact with the following: ?Aspirin ?Certain medications for blood pressure or heart disease like benazepril, lisinopril, losartan, valsartan ?Certain medications that treat or prevent blood clots like heparin and enoxaparin ?Cholestyramine ?Cyclosporine ?Digoxin ?Lithium ?Medications that relax muscles for surgery ?NSAIDs, medications for pain and inflammation, like ibuprofen or naproxen ?Other diuretics ?Potassium salts or supplements ?Steroid medications like prednisone or cortisone ?Trimethoprim ?This list may not describe all possible interactions. Give your health care provider a list of all the medicines, herbs, non-prescription drugs, or dietary supplements you use. Also tell them if you smoke, drink alcohol, or use illegal drugs. Some items may interact with your medicine. ?What should I watch for while using this medication? ?Visit your care team for regular checks on your progress. Check your blood pressure as directed. Ask your care team what your blood pressure should be. Also, find out when you should contact him or her. ?Do not treat yourself for coughs, colds, or pain while you are using this medication without asking your care team for advice. Some medications may increase your blood pressure. ?Check with your care team if you have severe diarrhea, nausea, and vomiting, or if you sweat a lot. The loss of too much body fluid may make it dangerous for you to take this medication. ?You may  need to be on a special diet while taking this medication. Ask your care team. Also, find out how many glasses of fluid you need to drink each day. ?You may get drowsy or dizzy. Do not drive, use machinery, or do anything that needs mental alertness until you know how this medication affects you. Do not stand or sit up quickly, especially if you are an older patient. This reduces the risk of dizzy or fainting spells. Alcohol may interfere with the effects of this medication. Avoid alcoholic drinks. ?Avoid salt substitutes unless you are told otherwise by your care team. ?What side effects may I notice from receiving this medication? ?Side effects that you should report to your care team as soon as possible: ?Allergic reactions--skin rash, itching, hives, swelling of the face, lips, tongue, or throat ?Dehydration--increased thirst, dry mouth, feeling faint or lightheaded, headache, dark yellow or brown urine ?High potassium level--muscle weakness, fast or irregular heartbeat ?Kidney injury--decrease in the amount of urine, swelling of the ankles, hands, or feet ?Low blood pressure--dizziness, feeling faint or lightheaded, blurry vision ?Low sodium level--muscle weakness, fatigue, dizziness, headache, confusion ?Side effects that usually do not require medical attention (report to your care team if they continue or  are bothersome): ?Breast pain or tenderness ?Changes in sex drive or performance ?Dizziness ?Headache ?Irregular menstrual cycles or spotting ?Unexpected breast tissue growth ?This list may not describe all possible side effects. Call your doctor for medical advice about side effects. You may report side effects to FDA at 1-800-FDA-1088. ?Where should I keep my medication? ?Keep out of the reach of children and pets. ?Store below 25 degrees C (77 degrees F). Get rid of any unused medication after the expiration date. ?To get rid of medications that are no longer needed or have expired: ?Take the medication  to a medication take-back program. Check with your pharmacy or law enforcement to find a location. ?If you cannot return the medication, check the label or package insert to see if the medication should be thr
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
## 2021-06-17 ENCOUNTER — Ambulatory Visit (INDEPENDENT_AMBULATORY_CARE_PROVIDER_SITE_OTHER): Payer: BC Managed Care – PPO

## 2021-06-17 DIAGNOSIS — Z95810 Presence of automatic (implantable) cardiac defibrillator: Secondary | ICD-10-CM | POA: Diagnosis not present

## 2021-06-17 DIAGNOSIS — I5032 Chronic diastolic (congestive) heart failure: Secondary | ICD-10-CM

## 2021-06-19 ENCOUNTER — Telehealth: Payer: Self-pay

## 2021-06-19 NOTE — Telephone Encounter (Signed)
Remote ICM transmission received.  Attempted call to patient regarding ICM remote transmission and left detailed message per DPRl.  Advised to return call for any fluid symptoms or questions. Next ICM remote transmission scheduled 07/29/2021.   ? ?

## 2021-06-19 NOTE — Progress Notes (Signed)
EPIC Encounter for ICM Monitoring ? ?Patient Name: Suzanne Spears is a 61 y.o. female ?Date: 06/19/2021 ?Primary Care Physican: [name], MD ?Primary Cardiologist: Rockey Situ ?Electrophysiologist: Caryl Comes ?Bi-V Pacing:  98.6%    ?03/06/2021 Weight: 160 lbs  ?  ?Battery Longevity: 1 months ?  ?  ?       Attempted call to patient and unable to reach.  Left detailed message per DPR regarding transmission. Transmission reviewed.  ?  ?OptiVol Thoracic impedance normal fluid levels.   ?  ?Prescribed:  ?Furosemide 20 mg Take1 tablet (20 mg) once daily. May take extra 20 mg as needed for weight gain or swelling. ?Potassium 10 mEq take 2 tablets daily ?  ?Labs:   ?03/08/2021 Creatinine 1.14, BUN 27, Potassium 4.1, Sodium 140, GFR 55 ?A complete set of results can be found in Results Review. ?  ?Recommendations:  Left voice mail with ICM number and encouraged to call if experiencing any fluid symptoms. ?  ?Follow-up plan: ICM clinic phone appointment on 07/29/2021.  91 day device clinic remote transmission due 09/16/2021.   ?  ?EP/Cardiology Office Visits:  06/24/2021 with Dr Rockey Situ ?  ?Copy of ICM check sent to Dr. Caryl Comes. ? ?3 month ICM trend: 06/17/2021. ? ? ? ?12-14 Month ICM trend:  ? ? ? ?[name], RN ?06/19/2021 ?2:38 PM ? ?
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
## 2021-06-24 ENCOUNTER — Ambulatory Visit: Payer: BC Managed Care – PPO | Admitting: Cardiovascular Disease

## 2021-06-24 ENCOUNTER — Encounter: Payer: Self-pay | Admitting: Cardiovascular Disease

## 2021-06-24 VITALS — BP 126/70 | HR 88 | Ht 65.5 in | Wt 159.5 lb

## 2021-06-24 DIAGNOSIS — I5032 Chronic diastolic (congestive) heart failure: Secondary | ICD-10-CM

## 2021-06-24 DIAGNOSIS — Z95810 Presence of automatic (implantable) cardiac defibrillator: Secondary | ICD-10-CM | POA: Diagnosis not present

## 2021-06-24 DIAGNOSIS — I428 Other cardiomyopathies: Secondary | ICD-10-CM | POA: Diagnosis not present

## 2021-06-24 DIAGNOSIS — N289 Disorder of kidney and ureter, unspecified: Secondary | ICD-10-CM | POA: Diagnosis not present

## 2021-06-24 DIAGNOSIS — I341 Nonrheumatic mitral (valve) prolapse: Secondary | ICD-10-CM

## 2021-06-24 MED ORDER — ENTRESTO 24-26 MG PO TABS: 0.5000 | ORAL_TABLET | Freq: Two times a day (BID) | ORAL | 3 refills | Status: DC

## 2021-06-24 NOTE — Patient Instructions (Signed)
Medication Instructions:  No changes  If you need a refill on your cardiac medications before your next appointment, please call your pharmacy.   Lab work: No new labs needed  Testing/Procedures: No new testing needed  Follow-Up: At [facility], you and your health needs are our priority.  As part of our continuing mission to provide you with exceptional heart care, we have created designated Provider Care Teams.  These Care Teams include your primary Cardiologist (physician) and Advanced Practice Providers (APPs -  Physician Assistants and Nurse Practitioners) who all work together to provide you with the care you need, when you need it.  You will need a follow up appointment in 12 months  Providers on your designated Care Team:   Christopher Berge, NP Ryan Dunn, PA-C Cadence Furth, PA-C  COVID-19 Vaccine Information can be found at: [email or web address] For questions related to vaccine distribution or appointments, please email [email or web address] or call [phone].   

## 2021-06-25 ENCOUNTER — Telehealth: Payer: Self-pay

## 2021-06-25 ENCOUNTER — Telehealth: Payer: Self-pay | Admitting: Cardiovascular Disease

## 2021-06-25 NOTE — Telephone Encounter (Signed)
Called patient we received her  clearance back she is good to have procedure done patient understands ?

## 2021-06-25 NOTE — Telephone Encounter (Signed)
? ? ?  Patient Name: Suzanne Spears  ?DOB: Mar 07, 1960 ?MRN: [id number] ? ?Primary Cardiologist: Ida Rogue, MD ? ?Chart reviewed as part of pre-operative protocol coverage. Given past medical history and time since last visit, based on ACC/AHA guidelines, Suzanne Spears would be at acceptable risk for the planned procedure without further cardiovascular testing.  ? ?The patient was advised that if she develops new symptoms prior to surgery to contact our office to arrange for a follow-up visit, and she verbalized understanding. ? ?I will route this recommendation to the requesting party via Epic fax function and remove from pre-op pool. ? ?Please call with questions. ? ?Jimmy Picket, NP ?06/25/2021, 1:39 PM  ?

## 2021-06-25 NOTE — Telephone Encounter (Signed)
   Pre-operative Risk Assessment    Patient Name: Suzanne Spears  DOB: 09/23/1960 MRN: [id number]{ HEARTCARE STAFF-IMPORTANT INSTRUCTIONS 1 Red and Blue Text will auto delete once note is signed or closed. 2 Press F2 to navigate through template.   3 On drop down lists, L click to select >> R click to activate next field 4 Reason for Visit format is IMPORTANT!!  See Directions on No. 2 below. 5 Please review chart to determine if there is already a clearance note open for this procedure!!  DO NOT duplicate if a note already exists!!         Request for Surgical Clearance{ 1. What type of surgery is being performed? Enter name of procedure below and number of teeth if dental extraction.    Procedure:   COLONOSCOPY  2. When is this surgery scheduled? Press F2 to enter date below and place date in Reason for Visit (see directions below).  Date of Surgery:  Clearance 07/12/21                              For convenience, highlight and copy (CTL+C) the Clearance MM/DD/YY phrase above. Click here to go to Reason for Visit.  Paste (CTL+V) the date.  Merchandiser, retail.  Then click button underneath called Add Clearance MM/DD/YY as free text.        3. What is the name of the Surgeon, the Surgeon's Group or Practice, phone and fax number?  Press F2 and list below  Surgeon:  NOT INDICATED Surgeon's Group or Practice Name:  [facility] Phone number:  [phone] Fax number:  [phone] 7. What type of clearance is requested?  Medical or Cardiac Clearance only?  Pharmacy Clearance Only (Request is to hold medication only)?  Or Both?  Press F2 and select the clearance requested.  If both are needed, select both from the drop down list.      Type of Clearance Requested:   - Medical   5. What type of anesthesia will be used?  Press F2 and select the anesthesia to be used for the procedure.   Type of Anesthesia:  General  6. Are there any other requests or questions from the surgeon?     Additional  requests/questions:    Signed, Eli Phillips   06/25/2021, 12:44 PM

## 2021-06-25 NOTE — Telephone Encounter (Signed)
Notes have been faxed to requesting office 

## 2021-06-25 NOTE — Telephone Encounter (Signed)
Resent clearance ?

## 2021-06-26 ENCOUNTER — Other Ambulatory Visit: Payer: Self-pay | Admitting: Neurology

## 2021-06-26 DIAGNOSIS — G43119 Migraine with aura, intractable, without status migrainosus: Secondary | ICD-10-CM

## 2021-06-27 ENCOUNTER — Encounter: Payer: Self-pay | Admitting: Gastroenterology

## 2021-06-27 ENCOUNTER — Ambulatory Visit (INDEPENDENT_AMBULATORY_CARE_PROVIDER_SITE_OTHER): Payer: BC Managed Care – PPO

## 2021-06-27 ENCOUNTER — Other Ambulatory Visit
Admission: RE | Admit: 2021-06-27 | Discharge: 2021-06-27 | Disposition: A | Discharge status: Home / Self Care | Payer: BC Managed Care – PPO | Source: Ambulatory Visit | Attending: Internal Medicine | Admitting: Internal Medicine

## 2021-06-27 DIAGNOSIS — Z79899 Other long term (current) drug therapy: Secondary | ICD-10-CM | POA: Diagnosis present

## 2021-06-27 DIAGNOSIS — I5032 Chronic diastolic (congestive) heart failure: Secondary | ICD-10-CM | POA: Diagnosis not present

## 2021-06-27 DIAGNOSIS — I428 Other cardiomyopathies: Secondary | ICD-10-CM | POA: Diagnosis not present

## 2021-06-27 DIAGNOSIS — Z01812 Encounter for preprocedural laboratory examination: Secondary | ICD-10-CM | POA: Insufficient documentation

## 2021-06-27 LAB — ECHOCARDIOGRAM COMPLETE
AR max vel: 2.29 cm2
AV Area VTI: 2.05 cm2
AV Area mean vel: 2.15 cm2
AV Mean grad: 9 mmHg
AV Peak grad: 14.9 mmHg
AV Vena cont: 0.5 cm
Ao pk vel: 1.93 m/s
Area-P 1/2: 3.95 cm2
Calc EF: 42.3 %
MV VTI: 2.89 cm2
P 1/2 time: 459 msec
S' Lateral: 3.8 cm
Single Plane A2C EF: 43.3 %
Single Plane A4C EF: 40.9 %

## 2021-06-27 LAB — BASIC METABOLIC PANEL
Anion gap: 5 (ref 5–15)
BUN: 32 mg/dL — ABNORMAL HIGH (ref 6–20)
CO2: 28 mmol/L (ref 22–32)
Calcium: 9.2 mg/dL (ref 8.9–10.3)
Chloride: 106 mmol/L (ref 98–111)
Creatinine, Ser: 1.37 mg/dL — ABNORMAL HIGH (ref 0.44–1.00)
GFR, Estimated: 44 mL/min — ABNORMAL LOW (ref >60)
Glucose, Bld: 88 mg/dL (ref 70–99)
Potassium: 4.6 mmol/L (ref 3.5–5.1)
Sodium: 139 mmol/L (ref 135–145)

## 2021-06-27 LAB — CBC
HCT: 45 % (ref 36.0–46.0)
Hemoglobin: 14.5 g/dL (ref 12.0–15.0)
MCH: 30.5 pg (ref 26.0–34.0)
MCHC: 32.2 g/dL (ref 30.0–36.0)
MCV: 94.7 fL (ref 80.0–100.0)
Platelets: 207 10*3/uL (ref 150–400)
RBC: 4.75 MIL/uL (ref 3.87–5.11)
RDW: 14.6 % (ref 11.5–15.5)
WBC: 7.6 10*3/uL (ref 4.0–10.5)
nRBC: 0 % (ref 0.0–0.2)

## 2021-06-28 NOTE — Progress Notes (Signed)
Remote ICD transmission.   

## 2021-07-01 NOTE — Anesthesia Preprocedure Evaluation (Addendum)
Anesthesia Evaluation  Patient identified by MRN, date of birth, ID band Patient awake    Reviewed: Allergy & Precautions, NPO status , Patient's Chart, lab work & pertinent test results  History of Anesthesia Complications (+) POST - OP SPINAL HEADACHE and history of anesthetic complications  Airway Mallampati: I   Neck ROM: Full    Dental no notable dental hx.    Pulmonary asthma ,    Pulmonary exam normal breath sounds clear to auscultation       Cardiovascular hypertension, +CHF (2/2 NICM)  Normal cardiovascular exam+ pacemaker + Cardiac Defibrillator  Rhythm:Regular Rate:Normal  ECG 06/24/21: V-paced   Neuro/Psych  Headaches, PSYCHIATRIC DISORDERS Anxiety Depression    GI/Hepatic GERD  ,  Endo/Other  negative endocrine ROS  Renal/GU negative Renal ROS     Musculoskeletal   Abdominal   Peds  Hematology negative hematology ROS (+)   Anesthesia Other Findings Cardiology note 06/24/21:  Cardiomyopathy, dilated, nonischemic ([place])- Dating back to 2016, presumed to be viral cardiomyopathy/nonischemic Unable to exclude component of stress cardiomyopathy  echocardiogram ejection fraction 50-55% in 2018  echocardiogram June 2021 ejection fraction 40 to 45% We recommended she stay on current doses Entresto 24/26 mg BID, Lasix daily, Farxiga 10 mg daily, spironolactone Taking Lasix approximately every other day  Localized edema -  No significant edema on today's visit Uses compression hose, leg elevation, Lasix approximately every other day  Systolic and diastolic CHF EF 40, 2 years ago  Maintain on Entresto Lasix, Farxiga 10 mg daily spironolactone Repeat echocardiogram ordered per Dr. Caryl Comes Beta-blocker again offered, she is concerned about fatigue Appears relatively euvolemic  S/P ICD (internal cardiac defibrillator) procedure - Followed by Dr. Caryl Comes Epicardial lead placement  OptiVol measurements  reviewed, running low Needs generator change  Chest pain Atypical in nature No recent symptoms, no further testing at this time  Hyperlipidemia LDL improved from 185 down to 92 on Crestor Zetia LP(a) elevated  Reproductive/Obstetrics                            Anesthesia Physical Anesthesia Plan  ASA: 3  Anesthesia Plan: General   Post-op Pain Management:    Induction: Intravenous  PONV Risk Score and Plan: 3 and Propofol infusion, TIVA and Treatment may vary due to age or medical condition  Airway Management Planned: Natural Airway  Additional Equipment:   Intra-op Plan:   Post-operative Plan:   Informed Consent: I have reviewed the patients History and Physical, chart, labs and discussed the procedure including the risks, benefits and alternatives for the proposed anesthesia with the patient or authorized representative who has indicated his/her understanding and acceptance.       Plan Discussed with: CRNA  Anesthesia Plan Comments: (LMA/GETA backup discussed.  Patient consented for risks of anesthesia including but not limited to:  - adverse reactions to medications - damage to eyes, teeth, lips or other oral mucosa - nerve damage due to positioning  - sore throat or hoarseness - damage to heart, brain, nerves, lungs, other parts of body or loss of life  Informed patient about role of CRNA in peri- and intra-operative care.  Patient voiced understanding.)        Anesthesia Quick Evaluation

## 2021-07-03 ENCOUNTER — Telehealth: Payer: Self-pay

## 2021-07-03 NOTE — Telephone Encounter (Signed)
Called to reschedule patient to [facility] patient did not answer left voicemail ?

## 2021-07-04 ENCOUNTER — Encounter: Payer: Self-pay | Admitting: Internal Medicine

## 2021-07-04 ENCOUNTER — Telehealth: Payer: Self-pay

## 2021-07-04 ENCOUNTER — Telehealth: Payer: Self-pay | Admitting: Cardiovascular Disease

## 2021-07-04 ENCOUNTER — Other Ambulatory Visit: Payer: Self-pay

## 2021-07-04 NOTE — Telephone Encounter (Signed)
Preoperative team, please reach out to patient and let her know that her procedure was rescheduled due to her pacemaker and equipment needed for safe colonoscopy.  It appears the original colonoscopy was scheduled at the wrong facility/place.  From a cardiac standpoint we have no further recommendations at this time. ? ?Jossie Ng. Jeniel Slauson NP-C ? ?  ?07/04/2021, 9:27 AM ?[place] ?[address] ?Office [phone] Fax [phone] ? ?

## 2021-07-04 NOTE — Telephone Encounter (Signed)
I spoke with the patient to inquire about her SOB. ?Per the patient, Dr. Caryl Comes asked her if she gets SOB at her last visit and she advised that she will sometimes get SOB with walking or exerting herself, but this is not new or different and does not occur all the time.  ? ?I have asked the patient to transmit to confirm the status on her battery.  ?She advise she will do this now. ? ?She is aware I will message our device team and ask them to let me know the status on her battery. ? ?Once received I will send to Dr. Rockey Situ to update him. ?She is aware that per a conversation Dr. Rockey Situ had with Dr. Lucien Norris this morning, she may still be able to have her procedure on 5/19 late afternoon. ? ?The patient voices understanding and is agreeable. ? ?She was very appreciative of the call back.  ? ?

## 2021-07-04 NOTE — Telephone Encounter (Signed)
Patient has approximately one month until elective replacement interval (ERI) then have additional 3 months to get device changed out. Even if she hits ERI before colonscopy device will not function any differently  ?

## 2021-07-04 NOTE — Telephone Encounter (Signed)
Minna Merritts, MD  ?SentWarnell Forester  2:49 PM  ?To: Emily Filbert, RN  ?Cc: Lucilla Lame, MD  ? ? ?  ?   ? ?Message ? ?Thank you Nazire Fruth for digging into the details  ?It would seem that she would be acceptable risk for colonoscopy on May 19 in [place] at [facility]  ?We do have plenty of time as detailed in the note as battery is not at [facility] and we do have time beyond that as detailed.   ?If she does have shortness of breath we have recommended she continue her Lasix daily with extra Lasix as needed  ?She has indicated no new significant symptoms which would place her at increased risk for anesthesia  ?Recent echocardiogram is unchanged from prior study 2 years ago  ?No new testing is needed  ?Thx  ?Esmond Plants  ? ?

## 2021-07-04 NOTE — Telephone Encounter (Signed)
Error. Encounter not needed.

## 2021-07-04 NOTE — Telephone Encounter (Signed)
Patient advise request sent by the patient this morning stating she was contacted by GI regarding her scheduled colonoscopy for 5/19. ?Message advised this will need to be rescheduled from [facility] to [facility] due to her device battery nearing replacement.  ? ?I was later contacted by Dr. Rockey Situ asking me to reach out to the patient: ?1) she has advised anesthesia she was short of breath ?2) update a battery check on her device. ? ?MD advised I would reach out to the patient to inquire about the status of her SOB. ?Will also ask her to transmit to document the status on her CRT-D battery. ? ? ?

## 2021-07-04 NOTE — Telephone Encounter (Signed)
Patient was told that due to her device being at [facility] the GI Doctor that was originally going to do the colonoscopy is no longer going to be doing the procedure. She would like to know why Dr. Rockey Situ cleared her to have the procedure but is now not going to have the procedure done.  ? ?She would like Dr. Donivan Scull office to reach out to the Anaesthesiologist to clear up the miscommunication  ?

## 2021-07-04 NOTE — Telephone Encounter (Signed)
Left message for the pt that she has been cleared from cardiac standpoint per pre op provider Coletta Memos, FNP. She can have colon done.  ? ?Per Coletta Memos, FNP: Her ERI isnt an issue. The GI office put her at a site that didn't have equipment to manage her pacemaker if needed.  Yes she is still cleared on our end. The GI need to get her scheduled at [facility].  We are not postponing her colonoscopy ? ?I left message that we have faxed over the clearance notes to Dr. Bazin Norris for her procedure.  ?

## 2021-07-04 NOTE — Telephone Encounter (Signed)
Note being routed to Drs. Gollan/ Wohl to update on the status of the patient's SOB and battery for her CRT- D.  ?

## 2021-07-04 NOTE — Telephone Encounter (Signed)
Contacted patient to inform her that her colonoscopy scheduled for 07/12/21 at [facility] will need to be rescheduled and moved to [facility] due to her having a pacemaker. ? ?She was very unhappy with this call.  She wanted to know why she could not have her colonoscopy as scheduled considering she has had her cardiac evaluation and was cleared by her cardiologist to have it at [facility]. ? ?I contacted Pryor Montes, RN to inquire.  Pryor Montes informed me that any patient with a pacemaker or defibrillator should always be scheduled at [facility] and this is because the instruments used and the electrical current of these instruments could interfere with the defibrillator.  Therefore it is for the safety of the patient to go to [facility]. ? ?Attempted to convey this to patient, but she was not receptive to this and requested to speak to someone over me. ? ?I then asked my office manager Ginger to assist with this call. ? ?Thanks, ?Sharyn Lull, CMA ?

## 2021-07-07 ENCOUNTER — Other Ambulatory Visit: Payer: Self-pay | Admitting: Family

## 2021-07-08 ENCOUNTER — Other Ambulatory Visit: Payer: Self-pay | Admitting: Family

## 2021-07-09 ENCOUNTER — Other Ambulatory Visit: Payer: Self-pay | Admitting: Family

## 2021-07-09 ENCOUNTER — Encounter: Payer: BC Managed Care – PPO | Admitting: Internal Medicine

## 2021-07-10 ENCOUNTER — Telehealth: Payer: Self-pay

## 2021-07-10 NOTE — Telephone Encounter (Signed)
Patient left vm requesting a call back. Has questions about her colonoscopy.  ?

## 2021-07-10 NOTE — Telephone Encounter (Signed)
Called patient and answered her question about how to find out what time to be at procedure  ?

## 2021-07-12 ENCOUNTER — Encounter
Admission: RE | Disposition: A | Discharge status: Home / Self Care | Payer: Self-pay | Source: Home / Self Care | Attending: Gastroenterology

## 2021-07-12 ENCOUNTER — Other Ambulatory Visit: Payer: Self-pay

## 2021-07-12 ENCOUNTER — Ambulatory Visit: Payer: BC Managed Care – PPO | Admitting: Anesthesiology

## 2021-07-12 ENCOUNTER — Ambulatory Visit
Admission: RE | Admit: 2021-07-12 | Discharge: 2021-07-12 | Disposition: A | Discharge status: Home / Self Care | Payer: BC Managed Care – PPO | Attending: Gastroenterology | Admitting: Gastroenterology

## 2021-07-12 ENCOUNTER — Encounter: Payer: Self-pay | Admitting: Gastroenterology

## 2021-07-12 DIAGNOSIS — D122 Benign neoplasm of ascending colon: Secondary | ICD-10-CM | POA: Insufficient documentation

## 2021-07-12 DIAGNOSIS — F32A Depression, unspecified: Secondary | ICD-10-CM | POA: Diagnosis not present

## 2021-07-12 DIAGNOSIS — K635 Polyp of colon: Secondary | ICD-10-CM

## 2021-07-12 DIAGNOSIS — I5042 Chronic combined systolic (congestive) and diastolic (congestive) heart failure: Secondary | ICD-10-CM | POA: Diagnosis not present

## 2021-07-12 DIAGNOSIS — Z95810 Presence of automatic (implantable) cardiac defibrillator: Secondary | ICD-10-CM | POA: Diagnosis not present

## 2021-07-12 DIAGNOSIS — K640 First degree hemorrhoids: Secondary | ICD-10-CM | POA: Diagnosis not present

## 2021-07-12 DIAGNOSIS — I110 Hypertensive heart disease with heart failure: Secondary | ICD-10-CM | POA: Insufficient documentation

## 2021-07-12 DIAGNOSIS — Z79899 Other long term (current) drug therapy: Secondary | ICD-10-CM | POA: Insufficient documentation

## 2021-07-12 DIAGNOSIS — K219 Gastro-esophageal reflux disease without esophagitis: Secondary | ICD-10-CM | POA: Diagnosis not present

## 2021-07-12 DIAGNOSIS — Z1211 Encounter for screening for malignant neoplasm of colon: Secondary | ICD-10-CM

## 2021-07-12 DIAGNOSIS — F419 Anxiety disorder, unspecified: Secondary | ICD-10-CM | POA: Diagnosis not present

## 2021-07-12 DIAGNOSIS — J45909 Unspecified asthma, uncomplicated: Secondary | ICD-10-CM | POA: Insufficient documentation

## 2021-07-12 DIAGNOSIS — E785 Hyperlipidemia, unspecified: Secondary | ICD-10-CM | POA: Insufficient documentation

## 2021-07-12 DIAGNOSIS — K5730 Diverticulosis of large intestine without perforation or abscess without bleeding: Secondary | ICD-10-CM | POA: Insufficient documentation

## 2021-07-12 HISTORY — PX: COLONOSCOPY WITH PROPOFOL: SHX5780

## 2021-07-12 SURGERY — COLONOSCOPY WITH PROPOFOL
Anesthesia: General

## 2021-07-12 MED ORDER — SODIUM CHLORIDE 0.9 % IV SOLN: INTRAVENOUS | Status: DC

## 2021-07-12 MED ORDER — PROPOFOL 500 MG/50ML IV EMUL
INTRAVENOUS | Status: DC | PRN
  Administered 2021-07-12: 150 ug/kg/min via INTRAVENOUS

## 2021-07-12 MED ORDER — PROPOFOL 10 MG/ML IV BOLUS
INTRAVENOUS | Status: DC | PRN
  Administered 2021-07-12: 100 mg via INTRAVENOUS

## 2021-07-12 MED ORDER — PROPOFOL 500 MG/50ML IV EMUL
INTRAVENOUS | Status: AC
  Filled 2021-07-12: qty 50

## 2021-07-12 NOTE — Transfer of Care (Signed)
Immediate Anesthesia Transfer of Care Note  Patient: Suzanne Spears  Procedure(s) Performed: COLONOSCOPY WITH PROPOFOL  Patient Location: PACU  Anesthesia Type:General  Level of Consciousness: awake, alert  and oriented  Airway & Oxygen Therapy: Patient Spontanous Breathing  Post-op Assessment: Report given to RN and Post -op Vital signs reviewed and stable  Post vital signs: Reviewed and stable  Last Vitals:  Vitals Value Taken Time  BP 103/64 07/12/21 1548  Temp    Pulse 85 07/12/21 1548  Resp 17 07/12/21 1548  SpO2 100 % 07/12/21 1548  Vitals shown include unvalidated device data.  Last Pain:  Vitals:   07/12/21 1548  TempSrc:   PainSc: 0-No pain         Complications: No notable events documented.

## 2021-07-12 NOTE — Op Note (Signed)
[facility] Patient Name: Suzanne Spears Procedure Date: 07/12/2021 3:12 PM MRN: [id number] Account #: [id number] Date of Birth: 1961-01-10 Admit Type: Outpatient Age: 61 Room: [facility] ENDO ROOM 3 Gender: Female Note Status: Finalized Instrument Name: Park Meo [id number] Procedure:             Colonoscopy Indications:           Screening for colorectal malignant neoplasm Providers:             Lucilla Lame MD, MD Medicines:             Propofol per Anesthesia Complications:         No immediate complications. Procedure:             Pre-Anesthesia Assessment:                        - Prior to the procedure, a History and Physical was                         performed, and patient medications and allergies were                         reviewed. The patient's tolerance of previous                         anesthesia was also reviewed. The risks and benefits                         of the procedure and the sedation options and risks                         were discussed with the patient. All questions were                         answered, and informed consent was obtained. Prior                         Anticoagulants: The patient has taken no previous                         anticoagulant or antiplatelet agents. ASA Grade                         Assessment: II - A patient with mild systemic disease.                         After reviewing the risks and benefits, the patient                         was deemed in satisfactory condition to undergo the                         procedure.                        After obtaining informed consent, the colonoscope was                         passed under direct vision. Throughout the procedure,  the patient's blood pressure, pulse, and oxygen                         saturations were monitored continuously. The                         Colonoscope was introduced through the anus and                          advanced to the the cecum, identified by appendiceal                         orifice and ileocecal valve. The colonoscopy was                         performed without difficulty. The patient tolerated                         the procedure well. The quality of the bowel                         preparation was good. Findings:      The perianal and digital rectal examinations were normal.      A 7 mm polyp was found in the ascending colon. The polyp was sessile.       The polyp was removed with a cold snare. Resection and retrieval were       complete.      A few small-mouthed diverticula were found in the entire colon.      Non-bleeding internal hemorrhoids were found during retroflexion. The       hemorrhoids were Grade I (internal hemorrhoids that do not prolapse). Impression:            - One 7 mm polyp in the ascending colon, removed with                         a cold snare. Resected and retrieved.                        - Diverticulosis in the entire examined colon.                        - Non-bleeding internal hemorrhoids. Recommendation:        - Discharge patient to home.                        - Resume previous diet.                        - Continue present medications.                        - If the pathology report reveals adenomatous tissue,                         then repeat the colonoscopy for surveillance in 5                         years. Procedure Code(s):     --- Professional ---  45385, Colonoscopy, flexible; with removal of                         tumor(s), polyp(s), or other lesion(s) by snare                         technique Diagnosis Code(s):     --- Professional ---                        Z12.11, Encounter for screening for malignant neoplasm                         of colon                        K63.5, Polyp of colon CPT copyright 2019 American Medical Association. All rights reserved. The codes documented in this  report are preliminary and upon coder review may  be revised to meet current compliance requirements. Lucilla Lame MD, MD 07/12/2021 3:44:42 PM This report has been signed electronically. Number of Addenda: 0 Note Initiated On: 07/12/2021 3:12 PM Scope Withdrawal Time: 0 hours 11 minutes 16 seconds  Total Procedure Duration: 0 hours 27 minutes 54 seconds  Estimated Blood Loss:  Estimated blood loss: none.      [facility]

## 2021-07-12 NOTE — Anesthesia Procedure Notes (Signed)
Date/Time: 07/12/2021 3:16 PM Performed by: Hedda Slade, CRNA Pre-anesthesia Checklist: Patient identified, Emergency Drugs available, Suction available and Patient being monitored Patient Re-evaluated:Patient Re-evaluated prior to induction Oxygen Delivery Method: Nasal cannula Preoxygenation: Pre-oxygenation with 100% oxygen Induction Type: IV induction Airway Equipment and Method: Bite block Placement Confirmation: positive ETCO2 Dental Injury: Teeth and Oropharynx as per pre-operative assessment
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
## 2021-07-13 NOTE — Anesthesia Postprocedure Evaluation (Signed)
Anesthesia Post Note  Patient: Suzanne Spears  Procedure(s) Performed: COLONOSCOPY WITH PROPOFOL  Patient location during evaluation: Endoscopy Anesthesia Type: General Level of consciousness: awake and alert Pain management: pain level controlled Vital Signs Assessment: post-procedure vital signs reviewed and stable Respiratory status: spontaneous breathing, nonlabored ventilation, respiratory function stable and patient connected to nasal cannula oxygen Cardiovascular status: blood pressure returned to baseline and stable Postop Assessment: no apparent nausea or vomiting Anesthetic complications: no   No notable events documented.   Last Vitals:  Vitals:   07/12/21 1548 07/12/21 1558  BP: 103/64 114/67  Pulse: 83   Resp: 19 20  Temp:    SpO2: 100% 100%    Last Pain:  Vitals:   07/12/21 1558  TempSrc:   PainSc: 0-No pain                 Martha Clan

## 2021-07-15 ENCOUNTER — Ambulatory Visit (INDEPENDENT_AMBULATORY_CARE_PROVIDER_SITE_OTHER): Payer: BC Managed Care – PPO

## 2021-07-15 DIAGNOSIS — I428 Other cardiomyopathies: Secondary | ICD-10-CM

## 2021-07-16 LAB — CUP PACEART REMOTE DEVICE CHECK
Battery Remaining Longevity: 1 mo
Battery Voltage: 2.75 V
Brady Statistic AP VP Percent: 1.25 %
Brady Statistic AP VS Percent: 0.05 %
Brady Statistic AS VP Percent: 97.34 %
Brady Statistic AS VS Percent: 1.36 %
Brady Statistic RA Percent Paced: 1.3 %
Brady Statistic RV Percent Paced: 10.55 %
Date Time Interrogation Session: 20230522044221
HighPow Impedance: 83 Ohm
Implantable Lead Implant Date: 20160421
Implantable Lead Implant Date: 20160421
Implantable Lead Implant Date: 20161006
Implantable Lead Implant Date: 20161006
Implantable Lead Location: 753858
Implantable Lead Location: 753858
Implantable Lead Location: 753859
Implantable Lead Location: 753860
Implantable Lead Model: 5071
Implantable Lead Model: 5071
Implantable Lead Model: 5076
Implantable Pulse Generator Implant Date: 20160421
Lead Channel Impedance Value: 285 Ohm
Lead Channel Impedance Value: 323 Ohm
Lead Channel Impedance Value: 342 Ohm
Lead Channel Impedance Value: 342 Ohm
Lead Channel Impedance Value: 4047 Ohm
Lead Channel Impedance Value: 4047 Ohm
Lead Channel Pacing Threshold Amplitude: 0.5 V
Lead Channel Pacing Threshold Amplitude: 1.125 V
Lead Channel Pacing Threshold Pulse Width: 0.4 ms
Lead Channel Pacing Threshold Pulse Width: 0.4 ms
Lead Channel Sensing Intrinsic Amplitude: 26.875 mV
Lead Channel Sensing Intrinsic Amplitude: 26.875 mV
Lead Channel Sensing Intrinsic Amplitude: 4.75 mV
Lead Channel Sensing Intrinsic Amplitude: 4.75 mV
Lead Channel Setting Pacing Amplitude: 1.5 V
Lead Channel Setting Pacing Amplitude: 2 V
Lead Channel Setting Pacing Amplitude: 2.5 V
Lead Channel Setting Pacing Pulse Width: 0.4 ms
Lead Channel Setting Pacing Pulse Width: 0.4 ms
Lead Channel Setting Sensing Sensitivity: 0.3 mV

## 2021-07-17 ENCOUNTER — Encounter: Payer: Self-pay | Admitting: Gastroenterology

## 2021-07-17 LAB — SURGICAL PATHOLOGY

## 2021-07-23 ENCOUNTER — Encounter: Payer: Self-pay | Admitting: *Deleted

## 2021-07-29 ENCOUNTER — Ambulatory Visit (INDEPENDENT_AMBULATORY_CARE_PROVIDER_SITE_OTHER): Payer: BC Managed Care – PPO

## 2021-07-29 DIAGNOSIS — I5032 Chronic diastolic (congestive) heart failure: Secondary | ICD-10-CM | POA: Diagnosis not present

## 2021-07-29 DIAGNOSIS — Z95810 Presence of automatic (implantable) cardiac defibrillator: Secondary | ICD-10-CM | POA: Diagnosis not present

## 2021-07-31 NOTE — Progress Notes (Signed)
Remote ICD transmission.   

## 2021-07-31 NOTE — Addendum Note (Signed)
Addended by: Cheri Kearns A on: 07/31/2021 01:09 PM   Modules accepted: Level of Service

## 2021-07-31 NOTE — Progress Notes (Signed)
EPIC Encounter for ICM Monitoring  Patient Name: Suzanne Spears is a 61 y.o. female Date: 07/31/2021 Primary Care Physican: [name], MD Primary Cardiologist: Rockey Situ Electrophysiologist: Vergie Living Pacing:  98.5%    07/31/2021 Weight: 156 lbs    Battery Longevity: 1 month            Spoke with patient and heart failure questions reviewed.  Pt asymptomatic for fluid accumulation.  Reports feeling well at this time and voices no complaints.  Discussed battery replacement.    OptiVol Thoracic impedance normal fluid levels.     Prescribed:  Furosemide 20 mg Take1 tablet (20 mg) once daily. May take extra 20 mg as needed for weight gain or swelling. Potassium 10 mEq take 2 tablets daily   Labs:   06/27/2021 Creatinine 1.37, BUN 32, Potassium 4.6, Sodium 139, GFR 44 03/08/2021 Creatinine 1.14, BUN 27, Potassium 4.1, Sodium 140, GFR 55 A complete set of results can be found in Results Review.   Recommendations:  No changes and encouraged to call if experiencing any fluid symptoms.   Follow-up plan: ICM clinic phone appointment on 09/02/2021.  91 day device clinic remote transmission due 09/16/2021.     EP/Cardiology Office Visits:  Recall 06/24/2022 with Dr Rockey Situ   Copy of ICM check sent to Dr. Caryl Comes.  3 month ICM trend: 07/29/2021.    12-14 Month ICM trend:     [name], RN 07/31/2021 12:18 PM

## 2021-08-08 ENCOUNTER — Telehealth: Payer: Self-pay

## 2021-08-08 NOTE — Telephone Encounter (Signed)
Pt called because her ICD alarmed. There is a transmission for 08/08/2021.

## 2021-08-08 NOTE — Telephone Encounter (Signed)
Alert remote reviewed. Normal device function.   The device reached RRT on 08/08/2021, sent to triage Next remote 08/15/2021.  Unsuccessful telephone encounter to patient to discuss device at RRT. Of note patient called this morning reporting her device alarming which is the RRT indicator. Per DPR, detailed message left on patient's VM. Routing to Dr. Olin Pia nurse as Juluis Rainier and scheduling. Informed patient in message that if daily alarming is bothersome she can call for appointment in device clinic to deactivate RRT alert.

## 2021-08-13 ENCOUNTER — Other Ambulatory Visit: Payer: Self-pay

## 2021-08-13 MED ORDER — TRAZODONE HCL 50 MG PO TABS: ORAL_TABLET | ORAL | 1 refills | Status: DC

## 2021-08-15 ENCOUNTER — Ambulatory Visit (INDEPENDENT_AMBULATORY_CARE_PROVIDER_SITE_OTHER): Payer: BC Managed Care – PPO

## 2021-08-15 DIAGNOSIS — I428 Other cardiomyopathies: Secondary | ICD-10-CM

## 2021-08-16 ENCOUNTER — Encounter: Payer: Self-pay | Admitting: Internal Medicine

## 2021-08-19 LAB — CUP PACEART REMOTE DEVICE CHECK
Battery Remaining Longevity: 1 mo
Battery Voltage: 2.71 V
Brady Statistic AP VP Percent: 3.33 %
Brady Statistic AP VS Percent: 0.1 %
Brady Statistic AS VP Percent: 95.34 %
Brady Statistic AS VS Percent: 1.23 %
Brady Statistic RA Percent Paced: 3.43 %
Brady Statistic RV Percent Paced: 4.44 %
Date Time Interrogation Session: 20230623183430
HighPow Impedance: 88 Ohm
Implantable Lead Implant Date: 20160421
Implantable Lead Implant Date: 20160421
Implantable Lead Implant Date: 20161006
Implantable Lead Implant Date: 20161006
Implantable Lead Location: 753858
Implantable Lead Location: 753858
Implantable Lead Location: 753859
Implantable Lead Location: 753860
Implantable Lead Model: 5071
Implantable Lead Model: 5071
Implantable Lead Model: 5076
Implantable Pulse Generator Implant Date: 20160421
Lead Channel Impedance Value: 323 Ohm
Lead Channel Impedance Value: 342 Ohm
Lead Channel Impedance Value: 380 Ohm
Lead Channel Impedance Value: 399 Ohm
Lead Channel Impedance Value: 4047 Ohm
Lead Channel Impedance Value: 4047 Ohm
Lead Channel Pacing Threshold Amplitude: 0.5 V
Lead Channel Pacing Threshold Amplitude: 1.25 V
Lead Channel Pacing Threshold Pulse Width: 0.4 ms
Lead Channel Pacing Threshold Pulse Width: 0.4 ms
Lead Channel Sensing Intrinsic Amplitude: 26.625 mV
Lead Channel Sensing Intrinsic Amplitude: 26.625 mV
Lead Channel Sensing Intrinsic Amplitude: 3.5 mV
Lead Channel Sensing Intrinsic Amplitude: 3.5 mV
Lead Channel Setting Pacing Amplitude: 1.5 V
Lead Channel Setting Pacing Amplitude: 2 V
Lead Channel Setting Pacing Amplitude: 2.5 V
Lead Channel Setting Pacing Pulse Width: 0.4 ms
Lead Channel Setting Pacing Pulse Width: 0.4 ms
Lead Channel Setting Sensing Sensitivity: 0.3 mV

## 2021-08-21 NOTE — Progress Notes (Signed)
Remote ICD transmission.   

## 2021-08-21 NOTE — Addendum Note (Signed)
Addended by: Cheri Kearns A on: 08/21/2021 04:10 PM   Modules accepted: Level of Service

## 2021-08-29 ENCOUNTER — Ambulatory Visit: Payer: BC Managed Care – PPO | Admitting: Internal Medicine

## 2021-08-29 ENCOUNTER — Encounter: Payer: Self-pay | Admitting: Internal Medicine

## 2021-08-29 VITALS — BP 130/73 | HR 85 | Ht 65.0 in | Wt 160.2 lb

## 2021-08-29 DIAGNOSIS — I420 Dilated cardiomyopathy: Secondary | ICD-10-CM | POA: Diagnosis not present

## 2021-08-29 DIAGNOSIS — I447 Left bundle-branch block, unspecified: Secondary | ICD-10-CM | POA: Diagnosis not present

## 2021-08-29 DIAGNOSIS — Z01812 Encounter for preprocedural laboratory examination: Secondary | ICD-10-CM

## 2021-08-29 DIAGNOSIS — I5022 Chronic systolic (congestive) heart failure: Secondary | ICD-10-CM | POA: Diagnosis not present

## 2021-08-29 NOTE — Patient Instructions (Signed)
Medication Instructions:  Your physician recommends that you continue on your current medications as directed. Please refer to the Current Medication list given to you today.  *If you need a refill on your cardiac medications before your next appointment, please call your pharmacy*   Lab Work:  CBC and BMET after August 1st 2023  If you have labs (blood work) drawn today and your tests are completely normal, you will receive your results only by: [place] (if you have MyChart) OR A paper copy in the mail If you have any lab test that is abnormal or we need to change your treatment, we will call you to review the results.   Testing/Procedures: Nira Conn will contact you to schedule your device generator change.  We will tentatively schedule for Thursday, October 24, 2021 at [facility].   Follow-Up: At [facility], you and your health needs are our priority.  As part of our continuing mission to provide you with exceptional heart care, we have created designated Provider Care Teams.  These Care Teams include your primary Cardiologist (physician) and Advanced Practice Providers (APPs -  Physician Assistants and Nurse Practitioners) who all work together to provide you with the care you need, when you need it.  We recommend signing up for the patient portal called "MyChart".  Sign up information is provided on this After Visit Summary.  MyChart is used to connect with patients for Virtual Visits (Telemedicine).  Patients are able to view lab/test results, encounter notes, upcoming appointments, etc.  Non-urgent messages can be sent to your provider as well.   To learn more about what you can do with MyChart, go to [email or web address].    Your next appointment:   To be scheduled - Dr Olin Pia scheduler will contact you.   Important Information About Sugar
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
## 2021-09-02 ENCOUNTER — Ambulatory Visit (INDEPENDENT_AMBULATORY_CARE_PROVIDER_SITE_OTHER): Payer: BC Managed Care – PPO

## 2021-09-02 DIAGNOSIS — I5022 Chronic systolic (congestive) heart failure: Secondary | ICD-10-CM | POA: Diagnosis not present

## 2021-09-02 DIAGNOSIS — Z95810 Presence of automatic (implantable) cardiac defibrillator: Secondary | ICD-10-CM

## 2021-09-03 ENCOUNTER — Telehealth: Payer: Self-pay

## 2021-09-03 NOTE — Telephone Encounter (Signed)
Spoke with patient.  Request to send manual remote transmission for fluid level check since automatic report was not received.  She will send tonight and advised will review tomorrow.

## 2021-09-04 NOTE — Progress Notes (Signed)
EPIC Encounter for ICM Monitoring  Patient Name: Suzanne Spears is a 61 y.o. female Date: 09/04/2021 Primary Care Physican: [name], MD Primary Cardiologist: Rockey Situ Electrophysiologist: Vergie Living Pacing:  98.3%    07/31/2021 Weight: 156 lbs  09/04/2021 Weight: 158 lbs   Battery Longevity: ERI reached and device replacement is being scheduled            Spoke with patient and heart failure questions reviewed.  Pt asymptomatic for fluid accumulation.  Pt has been eating foods high in salt and takes Furosemide as needed instead of daily.  She typically takes about every other day or less.    OptiVol Thoracic impedance suggesting possible fluid accumulation starting 6/6.   Fluid index greater than normal threshold starting 6/17.   Prescribed:  Furosemide 20 mg Take1 tablet (20 mg) once daily. May take extra 20 mg as needed for weight gain or swelling.  Pt reports 7/12 she does not take Furosemide daily but tries to take it every other day if needed. Potassium 10 mEq take 2 tablets daily   Labs:   06/27/2021 Creatinine 1.37, BUN 32, Potassium 4.6, Sodium 139, GFR 44 03/08/2021 Creatinine 1.14, BUN 27, Potassium 4.1, Sodium 140, GFR 55 A complete set of results can be found in Results Review.   Recommendations:  Advised to take Furosemide x 3 consecutive days.     Follow-up plan: ICM clinic phone appointment on 09/09/2021 (manual) to recheck fluid levels.  91 day device clinic remote transmission due 09/16/2021.     EP/Cardiology Office Visits:  Recall 06/24/2022 with Dr Rockey Situ   Copy of ICM check sent to Dr. Caryl Comes.   3 month ICM trend: 09/03/2021.    12-14 Month ICM trend:     [name], RN 09/04/2021 12:33 PM

## 2021-09-06 ENCOUNTER — Encounter: Payer: Self-pay | Admitting: *Deleted

## 2021-09-09 ENCOUNTER — Ambulatory Visit (INDEPENDENT_AMBULATORY_CARE_PROVIDER_SITE_OTHER): Payer: BC Managed Care – PPO

## 2021-09-09 DIAGNOSIS — Z95810 Presence of automatic (implantable) cardiac defibrillator: Secondary | ICD-10-CM

## 2021-09-09 DIAGNOSIS — I5022 Chronic systolic (congestive) heart failure: Secondary | ICD-10-CM

## 2021-09-09 NOTE — Progress Notes (Signed)
EPIC Encounter for ICM Monitoring  Patient Name: Suzanne Spears is a 61 y.o. female Date: 09/09/2021 Primary Care Physican: [name], MD Primary Cardiologist: Rockey Situ Electrophysiologist: Vergie Living Pacing:  97.6%    07/31/2021 Weight: 156 lbs  09/04/2021 Weight: 158 lbs 09/09/2021 Weight: 158 lbs   Battery Longevity: ERI reached and device replacement is scheduled 8/31            Spoke with patient and heart failure questions reviewed.  Pt weight is starting to return toward baseline since taking Furosemide daily x 3 consecutive days.  She reports not following low salt diet in last month.  Dr Caryl Comes discussed with her taking Furosemide PRN at last office visit.     OptiVol Thoracic impedance improving and trending back to baseline after taking Furosemide x 3 days.  (Decreased impedance started 6/6)   Fluid index greater than normal threshold starting 6/17.   Prescribed:  Furosemide 20 mg Take1 tablet (20 mg) once daily. May take extra 20 mg as needed for weight gain or swelling.  Pt reports 7/12 she does not take Furosemide daily but tries to take it every other day if needed. Potassium 10 mEq take 2 tablets daily   Labs:   06/27/2021 Creatinine 1.37, BUN 32, Potassium 4.6, Sodium 139, GFR 44 03/08/2021 Creatinine 1.14, BUN 27, Potassium 4.1, Sodium 140, GFR 55 A complete set of results can be found in Results Review.   Recommendation to take Furosemide x 2 more consecutive days and suggested to think about taking it twice a week on regular basis to help manage fluid levels.    Follow-up plan: ICM clinic phone appointment on 09/23/2021 (manual) to recheck fluid levels.  91 day device clinic remote transmission due 09/16/2021.     EP/Cardiology Office Visits:  Recall 06/24/2022 with Dr Rockey Situ   Copy of ICM check sent to Dr. Caryl Comes.  3 month ICM trend: 09/09/2021.    12-14 Month ICM trend:     [name], RN 09/09/2021 10:27 AM

## 2021-09-17 ENCOUNTER — Telehealth: Payer: Self-pay | Admitting: Internal Medicine

## 2021-09-17 ENCOUNTER — Other Ambulatory Visit: Payer: Self-pay | Admitting: *Deleted

## 2021-09-17 DIAGNOSIS — I5032 Chronic diastolic (congestive) heart failure: Secondary | ICD-10-CM

## 2021-09-17 MED ORDER — POTASSIUM CHLORIDE ER 10 MEQ PO TBCR: 20.0000 meq | EXTENDED_RELEASE_TABLET | Freq: Every day | ORAL | 3 refills | Status: DC

## 2021-09-17 NOTE — Telephone Encounter (Signed)
-----   Message from Emily Filbert, RN sent at 09/16/2021  1:56 PM EDT ----- Patient has a defib generator change on 8/31 with Dr. Caryl Comes.  Please call to arrange:  1) Wound check with the Device Clinic 10-14 days (from 8/31) 2) Dr. Caryl Comes- 91 days (from 8/31)  Thank you!

## 2021-09-17 NOTE — Telephone Encounter (Signed)
Left vm for patient to return the call to schedule follow ups.

## 2021-09-18 ENCOUNTER — Telehealth: Payer: Self-pay | Admitting: Internal Medicine

## 2021-09-18 MED ORDER — "SYRINGE 25G X 1"" 3 ML MISC": 0 refills | Status: DC

## 2021-09-18 NOTE — Telephone Encounter (Signed)
Patient called because her prescription has expired and will not let her refill on line - Syringe/Needle, Disp, (SYRINGE 3CC/25GX1") 25G X 1" 3 ML MISC Update Pharmacy: [facility] ([place]) Phillip Heal

## 2021-09-18 NOTE — Telephone Encounter (Signed)
Refilled

## 2021-09-18 NOTE — Telephone Encounter (Signed)
Called patient to inform her that a year supply of medication was sent yesterday to her preferred pharmacy on file. Patient verbalized understanding.

## 2021-09-18 NOTE — Telephone Encounter (Signed)
*  STAT* If patient is at the pharmacy, call can be transferred to refill team.   1. Which medications need to be refilled? (please list name of each medication and dose if known) potassium chloride (KLOR-CON) 10 MEQ tablet  2. Which pharmacy/location (including street and city if local pharmacy) is medication to be sent to? [facility] [place], [address] [facility] [address]  3. Do they need a 30 day or 90 day supply? [place]

## 2021-09-19 NOTE — Telephone Encounter (Signed)
Pt returning a call to [place]

## 2021-09-25 NOTE — Progress Notes (Signed)
No ICM remote transmission received for 09/23/2021 and next ICM transmission scheduled for 10/07/2021.

## 2021-09-26 ENCOUNTER — Telehealth: Payer: Self-pay | Admitting: Internal Medicine

## 2021-09-26 NOTE — Telephone Encounter (Signed)
I am not aware of any concerns with the patient flying post gen change, but will forward to [place] [facility] team to confirm.

## 2021-09-26 NOTE — Telephone Encounter (Signed)
New message   Pt wants to know if she can fly after having her gen change. She said they are going to [place].

## 2021-09-26 NOTE — Telephone Encounter (Signed)
Successful telephone encounter to patient discuss appointments per her request. Ms. Perfecto states she was told to call and schedule wound check and 91 day follow up with Dr. Caryl Comes. Advised patient, after confirming [place] office schedules their own appointments, she would need to contact the [place] office to make both appointments. Wound check will need to be scheduled in  clinic.

## 2021-09-26 NOTE — Telephone Encounter (Signed)
Pt is requesting call back from device clinic to discuss an appt.  Please call back between 11:30 a.m. to 12:30 a.m.

## 2021-09-27 ENCOUNTER — Other Ambulatory Visit
Admission: RE | Admit: 2021-09-27 | Discharge: 2021-09-27 | Disposition: A | Discharge status: Home / Self Care | Payer: BC Managed Care – PPO | Source: Ambulatory Visit | Attending: Internal Medicine | Admitting: Internal Medicine

## 2021-09-27 DIAGNOSIS — Z01812 Encounter for preprocedural laboratory examination: Secondary | ICD-10-CM | POA: Diagnosis present

## 2021-09-27 DIAGNOSIS — I447 Left bundle-branch block, unspecified: Secondary | ICD-10-CM | POA: Insufficient documentation

## 2021-09-27 DIAGNOSIS — I5022 Chronic systolic (congestive) heart failure: Secondary | ICD-10-CM | POA: Insufficient documentation

## 2021-09-27 DIAGNOSIS — I420 Dilated cardiomyopathy: Secondary | ICD-10-CM | POA: Diagnosis present

## 2021-09-27 LAB — CBC
HCT: 44.3 % (ref 36.0–46.0)
Hemoglobin: 14.2 g/dL (ref 12.0–15.0)
MCH: 30.7 pg (ref 26.0–34.0)
MCHC: 32.1 g/dL (ref 30.0–36.0)
MCV: 95.7 fL (ref 80.0–100.0)
Platelets: 211 10*3/uL (ref 150–400)
RBC: 4.63 MIL/uL (ref 3.87–5.11)
RDW: 13.2 % (ref 11.5–15.5)
WBC: 4.5 10*3/uL (ref 4.0–10.5)
nRBC: 0 % (ref 0.0–0.2)

## 2021-09-27 LAB — BASIC METABOLIC PANEL
Anion gap: 6 (ref 5–15)
BUN: 27 mg/dL — ABNORMAL HIGH (ref 6–20)
CO2: 28 mmol/L (ref 22–32)
Calcium: 9.3 mg/dL (ref 8.9–10.3)
Chloride: 106 mmol/L (ref 98–111)
Creatinine, Ser: 1.45 mg/dL — ABNORMAL HIGH (ref 0.44–1.00)
GFR, Estimated: 41 mL/min — ABNORMAL LOW (ref >60)
Glucose, Bld: 96 mg/dL (ref 70–99)
Potassium: 4.2 mmol/L (ref 3.5–5.1)
Sodium: 140 mmol/L (ref 135–145)

## 2021-09-27 NOTE — Telephone Encounter (Signed)
Per Device Clinic: No restrictions with flying. She should know to tell [employer]SA about her device as this is a gen change and hopefully has had all education previously.    Spoke w/ pt.  Advised her of Portia's recommendation.  She reports that she is going to the beach before her change out and going to [facility] after and she is appreciative of the call.   Of note, she apologizes for being difficult in the office yesterday, but this trip is important to her.   She reports that she is frustrated w/ her wt fluctuating b/t 155-160.  Had lengthy discussion w/ her about sodium and fluid intake, compression hose and keeping her feet elevated.  She verbalizes understanding and is appreciative of the call.  Asked her to call back w/ any further questions or concerns.

## 2021-09-30 ENCOUNTER — Telehealth: Payer: Self-pay | Admitting: Internal Medicine

## 2021-09-30 ENCOUNTER — Telehealth: Payer: Self-pay | Admitting: Cardiovascular Disease

## 2021-09-30 ENCOUNTER — Ambulatory Visit
Admission: EM | Admit: 2021-09-30 | Discharge: 2021-09-30 | Disposition: A | Discharge status: Home / Self Care | Payer: BC Managed Care – PPO | Attending: Nurse Practitioner | Admitting: Nurse Practitioner

## 2021-09-30 ENCOUNTER — Ambulatory Visit (INDEPENDENT_AMBULATORY_CARE_PROVIDER_SITE_OTHER): Payer: BC Managed Care – PPO

## 2021-09-30 DIAGNOSIS — R059 Cough, unspecified: Secondary | ICD-10-CM | POA: Insufficient documentation

## 2021-09-30 DIAGNOSIS — R0789 Other chest pain: Secondary | ICD-10-CM | POA: Insufficient documentation

## 2021-09-30 DIAGNOSIS — I420 Dilated cardiomyopathy: Secondary | ICD-10-CM | POA: Diagnosis not present

## 2021-09-30 DIAGNOSIS — I110 Hypertensive heart disease with heart failure: Secondary | ICD-10-CM | POA: Diagnosis not present

## 2021-09-30 DIAGNOSIS — J4520 Mild intermittent asthma, uncomplicated: Secondary | ICD-10-CM | POA: Diagnosis present

## 2021-09-30 DIAGNOSIS — J45909 Unspecified asthma, uncomplicated: Secondary | ICD-10-CM | POA: Insufficient documentation

## 2021-09-30 DIAGNOSIS — Z20822 Contact with and (suspected) exposure to covid-19: Secondary | ICD-10-CM | POA: Diagnosis not present

## 2021-09-30 DIAGNOSIS — Z95810 Presence of automatic (implantable) cardiac defibrillator: Secondary | ICD-10-CM | POA: Insufficient documentation

## 2021-09-30 DIAGNOSIS — I5022 Chronic systolic (congestive) heart failure: Secondary | ICD-10-CM | POA: Insufficient documentation

## 2021-09-30 LAB — COMPREHENSIVE METABOLIC PANEL
ALT: 18 U/L (ref 0–44)
AST: 27 U/L (ref 15–41)
Albumin: 4.2 g/dL (ref 3.5–5.0)
Alkaline Phosphatase: 93 U/L (ref 38–126)
Anion gap: 5 (ref 5–15)
BUN: 28 mg/dL — ABNORMAL HIGH (ref 6–20)
CO2: 26 mmol/L (ref 22–32)
Calcium: 9.2 mg/dL (ref 8.9–10.3)
Chloride: 104 mmol/L (ref 98–111)
Creatinine, Ser: 1.32 mg/dL — ABNORMAL HIGH (ref 0.44–1.00)
GFR, Estimated: 46 mL/min — ABNORMAL LOW (ref >60)
Glucose, Bld: 81 mg/dL (ref 70–99)
Potassium: 3.8 mmol/L (ref 3.5–5.1)
Sodium: 135 mmol/L (ref 135–145)
Total Bilirubin: 0.8 mg/dL (ref 0.3–1.2)
Total Protein: 7.2 g/dL (ref 6.5–8.1)

## 2021-09-30 LAB — CBC WITH DIFFERENTIAL/PLATELET
Abs Immature Granulocytes: 0.01 10*3/uL (ref 0.00–0.07)
Basophils Absolute: 0.1 10*3/uL (ref 0.0–0.1)
Basophils Relative: 1 %
Eosinophils Absolute: 0.1 10*3/uL (ref 0.0–0.5)
Eosinophils Relative: 1 %
HCT: 44.9 % (ref 36.0–46.0)
Hemoglobin: 14.8 g/dL (ref 12.0–15.0)
Immature Granulocytes: 0 %
Lymphocytes Relative: 37 %
Lymphs Abs: 1.9 10*3/uL (ref 0.7–4.0)
MCH: 31.1 pg (ref 26.0–34.0)
MCHC: 33 g/dL (ref 30.0–36.0)
MCV: 94.3 fL (ref 80.0–100.0)
Monocytes Absolute: 0.5 10*3/uL (ref 0.1–1.0)
Monocytes Relative: 10 %
Neutro Abs: 2.6 10*3/uL (ref 1.7–7.7)
Neutrophils Relative %: 51 %
Platelets: 211 10*3/uL (ref 150–400)
RBC: 4.76 MIL/uL (ref 3.87–5.11)
RDW: 13.2 % (ref 11.5–15.5)
WBC: 5.1 10*3/uL (ref 4.0–10.5)
nRBC: 0 % (ref 0.0–0.2)

## 2021-09-30 LAB — SARS CORONAVIRUS 2 BY RT PCR: SARS Coronavirus 2 by RT PCR: NEGATIVE

## 2021-09-30 MED ORDER — ALBUTEROL SULFATE (2.5 MG/3ML) 0.083% IN NEBU: 2.5000 mg | INHALATION_SOLUTION | Freq: Four times a day (QID) | RESPIRATORY_TRACT | 12 refills | Status: DC | PRN

## 2021-09-30 NOTE — Telephone Encounter (Signed)
Spoke to patient. She stated that that covid test was negative.   Dr. Patsey Berthold, please advise. Dr. Mortimer Fries is unavailable.
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
## 2021-09-30 NOTE — Telephone Encounter (Signed)
Pt c/o of Chest Pain: STAT if CP now or developed within 24 hours  1. Are you having CP right now? Still some chest heaviness  2. Are you experiencing any other symptoms (ex. SOB, nausea, vomiting, sweating)? no  3. How long have you been experiencing CP? Started last night  4. Is your CP continuous or coming and going? continuous  5. Have you taken Nitroglycerin? no ?   Did not sleep well last night, did relieve some of the heaviness after taking her albuterol but the heaviness is still somewhat continuous per patient.   Spoke to [employer], RN in triage and she was on the phone with a patient. Will send high priority to triage.

## 2021-09-30 NOTE — Telephone Encounter (Signed)
Patient is aware of below message and voiced her understanding.  She will go to [facility] tomorrow.  Nothing further needed.

## 2021-09-30 NOTE — Telephone Encounter (Signed)
Spoke to patient and relayed below message/recommendations.  Patient stated that she is not willing to go to ED but she may consider UC.  Nothing further needed.   Routing to Dr. Patsey Berthold as an [place]

## 2021-09-30 NOTE — Telephone Encounter (Signed)
Spoke to patient.  C/o chest tightness/ heaviness and dry cough. Sx started last night. Denied f/c/s or additional sx.  Used albuterol HFA once with some relief in sx. No maintenance inhaler or albuterol solution. She will take home covid test and call back with results.

## 2021-09-30 NOTE — Telephone Encounter (Signed)
UC still reasonable as they can perform imaging.
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
## 2021-09-30 NOTE — Telephone Encounter (Signed)
She has a significant cardiac history and it would be better if she gets evaluated in the ED.  We will be difficult to give her medications over the phone without making sure that there is not something going on with her heart.

## 2021-09-30 NOTE — Discharge Instructions (Addendum)
Your COVID, Influenza are all negative. Chest x-ray negative We encourage conservative treatment with symptom relief. We encourage you to use Tylenol alternating with Ibuprofen for your fever if not contraindicated. (Remember to use as directed do not exceed daily dosing recommendations) Your cough can be soothed with a cough suppressant. We have prescribed you a cough suppressant to be taken as  directed. Patient agreed with above plan and verbalized understanding

## 2021-09-30 NOTE — ED Triage Notes (Signed)
Pt here with C/O cough for couple weeks and chest tightness started yesterday, Has used inhaler with relief.

## 2021-10-01 NOTE — Telephone Encounter (Signed)
Scheduled

## 2021-10-10 NOTE — Progress Notes (Signed)
No ICM remote transmission received for 10/07/2021 and next ICM transmission scheduled for 10/18/2021.

## 2021-10-17 ENCOUNTER — Ambulatory Visit: Payer: BC Managed Care – PPO

## 2021-10-17 DIAGNOSIS — I429 Cardiomyopathy, unspecified: Secondary | ICD-10-CM

## 2021-10-17 LAB — CUP PACEART REMOTE DEVICE CHECK
Battery Remaining Longevity: 1 mo
Battery Voltage: 2.68 V
Brady Statistic AP VP Percent: 2.49 %
Brady Statistic AP VS Percent: 0.1 %
Brady Statistic AS VP Percent: 96 %
Brady Statistic AS VS Percent: 1.4 %
Brady Statistic RA Percent Paced: 2.6 %
Brady Statistic RV Percent Paced: 5.99 %
Date Time Interrogation Session: 20230824033523
HighPow Impedance: 91 Ohm
Implantable Lead Implant Date: 20160421
Implantable Lead Implant Date: 20160421
Implantable Lead Implant Date: 20161006
Implantable Lead Implant Date: 20161006
Implantable Lead Location: 753858
Implantable Lead Location: 753858
Implantable Lead Location: 753859
Implantable Lead Location: 753860
Implantable Lead Model: 5071
Implantable Lead Model: 5071
Implantable Lead Model: 5076
Implantable Pulse Generator Implant Date: 20160421
Lead Channel Impedance Value: 323 Ohm
Lead Channel Impedance Value: 323 Ohm
Lead Channel Impedance Value: 380 Ohm
Lead Channel Impedance Value: 399 Ohm
Lead Channel Impedance Value: 4047 Ohm
Lead Channel Impedance Value: 4047 Ohm
Lead Channel Pacing Threshold Amplitude: 0.5 V
Lead Channel Pacing Threshold Amplitude: 1.25 V
Lead Channel Pacing Threshold Pulse Width: 0.4 ms
Lead Channel Pacing Threshold Pulse Width: 0.4 ms
Lead Channel Sensing Intrinsic Amplitude: 28.875 mV
Lead Channel Sensing Intrinsic Amplitude: 28.875 mV
Lead Channel Sensing Intrinsic Amplitude: 4 mV
Lead Channel Sensing Intrinsic Amplitude: 4 mV
Lead Channel Setting Pacing Amplitude: 1.5 V
Lead Channel Setting Pacing Amplitude: 2 V
Lead Channel Setting Pacing Amplitude: 2.5 V
Lead Channel Setting Pacing Pulse Width: 0.4 ms
Lead Channel Setting Pacing Pulse Width: 0.4 ms
Lead Channel Setting Sensing Sensitivity: 0.3 mV

## 2021-10-18 ENCOUNTER — Telehealth: Payer: Self-pay

## 2021-10-18 ENCOUNTER — Ambulatory Visit (INDEPENDENT_AMBULATORY_CARE_PROVIDER_SITE_OTHER): Payer: BC Managed Care – PPO

## 2021-10-18 DIAGNOSIS — Z95810 Presence of automatic (implantable) cardiac defibrillator: Secondary | ICD-10-CM

## 2021-10-18 DIAGNOSIS — I5022 Chronic systolic (congestive) heart failure: Secondary | ICD-10-CM

## 2021-10-18 NOTE — Telephone Encounter (Signed)
Remote ICM transmission received.  Attempted call to patient regarding ICM remote transmission and no answer.  

## 2021-10-18 NOTE — Progress Notes (Signed)
EPIC Encounter for ICM Monitoring  Patient Name: Suzanne Spears is a 61 y.o. female Date: 10/18/2021 Primary Care Physican: [name], MD Primary Cardiologist: Rockey Situ Electrophysiologist: Vergie Living Pacing:  98.3%    07/31/2021 Weight: 156 lbs  09/04/2021 Weight: 158 lbs 09/09/2021 Weight: 158 lbs   Battery Longevity: ERI reached and device replacement is scheduled 8/31            Attempted call to patient and unable to reach.   Transmission reviewed.  Battery change scheduled for 8/31.  OptiVol Thoracic impedance normal but was suggesting possible fluid accumulation from 8/1-8/7 and again from 8/11 through 8/18.   Prescribed:  Furosemide 20 mg Take1 tablet (20 mg) once daily. May take extra 20 mg as needed for weight gain or swelling.  Pt reports 7/12 she does not take Furosemide daily but tries to take it every other day if needed. Potassium 10 mEq take 2 tablets daily   Labs:   06/27/2021 Creatinine 1.37, BUN 32, Potassium 4.6, Sodium 139, GFR 44 03/08/2021 Creatinine 1.14, BUN 27, Potassium 4.1, Sodium 140, GFR 55 A complete set of results can be found in Results Review.   Recommendations:  Unable to reach.     Follow-up plan: ICM clinic phone appointment on 12/09/2021 (post battery change).  91 day device clinic remote transmission pending battery change.     EP/Cardiology Office Visits:  Recall 06/24/2022 with Dr Rockey Situ.  01/30/2022 with Dr Caryl Comes.   Copy of ICM check sent to Dr. Caryl Comes.  3 month ICM trend: 10/17/2021.    12-14 Month ICM trend:     [name], RN 10/18/2021 4:27 PM

## 2021-10-20 ENCOUNTER — Encounter: Payer: Self-pay | Admitting: Internal Medicine

## 2021-10-21 ENCOUNTER — Other Ambulatory Visit: Payer: Self-pay | Admitting: Student

## 2021-10-21 ENCOUNTER — Telehealth: Payer: Self-pay | Admitting: Internal Medicine

## 2021-10-21 DIAGNOSIS — G43119 Migraine with aura, intractable, without status migrainosus: Secondary | ICD-10-CM

## 2021-10-21 NOTE — Telephone Encounter (Signed)
I spoke with the patient. She advised that she went to a funeral on Saturday and was exposed to [place]. She has tested negative as of yesterday.  She is scheduled for a generator change on Thursday 8/31 with Dr. Caryl Comes.  I have advised her to home test on Wednesday evening/ Thursday morning prior to her procedure.   The patient voices understanding and is agreeable.

## 2021-10-21 NOTE — Telephone Encounter (Signed)
New Message:      Patient says she have her procedure on Thursday. She was exposed to Covid on Saturday, no symptoms, took the Covid Test yesterday it was negative. She wants to know what to do asap, concerning the test on Thursday?

## 2021-10-23 ENCOUNTER — Other Ambulatory Visit (HOSPITAL_COMMUNITY): Payer: Self-pay | Admitting: Student

## 2021-10-23 DIAGNOSIS — G43119 Migraine with aura, intractable, without status migrainosus: Secondary | ICD-10-CM

## 2021-10-24 ENCOUNTER — Ambulatory Visit
Admission: RE | Admit: 2021-10-24 | Discharge: 2021-10-24 | Disposition: A | Discharge status: Home / Self Care | Payer: BC Managed Care – PPO | Attending: Internal Medicine | Admitting: Internal Medicine

## 2021-10-24 ENCOUNTER — Other Ambulatory Visit: Payer: Self-pay

## 2021-10-24 ENCOUNTER — Other Ambulatory Visit: Payer: Self-pay | Admitting: *Deleted

## 2021-10-24 ENCOUNTER — Encounter: Payer: Self-pay | Admitting: Internal Medicine

## 2021-10-24 ENCOUNTER — Encounter
Admission: RE | Disposition: A | Discharge status: Home / Self Care | Payer: BC Managed Care – PPO | Source: Home / Self Care | Attending: Internal Medicine

## 2021-10-24 ENCOUNTER — Telehealth: Payer: Self-pay | Admitting: Internal Medicine

## 2021-10-24 DIAGNOSIS — I428 Other cardiomyopathies: Secondary | ICD-10-CM | POA: Insufficient documentation

## 2021-10-24 DIAGNOSIS — I110 Hypertensive heart disease with heart failure: Secondary | ICD-10-CM | POA: Diagnosis not present

## 2021-10-24 DIAGNOSIS — I809 Phlebitis and thrombophlebitis of unspecified site: Secondary | ICD-10-CM | POA: Insufficient documentation

## 2021-10-24 DIAGNOSIS — I420 Dilated cardiomyopathy: Secondary | ICD-10-CM

## 2021-10-24 DIAGNOSIS — F419 Anxiety disorder, unspecified: Secondary | ICD-10-CM | POA: Diagnosis not present

## 2021-10-24 DIAGNOSIS — I5022 Chronic systolic (congestive) heart failure: Secondary | ICD-10-CM | POA: Insufficient documentation

## 2021-10-24 DIAGNOSIS — I429 Cardiomyopathy, unspecified: Secondary | ICD-10-CM

## 2021-10-24 DIAGNOSIS — Z01812 Encounter for preprocedural laboratory examination: Secondary | ICD-10-CM

## 2021-10-24 DIAGNOSIS — Z4502 Encounter for adjustment and management of automatic implantable cardiac defibrillator: Secondary | ICD-10-CM | POA: Insufficient documentation

## 2021-10-24 HISTORY — PX: ICD GENERATOR CHANGEOUT: EP1231

## 2021-10-24 SURGERY — ICD GENERATOR CHANGEOUT
Anesthesia: Moderate Sedation

## 2021-10-24 MED ORDER — SODIUM CHLORIDE 0.9 % IV SOLN: INTRAVENOUS | Status: DC

## 2021-10-24 MED ORDER — ACETAMINOPHEN 325 MG PO TABS: 325.0000 mg | ORAL_TABLET | ORAL | Status: DC | PRN

## 2021-10-24 MED ORDER — VANCOMYCIN HCL IN DEXTROSE 1-5 GM/200ML-% IV SOLN
1000.0000 mg | INTRAVENOUS | Status: AC
  Administered 2021-10-24: 1000 mg via INTRAVENOUS
  Filled 2021-10-24: qty 200

## 2021-10-24 MED ORDER — CHLORHEXIDINE GLUCONATE 4 % EX LIQD: 4.0000 | Freq: Once | CUTANEOUS | Status: DC

## 2021-10-24 MED ORDER — FENTANYL CITRATE (PF) 100 MCG/2ML IJ SOLN
INTRAMUSCULAR | Status: DC | PRN
  Administered 2021-10-24: 25 ug via INTRAVENOUS
  Administered 2021-10-24: 50 ug via INTRAVENOUS

## 2021-10-24 MED ORDER — CHLORHEXIDINE GLUCONATE CLOTH 2 % EX PADS
6.0000 | MEDICATED_PAD | Freq: Every day | CUTANEOUS | Status: DC
  Administered 2021-10-24: 6 via TOPICAL

## 2021-10-24 MED ORDER — MIDAZOLAM HCL 2 MG/2ML IJ SOLN
INTRAMUSCULAR | Status: AC
  Filled 2021-10-24: qty 2

## 2021-10-24 MED ORDER — LIDOCAINE HCL (PF) 1 % IJ SOLN
INTRAMUSCULAR | Status: DC | PRN
  Administered 2021-10-24: 40 mL

## 2021-10-24 MED ORDER — FENTANYL CITRATE (PF) 100 MCG/2ML IJ SOLN
INTRAMUSCULAR | Status: AC
  Filled 2021-10-24: qty 2

## 2021-10-24 MED ORDER — MIDAZOLAM HCL 2 MG/2ML IJ SOLN
INTRAMUSCULAR | Status: DC | PRN
  Administered 2021-10-24 (×2): 2 mg via INTRAVENOUS

## 2021-10-24 MED ORDER — SPIRONOLACTONE 25 MG PO TABS: 25.0000 mg | ORAL_TABLET | Freq: Every day | ORAL | 1 refills | Status: DC

## 2021-10-24 MED ORDER — SODIUM CHLORIDE 0.9 % IV SOLN
80.0000 mg | INTRAVENOUS | Status: AC
  Administered 2021-10-24: 80 mg
  Filled 2021-10-24: qty 2

## 2021-10-24 MED ORDER — LIDOCAINE HCL 1 % IJ SOLN
INTRAMUSCULAR | Status: AC
  Filled 2021-10-24: qty 40

## 2021-10-24 MED ORDER — SPIRONOLACTONE 25 MG PO TABS
25.0000 mg | ORAL_TABLET | Freq: Every day | ORAL | 0 refills | Status: DC
Start: 2021-10-24 — End: 2021-10-24

## 2021-10-24 SURGICAL SUPPLY — 14 items
CABLE SURG 12 DISP A/V CHANNEL (MISCELLANEOUS) IMPLANT
COVER SURGICAL LIGHT HANDLE (MISCELLANEOUS) IMPLANT
DEVICE DSSCT PLSMBLD 3.0S LGHT (MISCELLANEOUS) IMPLANT
ICD CLARIA MRI DTMA1D4 (ICD Generator) IMPLANT
PAD ELECT DEFIB RADIOL ZOLL (MISCELLANEOUS) IMPLANT
PLASMABLADE 3.0S W/LIGHT (MISCELLANEOUS) ×1
POUCH AIGIS-R ANTIBACT ICD (Mesh General) ×1 IMPLANT
POUCH AIGIS-R ANTIBACT ICD LRG (Mesh General) IMPLANT
SLING ARM IMMOBILIZER MED (SOFTGOODS) IMPLANT
SPONGE XRAY 4X4 16PLY STRL (MISCELLANEOUS) IMPLANT
SUT VIC AB 2-0 CT1 27 (SUTURE) ×1
SUT VIC AB 2-0 CT1 TAPERPNT 27 (SUTURE) IMPLANT
SUT VIC AB 4-0 PS2 18 (SUTURE) IMPLANT
TRAY PACEMAKER INSERTION (PACKS) ×1 IMPLANT

## 2021-10-24 NOTE — Telephone Encounter (Signed)
Patient currently admitted today for an ICD generator changeout.  Secure chat received from Dr. Caryl Comes stating: can we resume spiro 25 daily-- needs a Rx     RX for spironolactone 25 mg once daily sent to the pharmacy on file.
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
## 2021-10-24 NOTE — Progress Notes (Signed)
Patient pre procedure completed at 0728.  Dr Caryl Comes requested a 12 lead ekg that was not originally ordered.  Delay in procedure start time due to added ekg after pre procedure completed.

## 2021-10-25 ENCOUNTER — Encounter: Payer: Self-pay | Admitting: Internal Medicine

## 2021-10-29 ENCOUNTER — Encounter: Payer: Self-pay | Admitting: Internal Medicine

## 2021-11-01 ENCOUNTER — Telehealth: Payer: Self-pay

## 2021-11-01 NOTE — Telephone Encounter (Signed)
I spoke with the patient and helped her send a manual transmission. Monitor is working properly.

## 2021-11-06 ENCOUNTER — Ambulatory Visit: Payer: BC Managed Care – PPO | Attending: Internal Medicine

## 2021-11-12 NOTE — Progress Notes (Signed)
Remote ICD transmission.   

## 2021-11-13 ENCOUNTER — Ambulatory Visit: Payer: BC Managed Care – PPO

## 2021-11-13 ENCOUNTER — Encounter: Payer: Self-pay | Admitting: Internal Medicine

## 2021-11-13 NOTE — Telephone Encounter (Signed)
Have reached out to [facility]-- she has a CRT system with two epicardial LV leads, one in use and the other not (capped) Thanks SK

## 2021-11-14 ENCOUNTER — Telehealth: Payer: BC Managed Care – PPO | Admitting: Physician Assistant

## 2021-11-14 ENCOUNTER — Encounter: Payer: Self-pay | Admitting: Physician Assistant

## 2021-11-14 ENCOUNTER — Encounter: Payer: Self-pay | Admitting: Internal Medicine

## 2021-11-14 DIAGNOSIS — Z20822 Contact with and (suspected) exposure to covid-19: Secondary | ICD-10-CM

## 2021-11-14 NOTE — Patient Instructions (Signed)
Suzanne Spears, thank you for joining [name], PA-C for today's virtual visit.  While this provider is not your primary care provider (PCP), if your PCP is located in our provider database this encounter information will be shared with them immediately following your visit.  Consent: (Patient) Suzanne Spears provided verbal consent for this virtual visit at the beginning of the encounter.  Current Medications:  Current Outpatient Medications:    AIMOVIG 140 MG/ML SOAJ, , Disp: , Rfl:    baclofen (LIORESAL) 10 MG tablet, Take 1 tablet (10 mg total) by mouth 2 (two) times daily., Disp: 60 each, Rfl: 1   cetirizine (ZYRTEC) 10 MG chewable tablet, Chew 10 mg by mouth daily. (Patient not taking: Reported on 10/24/2021), Disp: , Rfl:    cyanocobalamin (,VITAMIN B-12,) 1000 MCG/ML injection, INJECT 1 ML INTO THE MUSCLE ONCE A WEEK, Disp: 4 mL, Rfl: 1   dapagliflozin propanediol (FARXIGA) 10 MG TABS tablet, Take 1 tablet (10 mg total) by mouth daily., Disp: 90 tablet, Rfl: 3   DULoxetine (CYMBALTA) 60 MG capsule, TAKE ONE CAPSULE BY MOUTH ONCE DAILY (Patient not taking: Reported on 10/24/2021), Disp: 30 capsule, Rfl: 4   Elderberry 500 MG CAPS, Take 500 mg by mouth daily as needed., Disp: , Rfl:    ENTRESTO 24-26 MG, Take 0.5 tablets by mouth 2 (two) times daily., Disp: 90 tablet, Rfl: 3   etodolac (LODINE) 500 MG tablet, Take 500 mg by mouth 2 (two) times daily. (Patient not taking: Reported on 10/24/2021), Disp: , Rfl:    ezetimibe (ZETIA) 10 MG tablet, Take 1 tablet (10 mg total) by mouth daily., Disp: 90 tablet, Rfl: 3   fluticasone (FLONASE) 50 MCG/ACT nasal spray, USE 2 SPRAYS INTO BOTH NOSTRILS ONCE DAILY AS DIRECTED BY PHYSICIAN., Disp: 48 g, Rfl: 2   furosemide (LASIX) 20 MG tablet, Take 1 tablet (20 mg total) by mouth daily. Take extra 20 mg as needed., Disp: 135 tablet, Rfl: 3   gabapentin (NEURONTIN) 100 MG capsule, TAKE ONE CAPSULE BY MOUTH THREE TIMES DAILY, Disp: 180  capsule, Rfl: 0   Multiple Minerals-Vitamins (YUMVS CALC-MAG-ZINC-VIT D PO), Take by mouth., Disp: , Rfl:    potassium chloride (KLOR-CON) 10 MEQ tablet, Take 2 tablets (20 mEq total) by mouth daily., Disp: 180 tablet, Rfl: 3   rosuvastatin (CRESTOR) 5 MG tablet, Take 1 tablet (5 mg total) by mouth daily., Disp: 90 tablet, Rfl: 3   spironolactone (ALDACTONE) 25 MG tablet, Take 1 tablet (25 mg total) by mouth daily., Disp: 90 tablet, Rfl: 1   Syringe/Needle, Disp, (SYRINGE 3CC/25GX1") 25G X 1" 3 ML MISC, Use to inject 1 mL of vitamin B12 intramuscular every 30 days., Disp: 50 each, Rfl: 0   traMADol (ULTRAM) 50 MG tablet, Take 50 mg by mouth every 6 (six) hours as needed., Disp: , Rfl:    traZODone (DESYREL) 50 MG tablet, TAKE 1/2 TO 1 TABLET BY MOUTH AT BEDTIMEAS NEEDED FOR SLEEP, Disp: 90 tablet, Rfl: 1   valACYclovir (VALTREX) 1000 MG tablet, Take 1 tablet (1,000 mg total) by mouth 2 (two) times daily as needed (fever blisters)., Disp: 20 tablet, Rfl: 3   venlafaxine XR (EFFEXOR-XR) 150 MG 24 hr capsule, TAKE ONE CAPSULE BY MOUTH ONCE DAILY WITH BREAKFAST, Disp: 90 capsule, Rfl: 1   Medications ordered in this encounter:  No orders of the defined types were placed in this encounter.    *If you need refills on other medications prior to your next appointment,  please contact your pharmacy*  Follow-Up: Call back or seek an in-person evaluation if the symptoms worsen or if the condition fails to improve as anticipated.  Other Instructions Message me directly once you have taken your repeat test tomorrow and gotten results.  Please keep well-hydrated and get plenty of rest. Start a saline nasal rinse to flush out your nasal passages. You can use plain Mucinex to help thin congestion. If you have a humidifier, running in the bedroom at night. I want you to start OTC vitamin D3 1000 units daily, vitamin C 1000 mg daily, and a zinc supplement. Please take prescribed medications as  directed.  You have been enrolled in a MyChart symptom monitoring program. Please answer these questions daily so we can keep track of how you are doing.  You were to quarantine for 5 days from onset of your symptoms.  After day 5, if you have had no fever and you are feeling better, you can end quarantine but need to mask for an additional 5 days. After day 5 if you have a fever or are having significant symptoms, please quarantine for full 10 days.  If you note any worsening of symptoms, any significant shortness of breath or any chest pain, please seek ER evaluation ASAP.  Please do not delay care!  COVID-19: What to Do if You Are Sick If you test positive and are an older adult or someone who is at high risk of getting very sick from COVID-19, treatment may be available. Contact a healthcare provider right away after a positive test to determine if you are eligible, even if your symptoms are mild right now. You can also visit a Test to Treat location and, if eligible, receive a prescription from a provider. Don't delay: Treatment must be started within the first few days to be effective. If you have a fever, cough, or other symptoms, you might have COVID-19. Most people have mild illness and are able to recover at home. If you are sick: Keep track of your symptoms. If you have an emergency warning sign (including trouble breathing), call 911. Steps to help prevent the spread of COVID-19 if you are sick If you are sick with COVID-19 or think you might have COVID-19, follow the steps below to care for yourself and to help protect other people in your home and community. Stay home except to get medical care Stay home. Most people with COVID-19 have mild illness and can recover at home without medical care. Do not leave your home, except to get medical care. Do not visit public areas and do not go to places where you are unable to wear a mask. Take care of yourself. Get rest and stay hydrated.  Take over-the-counter medicines, such as acetaminophen, to help you feel better. Stay in touch with your doctor. Call before you get medical care. Be sure to get care if you have trouble breathing, or have any other emergency warning signs, or if you think it is an emergency. Avoid public transportation, ride-sharing, or taxis if possible. Get tested If you have symptoms of COVID-19, get tested. While waiting for test results, stay away from others, including staying apart from those living in your household. Get tested as soon as possible after your symptoms start. Treatments may be available for people with COVID-19 who are at risk for becoming very sick. Don't delay: Treatment must be started early to be effective--some treatments must begin within 5 days of your first symptoms. Contact your healthcare  provider right away if your test result is positive to determine if you are eligible. Self-tests are one of several options for testing for the virus that causes COVID-19 and may be more convenient than laboratory-based tests and point-of-care tests. Ask your healthcare provider or your local health department if you need help interpreting your test results. You can visit your state, tribal, local, and territorial health department's website to look for the latest local information on testing sites. Separate yourself from other people As much as possible, stay in a specific room and away from other people and pets in your home. If possible, you should use a separate bathroom. If you need to be around other people or animals in or outside of the home, wear a well-fitting mask. Tell your close contacts that they may have been exposed to COVID-19. An infected person can spread COVID-19 starting 48 hours (or 2 days) before the person has any symptoms or tests positive. By letting your close contacts know they may have been exposed to COVID-19, you are helping to protect everyone. See COVID-19 and Animals if  you have questions about pets. If you are diagnosed with COVID-19, someone from the health department may call you. Answer the call to slow the spread. Monitor your symptoms Symptoms of COVID-19 include fever, cough, or other symptoms. Follow care instructions from your healthcare provider and local health department. Your local health authorities may give instructions on checking your symptoms and reporting information. When to seek emergency medical attention Look for emergency warning signs* for COVID-19. If someone is showing any of these signs, seek emergency medical care immediately: Trouble breathing Persistent pain or pressure in the chest New confusion Inability to wake or stay awake Pale, gray, or blue-colored skin, lips, or nail beds, depending on skin tone *This list is not all possible symptoms. Please call your medical provider for any other symptoms that are severe or concerning to you. Call 911 or call ahead to your local emergency facility: Notify the operator that you are seeking care for someone who has or may have COVID-19. Call ahead before visiting your doctor Call ahead. Many medical visits for routine care are being postponed or done by phone or telemedicine. If you have a medical appointment that cannot be postponed, call your doctor's office, and tell them you have or may have COVID-19. This will help the office protect themselves and other patients. If you are sick, wear a well-fitting mask You should wear a mask if you must be around other people or animals, including pets (even at home). Wear a mask with the best fit, protection, and comfort for you. You don't need to wear the mask if you are alone. If you can't put on a mask (because of trouble breathing, for example), cover your coughs and sneezes in some other way. Try to stay at least 6 feet away from other people. This will help protect the people around you. Masks should not be placed on young children under  age 46 years, anyone who has trouble breathing, or anyone who is not able to remove the mask without help. Cover your coughs and sneezes Cover your mouth and nose with a tissue when you cough or sneeze. Throw away used tissues in a lined trash can. Immediately wash your hands with soap and water for at least 20 seconds. If soap and water are not available, clean your hands with an alcohol-based hand sanitizer that contains at least 60% alcohol. Clean your hands often Wash  your hands often with soap and water for at least 20 seconds. This is especially important after blowing your nose, coughing, or sneezing; going to the bathroom; and before eating or preparing food. Use hand sanitizer if soap and water are not available. Use an alcohol-based hand sanitizer with at least 60% alcohol, covering all surfaces of your hands and rubbing them together until they feel dry. Soap and water are the best option, especially if hands are visibly dirty. Avoid touching your eyes, nose, and mouth with unwashed hands. Handwashing Tips Avoid sharing personal household items Do not share dishes, drinking glasses, cups, eating utensils, towels, or bedding with other people in your home. Wash these items thoroughly after using them with soap and water or put in the dishwasher. Clean surfaces in your home regularly Clean and disinfect high-touch surfaces (for example, doorknobs, tables, handles, light switches, and countertops) in your "sick room" and bathroom. In shared spaces, you should clean and disinfect surfaces and items after each use by the person who is ill. If you are sick and cannot clean, a caregiver or other person should only clean and disinfect the area around you (such as your bedroom and bathroom) on an as needed basis. Your caregiver/other person should wait as long as possible (at least several hours) and wear a mask before entering, cleaning, and disinfecting shared spaces that you use. Clean and  disinfect areas that may have blood, stool, or body fluids on them. Use household cleaners and disinfectants. Clean visible dirty surfaces with household cleaners containing soap or detergent. Then, use a household disinfectant. Use a product from [employer] List N: Disinfectants for Coronavirus (VVOHY-07). Be sure to follow the instructions on the label to ensure safe and effective use of the product. Many products recommend keeping the surface wet with a disinfectant for a certain period of time (look at "contact time" on the product label). You may also need to wear personal protective equipment, such as gloves, depending on the directions on the product label. Immediately after disinfecting, wash your hands with soap and water for 20 seconds. For completed guidance on cleaning and disinfecting your home, visit Complete Disinfection Guidance. Take steps to improve ventilation at home Improve ventilation (air flow) at home to help prevent from spreading COVID-19 to other people in your household. Clear out COVID-19 virus particles in the air by opening windows, using air filters, and turning on fans in your home. Use this interactive tool to learn how to improve air flow in your home. When you can be around others after being sick with COVID-19 Deciding when you can be around others is different for different situations. Find out when you can safely end home isolation. For any additional questions about your care, contact your healthcare provider or state or local health department. 05/15/2020 Content source: [facility] for Immunization and Respiratory Diseases ([facility]), Division of Viral Diseases This information is not intended to replace advice given to you by your health care provider. Make sure you discuss any questions you have with your health care provider. Document Revised: 06/28/2020 Document Reviewed: 06/28/2020 Elsevier Patient Education  2022 [employer].      If you have been  instructed to have an in-person evaluation today at a local Urgent Care facility, please use the link below. It will take you to a list of all of our available [facility], including address, phone number and hours of operation. Please do not delay care.  [facility]  If you or a family member do not have a primary care provider, use the link below to schedule a visit and establish care. When you choose a [facility] primary care physician or advanced practice provider, you gain a long-term partner in health. Find a Primary Care Provider  Learn more about [facility]'s in-office and virtual care options: [facility]

## 2021-11-14 NOTE — Progress Notes (Signed)
Virtual Visit Consent   Suzanne Spears, you are scheduled for a virtual visit with a [facility] provider today. Just as with appointments in the office, your consent must be obtained to participate. Your consent will be active for this visit and any virtual visit you may have with one of our providers in the next 365 days. If you have a MyChart account, a copy of this consent can be sent to you electronically.  As this is a virtual visit, video technology does not allow for your provider to perform a traditional examination. This may limit your provider's ability to fully assess your condition. If your provider identifies any concerns that need to be evaluated in person or the need to arrange testing (such as labs, EKG, etc.), we will make arrangements to do so. Although advances in technology are sophisticated, we cannot ensure that it will always work on either your end or our end. If the connection with a video visit is poor, the visit may have to be switched to a telephone visit. With either a video or telephone visit, we are not always able to ensure that we have a secure connection.  By engaging in this virtual visit, you consent to the provision of healthcare and authorize for your insurance to be billed (if applicable) for the services provided during this visit. Depending on your insurance coverage, you may receive a charge related to this service.  I need to obtain your verbal consent now. Are you willing to proceed with your visit today? Suzanne Spears has provided verbal consent on 11/14/2021 for a virtual visit (video or telephone). Suzanne Spears, [place]  Date: 11/14/2021 10:07 AM  Virtual Visit via Video Note   I, Suzanne Spears, connected with  Suzanne Spears  ([id number], 06-09-1960, 61) on 11/14/21 at  9:30 AM EDT by a video-enabled telemedicine application and verified that I am speaking with the correct person using two identifiers.  Location: Patient: Virtual Visit  Location Patient: Home Provider: Virtual Visit Location Provider: Home Office   I discussed the limitations of evaluation and management by telemedicine and the availability of in person appointments. The patient expressed understanding and agreed to proceed.    History of Present Illness: Suzanne Spears is a 61 y.o. who identifies as a female who was assigned female at birth, and is being seen today for poossible COVID-19. Notes symptoms starting yesterday with aches, fatigue, head congestion. Overnight developing chills, fever, cough that is dry but persistent. DAughter tested positive for COVID yesterday. Was just around her last 4 days ago. Took home COVID test last night that was negative.  HPI: HPI  Problems:  Patient Active Problem List   Diagnosis Date Noted   Colon cancer screening    Polyp of ascending colon    Abdominal bloating with cramps 05/01/2020   Cervical radiculopathy 03/07/2020   Myalgia due to statin 11/12/2019   Encounter for preventive health examination 11/12/2019   History of 2019 novel coronavirus disease (COVID-19) 11/10/2019   Breast calcifications on mammogram 06/21/2019   Chronic kidney disease, stage II (mild) 10/31/2018   Heel pain, bilateral 03/23/2018   Concussion with no loss of consciousness, subsequent encounter 03/23/2018   Polyarthritis of multiple sites 05/13/2017   GERD (gastroesophageal reflux disease) 03/31/2017   Habitual snoring 01/31/2017   Ocular migraine 12/02/2016   Chronic diastolic CHF (congestive heart failure) ([place]) 08/24/2016   Carpal tunnel syndrome 02/07/2016   Encounter for therapeutic drug monitoring 12/07/2014  Status post thoracotomy 11/30/2014   S/P ICD (internal cardiac defibrillator) procedure 67/89/3810   Chronic systolic heart failure ([place]) 04/27/2014   Depression with anxiety 03/29/2014   Insomnia 03/29/2014   Amaurosis fugax 12/28/2013   Unspecified hereditary and idiopathic peripheral neuropathy 06/14/2013    Edema 06/14/2013   Visit for preventive health examination 05/05/2012   Dyspareunia, female 05/05/2012   B12 deficiency 04/20/2012   Cardiomyopathy, dilated, nonischemic (HCC)    Fatigue 03/11/2011   Asthma, chronic    Left bundle branch block    Mitral valve prolapse syndrome     Allergies:  Allergies  Allergen Reactions   Adhesive [Tape] Rash    Pulls skin off   Carvedilol Swelling and Other (See Comments)    headache swelling   Metoprolol Swelling and Other (See Comments)    Swelling and headache   Penicillin G Swelling    Other reaction(s): Localized superficial swelling of skin   Lisinopril Cough   Other Other (See Comments)    Adhesive pads on heart monitoring devices/ electrodes = skin irritation   Prednisone    Latex Rash   Levofloxacin Rash   Medications:  Current Outpatient Medications:    AIMOVIG 140 MG/ML SOAJ, , Disp: , Rfl:    baclofen (LIORESAL) 10 MG tablet, Take 1 tablet (10 mg total) by mouth 2 (two) times daily., Disp: 60 each, Rfl: 1   cetirizine (ZYRTEC) 10 MG chewable tablet, Chew 10 mg by mouth daily. (Patient not taking: Reported on 10/24/2021), Disp: , Rfl:    cyanocobalamin (,VITAMIN B-12,) 1000 MCG/ML injection, INJECT 1 ML INTO THE MUSCLE ONCE A WEEK, Disp: 4 mL, Rfl: 1   dapagliflozin propanediol (FARXIGA) 10 MG TABS tablet, Take 1 tablet (10 mg total) by mouth daily., Disp: 90 tablet, Rfl: 3   DULoxetine (CYMBALTA) 60 MG capsule, TAKE ONE CAPSULE BY MOUTH ONCE DAILY (Patient not taking: Reported on 10/24/2021), Disp: 30 capsule, Rfl: 4   Elderberry 500 MG CAPS, Take 500 mg by mouth daily as needed., Disp: , Rfl:    ENTRESTO 24-26 MG, Take 0.5 tablets by mouth 2 (two) times daily., Disp: 90 tablet, Rfl: 3   etodolac (LODINE) 500 MG tablet, Take 500 mg by mouth 2 (two) times daily. (Patient not taking: Reported on 10/24/2021), Disp: , Rfl:    ezetimibe (ZETIA) 10 MG tablet, Take 1 tablet (10 mg total) by mouth daily., Disp: 90 tablet, Rfl: 3    fluticasone (FLONASE) 50 MCG/ACT nasal spray, USE 2 SPRAYS INTO BOTH NOSTRILS ONCE DAILY AS DIRECTED BY PHYSICIAN., Disp: 48 g, Rfl: 2   furosemide (LASIX) 20 MG tablet, Take 1 tablet (20 mg total) by mouth daily. Take extra 20 mg as needed., Disp: 135 tablet, Rfl: 3   gabapentin (NEURONTIN) 100 MG capsule, TAKE ONE CAPSULE BY MOUTH THREE TIMES DAILY, Disp: 180 capsule, Rfl: 0   Multiple Minerals-Vitamins (YUMVS CALC-MAG-ZINC-VIT D PO), Take by mouth., Disp: , Rfl:    potassium chloride (KLOR-CON) 10 MEQ tablet, Take 2 tablets (20 mEq total) by mouth daily., Disp: 180 tablet, Rfl: 3   rosuvastatin (CRESTOR) 5 MG tablet, Take 1 tablet (5 mg total) by mouth daily., Disp: 90 tablet, Rfl: 3   spironolactone (ALDACTONE) 25 MG tablet, Take 1 tablet (25 mg total) by mouth daily., Disp: 90 tablet, Rfl: 1   Syringe/Needle, Disp, (SYRINGE 3CC/25GX1") 25G X 1" 3 ML MISC, Use to inject 1 mL of vitamin B12 intramuscular every 30 days., Disp: 50 each, Rfl: 0   traMADol (  ULTRAM) 50 MG tablet, Take 50 mg by mouth every 6 (six) hours as needed., Disp: , Rfl:    traZODone (DESYREL) 50 MG tablet, TAKE 1/2 TO 1 TABLET BY MOUTH AT BEDTIMEAS NEEDED FOR SLEEP, Disp: 90 tablet, Rfl: 1   valACYclovir (VALTREX) 1000 MG tablet, Take 1 tablet (1,000 mg total) by mouth 2 (two) times daily as needed (fever blisters)., Disp: 20 tablet, Rfl: 3   venlafaxine XR (EFFEXOR-XR) 150 MG 24 hr capsule, TAKE ONE CAPSULE BY MOUTH ONCE DAILY WITH BREAKFAST, Disp: 90 capsule, Rfl: 1  Observations/Objective: Patient is well-developed, well-nourished in no acute distress.  Resting comfortably at home.  Head is normocephalic, atraumatic.  No labored breathing. Speech is clear and coherent with logical content.  Patient is alert and oriented at baseline.  Assessment and Plan: 1. Suspected COVID-19 virus infection  Known exposure with close relative. Classic symptoms. Suspect initial false-negative COVID test. Will have her retest in 24  hours and message with results. For now will treat as COVID -- quarantine reviewed. Reviewed supportive measures, OTC medications and Vitamin recommendations. Patient enrolled in [place] monitoring program. Strict ER precautions reviewed. Promethazine-DM per orders.   If test positive, will further discuss antivirals giving her risk factors.   Follow Up Instructions: I discussed the assessment and treatment plan with the patient. The patient was provided an opportunity to ask questions and all were answered. The patient agreed with the plan and demonstrated an understanding of the instructions.  A copy of instructions were sent to the patient via MyChart unless otherwise noted below.   The patient was advised to call back or seek an in-person evaluation if the symptoms worsen or if the condition fails to improve as anticipated.  Time:  I spent 10 minutes with the patient via telehealth technology discussing the above problems/concerns.    Suzanne Rio, PA-C

## 2021-11-15 MED ORDER — MOLNUPIRAVIR EUA 200MG CAPSULE: 4.0000 | ORAL_CAPSULE | Freq: Two times a day (BID) | ORAL | 0 refills | Status: AC

## 2021-11-18 ENCOUNTER — Telehealth: Payer: BC Managed Care – PPO | Admitting: Physician Assistant

## 2021-11-18 DIAGNOSIS — J019 Acute sinusitis, unspecified: Secondary | ICD-10-CM

## 2021-11-18 DIAGNOSIS — U071 COVID-19: Secondary | ICD-10-CM

## 2021-11-18 DIAGNOSIS — B9689 Other specified bacterial agents as the cause of diseases classified elsewhere: Secondary | ICD-10-CM | POA: Diagnosis not present

## 2021-11-18 MED ORDER — DOXYCYCLINE HYCLATE 100 MG PO TABS: 100.0000 mg | ORAL_TABLET | Freq: Two times a day (BID) | ORAL | 0 refills | Status: DC

## 2021-11-18 NOTE — Patient Instructions (Signed)
Suzanne Spears, thank you for joining Mar Daring, PA-C for today's virtual visit.  While this provider is not your primary care provider (PCP), if your PCP is located in our provider database this encounter information will be shared with them immediately following your visit.  Consent: (Patient) Suzanne Spears provided verbal consent for this virtual visit at the beginning of the encounter.  Current Medications:  Current Outpatient Medications:    doxycycline (VIBRA-TABS) 100 MG tablet, Take 1 tablet (100 mg total) by mouth 2 (two) times daily., Disp: 20 tablet, Rfl: 0   AIMOVIG 140 MG/ML SOAJ, , Disp: , Rfl:    baclofen (LIORESAL) 10 MG tablet, Take 1 tablet (10 mg total) by mouth 2 (two) times daily., Disp: 60 each, Rfl: 1   cetirizine (ZYRTEC) 10 MG chewable tablet, Chew 10 mg by mouth daily. (Patient not taking: Reported on 10/24/2021), Disp: , Rfl:    cyanocobalamin (,VITAMIN B-12,) 1000 MCG/ML injection, INJECT 1 ML INTO THE MUSCLE ONCE A WEEK, Disp: 4 mL, Rfl: 1   dapagliflozin propanediol (FARXIGA) 10 MG TABS tablet, Take 1 tablet (10 mg total) by mouth daily., Disp: 90 tablet, Rfl: 3   DULoxetine (CYMBALTA) 60 MG capsule, TAKE ONE CAPSULE BY MOUTH ONCE DAILY (Patient not taking: Reported on 10/24/2021), Disp: 30 capsule, Rfl: 4   Elderberry 500 MG CAPS, Take 500 mg by mouth daily as needed., Disp: , Rfl:    ENTRESTO 24-26 MG, Take 0.5 tablets by mouth 2 (two) times daily., Disp: 90 tablet, Rfl: 3   etodolac (LODINE) 500 MG tablet, Take 500 mg by mouth 2 (two) times daily. (Patient not taking: Reported on 10/24/2021), Disp: , Rfl:    ezetimibe (ZETIA) 10 MG tablet, Take 1 tablet (10 mg total) by mouth daily., Disp: 90 tablet, Rfl: 3   fluticasone (FLONASE) 50 MCG/ACT nasal spray, USE 2 SPRAYS INTO BOTH NOSTRILS ONCE DAILY AS DIRECTED BY PHYSICIAN., Disp: 48 g, Rfl: 2   furosemide (LASIX) 20 MG tablet, Take 1 tablet (20 mg total) by mouth daily. Take extra 20 mg as needed.,  Disp: 135 tablet, Rfl: 3   gabapentin (NEURONTIN) 100 MG capsule, TAKE ONE CAPSULE BY MOUTH THREE TIMES DAILY, Disp: 180 capsule, Rfl: 0   molnupiravir EUA (LAGEVRIO) 200 mg CAPS capsule, Take 4 capsules (800 mg total) by mouth 2 (two) times daily for 5 days., Disp: 40 capsule, Rfl: 0   Multiple Minerals-Vitamins (YUMVS CALC-MAG-ZINC-VIT D PO), Take by mouth., Disp: , Rfl:    potassium chloride (KLOR-CON) 10 MEQ tablet, Take 2 tablets (20 mEq total) by mouth daily., Disp: 180 tablet, Rfl: 3   rosuvastatin (CRESTOR) 5 MG tablet, Take 1 tablet (5 mg total) by mouth daily., Disp: 90 tablet, Rfl: 3   spironolactone (ALDACTONE) 25 MG tablet, Take 1 tablet (25 mg total) by mouth daily., Disp: 90 tablet, Rfl: 1   Syringe/Needle, Disp, (SYRINGE 3CC/25GX1") 25G X 1" 3 ML MISC, Use to inject 1 mL of vitamin B12 intramuscular every 30 days., Disp: 50 each, Rfl: 0   traMADol (ULTRAM) 50 MG tablet, Take 50 mg by mouth every 6 (six) hours as needed., Disp: , Rfl:    traZODone (DESYREL) 50 MG tablet, TAKE 1/2 TO 1 TABLET BY MOUTH AT BEDTIMEAS NEEDED FOR SLEEP, Disp: 90 tablet, Rfl: 1   valACYclovir (VALTREX) 1000 MG tablet, Take 1 tablet (1,000 mg total) by mouth 2 (two) times daily as needed (fever blisters)., Disp: 20 tablet, Rfl: 3   venlafaxine XR (EFFEXOR-XR)  150 MG 24 hr capsule, TAKE ONE CAPSULE BY MOUTH ONCE DAILY WITH BREAKFAST, Disp: 90 capsule, Rfl: 1   Medications ordered in this encounter:  Meds ordered this encounter  Medications   doxycycline (VIBRA-TABS) 100 MG tablet    Sig: Take 1 tablet (100 mg total) by mouth 2 (two) times daily.    Dispense:  20 tablet    Refill:  0    Order Specific Question:   Supervising Provider    Answer:   Chase Picket [id number]     *If you need refills on other medications prior to your next appointment, please contact your pharmacy*  Follow-Up: Call back or seek an in-person evaluation if the symptoms worsen or if the condition fails to improve as  anticipated.  [facility] [phone]  Other Instructions Sinus Infection, Adult A sinus infection, also called sinusitis, is inflammation of your sinuses. Sinuses are hollow spaces in the bones around your face. Your sinuses are located: Around your eyes. In the middle of your forehead. Behind your nose. In your cheekbones. Mucus normally drains out of your sinuses. When your nasal tissues become inflamed or swollen, mucus can become trapped or blocked. This allows bacteria, viruses, and fungi to grow, which leads to infection. Most infections of the sinuses are caused by a virus. A sinus infection can develop quickly. It can last for up to 4 weeks (acute) or for more than 12 weeks (chronic). A sinus infection often develops after a cold. What are the causes? This condition is caused by anything that creates swelling in the sinuses or stops mucus from draining. This includes: Allergies. Asthma. Infection from bacteria or viruses. Deformities or blockages in your nose or sinuses. Abnormal growths in the nose (nasal polyps). Pollutants, such as chemicals or irritants in the air. Infection from fungi. This is rare. What increases the risk? You are more likely to develop this condition if you: Have a weak body defense system (immune system). Do a lot of swimming or diving. Overuse nasal sprays. Smoke. What are the signs or symptoms? The main symptoms of this condition are pain and a feeling of pressure around the affected sinuses. Other symptoms include: Stuffy nose or congestion that makes it difficult to breathe through your nose. Thick yellow or greenish drainage from your nose. Tenderness, swelling, and warmth over the affected sinuses. A cough that may get worse at night. Decreased sense of smell and taste. Extra mucus that collects in the throat or the back of the nose (postnasal drip) causing a sore throat or bad breath. Tiredness (fatigue). Fever. How is  this diagnosed? This condition is diagnosed based on: Your symptoms. Your medical history. A physical exam. Tests to find out if your condition is acute or chronic. This may include: Checking your nose for nasal polyps. Viewing your sinuses using a device that has a light (endoscope). Testing for allergies or bacteria. Imaging tests, such as an MRI or CT scan. In rare cases, a bone biopsy may be done to rule out more serious types of fungal sinus disease. How is this treated? Treatment for a sinus infection depends on the cause and whether your condition is chronic or acute. If caused by a virus, your symptoms should go away on their own within 10 days. You may be given medicines to relieve symptoms. They include: Medicines that shrink swollen nasal passages (decongestants). A spray that eases inflammation of the nostrils (topical intranasal corticosteroids). Rinses that help get rid of thick  mucus in your nose (nasal saline washes). Medicines that treat allergies (antihistamines). Over-the-counter pain relievers. If caused by bacteria, your health care provider may recommend waiting to see if your symptoms improve. Most bacterial infections will get better without antibiotic medicine. You may be given antibiotics if you have: A severe infection. A weak immune system. If caused by narrow nasal passages or nasal polyps, surgery may be needed. Follow these instructions at home: Medicines Take, use, or apply over-the-counter and prescription medicines only as told by your health care provider. These may include nasal sprays. If you were prescribed an antibiotic medicine, take it as told by your health care provider. Do not stop taking the antibiotic even if you start to feel better. Hydrate and humidify  Drink enough fluid to keep your urine pale yellow. Staying hydrated will help to thin your mucus. Use a cool mist humidifier to keep the humidity level in your home above 50%. Inhale  steam for 10-15 minutes, 3-4 times a day, or as told by your health care provider. You can do this in the bathroom while a hot shower is running. Limit your exposure to cool or dry air. Rest Rest as much as possible. Sleep with your head raised (elevated). Make sure you get enough sleep each night. General instructions  Apply a warm, moist washcloth to your face 3-4 times a day or as told by your health care provider. This will help with discomfort. Use nasal saline washes as often as told by your health care provider. Wash your hands often with soap and water to reduce your exposure to germs. If soap and water are not available, use hand sanitizer. Do not smoke. Avoid being around people who are smoking (secondhand smoke). Keep all follow-up visits. This is important. Contact a health care provider if: You have a fever. Your symptoms get worse. Your symptoms do not improve within 10 days. Get help right away if: You have a severe headache. You have persistent vomiting. You have severe pain or swelling around your face or eyes. You have vision problems. You develop confusion. Your neck is stiff. You have trouble breathing. These symptoms may be an emergency. Get help right away. Call 911. Do not wait to see if the symptoms will go away. Do not drive yourself to the hospital. Summary A sinus infection is soreness and inflammation of your sinuses. Sinuses are hollow spaces in the bones around your face. This condition is caused by nasal tissues that become inflamed or swollen. The swelling traps or blocks the flow of mucus. This allows bacteria, viruses, and fungi to grow, which leads to infection. If you were prescribed an antibiotic medicine, take it as told by your health care provider. Do not stop taking the antibiotic even if you start to feel better. Keep all follow-up visits. This is important. This information is not intended to replace advice given to you by your health care  provider. Make sure you discuss any questions you have with your health care provider. Document Revised: 01/15/2021 Document Reviewed: 01/15/2021 Elsevier Patient Education  [place].    If you have been instructed to have an in-person evaluation today at a local Urgent Care facility, please use the link below. It will take you to a list of all of our available [facility], including address, phone number and hours of operation. Please do not delay care.  [facility]  If you or a family member do not have a primary  care provider, use the link below to schedule a visit and establish care. When you choose a [facility] primary care physician or advanced practice provider, you gain a long-term partner in health. Find a Primary Care Provider  Learn more about [facility]'s in-office and virtual care options: [facility]

## 2021-11-18 NOTE — Progress Notes (Signed)
Virtual Visit Consent   Suzanne Spears, you are scheduled for a virtual visit with a [facility] provider today. Just as with appointments in the office, your consent must be obtained to participate. Your consent will be active for this visit and any virtual visit you may have with one of our providers in the next 365 days. If you have a MyChart account, a copy of this consent can be sent to you electronically.  As this is a virtual visit, video technology does not allow for your provider to perform a traditional examination. This may limit your provider's ability to fully assess your condition. If your provider identifies any concerns that need to be evaluated in person or the need to arrange testing (such as labs, EKG, etc.), we will make arrangements to do so. Although advances in technology are sophisticated, we cannot ensure that it will always work on either your end or our end. If the connection with a video visit is poor, the visit may have to be switched to a telephone visit. With either a video or telephone visit, we are not always able to ensure that we have a secure connection.  By engaging in this virtual visit, you consent to the provision of healthcare and authorize for your insurance to be billed (if applicable) for the services provided during this visit. Depending on your insurance coverage, you may receive a charge related to this service.  I need to obtain your verbal consent now. Are you willing to proceed with your visit today? Suzanne Spears has provided verbal consent on 11/18/2021 for a virtual visit (video or telephone). Mar Daring, PA-C  Date: 11/18/2021 1:14 PM  Virtual Visit via Video Note   I, Mar Daring, connected with  Suzanne Spears  ([id number], [birth year]) on 11/18/21 at  1:00 PM EDT by a video-enabled telemedicine application and verified that I am speaking with the correct person using two identifiers.  Location: Patient: Virtual Visit  Location Patient: Home Provider: Virtual Visit Location Provider: Home Office   I discussed the limitations of evaluation and management by telemedicine and the availability of in person appointments. The patient expressed understanding and agreed to proceed.    History of Present Illness: Suzanne Spears is a 61 y.o. who identifies as a female who was assigned female at birth, and is being seen today for possible secondary infection with Covid 20. Symptoms started last Wednesday night with body aches, chills, and fever. She was seen virtually on 11/14/21. Advised to take a Covid 19 test, which she did, and was positive on Friday, 11/15/21. Molnupiravir was prescribed but patient never received medication. Reports overall improvement in some aspects, but has had worsening sinus pain and pressure, discharge from her eyes from the congestion, ear pain/pressure, and now dizziness. She feels she may have a secondary sinus infection. She has been doing saline sinus rinses without relief.    Problems:  Patient Active Problem List   Diagnosis Date Noted   Colon cancer screening    Polyp of ascending colon    Abdominal bloating with cramps 05/01/2020   Cervical radiculopathy 03/07/2020   Myalgia due to statin 11/12/2019   Encounter for preventive health examination 11/12/2019   History of 2019 novel coronavirus disease (COVID-19) 11/10/2019   Breast calcifications on mammogram 06/21/2019   Chronic kidney disease, stage II (mild) 10/31/2018   Heel pain, bilateral 03/23/2018   Concussion with no loss of consciousness, subsequent encounter 03/23/2018   Polyarthritis of  multiple sites 05/13/2017   GERD (gastroesophageal reflux disease) 03/31/2017   Habitual snoring 01/31/2017   Ocular migraine 12/02/2016   Chronic diastolic CHF (congestive heart failure) ([place]) 08/24/2016   Carpal tunnel syndrome 02/07/2016   Encounter for therapeutic drug monitoring 12/07/2014   Status post thoracotomy 11/30/2014    S/P ICD (internal cardiac defibrillator) procedure 67/01/4579   Chronic systolic heart failure ([place]) 04/27/2014   Depression with anxiety 03/29/2014   Insomnia 03/29/2014   Amaurosis fugax 12/28/2013   Unspecified hereditary and idiopathic peripheral neuropathy 06/14/2013   Edema 06/14/2013   Visit for preventive health examination 05/05/2012   Dyspareunia, female 05/05/2012   B12 deficiency 04/20/2012   Cardiomyopathy, dilated, nonischemic (HCC)    Fatigue 03/11/2011   Asthma, chronic    Left bundle branch block    Mitral valve prolapse syndrome     Allergies:  Allergies  Allergen Reactions   Adhesive [Tape] Rash    Pulls skin off   Carvedilol Swelling and Other (See Comments)    headache swelling   Metoprolol Swelling and Other (See Comments)    Swelling and headache   Penicillin G Swelling    Other reaction(s): Localized superficial swelling of skin   Lisinopril Cough   Other Other (See Comments)    Adhesive pads on heart monitoring devices/ electrodes = skin irritation   Prednisone    Latex Rash   Levofloxacin Rash   Medications:  Current Outpatient Medications:    doxycycline (VIBRA-TABS) 100 MG tablet, Take 1 tablet (100 mg total) by mouth 2 (two) times daily., Disp: 20 tablet, Rfl: 0   AIMOVIG 140 MG/ML SOAJ, , Disp: , Rfl:    baclofen (LIORESAL) 10 MG tablet, Take 1 tablet (10 mg total) by mouth 2 (two) times daily., Disp: 60 each, Rfl: 1   cetirizine (ZYRTEC) 10 MG chewable tablet, Chew 10 mg by mouth daily. (Patient not taking: Reported on 10/24/2021), Disp: , Rfl:    cyanocobalamin (,VITAMIN B-12,) 1000 MCG/ML injection, INJECT 1 ML INTO THE MUSCLE ONCE A WEEK, Disp: 4 mL, Rfl: 1   dapagliflozin propanediol (FARXIGA) 10 MG TABS tablet, Take 1 tablet (10 mg total) by mouth daily., Disp: 90 tablet, Rfl: 3   DULoxetine (CYMBALTA) 60 MG capsule, TAKE ONE CAPSULE BY MOUTH ONCE DAILY (Patient not taking: Reported on 10/24/2021), Disp: 30 capsule, Rfl: 4   Elderberry  500 MG CAPS, Take 500 mg by mouth daily as needed., Disp: , Rfl:    ENTRESTO 24-26 MG, Take 0.5 tablets by mouth 2 (two) times daily., Disp: 90 tablet, Rfl: 3   etodolac (LODINE) 500 MG tablet, Take 500 mg by mouth 2 (two) times daily. (Patient not taking: Reported on 10/24/2021), Disp: , Rfl:    ezetimibe (ZETIA) 10 MG tablet, Take 1 tablet (10 mg total) by mouth daily., Disp: 90 tablet, Rfl: 3   fluticasone (FLONASE) 50 MCG/ACT nasal spray, USE 2 SPRAYS INTO BOTH NOSTRILS ONCE DAILY AS DIRECTED BY PHYSICIAN., Disp: 48 g, Rfl: 2   furosemide (LASIX) 20 MG tablet, Take 1 tablet (20 mg total) by mouth daily. Take extra 20 mg as needed., Disp: 135 tablet, Rfl: 3   gabapentin (NEURONTIN) 100 MG capsule, TAKE ONE CAPSULE BY MOUTH THREE TIMES DAILY, Disp: 180 capsule, Rfl: 0   molnupiravir EUA (LAGEVRIO) 200 mg CAPS capsule, Take 4 capsules (800 mg total) by mouth 2 (two) times daily for 5 days., Disp: 40 capsule, Rfl: 0   Multiple Minerals-Vitamins (YUMVS CALC-MAG-ZINC-VIT D PO), Take by mouth., Disp: ,  Rfl:    potassium chloride (KLOR-CON) 10 MEQ tablet, Take 2 tablets (20 mEq total) by mouth daily., Disp: 180 tablet, Rfl: 3   rosuvastatin (CRESTOR) 5 MG tablet, Take 1 tablet (5 mg total) by mouth daily., Disp: 90 tablet, Rfl: 3   spironolactone (ALDACTONE) 25 MG tablet, Take 1 tablet (25 mg total) by mouth daily., Disp: 90 tablet, Rfl: 1   Syringe/Needle, Disp, (SYRINGE 3CC/25GX1") 25G X 1" 3 ML MISC, Use to inject 1 mL of vitamin B12 intramuscular every 30 days., Disp: 50 each, Rfl: 0   traMADol (ULTRAM) 50 MG tablet, Take 50 mg by mouth every 6 (six) hours as needed., Disp: , Rfl:    traZODone (DESYREL) 50 MG tablet, TAKE 1/2 TO 1 TABLET BY MOUTH AT BEDTIMEAS NEEDED FOR SLEEP, Disp: 90 tablet, Rfl: 1   valACYclovir (VALTREX) 1000 MG tablet, Take 1 tablet (1,000 mg total) by mouth 2 (two) times daily as needed (fever blisters)., Disp: 20 tablet, Rfl: 3   venlafaxine XR (EFFEXOR-XR) 150 MG 24 hr  capsule, TAKE ONE CAPSULE BY MOUTH ONCE DAILY WITH BREAKFAST, Disp: 90 capsule, Rfl: 1  Observations/Objective: Patient is well-developed, well-nourished in no acute distress.  Resting comfortably at home.  Head is normocephalic, atraumatic.  No labored breathing.  Speech is clear and coherent with logical content.  Patient is alert and oriented at baseline.    Assessment and Plan: 1. Acute bacterial sinusitis - doxycycline (VIBRA-TABS) 100 MG tablet; Take 1 tablet (100 mg total) by mouth 2 (two) times daily.  Dispense: 20 tablet; Refill: 0  2. COVID-19  - Worsening symptoms; Suspect co-infection with covid 19 - Will give Doxycycline - Continue allergy medications.  - Steam and humidifier can help - Stay well hydrated and get plenty of rest.  - Seek in person evaluation if no symptom improvement or if symptoms worsen   Follow Up Instructions: I discussed the assessment and treatment plan with the patient. The patient was provided an opportunity to ask questions and all were answered. The patient agreed with the plan and demonstrated an understanding of the instructions.  A copy of instructions were sent to the patient via MyChart unless otherwise noted below.   The patient was advised to call back or seek an in-person evaluation if the symptoms worsen or if the condition fails to improve as anticipated.  Time:  I spent 12 minutes with the patient via telehealth technology discussing the above problems/concerns.    Mar Daring, PA-C

## 2021-11-20 ENCOUNTER — Ambulatory Visit: Payer: BC Managed Care – PPO

## 2021-12-04 ENCOUNTER — Ambulatory Visit: Payer: BC Managed Care – PPO | Attending: Internal Medicine

## 2021-12-04 DIAGNOSIS — I5022 Chronic systolic (congestive) heart failure: Secondary | ICD-10-CM

## 2021-12-04 LAB — CUP PACEART INCLINIC DEVICE CHECK
Battery Remaining Longevity: 97 mo
Battery Voltage: 3.04 V
Brady Statistic AP VP Percent: 1 %
Brady Statistic AP VS Percent: 0.05 %
Brady Statistic AS VP Percent: 97.63 %
Brady Statistic AS VS Percent: 1.31 %
Brady Statistic RA Percent Paced: 1.06 %
Brady Statistic RV Percent Paced: 3.61 %
Date Time Interrogation Session: 20231011090300
HighPow Impedance: 79 Ohm
Implantable Lead Implant Date: 20160421
Implantable Lead Implant Date: 20160421
Implantable Lead Implant Date: 20161006
Implantable Lead Implant Date: 20161006
Implantable Lead Location: 753858
Implantable Lead Location: 753858
Implantable Lead Location: 753859
Implantable Lead Location: 753860
Implantable Lead Model: 5071
Implantable Lead Model: 5071
Implantable Lead Model: 5076
Implantable Pulse Generator Implant Date: 20230831
Lead Channel Impedance Value: 323 Ohm
Lead Channel Impedance Value: 342 Ohm
Lead Channel Impedance Value: 399 Ohm
Lead Channel Impedance Value: 399 Ohm
Lead Channel Impedance Value: 4047 Ohm
Lead Channel Impedance Value: 4047 Ohm
Lead Channel Pacing Threshold Amplitude: 0.5 V
Lead Channel Pacing Threshold Amplitude: 1 V
Lead Channel Pacing Threshold Pulse Width: 0.4 ms
Lead Channel Pacing Threshold Pulse Width: 0.6 ms
Lead Channel Sensing Intrinsic Amplitude: 22 mV
Lead Channel Sensing Intrinsic Amplitude: 24.75 mV
Lead Channel Sensing Intrinsic Amplitude: 3.5 mV
Lead Channel Sensing Intrinsic Amplitude: 4.125 mV
Lead Channel Setting Pacing Amplitude: 1 V
Lead Channel Setting Pacing Amplitude: 1.5 V
Lead Channel Setting Pacing Amplitude: 1.5 V
Lead Channel Setting Pacing Pulse Width: 0.4 ms
Lead Channel Setting Pacing Pulse Width: 0.6 ms
Lead Channel Setting Sensing Sensitivity: 0.3 mV

## 2021-12-04 NOTE — Progress Notes (Signed)
Wound check appointment. Dermabond removed prior to OV. Wound without redness or edema. Incision edges approximated, wound well healed. Normal device function. Thresholds, sensing, and impedances consistent with implant measurements. Device programmed at chronic outputs, no new leads implanted. Histogram distribution appropriate for patient and level of activity. No mode switches or ventricular arrhythmias noted. Patient educated about wound care, shock plan. ROV in 3 months with implanting physician.

## 2021-12-04 NOTE — Patient Instructions (Addendum)
   After Your ICD (Implantable Cardiac Defibrillator)    Monitor your defibrillator site for redness, swelling, and drainage. Call the device clinic at [phone] if you experience these symptoms or fever/chills.  Your incision was closed with Dermabond:  You may shower 1 day after your defibrillator implant and wash your incision with soap and water. Avoid lotions, ointments, or perfumes over your incision until it is well-healed.  You may use a hot tub or a pool after your wound check appointment if the incision is completely closed.  Your ICD is not MRI compatible.  Your ICD is designed to protect you from life threatening heart rhythms. Because of this, you may receive a shock.   1 shock with no symptoms:  Call the office during business hours. 1 shock with symptoms (chest pain, chest pressure, dizziness, lightheadedness, shortness of breath, overall feeling unwell):  Call 911. If you experience 2 or more shocks in 24 hours:  Call 911. If you receive a shock, you should not drive.  [place] DMV - no driving for 6 months if you receive appropriate therapy from your ICD.   ICD Alerts:  Some alerts are vibratory and others beep. These are NOT emergencies. Please call our office to let us know. If this occurs at night or on weekends, it can wait until the next business day. Send a remote transmission.  If your device is capable of reading fluid status (for heart failure), you will be offered monthly monitoring to review this with you.   Remote monitoring is used to monitor your ICD from home. This monitoring is scheduled every 91 days by our office. It allows [name] to keep an eye on the functioning of your device to ensure it is working properly. You will routinely see your Electrophysiologist annually (more often if necessary).

## 2021-12-09 ENCOUNTER — Ambulatory Visit (INDEPENDENT_AMBULATORY_CARE_PROVIDER_SITE_OTHER): Payer: BC Managed Care – PPO

## 2021-12-09 DIAGNOSIS — I5022 Chronic systolic (congestive) heart failure: Secondary | ICD-10-CM

## 2021-12-09 DIAGNOSIS — Z95810 Presence of automatic (implantable) cardiac defibrillator: Secondary | ICD-10-CM | POA: Diagnosis not present

## 2021-12-11 NOTE — Progress Notes (Signed)
EPIC Encounter for ICM Monitoring  Patient Name: Suzanne Spears is a 61 y.o. female Date: 12/11/2021 Primary Care Physican: [name], MD Primary Cardiologist: Rockey Situ Electrophysiologist: Vergie Living Pacing:  98.5%    07/31/2021 Weight: 156 lbs  09/04/2021 Weight: 158 lbs 09/09/2021 Weight: 158 lbs          Attempted call to patient and unable to reach.   Transmission reviewed.     OptiVol Thoracic impedance normal but was suggesting possible fluid accumulation from 10/14-10-15   Prescribed:  Furosemide 20 mg Take1 tablet (20 mg) once daily. May take extra 20 mg as needed for weight gain or swelling.  Pt reports 7/12 she does not take Furosemide daily but tries to take it every other day if needed. Potassium 10 mEq take 2 tablets daily   Labs:   06/27/2021 Creatinine 1.37, BUN 32, Potassium 4.6, Sodium 139, GFR 44 03/08/2021 Creatinine 1.14, BUN 27, Potassium 4.1, Sodium 140, GFR 55 A complete set of results can be found in Results Review.   Recommendations:  Unable to reach.     Follow-up plan: ICM clinic phone appointment on 01/13/2022.  91 day device clinic remote transmission 01/23/2022.     EP/Cardiology Office Visits:  Recall 06/24/2022 with Dr Rockey Situ.  01/30/2022 with Dr Caryl Comes.   Copy of ICM check sent to Dr. Caryl Comes.  3 month ICM trend: 12/09/2021.    12-14 Month ICM trend:     [name], RN 12/11/2021 1:36 PM

## 2021-12-31 ENCOUNTER — Other Ambulatory Visit: Payer: Self-pay | Admitting: Student

## 2021-12-31 DIAGNOSIS — G43119 Migraine with aura, intractable, without status migrainosus: Secondary | ICD-10-CM

## 2022-01-08 ENCOUNTER — Ambulatory Visit
Admission: RE | Admit: 2022-01-08 | Discharge: 2022-01-08 | Disposition: A | Discharge status: Home / Self Care | Payer: BC Managed Care – PPO | Source: Ambulatory Visit | Attending: Student | Admitting: Student

## 2022-01-08 DIAGNOSIS — G43119 Migraine with aura, intractable, without status migrainosus: Secondary | ICD-10-CM | POA: Diagnosis present

## 2022-01-08 NOTE — Telephone Encounter (Signed)
We should cerainly be able to do this -- find out what MRI she needs ( exact order) and we can reach out Thanks SK

## 2022-01-13 ENCOUNTER — Ambulatory Visit (INDEPENDENT_AMBULATORY_CARE_PROVIDER_SITE_OTHER): Payer: BC Managed Care – PPO

## 2022-01-13 ENCOUNTER — Ambulatory Visit: Payer: BC Managed Care – PPO | Admitting: Internal Medicine

## 2022-01-13 DIAGNOSIS — Z95810 Presence of automatic (implantable) cardiac defibrillator: Secondary | ICD-10-CM

## 2022-01-13 DIAGNOSIS — I5022 Chronic systolic (congestive) heart failure: Secondary | ICD-10-CM | POA: Diagnosis not present

## 2022-01-13 NOTE — Progress Notes (Signed)
EPIC Encounter for ICM Monitoring  Patient Name: Suzanne Spears is a 61 y.o. female Date: 01/13/2022 Primary Care Physican: [name], MD Primary Cardiologist: Rockey Situ Electrophysiologist: Vergie Living Pacing:  98.5%    09/09/2021 Weight: 158 lbs 01/13/2022 Weight: 158 lbs          Spoke with patient and heart failure questions reviewed.  Transmission results reviewed.  Pt asymptomatic for fluid accumulation.  Reports feeling well at this time and voices no complaints.  She has not been following low salt diet, taking Lasix and potassium as prescribed the last few weeks because she has been traveling.        OptiVol Thoracic impedance normal but was suggesting possible fluid accumulation starting 10/27 and trending back close to normal.   Prescribed:  Furosemide 20 mg Take1 tablet (20 mg) once daily. May take extra 20 mg as needed for weight gain or swelling.   Potassium 10 mEq take 2 tablets daily   Labs: 09/30/2021 Creatinine 1.32, BUN 28, Potassium 3.8, Sodium 135, GFR 46 09/27/2021 Creatinine 1.45, BUN 27, Potassium 4.2, Sodium 140, GFR 41  06/27/2021 Creatinine 1.37, BUN 32, Potassium 4.6, Sodium 139, GFR 44 03/08/2021 Creatinine 1.14, BUN 27, Potassium 4.1, Sodium 140, GFR 55 A complete set of results can be found in Results Review.   Recommendations:  Advised to take Lasix and potassium as prescribed and follow low salt diet. Reminded her she san take extra 20 mg lasix if she develops fluid.  Encouraged to call for changes in condition.    Follow-up plan: ICM clinic phone appointment on 02/18/2022.  91 day device clinic remote transmission 01/23/2022.     EP/Cardiology Office Visits:  Recall 06/24/2022 with Dr Rockey Situ.  01/30/2022 with Dr Caryl Comes.   Copy of ICM check sent to Dr. Caryl Comes.   3 month ICM trend: 01/13/2022.    12-14 Month ICM trend:     [name], RN 01/13/2022 2:03 PM

## 2022-01-20 ENCOUNTER — Encounter: Payer: Self-pay | Admitting: Cardiovascular Disease

## 2022-01-20 ENCOUNTER — Other Ambulatory Visit: Payer: Self-pay

## 2022-01-20 MED ORDER — DAPAGLIFLOZIN PROPANEDIOL 10 MG PO TABS: 10.0000 mg | ORAL_TABLET | Freq: Every day | ORAL | 1 refills | Status: DC

## 2022-01-23 ENCOUNTER — Ambulatory Visit (INDEPENDENT_AMBULATORY_CARE_PROVIDER_SITE_OTHER): Payer: BC Managed Care – PPO

## 2022-01-23 DIAGNOSIS — I428 Other cardiomyopathies: Secondary | ICD-10-CM

## 2022-01-23 LAB — CUP PACEART REMOTE DEVICE CHECK
Battery Remaining Longevity: 91 mo
Battery Voltage: 3.03 V
Brady Statistic AP VP Percent: 1.77 %
Brady Statistic AP VS Percent: 0.08 %
Brady Statistic AS VP Percent: 96.87 %
Brady Statistic AS VS Percent: 1.28 %
Brady Statistic RA Percent Paced: 1.85 %
Brady Statistic RV Percent Paced: 5.64 %
Date Time Interrogation Session: 20231130043625
HighPow Impedance: 87 Ohm
Implantable Lead Connection Status: 753985
Implantable Lead Connection Status: 753985
Implantable Lead Connection Status: 753985
Implantable Lead Connection Status: 753985
Implantable Lead Implant Date: 20160421
Implantable Lead Implant Date: 20160421
Implantable Lead Implant Date: 20161006
Implantable Lead Implant Date: 20161006
Implantable Lead Location: 753858
Implantable Lead Location: 753858
Implantable Lead Location: 753859
Implantable Lead Location: 753860
Implantable Lead Model: 5071
Implantable Lead Model: 5071
Implantable Lead Model: 5076
Implantable Pulse Generator Implant Date: 20230831
Lead Channel Impedance Value: 323 Ohm
Lead Channel Impedance Value: 323 Ohm
Lead Channel Impedance Value: 380 Ohm
Lead Channel Impedance Value: 380 Ohm
Lead Channel Impedance Value: 4047 Ohm
Lead Channel Impedance Value: 4047 Ohm
Lead Channel Pacing Threshold Amplitude: 0.5 V
Lead Channel Pacing Threshold Amplitude: 1.125 V
Lead Channel Pacing Threshold Pulse Width: 0.4 ms
Lead Channel Pacing Threshold Pulse Width: 0.6 ms
Lead Channel Sensing Intrinsic Amplitude: 20.375 mV
Lead Channel Sensing Intrinsic Amplitude: 20.375 mV
Lead Channel Sensing Intrinsic Amplitude: 3.75 mV
Lead Channel Sensing Intrinsic Amplitude: 3.75 mV
Lead Channel Setting Pacing Amplitude: 1 V
Lead Channel Setting Pacing Amplitude: 1.5 V
Lead Channel Setting Pacing Amplitude: 1.75 V
Lead Channel Setting Pacing Pulse Width: 0.4 ms
Lead Channel Setting Pacing Pulse Width: 0.6 ms
Lead Channel Setting Sensing Sensitivity: 0.3 mV
Zone Setting Status: 755011
Zone Setting Status: 755011

## 2022-01-24 ENCOUNTER — Ambulatory Visit: Payer: BC Managed Care – PPO | Admitting: Internal Medicine

## 2022-01-27 ENCOUNTER — Encounter: Payer: Self-pay | Admitting: Internal Medicine

## 2022-01-29 ENCOUNTER — Encounter: Payer: Self-pay | Admitting: Internal Medicine

## 2022-01-29 ENCOUNTER — Other Ambulatory Visit: Payer: Self-pay | Admitting: Internal Medicine

## 2022-01-30 ENCOUNTER — Other Ambulatory Visit
Admission: RE | Admit: 2022-01-30 | Discharge: 2022-01-30 | Disposition: A | Discharge status: Home / Self Care | Payer: BC Managed Care – PPO | Source: Ambulatory Visit | Attending: Internal Medicine | Admitting: Internal Medicine

## 2022-01-30 ENCOUNTER — Encounter: Payer: Self-pay | Admitting: Internal Medicine

## 2022-01-30 ENCOUNTER — Ambulatory Visit: Payer: BC Managed Care – PPO | Attending: Internal Medicine | Admitting: Internal Medicine

## 2022-01-30 VITALS — BP 120/66 | HR 78 | Ht 65.5 in | Wt 160.8 lb

## 2022-01-30 DIAGNOSIS — I5022 Chronic systolic (congestive) heart failure: Secondary | ICD-10-CM | POA: Insufficient documentation

## 2022-01-30 DIAGNOSIS — I428 Other cardiomyopathies: Secondary | ICD-10-CM | POA: Diagnosis present

## 2022-01-30 DIAGNOSIS — Z95810 Presence of automatic (implantable) cardiac defibrillator: Secondary | ICD-10-CM

## 2022-01-30 DIAGNOSIS — Z13220 Encounter for screening for lipoid disorders: Secondary | ICD-10-CM | POA: Insufficient documentation

## 2022-01-30 LAB — CBC
HCT: 46.5 % — ABNORMAL HIGH (ref 36.0–46.0)
Hemoglobin: 14.7 g/dL (ref 12.0–15.0)
MCH: 29.9 pg (ref 26.0–34.0)
MCHC: 31.6 g/dL (ref 30.0–36.0)
MCV: 94.7 fL (ref 80.0–100.0)
Platelets: 230 10*3/uL (ref 150–400)
RBC: 4.91 MIL/uL (ref 3.87–5.11)
RDW: 13.7 % (ref 11.5–15.5)
WBC: 4.6 10*3/uL (ref 4.0–10.5)
nRBC: 0 % (ref 0.0–0.2)

## 2022-01-30 LAB — COMPREHENSIVE METABOLIC PANEL
ALT: 18 U/L (ref 0–44)
AST: 25 U/L (ref 15–41)
Albumin: 4.2 g/dL (ref 3.5–5.0)
Alkaline Phosphatase: 97 U/L (ref 38–126)
Anion gap: 10 (ref 5–15)
BUN: 22 mg/dL — ABNORMAL HIGH (ref 6–20)
CO2: 26 mmol/L (ref 22–32)
Calcium: 9.4 mg/dL (ref 8.9–10.3)
Chloride: 103 mmol/L (ref 98–111)
Creatinine, Ser: 1.28 mg/dL — ABNORMAL HIGH (ref 0.44–1.00)
GFR, Estimated: 48 mL/min — ABNORMAL LOW (ref >60)
Glucose, Bld: 84 mg/dL (ref 70–99)
Potassium: 4.5 mmol/L (ref 3.5–5.1)
Sodium: 139 mmol/L (ref 135–145)
Total Bilirubin: 0.8 mg/dL (ref 0.3–1.2)
Total Protein: 7.2 g/dL (ref 6.5–8.1)

## 2022-01-30 LAB — LIPID PANEL
Cholesterol: 138 mg/dL (ref 0–200)
HDL: 67 mg/dL (ref >40)
LDL Cholesterol: 64 mg/dL (ref 0–99)
Total CHOL/HDL Ratio: 2.1 RATIO
Triglycerides: 35 mg/dL (ref <150)
VLDL: 7 mg/dL (ref 0–40)

## 2022-01-30 LAB — LDL CHOLESTEROL, DIRECT: Direct LDL: 62 mg/dL (ref 0–99)
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
## 2022-01-30 NOTE — Addendum Note (Signed)
Addended by: Levonne Hubert on: 01/30/2022 11:55 AM   Modules accepted: Orders

## 2022-01-30 NOTE — Patient Instructions (Signed)
Medication Instructions:  - Your physician recommends that you continue on your current medications as directed. Please refer to the Current Medication list given to you today.  *If you need a refill on your cardiac medications before your next appointment, please call your pharmacy*   Lab Work: - Your physician recommends that you have lab work today:  CMET/ Lipid/ Direct LDL/ CBC  Nature conservation officer at [facility] 1st desk on the right to check in (REGISTRATION)  Lab hours: Monday- Friday (7:30 am- 5:30 pm)   If you have labs (blood work) drawn today and your tests are completely normal, you will receive your results only by: MyChart Message (if you have MyChart) OR A paper copy in the mail If you have any lab test that is abnormal or we need to change your treatment, we will call you to review the results.   Testing/Procedures: - none ordered   Follow-Up: At [facility], you and your health needs are our priority.  As part of our continuing mission to provide you with exceptional heart care, we have created designated Provider Care Teams.  These Care Teams include your primary Cardiologist (physician) and Advanced Practice Providers (APPs -  Physician Assistants and Nurse Practitioners) who all work together to provide you with the care you need, when you need it.  We recommend signing up for the patient portal called "MyChart".  Sign up information is provided on this After Visit Summary.  MyChart is used to connect with patients for Virtual Visits (Telemedicine).  Patients are able to view lab/test results, encounter notes, upcoming appointments, etc.  Non-urgent messages can be sent to your provider as well.   To learn more about what you can do with MyChart, go to [email or web address].    Your next appointment:   8 month(s)  The format for your next appointment:   In Person  Provider:   Virl Axe, MD    Other Instructions N/a  Important Information About  [place]

## 2022-01-31 MED ORDER — "SYRINGE 25G X 1"" 3 ML MISC": 0 refills | Status: DC

## 2022-01-31 MED ORDER — GABAPENTIN 100 MG PO CAPS: 100.0000 mg | ORAL_CAPSULE | Freq: Three times a day (TID) | ORAL | 0 refills | Status: DC

## 2022-01-31 MED ORDER — BACLOFEN 10 MG PO TABS: 10.0000 mg | ORAL_TABLET | Freq: Two times a day (BID) | ORAL | 1 refills | Status: DC

## 2022-01-31 MED ORDER — CYANOCOBALAMIN 1000 MCG/ML IJ SOLN: 1000.0000 ug | INTRAMUSCULAR | 1 refills | Status: DC

## 2022-01-31 NOTE — Telephone Encounter (Signed)
Refilled: 01/11/2020 Last OV: 03/08/2021 Next OV: 03/10/2022

## 2022-02-07 ENCOUNTER — Telehealth: Payer: Self-pay | Admitting: Cardiovascular Disease

## 2022-02-07 DIAGNOSIS — Z0279 Encounter for issue of other medical certificate: Secondary | ICD-10-CM

## 2022-02-07 NOTE — Telephone Encounter (Signed)
Patient filled out FMLA forms paid $29 check placed in box.

## 2022-02-10 NOTE — Telephone Encounter (Signed)
Forms obtained from nurse bin and completed as much as possible. I have placed these on Dr. Donivan Scull desk for completion and signature.

## 2022-02-11 ENCOUNTER — Encounter: Payer: Self-pay | Admitting: Internal Medicine

## 2022-02-11 MED ORDER — VENLAFAXINE HCL ER 150 MG PO CP24: ORAL_CAPSULE | ORAL | 1 refills | Status: DC

## 2022-02-11 MED ORDER — ROSUVASTATIN CALCIUM 5 MG PO TABS: 5.0000 mg | ORAL_TABLET | Freq: Every day | ORAL | 3 refills | Status: DC

## 2022-02-11 NOTE — Progress Notes (Signed)
Remote ICD transmission.   

## 2022-02-18 ENCOUNTER — Ambulatory Visit (INDEPENDENT_AMBULATORY_CARE_PROVIDER_SITE_OTHER): Payer: BC Managed Care – PPO

## 2022-02-18 ENCOUNTER — Telehealth: Payer: Self-pay

## 2022-02-18 DIAGNOSIS — I5022 Chronic systolic (congestive) heart failure: Secondary | ICD-10-CM

## 2022-02-18 DIAGNOSIS — Z95810 Presence of automatic (implantable) cardiac defibrillator: Secondary | ICD-10-CM | POA: Diagnosis not present

## 2022-02-18 NOTE — Progress Notes (Signed)
EPIC Encounter for ICM Monitoring  Patient Name: Suzanne Spears is a 61 y.o. female Date: 02/18/2022 Primary Care Physican: [name], MD Primary Cardiologist: Rockey Situ Electrophysiologist: Vergie Living Pacing:  98.5%    09/09/2021 Weight: 158 lbs 01/13/2022 Weight: 158 lbs          Attempted call to patient and unable to reach.  Left detailed message per DPR regarding transmission. Transmission reviewed.     OptiVol Thoracic impedance suggesting possible fluid accumulation starting 12/23.   Prescribed:  Furosemide 20 mg Take1 tablet (20 mg) once daily. May take extra 20 mg as needed for weight gain or swelling.   Potassium 10 mEq take 2 tablets daily   Labs: 01/30/2022 Creatinine 1.28, BUN 22 Potassium 4.5, Sodium 139, GFR 48 09/30/2021 Creatinine 1.32, BUN 28, Potassium 3.8, Sodium 135, GFR 46 09/27/2021 Creatinine 1.45, BUN 27, Potassium 4.2, Sodium 140, GFR 41  06/27/2021 Creatinine 1.37, BUN 32, Potassium 4.6, Sodium 139, GFR 44 03/08/2021 Creatinine 1.14, BUN 27, Potassium 4.1, Sodium 140, GFR 55 A complete set of results can be found in Results Review.   Recommendations:  Left voice mail with ICM number and encouraged to call if experiencing any fluid symptoms.  Will advise to take extra Furosemide as prescribed if patient is reached.    Follow-up plan: ICM clinic phone appointment on 02/28/2022 to recheck fluid levels.  91 day device clinic remote transmission 04/24/2022.     EP/Cardiology Office Visits:  Recall 06/24/2022 with Dr Rockey Situ.  Recall 09/26/2022 with Dr Caryl Comes.   Copy of ICM check sent to Dr. Caryl Comes.   3 month ICM trend: 02/18/2022.    12-14 Month ICM trend:     [name], RN 02/18/2022 10:54 AM

## 2022-02-18 NOTE — Telephone Encounter (Signed)
Remote ICM transmission received.  Attempted call to patient regarding ICM remote transmission and left detailed message per DPR.  Advised to return call for any fluid symptoms or questions. Next ICM remote transmission scheduled 02/28/2022.

## 2022-02-25 ENCOUNTER — Encounter: Payer: Self-pay | Admitting: Cardiovascular Disease

## 2022-02-25 MED ORDER — EZETIMIBE 10 MG PO TABS: 10.0000 mg | ORAL_TABLET | Freq: Every day | ORAL | 3 refills | Status: DC

## 2022-02-27 NOTE — Telephone Encounter (Signed)
Reviewed the patient's chart- forms still sitting on Dr. Donivan Scull desk to review/ sign. Message left for MD to please complete.

## 2022-02-28 ENCOUNTER — Ambulatory Visit (INDEPENDENT_AMBULATORY_CARE_PROVIDER_SITE_OTHER): Payer: BC Managed Care – PPO

## 2022-02-28 DIAGNOSIS — I5022 Chronic systolic (congestive) heart failure: Secondary | ICD-10-CM

## 2022-02-28 DIAGNOSIS — Z95810 Presence of automatic (implantable) cardiac defibrillator: Secondary | ICD-10-CM

## 2022-02-28 NOTE — Telephone Encounter (Signed)
Completed forms received from Dr. Rockey Situ. Placed on [place] desk.

## 2022-02-28 NOTE — Progress Notes (Signed)
EPIC Encounter for ICM Monitoring  Patient Name: Suzanne Spears is a 62 y.o. female Date: 02/28/2022 Primary Care Physican: [name], MD Primary Cardiologist: Rockey Situ Electrophysiologist: Vergie Living Pacing:  98.5%    09/09/2021 Weight: 158 lbs 01/13/2022 Weight: 158 lbs          Transmission reviewed.     OptiVol Thoracic impedance suggesting fluid levels returned to normal.   Prescribed:  Furosemide 20 mg Take1 tablet (20 mg) once daily. May take extra 20 mg as needed for weight gain or swelling.   Potassium 10 mEq take 2 tablets daily   Labs: 01/30/2022 Creatinine 1.28, BUN 22 Potassium 4.5, Sodium 139, GFR 48 09/30/2021 Creatinine 1.32, BUN 28, Potassium 3.8, Sodium 135, GFR 46 09/27/2021 Creatinine 1.45, BUN 27, Potassium 4.2, Sodium 140, GFR 41  06/27/2021 Creatinine 1.37, BUN 32, Potassium 4.6, Sodium 139, GFR 44 03/08/2021 Creatinine 1.14, BUN 27, Potassium 4.1, Sodium 140, GFR 55 A complete set of results can be found in Results Review.   Recommendations:  No changes   Follow-up plan: ICM clinic phone appointment on 03/24/2022.  91 day device clinic remote transmission 04/24/2022.     EP/Cardiology Office Visits:  Recall 06/24/2022 with Dr Rockey Situ.  Recall 09/26/2022 with Dr Caryl Comes.   Copy of ICM check sent to Dr. Caryl Comes.   3 month ICM trend: 02/28/2022.    12-14 Month ICM trend:     [name], RN 02/28/2022 2:37 PM

## 2022-03-04 ENCOUNTER — Encounter: Payer: Self-pay | Admitting: Cardiovascular Disease

## 2022-03-05 NOTE — Telephone Encounter (Signed)
Forms signed & called patient ready for pick up place at front desk

## 2022-03-10 ENCOUNTER — Encounter: Payer: Self-pay | Admitting: Internal Medicine

## 2022-03-10 ENCOUNTER — Ambulatory Visit (INDEPENDENT_AMBULATORY_CARE_PROVIDER_SITE_OTHER): Payer: No Typology Code available for payment source | Admitting: Internal Medicine

## 2022-03-10 ENCOUNTER — Other Ambulatory Visit (HOSPITAL_COMMUNITY)
Admission: RE | Admit: 2022-03-10 | Discharge: 2022-03-10 | Disposition: A | Discharge status: Home / Self Care | Payer: No Typology Code available for payment source | Source: Ambulatory Visit | Attending: Internal Medicine | Admitting: Internal Medicine

## 2022-03-10 VITALS — BP 118/64 | HR 77 | Temp 98.3°F | Ht 65.5 in | Wt 160.4 lb

## 2022-03-10 DIAGNOSIS — R7301 Impaired fasting glucose: Secondary | ICD-10-CM | POA: Diagnosis not present

## 2022-03-10 DIAGNOSIS — Z0000 Encounter for general adult medical examination without abnormal findings: Secondary | ICD-10-CM | POA: Diagnosis not present

## 2022-03-10 DIAGNOSIS — E538 Deficiency of other specified B group vitamins: Secondary | ICD-10-CM | POA: Diagnosis not present

## 2022-03-10 DIAGNOSIS — F418 Other specified anxiety disorders: Secondary | ICD-10-CM

## 2022-03-10 DIAGNOSIS — Z1231 Encounter for screening mammogram for malignant neoplasm of breast: Secondary | ICD-10-CM | POA: Diagnosis not present

## 2022-03-10 DIAGNOSIS — Z124 Encounter for screening for malignant neoplasm of cervix: Secondary | ICD-10-CM | POA: Diagnosis present

## 2022-03-10 DIAGNOSIS — Z23 Encounter for immunization: Secondary | ICD-10-CM | POA: Diagnosis not present

## 2022-03-10 DIAGNOSIS — R5382 Chronic fatigue, unspecified: Secondary | ICD-10-CM

## 2022-03-10 DIAGNOSIS — R635 Abnormal weight gain: Secondary | ICD-10-CM

## 2022-03-10 LAB — TSH: TSH: 1 u[IU]/mL (ref 0.35–5.50)

## 2022-03-10 LAB — B12 AND FOLATE PANEL
Folate: 16 ng/mL (ref >5.9)
Vitamin B-12: 652 pg/mL (ref 211–911)

## 2022-03-10 LAB — HEMOGLOBIN A1C: Hgb A1c MFr Bld: 6 % (ref 4.6–6.5)

## 2022-03-10 MED ORDER — VALACYCLOVIR HCL 1 G PO TABS: 1000.0000 mg | ORAL_TABLET | Freq: Two times a day (BID) | ORAL | 3 refills | Status: AC | PRN

## 2022-03-10 MED ORDER — TRAZODONE HCL 50 MG PO TABS: ORAL_TABLET | ORAL | 1 refills | Status: DC

## 2022-03-10 MED ORDER — BACLOFEN 10 MG PO TABS: 10.0000 mg | ORAL_TABLET | Freq: Two times a day (BID) | ORAL | 1 refills | Status: DC

## 2022-03-10 MED ORDER — RSVPREF3 VAC RECOMB ADJUVANTED 120 MCG/0.5ML IM SUSR: 0.5000 mL | Freq: Once | INTRAMUSCULAR | 0 refills | Status: AC

## 2022-03-10 MED ORDER — CYANOCOBALAMIN 1000 MCG/ML IJ SOLN: 1000.0000 ug | INTRAMUSCULAR | 1 refills | Status: DC

## 2022-03-10 MED ORDER — GABAPENTIN 100 MG PO CAPS: 100.0000 mg | ORAL_CAPSULE | Freq: Three times a day (TID) | ORAL | 1 refills | Status: DC

## 2022-03-10 NOTE — Assessment & Plan Note (Signed)

## 2022-03-10 NOTE — Assessment & Plan Note (Signed)
Improved with effexor dose from 75 to 150 mg daily.

## 2022-03-10 NOTE — Patient Instructions (Addendum)
Y ou received the flu vaccine today   I also  recommend the RSV vaccine for you, given your history of asthma.  It is now available through the office If you are younger than 67. You can also check with [facility], [facility] and [facility]

## 2022-03-10 NOTE — Progress Notes (Signed)
Patient ID: Suzanne Spears, female    DOB: 1960-10-30  Age: 62 y.o. MRN: [id number]  The patient is here for annual preventive examination and management of other chronic and acute problems.   The risk factors are reflected in the social history.   The roster of all physicians providing medical care to patient - is listed in the Snapshot section of the chart.   Activities of daily living:  The patient is 100% independent in all ADLs: dressing, toileting, feeding as well as independent mobility   Home safety : The patient has smoke detectors in the home. They wear seatbelts.  There are no unsecured firearms at home. There is no violence in the home.    There is no risks for hepatitis, STDs or HIV. There is no   history of blood transfusion. They have no travel history to infectious disease endemic areas of the world.   The patient has seen their dentist in the last six month. They have seen their eye doctor in the last year. The patinet  denies slight hearing difficulty with regard to whispered voices and some television programs.  They have deferred audiologic testing in the last year.  They do not  have excessive sun exposure. Discussed the need for sun protection: hats, long sleeves and use of sunscreen if there is significant sun exposure.    Diet: the importance of a healthy diet is discussed. They do have a healthy diet.   The benefits of regular aerobic exercise were discussed. The patient  exercises  3 to 5 days per week  for  60 minutes.    Depression screen: there are no signs or vegative symptoms of depression- irritability, change in appetite, anhedonia, sadness/tearfullness.   The following portions of the patient's history were reviewed and updated as appropriate: allergies, current medications, past family history, past medical history,  past surgical history, past social history  and problem list.   Visual acuity was not assessed per patient preference since the patient has  regular follow up with an  ophthalmologist. Hearing and body mass index were assessed and reviewed.    During the course of the visit the patient was educated and counseled about appropriate screening and preventive services including : fall prevention , diabetes screening, nutrition counseling, colorectal cancer screening, and recommended immunizations.    Chief Complaint:  1) ICM/pacer  in 3025,  , battery replaced in August :  s/p biventricular ICD.  E/F 40 to 45% by May ECHO  2) Migraines, worsening ,   facial numbness   bilateral upper extremity  numbness and pain (right worse than left) diagnosed with CTS and and cervical  DDD  contributing : seeing Manuella Ghazi ,and Chasnis. Last ESI series done in the cervical spine mid year.     EMG/[place] studies ordered .  CT head Nov 2023 nothing acute   received steroid injection in right wrist.  Still having pain in thumb 1st and 2nd fingers .  3) needs mammogram  4) Stress"  feels overwhelmed at work due to her medical conditions .     Review of Symptoms  Patient denies headache, fevers, malaise, unintentional weight loss, skin rash, eye pain, sinus congestion and sinus pain, sore throat, dysphagia,  hemoptysis , cough, dyspnea, wheezing, chest pain, palpitations, orthopnea, edema, abdominal pain, nausea, melena, diarrhea, constipation, flank pain, dysuria, hematuria, urinary  Frequency, nocturia, numbness, tingling, seizures,  Focal weakness, Loss of consciousness,  Tremor, insomnia, depression, anxiety, and suicidal ideation.  Physical Exam:  BP 118/64   Pulse 77   Temp 98.3 F (36.8 C) (Oral)   Ht 5' 5.5" (1.664 m)   Wt 160 lb 6.4 oz (72.8 kg)   LMP 07/12/2016   SpO2 99%   BMI 26.29 kg/m    Physical Exam Vitals reviewed.  Constitutional:      General: She is not in acute distress.    Appearance: Normal appearance. She is well-developed and normal weight. She is not ill-appearing, toxic-appearing or diaphoretic.  HENT:     Head:  Normocephalic.     Right Ear: Tympanic membrane, ear canal and external ear normal. There is no impacted cerumen.     Left Ear: Tympanic membrane, ear canal and external ear normal. There is no impacted cerumen.     Nose: Nose normal.     Mouth/Throat:     Mouth: Mucous membranes are moist.     Pharynx: Oropharynx is clear.  Eyes:     General: No scleral icterus.       Right eye: No discharge.        Left eye: No discharge.     Conjunctiva/sclera: Conjunctivae normal.     Pupils: Pupils are equal, round, and reactive to light.  Neck:     Thyroid: No thyromegaly.     Vascular: No carotid bruit or JVD.  Cardiovascular:     Rate and Rhythm: Normal rate and regular rhythm.     Heart sounds: Normal heart sounds.  Pulmonary:     Effort: Pulmonary effort is normal. No respiratory distress.     Breath sounds: Normal breath sounds.  Chest:  Breasts:    Breasts are symmetrical.     Right: Normal. No swelling, inverted nipple, mass, nipple discharge, skin change or tenderness.     Left: Normal. No swelling, inverted nipple, mass, nipple discharge, skin change or tenderness.  Abdominal:     General: Bowel sounds are normal.     Palpations: Abdomen is soft. There is no mass.     Tenderness: There is no abdominal tenderness. There is no guarding or rebound.     Hernia: There is no hernia in the left inguinal area or right inguinal area.  Genitourinary:    Exam position: Lithotomy position.     Pubic Area: No rash or pubic lice.      Labia:        Right: No rash, tenderness, lesion or injury.        Left: No rash, tenderness, lesion or injury.      Vagina: Normal.     Cervix: Normal.     Uterus: Normal.      Adnexa: Right adnexa normal and left adnexa normal.  Musculoskeletal:        General: Normal range of motion.     Cervical back: Normal range of motion and neck supple.  Lymphadenopathy:     Cervical: No cervical adenopathy.     Upper Body:     Right upper body: No  supraclavicular, axillary or pectoral adenopathy.     Left upper body: No supraclavicular, axillary or pectoral adenopathy.     Lower Body: No right inguinal adenopathy. No left inguinal adenopathy.  Skin:    General: Skin is warm and dry.  Neurological:     General: No focal deficit present.     Mental Status: She is alert and oriented to person, place, and time. Mental status is at baseline.  Psychiatric:  Mood and Affect: Mood normal.        Behavior: Behavior normal.        Thought Content: Thought content normal.        Judgment: Judgment normal.     Assessment and Plan: Encounter for screening mammogram for malignant neoplasm of breast -     3D Screening Mammogram, Left and Right; Future  Cervical cancer screening -     Cytology - PAP  Impaired fasting glucose  Weight gain -     TSH -     Hemoglobin A1c  B12 deficiency -     B12 and Folate Panel  Need for immunization against influenza -     Flu Vaccine QUAD 50moIM (Fluarix, Fluzone & Alfiuria Quad PF)  Visit for preventive health examination Assessment & Plan: age appropriate education and counseling updated, referrals for preventative services and immunizations addressed, dietary and smoking counseling addressed, most recent labs reviewed.  I have personally reviewed and have noted:   1) the patient's medical and social history 2) The pt's use of alcohol, tobacco, and illicit drugs 3) The patient's current medications and supplements 4) Functional ability including ADL's, fall risk, home safety risk, hearing and visual impairment 5) Diet and physical activities 6) Evidence for depression or mood disorder 7) The patient's height, weight, and BMI have been recorded in the chart   I have made referrals, and provided counseling and education based on review of the above    Chronic fatigue Assessment & Plan: Chronic and multifactorial (cardiomyopathy, pain and depression).  Improvement less than  anticipated  with working from home. Sleep study done to rule out OSA in March 2019 noted hypersomnia and lack of REM sleep , but no OSA .    Depression with anxiety Assessment & Plan: Improved with effexor dose from 75 to 150 mg daily.    Other orders -     Baclofen; Take 1 tablet (10 mg total) by mouth 2 (two) times daily.  Dispense: 60 each; Refill: 1 -     Cyanocobalamin; Inject 1 mL (1,000 mcg total) into the muscle every 30 (thirty) days.  Dispense: 10 mL; Refill: 1 -     Gabapentin; Take 1 capsule (100 mg total) by mouth 3 (three) times daily.  Dispense: 180 capsule; Refill: 1 -     traZODone HCl; TAKE 1/2 TO 1 TABLET BY MOUTH AT BEDTIMEAS NEEDED FOR SLEEP  Dispense: 90 tablet; Refill: 1 -     valACYclovir HCl; Take 1 tablet (1,000 mg total) by mouth 2 (two) times daily as needed (fever blisters).  Dispense: 20 tablet; Refill: 3 -     RSVPreF3 Vac Recomb Adjuvanted; Inject 0.5 mLs into the muscle once for 1 dose.  Dispense: 0.5 mL; Refill: 0    No follow-ups on file.  TCrecencio Mc MD

## 2022-03-10 NOTE — Assessment & Plan Note (Signed)
Chronic and multifactorial (cardiomyopathy, pain and depression).  Improvement less than anticipated  with working from home. Sleep study done to rule out OSA in March 2019 noted hypersomnia and lack of REM sleep , but no OSA .

## 2022-03-11 LAB — CYTOLOGY - PAP
Comment: NEGATIVE
Diagnosis: NEGATIVE
High risk HPV: NEGATIVE

## 2022-03-24 ENCOUNTER — Ambulatory Visit: Payer: No Typology Code available for payment source | Attending: Internal Medicine

## 2022-03-24 DIAGNOSIS — I5022 Chronic systolic (congestive) heart failure: Secondary | ICD-10-CM | POA: Diagnosis not present

## 2022-03-24 DIAGNOSIS — Z95810 Presence of automatic (implantable) cardiac defibrillator: Secondary | ICD-10-CM | POA: Diagnosis not present

## 2022-03-26 NOTE — Progress Notes (Signed)
EPIC Encounter for ICM Monitoring  Patient Name: Suzanne Spears is a 62 y.o. female Date: 03/26/2022 Primary Care Physican: [name], MD Primary Cardiologist: Rockey Situ Electrophysiologist: Vergie Living Pacing:  98.4%    09/09/2021 Weight: 158 lbs 01/13/2022 Weight: 158 lbs          Attempted call to patient and unable to reach.  Left detailed message per DPR regarding transmission. Transmission reviewed.    OptiVol Thoracic impedance suggesting intermittent days with possible fluid accumulation within the last month.    Prescribed:  Furosemide 20 mg Take1 tablet (20 mg) once daily. May take extra 20 mg as needed for weight gain or swelling.   Potassium 10 mEq take 2 tablets daily   Labs: 01/30/2022 Creatinine 1.28, BUN 22 Potassium 4.5, Sodium 139, GFR 48 09/30/2021 Creatinine 1.32, BUN 28, Potassium 3.8, Sodium 135, GFR 46 09/27/2021 Creatinine 1.45, BUN 27, Potassium 4.2, Sodium 140, GFR 41  06/27/2021 Creatinine 1.37, BUN 32, Potassium 4.6, Sodium 139, GFR 44 03/08/2021 Creatinine 1.14, BUN 27, Potassium 4.1, Sodium 140, GFR 55 A complete set of results can be found in Results Review.   Recommendations:  Left voice mail with ICM number and encouraged to call if experiencing any fluid symptoms.   Follow-up plan: ICM clinic phone appointment on 04/28/2022.  91 day device clinic remote transmission 04/24/2022.     EP/Cardiology Office Visits:  Recall 06/24/2022 with Dr Rockey Situ.  Recall 09/26/2022 with Dr Caryl Comes.   Copy of ICM check sent to Dr. Caryl Comes.     3 month ICM trend: 03/24/2022.    12-14 Month ICM trend:     [name], RN 03/26/2022 10:42 AM

## 2022-04-17 ENCOUNTER — Encounter: Payer: Self-pay | Admitting: Internal Medicine

## 2022-04-18 ENCOUNTER — Encounter: Payer: Self-pay | Admitting: Nurse Practitioner

## 2022-04-18 ENCOUNTER — Ambulatory Visit: Payer: No Typology Code available for payment source | Admitting: Nurse Practitioner

## 2022-04-18 VITALS — BP 110/78 | HR 87 | Temp 98.1°F | Ht 65.0 in | Wt 158.2 lb

## 2022-04-18 DIAGNOSIS — G43801 Other migraine, not intractable, with status migrainosus: Secondary | ICD-10-CM

## 2022-04-18 DIAGNOSIS — E782 Mixed hyperlipidemia: Secondary | ICD-10-CM | POA: Insufficient documentation

## 2022-04-18 DIAGNOSIS — G43109 Migraine with aura, not intractable, without status migrainosus: Secondary | ICD-10-CM

## 2022-04-18 DIAGNOSIS — R42 Dizziness and giddiness: Secondary | ICD-10-CM

## 2022-04-18 LAB — POCT URINALYSIS DIPSTICK
Bilirubin, UA: NEGATIVE
Blood, UA: NEGATIVE
Glucose, UA: POSITIVE — AB
Ketones, UA: NEGATIVE
Leukocytes, UA: NEGATIVE
Nitrite, UA: NEGATIVE
Protein, UA: NEGATIVE
Spec Grav, UA: 1.015 (ref 1.010–1.025)
Urobilinogen, UA: 0.2 E.U./dL
pH, UA: 7 (ref 5.0–8.0)

## 2022-04-18 NOTE — Patient Instructions (Signed)
Continue Qulipta and as as need Umbrelvy. Followed by neurologist. Work note provided.
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
## 2022-04-21 DIAGNOSIS — G43909 Migraine, unspecified, not intractable, without status migrainosus: Secondary | ICD-10-CM | POA: Insufficient documentation

## 2022-04-21 DIAGNOSIS — G43119 Migraine with aura, intractable, without status migrainosus: Secondary | ICD-10-CM | POA: Insufficient documentation

## 2022-04-21 NOTE — Assessment & Plan Note (Addendum)
Continue Qulipta and as needed [place]. Continue the current medication regimen. Please cool pack on the head Warning signs discussed with her.

## 2022-04-24 ENCOUNTER — Ambulatory Visit: Payer: No Typology Code available for payment source

## 2022-04-24 DIAGNOSIS — I420 Dilated cardiomyopathy: Secondary | ICD-10-CM | POA: Diagnosis not present

## 2022-04-24 LAB — CUP PACEART REMOTE DEVICE CHECK
Battery Remaining Longevity: 91 mo
Battery Voltage: 3.01 V
Brady Statistic AP VP Percent: 2.07 %
Brady Statistic AP VS Percent: 0.07 %
Brady Statistic AS VP Percent: 96.56 %
Brady Statistic AS VS Percent: 1.31 %
Brady Statistic RA Percent Paced: 2.14 %
Brady Statistic RV Percent Paced: 2.98 %
Date Time Interrogation Session: 20240229001602
HighPow Impedance: 108 Ohm
Implantable Lead Connection Status: 753985
Implantable Lead Connection Status: 753985
Implantable Lead Connection Status: 753985
Implantable Lead Connection Status: 753985
Implantable Lead Implant Date: 20160421
Implantable Lead Implant Date: 20160421
Implantable Lead Implant Date: 20161006
Implantable Lead Implant Date: 20161006
Implantable Lead Location: 753858
Implantable Lead Location: 753858
Implantable Lead Location: 753859
Implantable Lead Location: 753860
Implantable Lead Model: 5071
Implantable Lead Model: 5071
Implantable Lead Model: 5076
Implantable Pulse Generator Implant Date: 20230831
Lead Channel Impedance Value: 323 Ohm
Lead Channel Impedance Value: 342 Ohm
Lead Channel Impedance Value: 4047 Ohm
Lead Channel Impedance Value: 4047 Ohm
Lead Channel Impedance Value: 437 Ohm
Lead Channel Impedance Value: 437 Ohm
Lead Channel Pacing Threshold Amplitude: 0.5 V
Lead Channel Pacing Threshold Amplitude: 1 V
Lead Channel Pacing Threshold Pulse Width: 0.4 ms
Lead Channel Pacing Threshold Pulse Width: 0.6 ms
Lead Channel Sensing Intrinsic Amplitude: 26.875 mV
Lead Channel Sensing Intrinsic Amplitude: 26.875 mV
Lead Channel Sensing Intrinsic Amplitude: 3.875 mV
Lead Channel Sensing Intrinsic Amplitude: 3.875 mV
Lead Channel Setting Pacing Amplitude: 1 V
Lead Channel Setting Pacing Amplitude: 1.5 V
Lead Channel Setting Pacing Amplitude: 1.5 V
Lead Channel Setting Pacing Pulse Width: 0.4 ms
Lead Channel Setting Pacing Pulse Width: 0.6 ms
Lead Channel Setting Sensing Sensitivity: 0.3 mV
Zone Setting Status: 755011
Zone Setting Status: 755011

## 2022-04-28 ENCOUNTER — Ambulatory Visit: Payer: No Typology Code available for payment source | Attending: Internal Medicine

## 2022-04-28 ENCOUNTER — Telehealth: Payer: Self-pay

## 2022-04-28 DIAGNOSIS — Z95810 Presence of automatic (implantable) cardiac defibrillator: Secondary | ICD-10-CM

## 2022-04-28 DIAGNOSIS — I5022 Chronic systolic (congestive) heart failure: Secondary | ICD-10-CM

## 2022-04-28 NOTE — Telephone Encounter (Signed)
Remote ICM transmission received.  Attempted call to patient regarding ICM remote transmission and left detailed message per DPR.  Advised to return call for any fluid symptoms or questions.  Next ICM remote transmission scheduled 05/05/2022.

## 2022-04-28 NOTE — Progress Notes (Signed)
EPIC Encounter for ICM Monitoring  Patient Name: Suzanne Spears is a 62 y.o. female Date: 04/28/2022 Primary Care Physican: [name], MD Primary Cardiologist: Rockey Situ Electrophysiologist: Vergie Living Pacing:  98.4%    09/09/2021 Weight: 158 lbs 01/13/2022 Weight: 158 lbs          Attempted call to patient and unable to reach.  Left detailed message per DPR regarding transmission. Transmission reviewed.    OptiVol Thoracic impedance suggesting possible fluid accumulation starting 2/28.    Prescribed:  Furosemide 20 mg Take1 tablet (20 mg) once daily. May take extra 20 mg as needed for weight gain or swelling.   Potassium 10 mEq take 2 tablets daily   Labs: 01/30/2022 Creatinine 1.28, BUN 22, Potassium 4.5, Sodium 139, GFR 48 09/30/2021 Creatinine 1.32, BUN 28, Potassium 3.8, Sodium 135, GFR 46 09/27/2021 Creatinine 1.45, BUN 27, Potassium 4.2, Sodium 140, GFR 41  06/27/2021 Creatinine 1.37, BUN 32, Potassium 4.6, Sodium 139, GFR 44 03/08/2021 Creatinine 1.14, BUN 27, Potassium 4.1, Sodium 140, GFR 55 A complete set of results can be found in Results Review.   Recommendations:  Left voice mail with ICM number and encouraged to call if experiencing any fluid symptoms.   Will advise to take extra Furosemide as prescribed if patient is reached.     Follow-up plan: ICM clinic phone appointment on 05/05/2022 (manual) to recheck fluid levels.  91 day device clinic remote transmission 07/24/2022.     EP/Cardiology Office Visits:  Recall 06/24/2022 with Dr Rockey Situ.  Recall 09/26/2022 with Dr Caryl Comes.   Copy of ICM check sent to Dr. Caryl Comes.  Will send to Dr Rockey Situ for review if patient is reached.    3 month ICM trend: 04/28/2022.    12-14 Month ICM trend:     [name], RN 04/28/2022 3:12 PM

## 2022-05-05 ENCOUNTER — Telehealth: Payer: Self-pay

## 2022-05-05 NOTE — Telephone Encounter (Signed)
Attempted ICM call and left message to send manual remote transmission to recheck fluid levels.

## 2022-05-07 NOTE — Progress Notes (Signed)
No ICM remote transmission received for 05/05/2022 and next ICM transmission scheduled for 06/02/2022.

## 2022-05-14 ENCOUNTER — Other Ambulatory Visit: Payer: Self-pay | Admitting: Internal Medicine

## 2022-05-20 ENCOUNTER — Encounter: Payer: Self-pay | Admitting: Internal Medicine

## 2022-05-20 NOTE — Telephone Encounter (Signed)
There is no appts available in our office this week.

## 2022-05-23 NOTE — Progress Notes (Signed)
Remote ICD transmission.   

## 2022-05-26 ENCOUNTER — Encounter: Payer: Self-pay | Admitting: Cardiovascular Disease

## 2022-05-27 ENCOUNTER — Other Ambulatory Visit: Payer: Self-pay | Admitting: *Deleted

## 2022-05-27 DIAGNOSIS — I5032 Chronic diastolic (congestive) heart failure: Secondary | ICD-10-CM

## 2022-05-27 MED ORDER — FUROSEMIDE 20 MG PO TABS: 20.0000 mg | ORAL_TABLET | ORAL | 3 refills | Status: DC

## 2022-06-02 ENCOUNTER — Ambulatory Visit: Payer: No Typology Code available for payment source | Attending: Internal Medicine

## 2022-06-02 DIAGNOSIS — Z95810 Presence of automatic (implantable) cardiac defibrillator: Secondary | ICD-10-CM

## 2022-06-02 DIAGNOSIS — I5032 Chronic diastolic (congestive) heart failure: Secondary | ICD-10-CM

## 2022-06-04 NOTE — Progress Notes (Signed)
EPIC Encounter for ICM Monitoring  Patient Name: Suzanne Spears is a 62 y.o. female Date: 06/04/2022 Primary Care Physican: Sherlene Shams, MD Primary Cardiologist: Mariah Milling Electrophysiologist: Joycelyn Schmid Pacing:  98.3%    06/04/2022 Weight: 159 lbs           Spoke with patient and heart failure questions reviewed.  Transmission results reviewed.  Pt asymptomatic for fluid accumulation.  She did not have any fluid symptoms that she knows of during decreased impedance but also did not weigh during that time.     Diet: Not following low salt diet  OptiVol Thoracic impedance suggesting possible fluid accumulation starting 3/20 and returning to normal 4/6.    Prescribed:  Furosemide 20 mg Take1 tablet (20 mg) once daily. May take extra 20 mg as needed for weight gain or swelling.   Potassium 10 mEq take 2 tablets daily   Labs: 01/30/2022 Creatinine 1.28, BUN 22, Potassium 4.5, Sodium 139, GFR 48 09/30/2021 Creatinine 1.32, BUN 28, Potassium 3.8, Sodium 135, GFR 46 09/27/2021 Creatinine 1.45, BUN 27, Potassium 4.2, Sodium 140, GFR 41  06/27/2021 Creatinine 1.37, BUN 32, Potassium 4.6, Sodium 139, GFR 44 03/08/2021 Creatinine 1.14, BUN 27, Potassium 4.1, Sodium 140, GFR 55 A complete set of results can be found in Results Review.   Recommendations:  No changes and encouraged to call if experiencing any fluid symptoms.    Follow-up plan: ICM clinic phone appointment on 07/07/2022.  91 day device clinic remote transmission 07/24/2022.     EP/Cardiology Office Visits:  Recall 06/24/2022 with Dr Mariah Milling.  Recall 09/26/2022 with Dr Graciela Husbands.   Copy of ICM check sent to Dr. Graciela Husbands.    3 month ICM trend: 06/02/2022.    12-14 Month ICM trend:     Karie Soda, RN 06/04/2022 12:06 PM

## 2022-06-05 ENCOUNTER — Ambulatory Visit: Payer: No Typology Code available for payment source | Admitting: Internal Medicine

## 2022-06-05 ENCOUNTER — Encounter: Payer: Self-pay | Admitting: Internal Medicine

## 2022-06-05 VITALS — BP 124/82 | HR 84 | Temp 97.9°F | Ht 65.5 in | Wt 159.0 lb

## 2022-06-05 DIAGNOSIS — G43E11 Chronic migraine with aura, intractable, with status migrainosus: Secondary | ICD-10-CM

## 2022-06-05 DIAGNOSIS — F418 Other specified anxiety disorders: Secondary | ICD-10-CM

## 2022-06-05 DIAGNOSIS — G4452 New daily persistent headache (NDPH): Secondary | ICD-10-CM

## 2022-06-05 DIAGNOSIS — M542 Cervicalgia: Secondary | ICD-10-CM

## 2022-06-05 DIAGNOSIS — G8929 Other chronic pain: Secondary | ICD-10-CM

## 2022-06-05 LAB — CBC WITH DIFFERENTIAL/PLATELET
Basophils Absolute: 0 10*3/uL (ref 0.0–0.1)
Basophils Relative: 0.7 % (ref 0.0–3.0)
Eosinophils Absolute: 0.1 10*3/uL (ref 0.0–0.7)
Eosinophils Relative: 1.6 % (ref 0.0–5.0)
HCT: 45.9 % (ref 36.0–46.0)
Hemoglobin: 15.4 g/dL — ABNORMAL HIGH (ref 12.0–15.0)
Lymphocytes Relative: 24.9 % (ref 12.0–46.0)
Lymphs Abs: 1.6 10*3/uL (ref 0.7–4.0)
MCHC: 33.5 g/dL (ref 30.0–36.0)
MCV: 94.9 fl (ref 78.0–100.0)
Monocytes Absolute: 0.5 10*3/uL (ref 0.1–1.0)
Monocytes Relative: 8.4 % (ref 3.0–12.0)
Neutro Abs: 4.1 10*3/uL (ref 1.4–7.7)
Neutrophils Relative %: 64.4 % (ref 43.0–77.0)
Platelets: 218 10*3/uL (ref 150.0–400.0)
RBC: 4.84 Mil/uL (ref 3.87–5.11)
RDW: 14.9 % (ref 11.5–15.5)
WBC: 6.4 10*3/uL (ref 4.0–10.5)

## 2022-06-05 LAB — SEDIMENTATION RATE: Sed Rate: 18 mm/hr (ref 0–30)

## 2022-06-05 LAB — C-REACTIVE PROTEIN: CRP: <1 mg/dL (ref 0.5–20.0)

## 2022-06-05 MED ORDER — BACLOFEN 10 MG PO TABS: 10.0000 mg | ORAL_TABLET | Freq: Three times a day (TID) | ORAL | 5 refills | Status: DC

## 2022-06-05 MED ORDER — DULOXETINE HCL 30 MG PO CPEP: 30.0000 mg | ORAL_CAPSULE | Freq: Every day | ORAL | 1 refills | Status: DC

## 2022-06-05 NOTE — Patient Instructions (Signed)
Resume cymbalta starting at 30 mg daily  Labs today to rule out vasculitis  I will talk to Dr Sherryll Burger  about your current condition

## 2022-06-05 NOTE — Progress Notes (Signed)
Subjective:  Patient ID: Suzanne Spears, female    DOB: Jul 30, 1960  Age: 62 y.o. MRN: [id number]  CC: The primary encounter diagnosis was New persistent daily headache. Diagnoses of Chronic neck pain, Depression with anxiety, and Intractable chronic migraine with aura with status migrainosus were also pertinent to this visit.   HPI Suzanne Spears presents for  Chief Complaint  Patient presents with   Headache    Headaches with Floaters Pain 5/10 Headaches on 06/03/22, 06/04/22 & 06/05/22   Suzanne Spears is a 62 yr old female with chronic migraine disorder, cervical spinal stenosis,  CTS, and nonischemic dilated cardiomyopathy s/p pacemaker who presents for evaluation and management of recurrent debilitating headaches that have been occurring daily for several months   Her migraine disorder is managed by neurologist Dr Sherryll Burger with Bennie Pierini daily,  monthly Amjovy (last dose 3 days ago) , but she also takes neurontin 100 mg qhs and effexor 150 mg daily.  She inadvertendly stopped Cymbalta after her last visit with Dr Sherryll Burger (he advised her to stop Effexor,  not Cymbalta , although she was tolerating both without signs of serotonin excess).   At her previous  visit Jan 2024 for CPE:   she reported that her Migraines, worsening, accompanied  by  facial numbness , bilateral upper extremity numbness and pain,  Her  Last ESI series for the cervical spine  had been done mid year.  CT head was done during ER visit in Nov 2023 noted nothing acute. She received steroid injection in right wrist. And continued to have  pain in thumb 1st and 2nd fingers, and following her neurology visit in December  she was diagnosed with CTS  by [facility] studies   She is very frustrated.  Having recurrent daily headaches back to back with floaters.  The pain and associated symptoms have made it very difficult to work, even from home.   Patient sent the following mychart message on March 26: I'm having migraines that are presenting  themselves differently. My vision is off when I wake up in the mornings and I am not able to see  clearly, the floaters take a long time to come on and clear off. I get very dizzy and must lay down to help get relief. I am not calling the office because I don't seem to get calls back for days. I am almost out of sick days because of them.  My FMLA paperwork only shows 6 months on it. You have always put an annual limit on it in the past. The migraines are interfering with my work/ daily life. What can we do to help stop these symptoms? At this time, I am really wanting to go on disability"   Current headache Regimen:  currently taking Qulipta every morning.mAjovy monthly injection  (last dose 3 days ago)  for the last several months (she is not sure why it was  changed by Sherryll Burger because  the former injectible was working better; she's not sure which one worked better  ) , neurontin 100 mg qhs and prn   she stopped cumbalta mistakenly and is taking effexor.    She wil feel the headache coming on,  will culminate in dizziness,  inability to concentrate, then  feels agitated for about  45 minutes followed by feeling  exhausted,  She has an appt with Dr Sherryll Burger on April 26    Outpatient Medications Prior to Visit  Medication Sig Dispense Refill   cetirizine (ZYRTEC) 10 MG  chewable tablet Chew 10 mg by mouth daily.     cyanocobalamin (VITAMIN B12) 1000 MCG/ML injection Inject 1 mL (1,000 mcg total) into the muscle every 30 (thirty) days. 10 mL 1   dapagliflozin propanediol (FARXIGA) 10 MG TABS tablet Take 1 tablet (10 mg total) by mouth daily. 90 tablet 1   Elderberry 500 MG CAPS Take 500 mg by mouth daily as needed.     ENTRESTO 24-26 MG Take 0.5 tablets by mouth 2 (two) times daily. 90 tablet 3   ezetimibe (ZETIA) 10 MG tablet Take 1 tablet (10 mg total) by mouth daily. 90 tablet 3   fluticasone (FLONASE) 50 MCG/ACT nasal spray USE 2 SPRAYS INTO BOTH NOSTRILS ONCE DAILY AS DIRECTED BY PHYSICIAN. 48 g 2    Fremanezumab-vfrm 225 MG/1.5ML SOAJ Inject into the skin.     furosemide (LASIX) 20 MG tablet Take 1 tablet (20 mg total) by mouth as directed. Take 1 tablet daily. Take extra 20 mg as needed. 180 tablet 3   gabapentin (NEURONTIN) 100 MG capsule Take 1 capsule (100 mg total) by mouth 3 (three) times daily. 180 capsule 1   Multiple Minerals-Vitamins (YUMVS CALC-MAG-ZINC-VIT D PO) Take by mouth.     potassium chloride (KLOR-CON) 10 MEQ tablet Take 2 tablets (20 mEq total) by mouth daily. 180 tablet 3   QULIPTA 60 MG TABS Take 60 mg by mouth daily at 2 am.     rosuvastatin (CRESTOR) 5 MG tablet Take 1 tablet (5 mg total) by mouth daily. 90 tablet 3   spironolactone (ALDACTONE) 25 MG tablet TAKE 1 TABLET(25 MG) BY MOUTH DAILY 90 tablet 1   Syringe/Needle, Disp, (SYRINGE 3CC/25GX1") 25G X 1" 3 ML MISC Use to inject 1 mL of vitamin B12 intramuscular every 30 days. 50 each 0   traMADol (ULTRAM) 50 MG tablet Take 50 mg by mouth every 6 (six) hours as needed.     valACYclovir (VALTREX) 1000 MG tablet Take 1 tablet (1,000 mg total) by mouth 2 (two) times daily as needed (fever blisters). 20 tablet 3   venlafaxine XR (EFFEXOR-XR) 150 MG 24 hr capsule TAKE 1 CAPSULE BY MOUTH EVERY DAY WITH BREAKFAST 90 capsule 1   baclofen (LIORESAL) 10 MG tablet Take 1 tablet (10 mg total) by mouth 2 (two) times daily. 60 each 1   traZODone (DESYREL) 50 MG tablet TAKE 1/2 TO 1 TABLET BY MOUTH AT BEDTIMEAS NEEDED FOR SLEEP 90 tablet 1   No facility-administered medications prior to visit.    Review of Systems;  Patient denies  fevers, malaise, unintentional weight loss, skin rash, eye pain, sinus congestion and sinus pain, sore throat, dysphagia,  hemoptysis , cough, dyspnea, wheezing, chest pain, palpitations, orthopnea, edema, abdominal pain, nausea, melena, diarrhea, constipation, flank pain, dysuria, hematuria, urinary  Frequency, nocturia, numbness, tingling, seizures,  Focal weakness, Loss of consciousness,  Tremor,  insomnia, depression, anxiety, and suicidal ideation.      Objective:  BP 124/82   Pulse 84   Temp 97.9 F (36.6 C)   Ht 5' 5.5" (1.664 m)   Wt 159 lb (72.1 kg)   LMP 07/12/2016   SpO2 99%   BMI 26.06 kg/m   BP Readings from Last 3 Encounters:  06/05/22 124/82  04/18/22 110/78  03/10/22 118/64    Wt Readings from Last 3 Encounters:  06/05/22 159 lb (72.1 kg)  04/18/22 158 lb 3.2 oz (71.8 kg)  03/10/22 160 lb 6.4 oz (72.8 kg)    Physical Exam Vitals reviewed.  Constitutional:      General: She is not in acute distress.    Appearance: Normal appearance. She is normal weight. She is not ill-appearing, toxic-appearing or diaphoretic.  HENT:     Head: Normocephalic.  Eyes:     General: No scleral icterus.       Right eye: No discharge.        Left eye: No discharge.     Conjunctiva/sclera: Conjunctivae normal.  Cardiovascular:     Rate and Rhythm: Normal rate and regular rhythm.     Heart sounds: Normal heart sounds.  Pulmonary:     Effort: Pulmonary effort is normal. No respiratory distress.     Breath sounds: Normal breath sounds.  Musculoskeletal:        General: Normal range of motion.  Skin:    General: Skin is warm and dry.  Neurological:     General: No focal deficit present.     Mental Status: She is alert and oriented to person, place, and time. Mental status is at baseline.  Psychiatric:        Mood and Affect: Mood is anxious.        Behavior: Behavior normal.        Thought Content: Thought content normal.        Judgment: Judgment normal.    Lab Results  Component Value Date   HGBA1C 6.0 03/10/2022   HGBA1C 5.6 03/08/2021    Lab Results  Component Value Date   CREATININE 1.28 (H) 01/30/2022   CREATININE 1.32 (H) 09/30/2021   CREATININE 1.45 (H) 09/27/2021    Lab Results  Component Value Date   WBC 6.4 06/05/2022   HGB 15.4 (H) 06/05/2022   HCT 45.9 06/05/2022   PLT 218.0 06/05/2022   GLUCOSE 84 01/30/2022   CHOL 138 01/30/2022    TRIG 35 01/30/2022   HDL 67 01/30/2022   LDLDIRECT 62 01/30/2022   LDLCALC 64 01/30/2022   ALT 18 01/30/2022   AST 25 01/30/2022   NA 139 01/30/2022   K 4.5 01/30/2022   CL 103 01/30/2022   CREATININE 1.28 (H) 01/30/2022   BUN 22 (H) 01/30/2022   CO2 26 01/30/2022   TSH 1.00 03/10/2022   INR 1.0 03/02/2020   HGBA1C 6.0 03/10/2022    No results found.  Assessment & Plan:  .New persistent daily headache -     Sedimentation rate -     CBC with Differential/Platelet -     C-reactive protein -     ANA  Chronic neck pain Assessment & Plan: Improved with ESI done last week for management of DDD   Depression with anxiety Assessment & Plan: Advised to resume cymbalta and continue low dose Effexor for now as all symptoms seem to have worsened since her regimen was changed    Intractable chronic migraine with aura with status migrainosus Assessment & Plan: Uncontrolled , now occurring daily .  Follow up with Dr Sherryll Burger next two weeks for medication adjustment as I am not comfortable changing her regimen  of preventives/abortives. In her current state I agree that she would be a good candidate for disability but I have recommended that she discuss with Dr Sherryll Burger and her cardiologist as well to see if they would support her request for disability   Other orders -     DULoxetine HCl; Take 1 capsule (30 mg total) by mouth daily.  Dispense: 90 capsule; Refill: 1 -     Baclofen; Take 1 tablet (10  mg total) by mouth 3 (three) times daily. As needed for muscle spasm  Dispense: 90 tablet; Refill: 5     I provided 40 minutes of face-to-face time during this encounter reviewing patient's last visit with me, patient's  most recent visit with cardiology, physiatry ,  and neurology,  recent surgical and non surgical procedures, previous  labs and imaging studies, counseling on currently addressed issues,  and post visit ordering to diagnostics and therapeutics .   Follow-up: No follow-ups on  file.   Sherlene Shams, MD

## 2022-06-06 LAB — ANA: Anti Nuclear Antibody (ANA): NEGATIVE

## 2022-06-06 NOTE — Assessment & Plan Note (Signed)
Advised to resume cymbalta and continue low dose Effexor for now as all symptoms seem to have worsened since her regimen was changed

## 2022-06-06 NOTE — Assessment & Plan Note (Addendum)
Uncontrolled , now occurring daily .  Follow up with Dr Sherryll Burger next two weeks for medication adjustment as I am not comfortable changing her regimen  of preventives/abortives. In her current state I agree that she would be a good candidate for disability but I have recommended that she discuss with Dr Sherryll Burger and her cardiologist as well to see if they would support her request for disability

## 2022-06-06 NOTE — Assessment & Plan Note (Signed)
Improved with ESI done last week for management of DDD

## 2022-06-09 ENCOUNTER — Encounter: Payer: Self-pay | Admitting: Internal Medicine

## 2022-06-17 ENCOUNTER — Encounter: Payer: Self-pay | Admitting: Internal Medicine

## 2022-06-18 ENCOUNTER — Telehealth: Payer: Self-pay | Admitting: *Deleted

## 2022-06-18 ENCOUNTER — Telehealth: Payer: Self-pay | Admitting: Internal Medicine

## 2022-06-18 ENCOUNTER — Encounter: Payer: Self-pay | Admitting: Internal Medicine

## 2022-06-18 NOTE — Telephone Encounter (Signed)
Pt has been scheduled for a tele visit, 06/23/21.  Consent on file / medications reconciled.

## 2022-06-18 NOTE — Telephone Encounter (Signed)
   Pre-operative Risk Assessment    Patient Name: Suzanne Spears  DOB: 02/20/1961 MRN: [id number]      Request for Surgical Clearance    Procedure:   RADIO FREQUENCY ABLATION OF CERVICAL NERVES BY STANDARD THERMAL ABLATION, MAGNET AVAILABLE   Date of Surgery:  Clearance TBD                                 Surgeon:  DR. Filomena Jungling Surgeon's Group or Practice Name:  [facility] Phone number:  [phone] Fax number:  [phone]   Type of Clearance Requested:   - Medical ; NO MEDICATIONS LISTED AS NEEDING TO BE HELD THOUGH LOOKS LIKE THEY WILL NEED DEVICE CLEARANCE. I WILL FORWARD TO DEVICE CLINIC AS WELL.    Type of Anesthesia:  Not Indicated   Additional requests/questions:    Elpidio Anis   06/18/2022, 10:29 AM

## 2022-06-18 NOTE — Telephone Encounter (Signed)
   Name: Suzanne Spears  DOB: 10/13/1960  MRN: [id number]  Primary Cardiologist: Julien Nordmann, MD   Preoperative team, please contact this patient and set up a phone call appointment for further preoperative risk assessment. Please obtain consent and complete medication review. Thank you for your help.  I confirm that guidance regarding antiplatelet and oral anticoagulation therapy has been completed and, if necessary, noted below (none requested).    Joylene Grapes, NP 06/18/2022, 12:21 PM [facility]

## 2022-06-18 NOTE — Progress Notes (Addendum)
PERIOPERATIVE PRESCRIPTION FOR IMPLANTED CARDIAC DEVICE PROGRAMMING  Patient Information: Name:  Suzanne Spears  DOB:  May 19, 1960  MRN:  [id number]    Procedure:   RADIO FREQUENCY ABLATION OF CERVICAL NERVES BY STANDARD THERMAL ABLATION, MAGNET AVAILABLE    Date of Surgery:  Clearance TBD                                 Surgeon:  DR. Filomena Jungling Surgeon's Group or Practice Name:  [facility] Phone number:  [phone] Fax number:  [phone]   Device Information:  Clinic EP Physician:  Sherryl Manges, MD   Device Type:  Defibrillator Manufacturer and Phone #:  [employer]tr[employer]: [id number] Pacemaker Dependent?:  No. Date of Last Device Check:  06/02/2022 Normal Device Function?:  Yes.    Electrophysiologist's Recommendations:  Have magnet available. Provide continuous ECG monitoring when magnet is used or reprogramming is to be performed.  Procedure may interfere with device function.  Magnet should be placed over device during procedure.  Per Device Clinic Standing Orders, Suzanne STONEBERG, RN  10:38 AM 06/18/2022

## 2022-06-18 NOTE — Telephone Encounter (Signed)
Pt has been scheduled for a tele visit, 06/24/22.  Consent on file / medications reconciled.    Patient Consent for Virtual Visit        Suzanne Spears has provided verbal consent on 06/18/2022 for a virtual visit (video or telephone).   CONSENT FOR VIRTUAL VISIT FOR:  Suzanne Spears  By participating in this virtual visit I agree to the following:  I hereby voluntarily request, consent and authorize [facility] and its employed or contracted physicians, physician assistants, nurse practitioners or other licensed health care professionals (the Practitioner), to provide me with telemedicine health care services (the "Services") as deemed necessary by the treating Practitioner. I acknowledge and consent to receive the Services by the Practitioner via telemedicine. I understand that the telemedicine visit will involve communicating with the Practitioner through live audiovisual communication technology and the disclosure of certain medical information by electronic transmission. I acknowledge that I have been given the opportunity to request an in-person assessment or other available alternative prior to the telemedicine visit and am voluntarily participating in the telemedicine visit.  I understand that I have the right to withhold or withdraw my consent to the use of telemedicine in the course of my care at any time, without affecting my right to future care or treatment, and that the Practitioner or I may terminate the telemedicine visit at any time. I understand that I have the right to inspect all information obtained and/or recorded in the course of the telemedicine visit and may receive copies of available information for a reasonable fee.  I understand that some of the potential risks of receiving the Services via telemedicine include:  Delay or interruption in medical evaluation due to technological equipment failure or disruption; Information transmitted may not be sufficient  (e.g. poor resolution of images) to allow for appropriate medical decision making by the Practitioner; and/or  In rare instances, security protocols could fail, causing a breach of personal health information.  Furthermore, I acknowledge that it is my responsibility to provide information about my medical history, conditions and care that is complete and accurate to the best of my ability. I acknowledge that Practitioner's advice, recommendations, and/or decision may be based on factors not within their control, such as incomplete or inaccurate data provided by me or distortions of diagnostic images or specimens that may result from electronic transmissions. I understand that the practice of medicine is not an exact science and that Practitioner makes no warranties or guarantees regarding treatment outcomes. I acknowledge that a copy of this consent can be made available to me via my patient portal [facility] MyChart), or I can request a printed copy by calling the office of [facility].    I understand that my insurance will be billed for this visit.   I have read or had this consent read to me. I understand the contents of this consent, which adequately explains the benefits and risks of the Services being provided via telemedicine.  I have been provided ample opportunity to ask questions regarding this consent and the Services and have had my questions answered to my satisfaction. I give my informed consent for the services to be provided through the use of telemedicine in my medical care

## 2022-06-18 NOTE — Telephone Encounter (Signed)
Marisue Ivan from [facility] called stating they will send [name] a Clearance Fax for this pt and wants [name] to keep an eye out of it.
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
## 2022-06-24 ENCOUNTER — Ambulatory Visit: Payer: No Typology Code available for payment source | Attending: Cardiovascular Disease

## 2022-06-24 DIAGNOSIS — Z01810 Encounter for preprocedural cardiovascular examination: Secondary | ICD-10-CM

## 2022-06-26 ENCOUNTER — Ambulatory Visit
Admission: RE | Admit: 2022-06-26 | Discharge: 2022-06-26 | Disposition: A | Discharge status: Home / Self Care | Payer: No Typology Code available for payment source | Source: Ambulatory Visit | Attending: Internal Medicine | Admitting: Internal Medicine

## 2022-06-26 DIAGNOSIS — Z1231 Encounter for screening mammogram for malignant neoplasm of breast: Secondary | ICD-10-CM | POA: Insufficient documentation

## 2022-06-27 ENCOUNTER — Encounter: Payer: Self-pay | Admitting: Internal Medicine

## 2022-07-07 ENCOUNTER — Ambulatory Visit: Payer: Self-pay | Attending: Internal Medicine

## 2022-07-07 DIAGNOSIS — I5032 Chronic diastolic (congestive) heart failure: Secondary | ICD-10-CM

## 2022-07-07 DIAGNOSIS — Z95810 Presence of automatic (implantable) cardiac defibrillator: Secondary | ICD-10-CM

## 2022-07-07 NOTE — Progress Notes (Unsigned)
EPIC Encounter for ICM Monitoring  Patient Name: Suzanne Spears is a 62 y.o. female Date: 07/07/2022 Primary Care Physican: Sherlene Shams, MD Primary Cardiologist: Mariah Milling Electrophysiologist: Joycelyn Schmid Pacing:  98.4%    06/04/2022 Weight: 159 lbs            Spoke with patient and heart failure questions reviewed.  Transmission results reviewed.  Pt is aware she has been having fluid accumulation and taking 2 Furosemide tablets for past couple of days.  She is going out of town and will recheck fluid levels on 5/27     Diet: Not following low salt diet   OptiVol Thoracic impedance suggesting possible fluid accumulation starting 5/7.    Prescribed:  Furosemide 20 mg Take1 tablet (20 mg) once daily. May take extra 20 mg as needed for weight gain or swelling.   Potassium 10 mEq take 2 tablets daily   Labs: 01/30/2022 Creatinine 1.28, BUN 22, Potassium 4.5, Sodium 139, GFR 48 09/30/2021 Creatinine 1.32, BUN 28, Potassium 3.8, Sodium 135, GFR 46 09/27/2021 Creatinine 1.45, BUN 27, Potassium 4.2, Sodium 140, GFR 41  06/27/2021 Creatinine 1.37, BUN 32, Potassium 4.6, Sodium 139, GFR 44 03/08/2021 Creatinine 1.14, BUN 27, Potassium 4.1, Sodium 140, GFR 55 A complete set of results can be found in Results Review.   Recommendations:  Advised to take Lasix 40 mg x 3 days if possible during her traveling and return to 1 tablet daily.     Follow-up plan: ICM clinic phone appointment on 07/21/2022 (manual) to recheck fluid levels.  91 day device clinic remote transmission 07/24/2022.     EP/Cardiology Office Visits:  Recall 06/24/2022 with Dr Mariah Milling.  Recall 09/26/2022 with Dr Graciela Husbands.   Copy of ICM check sent to Dr. Graciela Husbands.    3 month ICM trend: 07/07/2022.    12-14 Month ICM trend:     Karie Soda, RN 07/07/2022 9:03 AM

## 2022-07-10 ENCOUNTER — Other Ambulatory Visit: Payer: Self-pay | Admitting: Cardiovascular Disease

## 2022-07-10 ENCOUNTER — Telehealth: Payer: Self-pay | Admitting: Cardiovascular Disease

## 2022-07-10 NOTE — Telephone Encounter (Signed)
Please contact pt for future appointment. ?Pt due for 12 month f/u. ?Pt needing refills. ?

## 2022-07-10 NOTE — Telephone Encounter (Signed)
Pt c/o medication issue:  1. Name of Medication:   ENTRESTO 24-26 MG  rosuvastatin (CRESTOR) 5 MG tablet   2. How are you currently taking this medication (dosage and times per day)?  As written   3. Are you having a reaction (difficulty breathing--STAT)? no  4. What is your medication issue? Pt called in asking "is it imperative that I take this medication because I left these meds at home, I'm in [place] and insurance won't pay for it, I will have to pay out of pocket." please advise

## 2022-07-10 NOTE — Telephone Encounter (Signed)
I spoke with Dr. Mariah Milling to find out how long he would be comfortable with the patient being off Entresto. Per Dr. Mariah Milling, if the patient can resume these by Monday 07/14/22 she should be ok.  I have spoken with the patient and advised her that being off Crestor short term will not affect her numbers, but the Sherryll Burger was more concerning so I did reach out to Dr. Mariah Milling. I have notified the patient of Dr. Ethelene Hal' recommendations to resume by Monday. The patient advised she will be back in town on Sunday night so she will be able to resume these medications by Monday for sure.   The patient voices understanding of the above and is agreeable. She was very appreciative of the call back.

## 2022-07-11 NOTE — Telephone Encounter (Signed)
Pt scheduled on 5/31 

## 2022-07-22 ENCOUNTER — Ambulatory Visit: Payer: 59 | Attending: Internal Medicine

## 2022-07-22 DIAGNOSIS — I5032 Chronic diastolic (congestive) heart failure: Secondary | ICD-10-CM

## 2022-07-22 DIAGNOSIS — Z95810 Presence of automatic (implantable) cardiac defibrillator: Secondary | ICD-10-CM

## 2022-07-23 ENCOUNTER — Telehealth: Payer: Self-pay

## 2022-07-23 NOTE — Telephone Encounter (Signed)
Remote ICM transmission received.  Attempted call to patient regarding ICM remote transmission and left detailed message per DPR to return call.  Advised to return call for any fluid symptoms or questions.      

## 2022-07-23 NOTE — Progress Notes (Signed)
EPIC Encounter for ICM Monitoring  Patient Name: Suzanne Spears is a 62 y.o. female Date: 07/23/2022 Primary Care Physican: Sherlene Shams, MD Primary Cardiologist: Mariah Milling Electrophysiologist: Joycelyn Schmid Pacing:  98.4%    06/04/2022 Weight: 159 lbs            Attempted call to patient and unable to reach.  Left detailed message per DPR regarding transmission. Transmission reviewed.    Diet: Not following low salt diet   OptiVol Thoracic impedance suggesting fluid levels returned to normal after recommending to take 40 mg lasix x 3 days.    Prescribed:  Furosemide 20 mg Take1 tablet (20 mg) once daily. May take extra 20 mg as needed for weight gain or swelling.   Potassium 10 mEq take 2 tablets daily   Labs: 01/30/2022 Creatinine 1.28, BUN 22, Potassium 4.5, Sodium 139, GFR 48 09/30/2021 Creatinine 1.32, BUN 28, Potassium 3.8, Sodium 135, GFR 46 09/27/2021 Creatinine 1.45, BUN 27, Potassium 4.2, Sodium 140, GFR 41  06/27/2021 Creatinine 1.37, BUN 32, Potassium 4.6, Sodium 139, GFR 44 03/08/2021 Creatinine 1.14, BUN 27, Potassium 4.1, Sodium 140, GFR 55 A complete set of results can be found in Results Review.   Recommendations:  Left voice mail with ICM number and encouraged to call if experiencing any fluid symptoms.    Follow-up plan: ICM clinic phone appointment on 08/11/2022.  91 day device clinic remote transmission 10/23/2022.     EP/Cardiology Office Visits:  07/25/2022 with Ward Givens, NP.  Recall 09/26/2022 with Dr Graciela Husbands.   Copy of ICM check sent to Dr. Graciela Husbands.     3 month ICM trend: 07/21/2022.    12-14 Month ICM trend:     Karie Soda, RN 07/23/2022 12:01 PM

## 2022-07-24 ENCOUNTER — Ambulatory Visit (INDEPENDENT_AMBULATORY_CARE_PROVIDER_SITE_OTHER): Payer: 59

## 2022-07-24 DIAGNOSIS — I428 Other cardiomyopathies: Secondary | ICD-10-CM | POA: Diagnosis not present

## 2022-07-24 LAB — CUP PACEART REMOTE DEVICE CHECK
Battery Remaining Longevity: 84 mo
Battery Voltage: 3.01 V
Brady Statistic AP VP Percent: 2.52 %
Brady Statistic AP VS Percent: 0.11 %
Brady Statistic AS VP Percent: 96.11 %
Brady Statistic AS VS Percent: 1.27 %
Brady Statistic RA Percent Paced: 2.62 %
Brady Statistic RV Percent Paced: 2.66 %
Date Time Interrogation Session: 20240530033424
HighPow Impedance: 77 Ohm
Implantable Lead Connection Status: 753985
Implantable Lead Connection Status: 753985
Implantable Lead Connection Status: 753985
Implantable Lead Connection Status: 753985
Implantable Lead Implant Date: 20160421
Implantable Lead Implant Date: 20160421
Implantable Lead Implant Date: 20161006
Implantable Lead Implant Date: 20161006
Implantable Lead Location: 753858
Implantable Lead Location: 753858
Implantable Lead Location: 753859
Implantable Lead Location: 753860
Implantable Lead Model: 5071
Implantable Lead Model: 5071
Implantable Lead Model: 5076
Implantable Pulse Generator Implant Date: 20230831
Lead Channel Impedance Value: 304 Ohm
Lead Channel Impedance Value: 380 Ohm
Lead Channel Impedance Value: 399 Ohm
Lead Channel Impedance Value: 4047 Ohm
Lead Channel Impedance Value: 4047 Ohm
Lead Channel Impedance Value: 437 Ohm
Lead Channel Pacing Threshold Amplitude: 0.375 V
Lead Channel Pacing Threshold Amplitude: 1.25 V
Lead Channel Pacing Threshold Pulse Width: 0.4 ms
Lead Channel Pacing Threshold Pulse Width: 0.6 ms
Lead Channel Sensing Intrinsic Amplitude: 19.875 mV
Lead Channel Sensing Intrinsic Amplitude: 19.875 mV
Lead Channel Sensing Intrinsic Amplitude: 4.625 mV
Lead Channel Sensing Intrinsic Amplitude: 4.625 mV
Lead Channel Setting Pacing Amplitude: 1 V
Lead Channel Setting Pacing Amplitude: 1.5 V
Lead Channel Setting Pacing Amplitude: 1.75 V
Lead Channel Setting Pacing Pulse Width: 0.4 ms
Lead Channel Setting Pacing Pulse Width: 0.6 ms
Lead Channel Setting Sensing Sensitivity: 0.3 mV
Zone Setting Status: 755011
Zone Setting Status: 755011

## 2022-07-25 ENCOUNTER — Ambulatory Visit: Payer: 59 | Attending: Nurse Practitioner | Admitting: Nurse Practitioner

## 2022-07-25 ENCOUNTER — Telehealth: Payer: Self-pay | Admitting: Nurse Practitioner

## 2022-07-25 ENCOUNTER — Other Ambulatory Visit
Admission: RE | Admit: 2022-07-25 | Discharge: 2022-07-25 | Disposition: A | Discharge status: Home / Self Care | Payer: 59 | Source: Ambulatory Visit | Attending: Nurse Practitioner | Admitting: Nurse Practitioner

## 2022-07-25 ENCOUNTER — Encounter: Payer: Self-pay | Admitting: Nurse Practitioner

## 2022-07-25 VITALS — BP 100/70 | HR 83 | Ht 65.5 in | Wt 165.3 lb

## 2022-07-25 DIAGNOSIS — I420 Dilated cardiomyopathy: Secondary | ICD-10-CM

## 2022-07-25 DIAGNOSIS — R42 Dizziness and giddiness: Secondary | ICD-10-CM

## 2022-07-25 DIAGNOSIS — Z79899 Other long term (current) drug therapy: Secondary | ICD-10-CM

## 2022-07-25 DIAGNOSIS — I5022 Chronic systolic (congestive) heart failure: Secondary | ICD-10-CM

## 2022-07-25 DIAGNOSIS — I10 Essential (primary) hypertension: Secondary | ICD-10-CM | POA: Diagnosis not present

## 2022-07-25 LAB — BASIC METABOLIC PANEL
Anion gap: 6 (ref 5–15)
BUN: 20 mg/dL (ref 8–23)
CO2: 28 mmol/L (ref 22–32)
Calcium: 9.5 mg/dL (ref 8.9–10.3)
Chloride: 106 mmol/L (ref 98–111)
Creatinine, Ser: 1.25 mg/dL — ABNORMAL HIGH (ref 0.44–1.00)
GFR, Estimated: 49 mL/min — ABNORMAL LOW (ref >60)
Glucose, Bld: 96 mg/dL (ref 70–99)
Potassium: 4.7 mmol/L (ref 3.5–5.1)
Sodium: 140 mmol/L (ref 135–145)

## 2022-07-25 MED ORDER — DAPAGLIFLOZIN PROPANEDIOL 10 MG PO TABS: ORAL_TABLET | ORAL | 3 refills | Status: DC

## 2022-07-25 MED ORDER — ENTRESTO 24-26 MG PO TABS: 0.5000 | ORAL_TABLET | Freq: Two times a day (BID) | ORAL | 3 refills | Status: DC

## 2022-07-25 NOTE — Patient Instructions (Signed)
Medication Instructions:  No changes *If you need a refill on your cardiac medications before your next appointment, please call your pharmacy*   Lab Work: Your provider would like for you to have following labs drawn: BMET.   Please go to the [facility] entrance and check in at the front desk.  You do not need an appointment.  They are open from 7am-6 pm.   If you have labs (blood work) drawn today and your tests are completely normal, you will receive your results only by: MyChart Message (if you have MyChart) OR A paper copy in the mail If you have any lab test that is abnormal or we need to change your treatment, we will call you to review the results.   Testing/Procedures: None ordered   Follow-Up: At [facility], you and your health needs are our priority.  As part of our continuing mission to provide you with exceptional heart care, we have created designated Provider Care Teams.  These Care Teams include your primary Cardiologist (physician) and Advanced Practice Providers (APPs -  Physician Assistants and Nurse Practitioners) who all work together to provide you with the care you need, when you need it.  We recommend signing up for the patient portal called "MyChart".  Sign up information is provided on this After Visit Summary.  MyChart is used to connect with patients for Virtual Visits (Telemedicine).  Patients are able to view lab/test results, encounter notes, upcoming appointments, etc.  Non-urgent messages can be sent to your provider as well.   To learn more about what you can do with MyChart, go to [email or web address].    Your next appointment:   6 month(s)  Provider:   You may see Dr. Graciela Husbands or one of the following Advanced Practice Providers on your designated Care Team:   Nicolasa Ducking, NP Eula Listen, PA-C Cadence Fransico Michael, PA-C Charlsie Quest, NP
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
## 2022-07-25 NOTE — Telephone Encounter (Signed)
Patient returned call for her lab results. °

## 2022-07-25 NOTE — Telephone Encounter (Signed)
Spoke with patient and reviewed provider results and recommendations. She verbalized understanding with no further questions at this time.    Creig Hines, NP  Sent: Caleen Essex Jul 25, 2022 12:29 PM  To: Sandi Mariscal, RN   Result Note  Potassium is 4.7, which is normal.  She is currently taking potassium 20 mEq daily and asked if it might be possible to stop this.  I suspect potassium would remain normal even if she was not taking supplementation at this time.  If she would like to come off of potassium she may, but she should have a follow-up basic metabolic panel in about 2 weeks to ensure that potassium has remained normal.  Her kidney function is stable with a creatinine of 1.25.

## 2022-07-29 ENCOUNTER — Ambulatory Visit
Payer: 59 | Attending: Student in an Organized Health Care Education/Training Program | Admitting: Student in an Organized Health Care Education/Training Program

## 2022-07-29 ENCOUNTER — Encounter: Payer: Self-pay | Admitting: Student in an Organized Health Care Education/Training Program

## 2022-07-29 VITALS — BP 114/68 | HR 94 | Temp 97.9°F | Ht 66.0 in | Wt 165.0 lb

## 2022-07-29 DIAGNOSIS — G894 Chronic pain syndrome: Secondary | ICD-10-CM | POA: Diagnosis present

## 2022-07-29 DIAGNOSIS — M5412 Radiculopathy, cervical region: Secondary | ICD-10-CM

## 2022-07-29 DIAGNOSIS — M47812 Spondylosis without myelopathy or radiculopathy, cervical region: Secondary | ICD-10-CM | POA: Diagnosis present

## 2022-07-29 NOTE — Progress Notes (Signed)
Patient: Suzanne Spears  Service Category: E/M  Provider: Edward Jolly, MD  DOB: [birth year]  DOS: 07/29/2022  Referring Provider: Elijah Birk, MD  MRN: [id number]  Setting: Ambulatory outpatient  PCP: Sherlene Shams, MD  Type: New Patient  Specialty: Interventional Pain Management    Location: Office  Delivery: Face-to-face     Primary Reason(s) for Visit: Encounter for initial evaluation of one or more chronic problems (new to examiner) potentially causing chronic pain, and posing a threat to normal musculoskeletal function. (Level of risk: High) CC: Neck Pain (lower)  HPI  Suzanne Spears is a 62 y.o. year old, female patient, who comes for the first time to our practice referred by Filomena Jungling I, MD for our initial evaluation of her chronic pain. She has Asthma, chronic; Left bundle branch block; Mitral valve prolapse syndrome; Fatigue; Cardiomyopathy, dilated, nonischemic (HCC); B12 deficiency; Visit for preventive health examination; Dyspareunia, female; Unspecified hereditary and idiopathic peripheral neuropathy; Edema; Amaurosis fugax; Depression with anxiety; Insomnia; Chronic systolic heart failure (HCC); S/P ICD (internal cardiac defibrillator) procedure; Status post thoracotomy; Encounter for therapeutic drug monitoring; Carpal tunnel syndrome; Chronic diastolic CHF (congestive heart failure) (HCC); Ocular migraine; Habitual snoring; GERD (gastroesophageal reflux disease); Polyarthritis of multiple sites; Heel pain, bilateral; Concussion with no loss of consciousness, subsequent encounter; Chronic kidney disease, stage II (mild); History of 2019 novel coronavirus disease (COVID-19); Myalgia due to statin; Encounter for preventive health examination; Cervical radicular pain; Abdominal bloating with cramps; Colon cancer screening; Polyp of ascending colon; Cervical spondylosis; Cervico-occipital neuralgia; Chronic neck pain; Coronary arteriosclerosis; Essential (primary) hypertension;  Headache, post-myelogram; Hyperlipidemia, mixed; Other dysphagia; Presence of cardiac pacemaker; Proteinuria; Right subscapular pain; Migraine; and Chronic pain syndrome on their problem list. Today she comes in for evaluation of her Neck Pain (lower)  Pain Assessment: Location: Right, Left Neck Radiating: pain radiaities down her neck to her shoulder blade Onset: More than a month ago Duration: Chronic pain Quality: Burning, Aching, Shooting, Sharp, Nagging Severity: 8 /10 (subjective, self-reported pain score)  Effect on ADL: limits my daikly activities Timing: Constant Modifying factors: Meds, TENS, heat BP: 114/68  HR: 94  Onset and Duration: Present longer than 3 months Cause of pain: Unknown Severity: Getting worse, NAS-11 at its worse: 8/10, NAS-11 at its best: 5/10, NAS-11 now: 7/10, and NAS-11 on the average: 8/10 Timing: Not influenced by the time of the day Aggravating Factors:  it hurst all the time no matter what I do. Alleviating Factors: Nerve blocks Associated Problems: Tingling Quality of Pain: Burning, Nagging, Shooting, and Tingling Previous Examinations or Tests: Nerve block Previous Treatments: Epidural steroid injections, TENS, and Trigger point injections  Suzanne Spears is being evaluated for possible interventional pain management therapies for the treatment of her chronic pain.   Patient is a pleasant 62 year old female who presents with a chief complaint of right-sided neck pain, right-sided headaches and radiating right shoulder and arm pain.  She has had multiple right cervical transforaminal epidural steroid injections with physical medicine and rehab with some improvement.  She has a history of migraines which is being managed by neurology.  She is on migraine medications and has also tried a migraine infusion.  She is being referred from Dr. Mariah Milling to consider a right C2, C3, C4 medial branch nerve block and subsequent radiofrequency ablation if the diagnostic  nerve block is helpful.  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  She states that work can be challenging especially since she is on a computer most  of the day.  She has done physical therapy in the past.  Unfortunately, she has a history of's congestive heart failure and has a AICD and pacemaker in place.  Right hand numbness potentially carpal tunnel syndrome Cervical radiculitis -s/p right C4-5 TFESI 04/15/2021 with 60% improvement -s/p right C3-4 TF ESI 05/13/2021 with 90% improvement -s/p right C4-5 TFESI 04/10/2022 with 50% improvement -s/p right C3-4 TFESI 05/30/2022 with 75% relief   Meds   Current Outpatient Medications:    baclofen (LIORESAL) 10 MG tablet, Take 1 tablet (10 mg total) by mouth 3 (three) times daily. As needed for muscle spasm, Disp: 90 tablet, Rfl: 5   cyanocobalamin (VITAMIN B12) 1000 MCG/ML injection, Inject 1 mL (1,000 mcg total) into the muscle every 30 (thirty) days., Disp: 10 mL, Rfl: 1   dapagliflozin propanediol (FARXIGA) 10 MG TABS tablet, TAKE 1 TABLET(10 MG) BY MOUTH DAILY, Disp: 90 tablet, Rfl: 3   DULoxetine (CYMBALTA) 30 MG capsule, Take 1 capsule (30 mg total) by mouth daily., Disp: 90 capsule, Rfl: 1   Elderberry 500 MG CAPS, Take 500 mg by mouth daily as needed., Disp: , Rfl:    ENTRESTO 24-26 MG, Take 0.5 tablets by mouth 2 (two) times daily., Disp: 90 tablet, Rfl: 3   ezetimibe (ZETIA) 10 MG tablet, Take 1 tablet (10 mg total) by mouth daily., Disp: 90 tablet, Rfl: 3   fluticasone (FLONASE) 50 MCG/ACT nasal spray, USE 2 SPRAYS INTO BOTH NOSTRILS ONCE DAILY AS DIRECTED BY PHYSICIAN., Disp: 48 g, Rfl: 2   furosemide (LASIX) 20 MG tablet, Take 1 tablet (20 mg total) by mouth as directed. Take 1 tablet daily. Take extra 20 mg as needed., Disp: 180 tablet, Rfl: 3   gabapentin (NEURONTIN) 100 MG capsule, Take 1 capsule (100 mg total) by mouth 3 (three) times daily., Disp: 180 capsule, Rfl: 1   Multiple Minerals-Vitamins (YUMVS CALC-MAG-ZINC-VIT D PO),  Take by mouth., Disp: , Rfl:    rosuvastatin (CRESTOR) 5 MG tablet, Take 1 tablet (5 mg total) by mouth daily., Disp: 90 tablet, Rfl: 3   spironolactone (ALDACTONE) 25 MG tablet, TAKE 1 TABLET(25 MG) BY MOUTH DAILY, Disp: 90 tablet, Rfl: 1   traMADol (ULTRAM) 50 MG tablet, Take 50 mg by mouth every 6 (six) hours as needed., Disp: , Rfl:    UBRELVY 100 MG TABS, Take 1 tablet by mouth daily., Disp: , Rfl:    valACYclovir (VALTREX) 1000 MG tablet, Take 1 tablet (1,000 mg total) by mouth 2 (two) times daily as needed (fever blisters)., Disp: 20 tablet, Rfl: 3   venlafaxine XR (EFFEXOR-XR) 150 MG 24 hr capsule, TAKE 1 CAPSULE BY MOUTH EVERY DAY WITH BREAKFAST, Disp: 90 capsule, Rfl: 1   cetirizine (ZYRTEC) 10 MG chewable tablet, Chew 10 mg by mouth daily. (Patient not taking: Reported on 07/29/2022), Disp: , Rfl:    Fremanezumab-vfrm 225 MG/1.5ML SOAJ, Inject into the skin. (Patient not taking: Reported on 07/25/2022), Disp: , Rfl:    Syringe/Needle, Disp, (SYRINGE 3CC/25GX1") 25G X 1" 3 ML MISC, Use to inject 1 mL of vitamin B12 intramuscular every 30 days. (Patient not taking: Reported on 07/25/2022), Disp: 50 each, Rfl: 0  Imaging Review    Narrative CLINICAL DATA:  Hx of c-spine surg 5 yrs ago. Pt c/o numbness in right arm since surg, but new burning sensation that started 1 month ago . NKI  EXAM: CT CERVICAL SPINE WITHOUT CONTRAST  TECHNIQUE: Multidetector CT imaging of the cervical spine was performed without intravenous contrast.  Multiplanar CT image reconstructions were also generated.  RADIATION DOSE REDUCTION: This exam was performed according to the departmental dose-optimization program which includes automated exposure control, adjustment of the mA and/or kV according to patient size and/or use of iterative reconstruction technique.  COMPARISON:  None.  FINDINGS: Alignment: Normal.  Skull base and vertebrae: C5-C7 anterior cervical discectomy and fusion. Mild to moderate  anterior osteophyte formation along the C3-C4 C4-C5 levels. Mild C1-C2 degenerative changes. No severe osseous neural foraminal or central canal stenosis. No acute fracture. No aggressive appearing focal osseous lesion or focal pathologic process.  Soft tissues and spinal canal: No prevertebral fluid or swelling. No visible canal hematoma.  Upper chest: Trace biapical pleural/pulmonary scarring.  Other: None.  IMPRESSION: 1. No acute displaced fracture or traumatic listhesis of the cervical spine. 2. C5-C7 anterior cervical discectomy and fusion. 3. Mild to moderate degenerative changes of the spine with no severe osseous neural foraminal or central canal stenosis.   Electronically Signed By: Tish Frederickson M.D. On: 03/14/2021 20:44   Narrative CLINICAL DATA:  Neck pain  EXAM: CT CERVICAL SPINE WITH CONTRAST  TECHNIQUE: Multidetector CT imaging of the cervical spine was performed after intrathecal contrast administration. Multiplanar CT image reconstructions were also generated.  CONTRAST:  See report from myelogram injection.  COMPARISON:  03/05/2018  FINDINGS: Alignment: Normal  Skull base and vertebrae: Previous ACDF C5 through C7. Fusion appears solid. No hardware complication.  Soft tissues and spinal canal: Normal  Disc levels: Foramen magnum is widely patent. No abnormality at C1-2 or C2-3.  C3-4: Minimal disc bulge. No compressive narrowing of the canal or foramina.  C4-5: Minimal uncovertebral prominence on the left. No canal or foraminal stenosis.  C5 through C7: Good appearance following ACDF. Solid union. Wide patency of the canal and foramina.  C7-T1: No disc pathology. Facet osteoarthritis on the left which could be painful.  T1-2 and T2-3: Normal.  Upper chest: Negative  Other: None  IMPRESSION: Good appearance in the fusion segment from C5 through C7. Solid union with wide patency of the canal and foramina.  Facet  osteoarthritis on the left at C7-T1 which could be painful. No neural compression.  Minimal non-compressive spondylosis at C3-4 and C4-5.   Electronically Signed By: Paulina Fusi M.D. On: 11/11/2018 14:54  Cervical DG Myelogram views: Results for orders placed during the hospital encounter of 11/11/18  DG Myelogram Cervical  Narrative CLINICAL DATA:  Myelopathy.  FLUOROSCOPY TIME:  5 minutes 6 seconds  PROCEDURE: LUMBAR PUNCTURE FOR CERVICAL MYELOGRAM  After thorough discussion of risks and benefits of the procedure including bleeding, infection, injury to nerves, blood vessels, adjacent structures as well as headache and CSF leak, written and oral informed consent was obtained. Consent was obtained by Dr. Maisie Fus Register. We discussed the high likelihood of obtaining a diagnostic study.  Patient was positioned prone on the fluoroscopy table. Local anesthesia was provided with 1% lidocaine without epinephrine after prepped and draped in the usual sterile fashion. Puncture was performed at LEVEL using a 3 1/2 inch 22-gauge spinal needle left paramedian approach. Using a single pass through the dura, the needle was placed within the thecal sac, with return of clear CSF. 10 mL of Omnipaque 300 was injected into the thecal sac, with normal opacification of the nerve roots and cauda equina consistent with free flow within the subarachnoid space. The patient was then moved to the trendelenburg position and contrast flowed into the Cervical spine region.  I personally performed the lumbar  puncture and administered the intrathecal contrast. I also personally supervised acquisition of the myelogram images.  TECHNIQUE: Contiguous axial images were obtained through the Cervical spine after the intrathecal infusion of infusion. Coronal and sagittal reconstructions were obtained of the axial image sets.  FINDINGS: CERVICAL MYELOGRAM FINDINGS:  Contrast flowed into the cervical  canal easily. Reference is made to CT report for findings.  CT CERVICAL MYELOGRAM FINDINGS:  Reference is made to CT report for further findings.  IMPRESSION: Successful lumbar puncture for cervical myelogram. Reference is made to postmyelogram CT report for further findings.   Electronically Signed By: Maisie Fus  Register On: 11/11/2018 09:20   DG MYELOGRAPHY LUMBAR INJ CERVICAL  Narrative CLINICAL DATA:  Cervical spondylosis. Neck pain. Previous cervical fusion, now with RIGHT arm pain.  FLUOROSCOPY TIME:  42 seconds corresponding to a Dose Area Product of 71.3 Gy*m2  PROCEDURE: LUMBAR PUNCTURE FOR CERVICAL MYELOGRAM  After thorough discussion of risks and benefits of the procedure including bleeding, infection, injury to nerves, blood vessels, adjacent structures as well as headache and CSF leak, written and oral informed consent was obtained. Consent was obtained by Dr. Davonna Belling. We discussed the high likelihood of obtaining a diagnostic study.  Patient was positioned prone on the fluoroscopy table. Local anesthesia was provided with 1% lidocaine without epinephrine after prepped and draped in the usual sterile fashion. Puncture was performed at L3-L4 using a 3 1/2 inch 22-gauge spinal needle via midline approach. Using a single pass through the dura, the needle was placed within the thecal sac, with return of clear CSF. 10 mL of Isovue-M 300 was injected into the thecal sac, with normal opacification of the nerve roots and cauda equina consistent with free flow within the subarachnoid space. The patient was then moved to the trendelenburg position and contrast flowed into the Cervical spine region.  I personally performed the lumbar puncture and administered the intrathecal contrast. I also personally supervised acquisition of the myelogram images.  TECHNIQUE: Contiguous axial images were obtained through the Cervical spine after the intrathecal infusion of  infusion. Coronal and sagittal reconstructions were obtained of the axial image sets.  FINDINGS: CERVICAL MYELOGRAM FINDINGS:  Good opacification of the cervical subarachnoid space. Previous C5 through C7 arthrodesis. Anatomic alignment except for trace anterolisthesis C2-C3. No residual stenosis at the fusion site. Shallow ventral defects C3-C4. No nerve root cut off is evident.  Flexion extension upright views demonstrate no dynamic instability.  CT CERVICAL MYELOGRAM FINDINGS:  Alignment: Straightening of the normal cervical lordosis. Trace anterolisthesis C2-3 is facet mediated.  Vertebrae: No worrisome osseous lesion.  Solid arthrodesis C5-C7.  Cord: No cord compression.  Posterior Fossa: No tonsillar herniation.  Vertebral Arteries: Not assessed.  Paraspinal tissues: Unremarkable.  Clear lung apices.  Pacemaker.  Disc levels:  The individual disc spaces were examined as follows:  C2-3:  Trace anterolisthesis is facet mediated.  No impingement.  C3-4:  Annular bulge.  No impingement.  C4-5:  Normal.  C5-6:  Solid fusion.  No impingement.  C6-7:  Solid fusion.  No impingement.  C7-T1:  Normal disc space.  Mild facet arthropathy.  No impingement.  IMPRESSION: Solid C5-C7 fusion.  No residual impingement.  Minor annular bulging at C3-4, without frank disc protrusion or significant RIGHT-sided neural impingement.   Electronically Signed By: Elsie Stain M.D. On: 10/19/2015 12:40  CT LUMBAR SPINE WO CONTRAST  Narrative CLINICAL DATA:  Mid back pain radiating down the right leg  EXAM: CT LUMBAR SPINE WITHOUT CONTRAST  TECHNIQUE: Multidetector CT imaging of the lumbar spine was performed without intravenous contrast administration. Multiplanar CT image reconstructions were also generated.  COMPARISON:  None.  FINDINGS: Segmentation: 5 lumbar type vertebral bodies  Alignment: Normal  Vertebrae: No acute fracture or focal pathologic  process.  Paraspinal and other soft tissues: Tiny left renal calculus.  Disc levels: Up to mild disc narrowing and with bulging greatest at L4-5. No facet spurring. The canal and foramina appears widely patent.  IMPRESSION: No acute finding or impingement to explain left leg symptoms.   Electronically Signed By: Marnee Spring M.D. On: 04/16/2018 10:01   CT LUMBAR SPINE W CONTRAST  Narrative CLINICAL DATA:  Low back pain  EXAM: CT LUMBAR SPINE WITHOUT CONTRAST  TECHNIQUE: Multidetector CT imaging of the lumbar spine was performed without intravenous contrast administration. Multiplanar CT image reconstructions were also generated. Intrathecal contrast had been previously injected at myelography.  COMPARISON:  04/16/2018  FINDINGS: Segmentation: 5 lumbar type vertebral bodies show normal alignment.  Alignment: Normal  Vertebrae: No fracture or primary bone lesion.  Paraspinal and other soft tissues: Normal  Disc levels: T12-L1 and L1-2 show minimal endplate Schmorl's nodes. No disc bulge or herniation. No stenosis. Conus tip at L1-2.  L2-3: Normal interspace.  L3-4: Normal interspace.  L4-5: No disc abnormality.  Very minimal facet osteoarthritis.  L5-S1: No disc abnormality. Mild facet osteoarthritis. No stenosis.  Upper sacroiliac joints appear normal.  IMPRESSION: No advanced finding. Mild lower lumbar facet osteoarthritis, more at L5-S1 than L4-5. No significant disc pathology. No stenosis of the canal or foramina.   Electronically Signed By: Paulina Fusi M.D. On: 11/11/2018 14:41  DG Myelogram Lumbar  Narrative CLINICAL DATA:  Myelopathy.  EXAM: LUMBAR MYELOGRAM  FLUOROSCOPY TIME:  dictate in minutes and seconds  PROCEDURE: After thorough discussion of risks and benefits of the procedure including bleeding, infection, injury to nerves, blood vessels, adjacent structures as well as headache and CSF leak, written and oral informed  consent was obtained. Consent was obtained by Dr. Maisie Fus Register. Time out form was completed.  Patient was positioned prone on the fluoroscopy table. Local anesthesia was provided with 1% lidocaine without epinephrine after prepped and draped in the usual sterile fashion. Puncture was performed at LEVEL using a 3 1/2 inch 22-gauge spinal needle via left __Paramedian) approach. Using a single pass through the dura, the needle was placed within the thecal sac, with return of clear CSF. 15 mL of Isovue M-200 was injected into the thecal sac, with normal opacification of the nerve roots and cauda equina consistent with free flow within the subarachnoid space.  I personally performed the lumbar puncture and administered the intrathecal contrast. I also personally supervised acquisition of the myelogram images.  TECHNIQUE: Contiguous axial images were obtained through the Lumbar spine after the intrathecal infusion of infusion. Coronal and sagittal reconstructions were obtained of the axial image sets.  COMPARISON:  None  FINDINGS: LUMBAR MYELOGRAM FINDINGS:  Lumbar spinal canal filled well with contrast. Reference is made to postmyelogram CT for further discussion.  CT LUMBAR MYELOGRAM FINDINGS:  Reference is made to postmyelogram CT for further discussion.  IMPRESSION: LUMBAR MYELOGRAM IMPRESSION:  Successful lumbar myelogram.  CT LUMBAR MYELOGRAM IMPRESSION:  Reference is made to postmyelogram CT for further findings.   Electronically Signed By: Maisie Fus  Register On: 11/11/2018 09:22  Lumbar DG Myelogram: Results for orders placed during the hospital encounter of 10/19/15  DG MYELOGRAPHY LUMBAR INJ CERVICAL  Narrative CLINICAL DATA:  Cervical spondylosis.  Neck pain. Previous cervical fusion, now with RIGHT arm pain.  FLUOROSCOPY TIME:  42 seconds corresponding to a Dose Area Product of 71.3 Gy*m2  PROCEDURE: LUMBAR PUNCTURE FOR CERVICAL MYELOGRAM  After  thorough discussion of risks and benefits of the procedure including bleeding, infection, injury to nerves, blood vessels, adjacent structures as well as headache and CSF leak, written and oral informed consent was obtained. Consent was obtained by Dr. Davonna Belling. We discussed the high likelihood of obtaining a diagnostic study.  Patient was positioned prone on the fluoroscopy table. Local anesthesia was provided with 1% lidocaine without epinephrine after prepped and draped in the usual sterile fashion. Puncture was performed at L3-L4 using a 3 1/2 inch 22-gauge spinal needle via midline approach. Using a single pass through the dura, the needle was placed within the thecal sac, with return of clear CSF. 10 mL of Isovue-M 300 was injected into the thecal sac, with normal opacification of the nerve roots and cauda equina consistent with free flow within the subarachnoid space. The patient was then moved to the trendelenburg position and contrast flowed into the Cervical spine region.  I personally performed the lumbar puncture and administered the intrathecal contrast. I also personally supervised acquisition of the myelogram images.  TECHNIQUE: Contiguous axial images were obtained through the Cervical spine after the intrathecal infusion of infusion. Coronal and sagittal reconstructions were obtained of the axial image sets.  FINDINGS: CERVICAL MYELOGRAM FINDINGS:  Good opacification of the cervical subarachnoid space. Previous C5 through C7 arthrodesis. Anatomic alignment except for trace anterolisthesis C2-C3. No residual stenosis at the fusion site. Shallow ventral defects C3-C4. No nerve root cut off is evident.  Flexion extension upright views demonstrate no dynamic instability.  CT CERVICAL MYELOGRAM FINDINGS:  Alignment: Straightening of the normal cervical lordosis. Trace anterolisthesis C2-3 is facet mediated.  Vertebrae: No worrisome osseous lesion.  Solid  arthrodesis C5-C7.  Cord: No cord compression.  Posterior Fossa: No tonsillar herniation.  Vertebral Arteries: Not assessed.  Paraspinal tissues: Unremarkable.  Clear lung apices.  Pacemaker.  Disc levels:  The individual disc spaces were examined as follows:  C2-3:  Trace anterolisthesis is facet mediated.  No impingement.  C3-4:  Annular bulge.  No impingement.  C4-5:  Normal.  C5-6:  Solid fusion.  No impingement.  C6-7:  Solid fusion.  No impingement.  C7-T1:  Normal disc space.  Mild facet arthropathy.  No impingement.  IMPRESSION: Solid C5-C7 fusion.  No residual impingement.  Minor annular bulging at C3-4, without frank disc protrusion or significant RIGHT-sided neural impingement.   Electronically Signed By: Elsie Stain M.D. On: 10/19/2015 12:40    DG Epidurography  Narrative CLINICAL DATA:  Positional headaches following lumbar puncture for myelography.  EXAM: BLOOD PATCH  EPIDUROGRAM S+I  FLUOROSCOPY TIME:  29.81 uGy*m2  PROCEDURE: The procedure, risks, benefits, and alternatives were explained to the patient. Questions regarding the procedure were encouraged and answered. The patient understands and consents to the procedure.  LUMBAR EPIDURAL BLOOD PATCH: 20 ml of blood were withdrawn from the patient's antecubital fossa. An epidural approach was taken on theleft at L3-4 using a 20 gauge epidural needle. Epidural positioning was confirmed by injecting a small amount of Isovue-M 200. There was no vascular communication. 15 ml of the patient's blood was slowly injected into the epidural space in this location. The procedure was well-tolerated and she was discharged in good condition with instructions to lie down for additional day.  COMPLICATIONS: None  IMPRESSION: Lumbar epidural  blood patch on theleft at L3-4   Electronically Signed By: Marin Roberts M.D. On: 10/22/2015 11:26  CT KNEE LEFT WO  CONTRAST  Narrative CLINICAL DATA:  Left knee pain after twisting injury in June.  EXAM: CT OF THE LEFT KNEE WITHOUT CONTRAST  TECHNIQUE: Multidetector CT imaging of the LEFT knee was performed according to the standard protocol. Multiplanar CT image reconstructions were also generated.  COMPARISON:  None.  FINDINGS: Bones/Joint/Cartilage  No acute fracture or dislocation of the left knee. Slight femorotibial joint space narrowing along the medial compartment. No focal chondral defects are identified given lack of intra-articular contrast for better detail. Menisci grossly intact and not subluxed in appearance. Further assessment is limited by lack of intra-articular contrast.  Ligaments  Suboptimally assessed by CT.  Muscles and Tendons  No intramuscular mass or hemorrhage. No muscle atrophy is seen. The extensor tendon mechanism is maintained.  Soft tissues  A small amount of fluid is seen along the lateral synovial recess of the patellofemoral compartment. Findings may represent stigmata of iliotibial band friction syndrome.  IMPRESSION: 1. Small amount of fluid is seen deep to the iliotibial band along the lateral aspect of the knee raising the possibility of iliotibial band friction syndrome. 2. Mild medial femorotibial joint space narrowing. 3. Lack of intra-articular contrast limits assessment of the cartilage and menisci. No subluxation or dislocation of the menisci. 4. No acute osseous abnormality.   Electronically Signed By: Tollie Eth M.D. On: 10/15/2016 20:29    DG Knee Complete 4 Views Left  Narrative CLINICAL DATA:  Tripped and fell onto concrete on Friday, having LEFT ankle, knee, shin, elbow, and hip pain  EXAM: LEFT KNEE - COMPLETE 4+ VIEW  COMPARISON:  None  FINDINGS: Osseous mineralization normal.  Joint spaces preserved.  No fracture, dislocation, or bone destruction.  No joint effusion.  IMPRESSION: Normal  exam.   Electronically Signed By: Ulyses Southward M.D. On: 02/06/2020 10:56   Narrative CLINICAL DATA:  Tripped and fell onto concrete on Friday, having LEFT ankle, knee, shin, elbow, and hip pain  EXAM: LEFT ANKLE COMPLETE - 3+ VIEW  COMPARISON:  None  FINDINGS: Osseous mineralization normal.  Joint spaces preserved.  No fracture, dislocation, or bone destruction.  Tiny plantar calcaneal spur.  IMPRESSION: No acute abnormalities.   Electronically Signed By: Ulyses Southward M.D. On: 02/06/2020 10:55   DG Elbow Complete Left  Narrative CLINICAL DATA:  Tripped and fell onto concrete on Friday, having LEFT ankle, knee, shin, elbow, and hip pain  EXAM: LEFT ELBOW - COMPLETE 3+ VIEW  COMPARISON:  None  FINDINGS: Osseous mineralization normal.  Joint spaces preserved.  No acute fracture, dislocation, or bone destruction.  No definite elbow joint effusion.  IMPRESSION: No acute abnormalities.   Electronically Signed By: Ulyses Southward M.D. On: 02/06/2020 10:57    Complexity Note: Imaging results reviewed.                         ROS  Cardiovascular: Pacemaker or defibrillator and Weak heart (CHF) Pulmonary or Respiratory: Lung problems Neurological: No reported neurological signs or symptoms such as seizures, abnormal skin sensations, urinary and/or fecal incontinence, being born with an abnormal open spine and/or a tethered spinal cord Psychological-Psychiatric: Depressed Gastrointestinal: No reported gastrointestinal signs or symptoms such as vomiting or evacuating blood, reflux, heartburn, alternating episodes of diarrhea and constipation, inflamed or scarred liver, or pancreas or irrregular and/or infrequent bowel movements Genitourinary: No reported  renal or genitourinary signs or symptoms such as difficulty voiding or producing urine, peeing blood, non-functioning kidney, kidney stones, difficulty emptying the bladder, difficulty controlling the flow of  urine, or chronic kidney disease Hematological: No reported hematological signs or symptoms such as prolonged bleeding, low or poor functioning platelets, bruising or bleeding easily, hereditary bleeding problems, low energy levels due to low hemoglobin or being anemic Endocrine: No reported endocrine signs or symptoms such as high or low blood sugar, rapid heart rate due to high thyroid levels, obesity or weight gain due to slow thyroid or thyroid disease Rheumatologic: No reported rheumatological signs and symptoms such as fatigue, joint pain, tenderness, swelling, redness, heat, stiffness, decreased range of motion, with or without associated rash Musculoskeletal: Negative for myasthenia gravis, muscular dystrophy, multiple sclerosis or malignant hyperthermia Work History: Working full time  Allergies  Suzanne Spears is allergic to adhesive [tape], carvedilol, metoprolol, penicillin g, lisinopril, other, prednisone, latex, and levofloxacin.  Laboratory Chemistry Profile   Renal Lab Results  Component Value Date   BUN 20 07/25/2022   CREATININE 1.25 (H) 07/25/2022   BCR 13 07/17/2020   GFR 44.94 (L) 10/28/2018   GFRAA 54 (L) 11/04/2019   GFRNONAA 49 (L) 07/25/2022   SPECGRAV 1.015 04/18/2022   PHUR 7.0 04/18/2022   PROTEINUR Negative 04/18/2022     Electrolytes Lab Results  Component Value Date   NA 140 07/25/2022   K 4.7 07/25/2022   CL 106 07/25/2022   CALCIUM 9.5 07/25/2022   MG 1.8 05/02/2020     Hepatic Lab Results  Component Value Date   AST 25 01/30/2022   ALT 18 01/30/2022   ALBUMIN 4.2 01/30/2022   ALKPHOS 97 01/30/2022     ID Lab Results  Component Value Date   HIV NONREACTIVE 08/17/2015   SARSCOV2NAA NEGATIVE 09/30/2021   STAPHAUREUS NEGATIVE 11/28/2014   MRSAPCR NEGATIVE 11/28/2014   HCVAB NEGATIVE 08/17/2015   PREGTESTUR NEGATIVE 06/15/2014     Bone Lab Results  Component Value Date   VD25OH 43.92 12/08/2013     Endocrine Lab Results  Component  Value Date   GLUCOSE 96 07/25/2022   GLUCOSEU NEGATIVE 11/27/2016   HGBA1C 6.0 03/10/2022   TSH 1.00 03/10/2022   FREET4 0.71 03/31/2017     Neuropathy Lab Results  Component Value Date   VITAMINB12 652 03/10/2022   FOLATE 16.0 03/10/2022   HGBA1C 6.0 03/10/2022   HIV NONREACTIVE 08/17/2015     CNS No results found for: "COLORCSF", "APPEARCSF", "RBCCOUNTCSF", "WBCCSF", "POLYSCSF", "LYMPHSCSF", "EOSCSF", "PROTEINCSF", "GLUCCSF", "JCVIRUS", "CSFOLI", "IGGCSF", "LABACHR", "ACETBL"   Inflammation (CRP: Acute  ESR: Chronic) Lab Results  Component Value Date   CRP <1.0 06/05/2022   ESRSEDRATE 18 06/05/2022     Rheumatology Lab Results  Component Value Date   RF <14 05/12/2017   ANA NEGATIVE 06/05/2022   LABURIC 6.3 05/12/2017     Coagulation Lab Results  Component Value Date   INR 1.0 03/02/2020   LABPROT 12.8 03/02/2020   APTT 33 03/02/2020   PLT 218.0 06/05/2022   DDIMER 0.54 (H) 12/27/2020     Cardiovascular Lab Results  Component Value Date   BNP 54.1 12/27/2020   CKTOTAL 103 06/14/2013   CKMB 0.9 11/04/2019   TROPONINI <0.03 11/27/2016   HGB 15.4 (H) 06/05/2022   HCT 45.9 06/05/2022     Screening Lab Results  Component Value Date   SARSCOV2NAA NEGATIVE 09/30/2021   STAPHAUREUS NEGATIVE 11/28/2014   MRSAPCR NEGATIVE 11/28/2014   HCVAB NEGATIVE 08/17/2015  HIV NONREACTIVE 08/17/2015   PREGTESTUR NEGATIVE 06/15/2014     Cancer No results found for: "CEA", "CA125", "LABCA2"   Allergens No results found for: "ALMOND", "APPLE", "ASPARAGUS", "AVOCADO", "BANANA", "BARLEY", "BASIL", "BAYLEAF", "GREENBEAN", "LIMABEAN", "WHITEBEAN", "BEEFIGE", "REDBEET", "BLUEBERRY", "BROCCOLI", "CABBAGE", "MELON", "CARROT", "CASEIN", "CASHEWNUT", "CAULIFLOWER", "CELERY"     Note: Lab results reviewed.  PFSH  Drug: Suzanne Spears  reports no history of drug use. Alcohol:  reports current alcohol use. Tobacco:  reports that she has never smoked. She has never used smokeless  tobacco. Medical:  has a past medical history of AICD (automatic cardioverter/defibrillator) present, Allergy, Anemia, Asthma, Breast calcifications on mammogram (06/21/2019), Cardiomyopathy, dilated, nonischemic (HCC), CKD (chronic kidney disease), stage III (HCC), Cough, Depression, Dizziness, GERD (gastroesophageal reflux disease), Heart failure with mid range ejection fraction (HFmrEF) (HCC), HFrEF (heart failure with reduced ejection fraction) (HCC), Hip pain, right (03/1998), History of 2019 novel coronavirus disease (COVID-19) (03/11/2019), History of bronchitis, History of shingles, Hyperlipidemia, Hypertension, Left bundle branch block (2008), Migraine, Mitral valve prolapse syndrome, Pericarditis (2008), Peripheral neuropathy, Pneumonia (2016), Presence of permanent cardiac pacemaker, and Spinal headache. Family: family history includes Breast cancer in her maternal aunt; Breast cancer (age of onset: 51) in her maternal grandmother; Cancer in her paternal grandfather; Cancer (age of onset: 60) in her mother; Cancer (age of onset: 25) in her maternal grandmother; Diabetes in her maternal grandmother; Pneumonia in her father; Supraventricular tachycardia in her daughter.  Past Surgical History:  Procedure Laterality Date   ANTERIOR CERVICAL DECOMP/DISCECTOMY FUSION N/A 10/21/2012   Procedure: ANTERIOR CERVICAL DECOMPRESSION/DISCECTOMY FUSION 2 LEVELS;  Surgeon: Cristi Loron, MD;  Location: [facility];  Service: Neurosurgery;  Laterality: N/A;  C56 C67 anterior cervical decompression with fusion interbody prothesis plating and bonegraft   APPENDECTOMY  2009   for appendicitis, , [facility]   BI-VENTRICULAR IMPLANTABLE CARDIOVERTER DEFIBRILLATOR N/A 06/15/2014   MDT CRTD implanted by Dr Ladona Ridgel   blood clot removed from left top hand     BREAST BIOPSY Left 12/17/2018   COLUMNAR CELL CHANGE , coil clip, stereo bx   BREAST BIOPSY Left 12/17/2018   FOCAL COLUMNAR CELL CHANGE , x clip, stereo bx     CARDIAC CATHETERIZATION  01/13/05/2015   normal coronaries, EF 50%   CARDIAC CATHETERIZATION     CESAREAN SECTION     x 2   COLONOSCOPY     Hx: of   COLONOSCOPY WITH PROPOFOL N/A 07/12/2021   Procedure: COLONOSCOPY WITH PROPOFOL;  Surgeon: Midge Minium, MD;  Location: [facility] ENDOSCOPY;  Service: Endoscopy;  Laterality: N/A;   EPICARDIAL PACING LEAD PLACEMENT N/A 11/30/2014   Procedure: EPICARDIAL PACING LEAD PLACEMENT;  Surgeon: Alleen Borne, MD;  Location: [facility] OR;  Service: Thoracic;  Laterality: N/A;   ESOPHAGOGASTRODUODENOSCOPY (EGD) WITH PROPOFOL N/A 04/07/2017   Procedure: ESOPHAGOGASTRODUODENOSCOPY (EGD) WITH PROPOFOL;  Surgeon: Scot Jun, MD;  Location: [facility] ENDOSCOPY;  Service: Endoscopy;  Laterality: N/A;   ganglionic cyst  remote   right wrist   ICD GENERATOR CHANGEOUT N/A 10/24/2021   Procedure: ICD GENERATOR CHANGEOUT;  Surgeon: Duke Salvia, MD;  Location: [facility] INVASIVE CV LAB;  Service: Cardiovascular;  Laterality: N/A;   tendon release surgery Left    THORACOTOMY Left 11/30/2014   Procedure: THORACOTOMY MAJOR;  Surgeon: Alleen Borne, MD;  Location: [facility] OR;  Service: Thoracic;  Laterality: Left;   TONSILLECTOMY     turbinectomy  2009   [place]   Active Ambulatory Problems    Diagnosis Date Noted  Asthma, chronic    Left bundle branch block    Mitral valve prolapse syndrome    Fatigue 03/11/2011   Cardiomyopathy, dilated, nonischemic (HCC)    B12 deficiency 04/20/2012   Visit for preventive health examination 05/05/2012   Dyspareunia, female 05/05/2012   Unspecified hereditary and idiopathic peripheral neuropathy 06/14/2013   Edema 06/14/2013   Amaurosis fugax 12/28/2013   Depression with anxiety 03/29/2014   Insomnia 03/29/2014   Chronic systolic heart failure (HCC) 04/27/2014   S/P ICD (internal cardiac defibrillator) procedure 06/15/2014   Status post thoracotomy 11/30/2014   Encounter for therapeutic drug monitoring 12/07/2014   Carpal tunnel  syndrome 02/07/2016   Chronic diastolic CHF (congestive heart failure) (HCC) 08/24/2016   Ocular migraine 12/02/2016   Habitual snoring 01/31/2017   GERD (gastroesophageal reflux disease) 03/31/2017   Polyarthritis of multiple sites 05/13/2017   Heel pain, bilateral 03/23/2018   Concussion with no loss of consciousness, subsequent encounter 03/23/2018   Chronic kidney disease, stage II (mild) 10/31/2018   History of 2019 novel coronavirus disease (COVID-19) 11/10/2019   Myalgia due to statin 11/12/2019   Encounter for preventive health examination 11/12/2019   Cervical radicular pain 03/07/2020   Abdominal bloating with cramps 05/01/2020   Colon cancer screening    Polyp of ascending colon    Cervical spondylosis 09/14/2012   Cervico-occipital neuralgia 10/19/2018   Chronic neck pain 04/08/2013   Coronary arteriosclerosis 12/16/2018   Essential (primary) hypertension 09/08/2014   Headache, post-myelogram 10/22/2015   Hyperlipidemia, mixed 04/18/2022   Other dysphagia 02/10/2017   Presence of cardiac pacemaker 06/15/2014   Proteinuria 12/16/2018   Right subscapular pain 01/11/2016   Migraine 04/21/2022   Chronic pain syndrome 07/29/2022   Resolved Ambulatory Problems    Diagnosis Date Noted   Acute MI [facility])    Pericarditis    Hip pain, right    Rhinitis, allergic, with asthma, without status asthmaticus 04/30/2011   Colitis, enteritis, and gastroenteritis of presumed infectious origin 05/20/2011   Otitis media of left ear 12/31/2011   Viral illness 03/30/2012   Abdominal pain 04/20/2012   Dizziness and giddiness 05/05/2012   Statin intolerance 05/12/2013   Decreased glomerular filtration rate (GFR) 12/04/2013   Vision, loss, sudden 12/27/2013   CAP (community acquired pneumonia) 03/29/2014   Phlebitis after infusion 03/29/2014   Rib pain 04/29/2014   Hypopigmented skin lesion 04/29/2014   Cat bite 10/20/2014   Cough 11/24/2014   Breast pain, right 11/24/2014   Acute  pulmonary embolism (HCC) 12/03/2014   Sinusitis, acute frontal 01/23/2015   Viral URI 03/30/2015   Sinusitis, acute frontal 04/10/2015   Cough 05/15/2015   Pain of right upper arm 03/23/2016   Light headedness 11/30/2016   Flu-like symptoms 12/02/2016   Esophageal spasm 07/11/2017   COVID-19 virus infection 03/11/2019   Suspected 2019-nCoV infection 04/27/2019   Breast calcifications on mammogram 06/21/2019   Headache 03/07/2020   Incoherent speech 03/07/2020   Gastritis 05/01/2020   Past Medical History:  Diagnosis Date   AICD (automatic cardioverter/defibrillator) present    Allergy    Anemia    Asthma    CKD (chronic kidney disease), stage III (HCC)    Depression    Dizziness    Heart failure with mid range ejection fraction (HFmrEF) (HCC)    HFrEF (heart failure with reduced ejection fraction) (HCC)    History of bronchitis    History of shingles    Hyperlipidemia    Hypertension    Peripheral neuropathy  Pneumonia 2016   Presence of permanent cardiac pacemaker    Spinal headache    Constitutional Exam  General appearance: Well nourished, well developed, and well hydrated. In no apparent acute distress Vitals:   07/29/22 1009  BP: 114/68  Pulse: 94  Temp: 97.9 F (36.6 C)  SpO2: 98%  Weight: 165 lb (74.8 kg)  Height: 5\' 6"  (1.676 m)   BMI Assessment: Estimated body mass index is 26.63 kg/m as calculated from the following:   Height as of this encounter: 5\' 6"  (1.676 m).   Weight as of this encounter: 165 lb (74.8 kg).  BMI interpretation table: BMI level Category Range association with higher incidence of chronic pain  <18 kg/m2 Underweight   18.5-24.9 kg/m2 Ideal body weight   25-29.9 kg/m2 Overweight Increased incidence by 20%  30-34.9 kg/m2 Obese (Class I) Increased incidence by 68%  35-39.9 kg/m2 Severe obesity (Class II) Increased incidence by 136%  >40 kg/m2 Extreme obesity (Class III) Increased incidence by 254%   Patient's current BMI Ideal  Body weight  Body mass index is 26.63 kg/m. Ideal body weight: 59.3 kg (130 lb 11.7 oz) Adjusted ideal body weight: 65.5 kg (144 lb 7 oz)   BMI Readings from Last 4 Encounters:  07/29/22 26.63 kg/m  07/25/22 27.09 kg/m  06/05/22 26.06 kg/m  04/18/22 26.33 kg/m   Wt Readings from Last 4 Encounters:  07/29/22 165 lb (74.8 kg)  07/25/22 165 lb 5 oz (75 kg)  06/05/22 159 lb (72.1 kg)  04/18/22 158 lb 3.2 oz (71.8 kg)    Psych/Mental status: Alert, oriented x 3 (person, place, & time)       Eyes: PERLA Respiratory: No evidence of acute respiratory distress  Cervical Spine Area Exam  Skin & Axial Inspection: No masses, redness, edema, swelling, or associated skin lesions Alignment: Symmetrical Functional ROM: Pain restricted ROM, to the right Stability: No instability detected Muscle Tone/Strength: Functionally intact. No obvious neuro-muscular anomalies detected. Sensory (Neurological): Dermatomal pain pattern and MSK Palpation: No palpable anomalies             Upper Extremity (UE) Exam    Side: Right upper extremity  Side: Left upper extremity  Skin & Extremity Inspection: Skin color, temperature, and hair growth are WNL. No peripheral edema or cyanosis. No masses, redness, swelling, asymmetry, or associated skin lesions. No contractures.  Skin & Extremity Inspection: Skin color, temperature, and hair growth are WNL. No peripheral edema or cyanosis. No masses, redness, swelling, asymmetry, or associated skin lesions. No contractures.  Functional ROM: Pain restricted ROM for shoulder and elbow  Functional ROM: Unrestricted ROM          Muscle Tone/Strength: Functionally intact. No obvious neuro-muscular anomalies detected.  Muscle Tone/Strength: Functionally intact. No obvious neuro-muscular anomalies detected.  Sensory (Neurological): Referred pain pattern          Sensory (Neurological): Unimpaired          Palpation: No palpable anomalies              Palpation: No palpable  anomalies              Provocative Test(s):  Phalen's test: deferred Tinel's test: deferred Apley's scratch test (touch opposite shoulder):  Action 1 (Across chest): deferred Action 2 (Overhead): deferred Action 3 (LB reach): deferred   Provocative Test(s):  Phalen's test: deferred Tinel's test: deferred Apley's scratch test (touch opposite shoulder):  Action 1 (Across chest): deferred Action 2 (Overhead): deferred Action 3 (LB reach): deferred  Assessment  Primary Diagnosis & Pertinent Problem List: The primary encounter diagnosis was Cervical facet joint syndrome. Diagnoses of Cervical spondylosis, Cervical radicular pain, and Chronic pain syndrome were also pertinent to this visit.  Visit Diagnosis (New problems to examiner): 1. Cervical facet joint syndrome   2. Cervical spondylosis   3. Cervical radicular pain   4. Chronic pain syndrome    Plan of Care (Initial workup plan)  Suzanne Spears has a history of greater than 3 months of moderate to severe pain which is resulted in functional impairment.  The patient has tried various conservative therapeutic options such as NSAIDs, Tylenol, muscle relaxants, physical therapy, cervical spinal injections which were inadequately effective.  Patient's pain is predominantly axial with physical exam and C-MRI findings suggestive of facet arthropathy. Cervical facet medial branch nerve blocks were discussed with the patient.  Risks and benefits were reviewed.  Patient would like to proceed with RIGHT C2, 3, 4  medial branch nerve block.  I also discussed cervical radiofrequency ablation with the patient if her diagnostic nerve blocks were helpful.   Procedure Orders         CERVICAL FACET (MEDIAL BRANCH NERVE BLOCK)      Provider-requested follow-up: Return in about 15 days (around 08/13/2022) for Right C2, C3, C4 MBNB, in clinic IV Versed.  Future Appointments  Date Time Provider Department Center  08/11/2022  7:30 AM [facility]  DEVICE REMOTES [facility]  10/23/2022  7:00 AM [facility] DEVICE REMOTES [facility]  10/28/2022  7:20 AM [facility] DEVICE REMOTES [facility]  01/27/2023  7:20 AM [facility] DEVICE REMOTES [facility]  01/29/2023  9:20 AM Duke Salvia, MD [facility]  04/28/2023  7:20 AM [facility] DEVICE REMOTES [facility]  07/28/2023  7:20 AM [facility] DEVICE REMOTES [facility]  10/27/2023  7:20 AM [facility] DEVICE REMOTES [facility]  01/26/2024  7:20 AM [facility] DEVICE REMOTES [facility]    Duration of encounter: .  Total time on encounter, as per AMA guidelines included both the face-to-face and non-face-to-face time personally spent by the physician and/or other qualified health care professional(s) on the day of the encounter (includes time in activities that require the physician or other qualified health care professional and does not include time in activities normally performed by clinical staff). Physician's time may include the following activities when performed: Preparing to see the patient (e.g., pre-charting review of records, searching for previously ordered imaging, lab work, and nerve conduction tests) Review of prior analgesic pharmacotherapies. Reviewing PMP Interpreting ordered tests (e.g., lab work, imaging, nerve conduction tests) Performing post-procedure evaluations, including interpretation of diagnostic procedures Obtaining and/or reviewing separately obtained history Performing a medically appropriate examination and/or evaluation Counseling and educating the patient/family/caregiver Ordering medications, tests, or procedures Referring and communicating with other health care professionals (when not separately reported) Documenting clinical information in the electronic or other health record Independently interpreting results (not separately reported) and  communicating results to the patient/ family/caregiver Care coordination (not separately reported)  Note by: Edward Jolly, MD (TTS technology used. I apologize for any typographical errors that were not detected and corrected.) Date: 07/29/2022; Time: 10:41 AM

## 2022-07-29 NOTE — Progress Notes (Signed)
Safety precautions to be maintained throughout the outpatient stay will include: orient to surroundings, keep bed in low position, maintain call bell within reach at all times, provide assistance with transfer out of bed and ambulation.  

## 2022-07-31 ENCOUNTER — Encounter: Payer: Self-pay | Admitting: Internal Medicine

## 2022-08-11 ENCOUNTER — Encounter: Payer: Self-pay | Admitting: Student

## 2022-08-11 ENCOUNTER — Ambulatory Visit: Payer: 59 | Attending: Internal Medicine

## 2022-08-11 DIAGNOSIS — Z95810 Presence of automatic (implantable) cardiac defibrillator: Secondary | ICD-10-CM

## 2022-08-11 DIAGNOSIS — I5022 Chronic systolic (congestive) heart failure: Secondary | ICD-10-CM

## 2022-08-12 ENCOUNTER — Other Ambulatory Visit: Payer: Self-pay | Admitting: Student

## 2022-08-12 DIAGNOSIS — M84362A Stress fracture, left tibia, initial encounter for fracture: Secondary | ICD-10-CM

## 2022-08-12 DIAGNOSIS — S83281A Other tear of lateral meniscus, current injury, right knee, initial encounter: Secondary | ICD-10-CM

## 2022-08-12 NOTE — Progress Notes (Signed)
Remote ICD transmission.   

## 2022-08-14 ENCOUNTER — Ambulatory Visit
Admission: RE | Admit: 2022-08-14 | Discharge: 2022-08-14 | Disposition: A | Discharge status: Home / Self Care | Payer: No Typology Code available for payment source | Source: Ambulatory Visit | Attending: Student | Admitting: Student

## 2022-08-14 ENCOUNTER — Other Ambulatory Visit
Admission: RE | Admit: 2022-08-14 | Discharge: 2022-08-14 | Disposition: A | Discharge status: Home / Self Care | Payer: No Typology Code available for payment source | Source: Ambulatory Visit | Attending: Student | Admitting: Student

## 2022-08-14 ENCOUNTER — Ambulatory Visit
Admission: RE | Admit: 2022-08-14 | Discharge: 2022-08-14 | Disposition: A | Discharge status: Home / Self Care | Payer: 59 | Source: Ambulatory Visit | Attending: Student | Admitting: Student

## 2022-08-14 DIAGNOSIS — S83281A Other tear of lateral meniscus, current injury, right knee, initial encounter: Secondary | ICD-10-CM | POA: Insufficient documentation

## 2022-08-14 DIAGNOSIS — I5022 Chronic systolic (congestive) heart failure: Secondary | ICD-10-CM

## 2022-08-14 DIAGNOSIS — M84362A Stress fracture, left tibia, initial encounter for fracture: Secondary | ICD-10-CM

## 2022-08-14 DIAGNOSIS — I420 Dilated cardiomyopathy: Secondary | ICD-10-CM

## 2022-08-14 DIAGNOSIS — Z79899 Other long term (current) drug therapy: Secondary | ICD-10-CM

## 2022-08-14 LAB — BASIC METABOLIC PANEL
Anion gap: 10 (ref 5–15)
BUN: 26 mg/dL — ABNORMAL HIGH (ref 8–23)
CO2: 27 mmol/L (ref 22–32)
Calcium: 9.1 mg/dL (ref 8.9–10.3)
Chloride: 101 mmol/L (ref 98–111)
Creatinine, Ser: 1.36 mg/dL — ABNORMAL HIGH (ref 0.44–1.00)
GFR, Estimated: 44 mL/min — ABNORMAL LOW (ref >60)
Glucose, Bld: 90 mg/dL (ref 70–99)
Potassium: 4.2 mmol/L (ref 3.5–5.1)
Sodium: 138 mmol/L (ref 135–145)

## 2022-08-14 MED ORDER — SODIUM CHLORIDE (PF) 0.9 % IJ SOLN
20.0000 mL | INTRAMUSCULAR | Status: DC | PRN
  Administered 2022-08-14: 20 mL

## 2022-08-14 MED ORDER — LIDOCAINE HCL (PF) 1 % IJ SOLN
10.0000 mL | Freq: Once | INTRAMUSCULAR | Status: AC
  Administered 2022-08-14: 8 mL
  Filled 2022-08-14: qty 10

## 2022-08-14 MED ORDER — IOHEXOL 180 MG/ML  SOLN
20.0000 mL | Freq: Once | INTRAMUSCULAR | Status: AC | PRN
  Administered 2022-08-14: 20 mL via INTRA_ARTICULAR

## 2022-08-15 NOTE — Progress Notes (Signed)
EPIC Encounter for ICM Monitoring  Patient Name: Suzanne Spears is a 62 y.o. female Date: 08/15/2022 Primary Care Physican: Sherlene Shams, MD Primary Cardiologist: Mariah Milling Electrophysiologist: Joycelyn Schmid Pacing:  98.4%    06/04/2022 Weight: 159 lbs 07/25/2022 Office Weight: 165 lbs            Spoke with patient and heart failure questions reviewed.  Transmission results reviewed.  Pt asymptomatic for fluid accumulation.  She has been on crutches for past 2 weeks.   Diet: Not following low salt diet   OptiVol Thoracic impedance suggesting intermittent days with possible fluid accumulation within the last month.    Prescribed:  Furosemide 20 mg Take1 tablet (20 mg) once daily. May take extra 20 mg as needed for weight gain or swelling.   Spironolactone 25 mg take 1 tablet daily   Labs: 08/14/2022 Creatinine 1.36, BUN 26, Potassium 4.2, Sodium 138, GFR 44  07/25/2022 Creatinine 1.25, BUN 20, Potassium 4.7, Sodium 138, GFR 49  A complete set of results can be found in Results Review.   Recommendations:  No changes and encouraged to call if experiencing any fluid symptoms..    Follow-up plan: ICM clinic phone appointment on 09/15/2022.  91 day device clinic remote transmission 10/23/2022.     EP/Cardiology Office Visits:  01/29/2023 with Dr Graciela Husbands.   Copy of ICM check sent to Dr. Graciela Husbands.     3 month ICM trend: 08/11/2022.    12-14 Month ICM trend:     Karie Soda, RN 08/15/2022 8:43 AM

## 2022-08-20 ENCOUNTER — Encounter: Payer: Self-pay | Admitting: Student in an Organized Health Care Education/Training Program

## 2022-08-20 ENCOUNTER — Ambulatory Visit
Payer: No Typology Code available for payment source | Attending: Student in an Organized Health Care Education/Training Program | Admitting: Student in an Organized Health Care Education/Training Program

## 2022-08-20 ENCOUNTER — Ambulatory Visit
Admission: RE | Admit: 2022-08-20 | Discharge: 2022-08-20 | Disposition: A | Discharge status: Home / Self Care | Payer: 59 | Source: Ambulatory Visit | Attending: Student in an Organized Health Care Education/Training Program | Admitting: Student in an Organized Health Care Education/Training Program

## 2022-08-20 DIAGNOSIS — G894 Chronic pain syndrome: Secondary | ICD-10-CM

## 2022-08-20 DIAGNOSIS — M47812 Spondylosis without myelopathy or radiculopathy, cervical region: Secondary | ICD-10-CM | POA: Diagnosis present

## 2022-08-20 MED ORDER — DEXAMETHASONE SODIUM PHOSPHATE 10 MG/ML IJ SOLN
10.0000 mg | Freq: Once | INTRAMUSCULAR | Status: AC
  Administered 2022-08-20: 10 mg

## 2022-08-20 MED ORDER — LIDOCAINE HCL 2 % IJ SOLN
20.0000 mL | Freq: Once | INTRAMUSCULAR | Status: AC
  Administered 2022-08-20: 400 mg

## 2022-08-20 MED ORDER — DIAZEPAM 5 MG PO TABS
5.0000 mg | ORAL_TABLET | ORAL | Status: AC
  Administered 2022-08-20: 5 mg via ORAL

## 2022-08-20 MED ORDER — LIDOCAINE HCL 2 % IJ SOLN
INTRAMUSCULAR | Status: AC
  Filled 2022-08-20: qty 20

## 2022-08-20 MED ORDER — ROPIVACAINE HCL 2 MG/ML IJ SOLN
INTRAMUSCULAR | Status: AC
  Filled 2022-08-20: qty 20

## 2022-08-20 MED ORDER — ROPIVACAINE HCL 2 MG/ML IJ SOLN
9.0000 mL | Freq: Once | INTRAMUSCULAR | Status: AC
  Administered 2022-08-20: 9 mL via PERINEURAL

## 2022-08-20 MED ORDER — DEXAMETHASONE SODIUM PHOSPHATE 10 MG/ML IJ SOLN
INTRAMUSCULAR | Status: AC
  Filled 2022-08-20: qty 1

## 2022-08-20 MED ORDER — DIAZEPAM 5 MG PO TABS
ORAL_TABLET | ORAL | Status: AC
  Filled 2022-08-20: qty 1

## 2022-08-20 NOTE — Patient Instructions (Signed)
Pain Management Discharge Instructions  General Discharge Instructions :  If you need to reach your doctor call: Monday-Friday 8:00 am - 4:00 pm at [phone] or toll free [phone].  After clinic hours [phone] to have operator reach doctor.  Bring all of your medication bottles to all your appointments in the pain clinic.  To cancel or reschedule your appointment with Pain Management please remember to call 24 hours in advance to avoid a fee.  Refer to the educational materials which you have been given on: General Risks, I had my Procedure. Discharge Instructions, Post Sedation.  Post Procedure Instructions:  The drugs you were given will stay in your system until tomorrow, so for the next 24 hours you should not drive, make any legal decisions or drink any alcoholic beverages.  You may eat anything you prefer, but it is better to start with liquids then soups and crackers, and gradually work up to solid foods.  Please notify your doctor immediately if you have any unusual bleeding, trouble breathing or pain that is not related to your normal pain.  Depending on the type of procedure that was done, some parts of your body may feel week and/or numb.  This usually clears up by tonight or the next day.  Walk with the use of an assistive device or accompanied by an adult for the 24 hours.  You may use ice on the affected area for the first 24 hours.  Put ice in a Ziploc bag and cover with a towel and place against area 15 minutes on 15 minutes off.  You may switch to heat after 24 hours.Facet Blocks Patient Information  Description: The facets are joints in the spine between the vertebrae.  Like any joints in the body, facets can become irritated and painful.  Arthritis can also effect the facets.  By injecting steroids and local anesthetic in and around these joints, we can temporarily block the nerve supply to them.  Steroids act directly on irritated nerves and tissues to  reduce selling and inflammation which often leads to decreased pain.  Facet blocks may be done anywhere along the spine from the neck to the low back depending upon the location of your pain.   After numbing the skin with local anesthetic (like Novocaine), a small needle is passed onto the facet joints under x-ray guidance.  You may experience a sensation of pressure while this is being done.  The entire block usually lasts about 15-25 minutes.   Conditions which may be treated by facet blocks:  Low back/buttock pain Neck/shoulder pain Certain types of headaches  Preparation for the injection:  Do not eat any solid food or dairy products within 8 hours of your appointment. You may drink clear liquid up to 3 hours before appointment.  Clear liquids include water, black coffee, juice or soda.  No milk or cream please. You may take your regular medication, including pain medications, with a sip of water before your appointment.  Diabetics should hold regular insulin (if taken separately) and take 1/2 normal NPH dose the morning of the procedure.  Carry some sugar containing items with you to your appointment. A driver must accompany you and be prepared to drive you home after your procedure. Bring all your current medications with you. An IV may be inserted and sedation may be given at the discretion of the physician. A blood pressure cuff, EKG and other monitors will often be applied during the procedure.  Some patients may need to   have extra oxygen administered for a short period. You will be asked to provide medical information, including your allergies and medications, prior to the procedure.  We must know immediately if you are taking blood thinners (like Coumadin/Warfarin) or if you are allergic to IV iodine contrast (dye).  We must know if you could possible be pregnant.  Possible side-effects:  Bleeding from needle site Infection (rare, may require surgery) Nerve injury (rare) Numbness  & tingling (temporary) Difficulty urinating (rare, temporary) Spinal headache (a headache worse with upright posture) Light-headedness (temporary) Pain at injection site (serveral days) Decreased blood pressure (rare, temporary) Weakness in arm/leg (temporary) Pressure sensation in back/neck (temporary)   Call if you experience:  Fever/chills associated with headache or increased back/neck pain Headache worsened by an upright position New onset, weakness or numbness of an extremity below the injection site Hives or difficulty breathing (go to the emergency room) Inflammation or drainage at the injection site(s) Severe back/neck pain greater than usual New symptoms which are concerning to you  Please note:  Although the local anesthetic injected can often make your back or neck feel good for several hours after the injection, the pain will likely return. It takes 3-7 days for steroids to work.  You may not notice any pain relief for at least one week.  If effective, we will often do a series of 2-3 injections spaced 3-6 weeks apart to maximally decrease your pain.  After the initial series, you may be a candidate for a more permanent nerve block of the facets.  If you have any questions, please call [phone] [facility] 

## 2022-08-20 NOTE — Progress Notes (Signed)
PROVIDER NOTE: Interpretation of information contained herein should be left to medically-trained personnel. Specific patient instructions are provided elsewhere under "Patient Instructions" section of medical record. This document was created in part using STT-dictation technology, any transcriptional errors that may result from this process are unintentional.  Patient: Suzanne Spears Type: Established DOB: July 30, 1960 MRN: [id number] PCP: Sherlene Shams, MD  Service: Procedure DOS: 08/20/2022 Setting: Ambulatory Location: Ambulatory outpatient facility Delivery: Face-to-face Provider: Edward Jolly, MD Specialty: Interventional Pain Management Specialty designation: 09 Location: Outpatient facility Ref. Prov.: Edward Jolly, MD       Interventional Therapy   Procedure: Cervical Facet Medial Branch Block(s) #1  Laterality: Right  Level: TON, C3, and C4 Medial Branch Level(s). Injecting these levels blocks the C2-3 and C3-4 cervical facet joints.  Imaging: Fluoroscopic guidance Anesthesia: Local anesthesia (1-2% Lidocaine) Anxiolysis: Valium 5 mg PO DOS: 08/20/2022  Performed by: Edward Jolly, MD  Purpose: Diagnostic/Therapeutic Indications: Cervicalgia (cervical spine axial pain) severe enough to impact quality of life or function. 1. Cervical facet joint syndrome   2. Cervical spondylosis   3. Chronic pain syndrome    NAS-11 Pain score:   Pre-procedure: 7 /10   Post-procedure: 2 /10     Position / Prep / Materials:  Position: Prone. Head in cradle. C-spine slightly flexed. Prep solution: DuraPrep (Iodine Povacrylex [0.7% available iodine] and Isopropyl Alcohol, 74% w/w) Prep Area: Posterior Cervico-thoracic Region. From occipital ridge to tip of scapula, and from shoulder to shoulder. Entire posterior and lateral neck surface. Materials:  Tray: Block Needle(s):  Type: Spinal  Gauge (G): 22  Length: 3.5-in  Qty:  1      Pre-op H&P Assessment:  Suzanne Spears is a 62 y.o.  (year old), female patient, seen today for interventional treatment. She  has a past surgical history that includes Cesarean section; Appendectomy (2009); turbinectomy (2009); ganglionic cyst (remote); tendon release surgery (Left); Colonoscopy; Tonsillectomy; Anterior cervical decomp/discectomy fusion (N/A, 10/21/2012); bi-ventricular implantable cardioverter defibrillator (N/A, 06/15/2014); blood clot removed from left top hand; Thoracotomy (Left, 11/30/2014); Epicardial pacing lead placement (N/A, 11/30/2014); Cardiac catheterization (01/13/05/2015); Cardiac catheterization; Esophagogastroduodenoscopy (egd) with propofol (N/A, 04/07/2017); Breast biopsy (Left, 12/17/2018); Breast biopsy (Left, 12/17/2018); Colonoscopy with propofol (N/A, 07/12/2021); and ICD GENERATOR CHANGEOUT (N/A, 10/24/2021). Suzanne Spears has a current medication list which includes the following prescription(s): baclofen, cetirizine, cyanocobalamin, dapagliflozin propanediol, duloxetine, elderberry, entresto, ezetimibe, fluticasone, furosemide, gabapentin, multiple minerals-vitamins, rosuvastatin, spironolactone, tramadol, ubrelvy, valacyclovir, venlafaxine xr, fremanezumab-vfrm, and syringe 3cc/25gx1". Her primarily concern today is the Neck Pain  Initial Vital Signs:  Pulse/HCG Rate: 92ECG Heart Rate: (!) 103 Temp: 98.1 F (36.7 C) Resp: 18 BP: 113/76 SpO2: 98 %  BMI: Estimated body mass index is 27.12 kg/m as calculated from the following:   Height as of this encounter: 5\' 5"  (1.651 m).   Weight as of this encounter: 163 lb (73.9 kg).  Risk Assessment: Allergies: Reviewed. She is allergic to adhesive [tape], carvedilol, metoprolol, penicillin g, lisinopril, other, prednisone, latex, and levofloxacin.  Allergy Precautions: None required Coagulopathies: Reviewed. None identified.  Blood-thinner therapy: None at this time Active Infection(s): Reviewed. None identified. Suzanne Spears is afebrile  Site Confirmation: Suzanne Spears was asked  to confirm the procedure and laterality before marking the site Procedure checklist: Completed Consent: Before the procedure and under the influence of no sedative(s), amnesic(s), or anxiolytics, the patient was informed of the treatment options, risks and possible complications. To fulfill our ethical and legal obligations, as recommended by the American Medical Association's Code of  Ethics, I have informed the patient of my clinical impression; the nature and purpose of the treatment or procedure; the risks, benefits, and possible complications of the intervention; the alternatives, including doing nothing; the risk(s) and benefit(s) of the alternative treatment(s) or procedure(s); and the risk(s) and benefit(s) of doing nothing. The patient was provided information about the general risks and possible complications associated with the procedure. These may include, but are not limited to: failure to achieve desired goals, infection, bleeding, organ or nerve damage, allergic reactions, paralysis, and death. In addition, the patient was informed of those risks and complications associated to Spine-related procedures, such as failure to decrease pain; infection (i.e.: Meningitis, epidural or intraspinal abscess); bleeding (i.e.: epidural hematoma, subarachnoid hemorrhage, or any other type of intraspinal or peri-dural bleeding); organ or nerve damage (i.e.: Any type of peripheral nerve, nerve root, or spinal cord injury) with subsequent damage to sensory, motor, and/or autonomic systems, resulting in permanent pain, numbness, and/or weakness of one or several areas of the body; allergic reactions; (i.e.: anaphylactic reaction); and/or death. Furthermore, the patient was informed of those risks and complications associated with the medications. These include, but are not limited to: allergic reactions (i.e.: anaphylactic or anaphylactoid reaction(s)); adrenal axis suppression; blood sugar elevation that in  diabetics may result in ketoacidosis or comma; water retention that in patients with history of congestive heart failure may result in shortness of breath, pulmonary edema, and decompensation with resultant heart failure; weight gain; swelling or edema; medication-induced neural toxicity; particulate matter embolism and blood vessel occlusion with resultant organ, and/or nervous system infarction; and/or aseptic necrosis of one or more joints. Finally, the patient was informed that Medicine is not an exact science; therefore, there is also the possibility of unforeseen or unpredictable risks and/or possible complications that may result in a catastrophic outcome. The patient indicated having understood very clearly. We have given the patient no guarantees and we have made no promises. Enough time was given to the patient to ask questions, all of which were answered to the patient's satisfaction. Suzanne Spears has indicated that she wanted to continue with the procedure. Attestation: I, the ordering provider, attest that I have discussed with the patient the benefits, risks, side-effects, alternatives, likelihood of achieving goals, and potential problems during recovery for the procedure that I have provided informed consent. Date  Time: 08/20/2022 10:02 AM   Pre-Procedure Preparation:  Monitoring: As per clinic protocol. Respiration, ETCO2, SpO2, BP, heart rate and rhythm monitor placed and checked for adequate function Safety Precautions: Patient was assessed for positional comfort and pressure points before starting the procedure. Time-out: I initiated and conducted the "Time-out" before starting the procedure, as per protocol. The patient was asked to participate by confirming the accuracy of the "Time Out" information. Verification of the correct person, site, and procedure were performed and confirmed by me, the nursing staff, and the patient. "Time-out" conducted as per Joint Commission's Universal  Protocol (UP.01.01.01). Time: 1054 Start Time: 1054 hrs.  Description/Narrative of Procedure:          Laterality: See above. Targeted Levels: See above.  Rationale (medical necessity): procedure needed and proper for the diagnosis and/or treatment of the patient's medical symptoms and needs. Procedural Technique Safety Precautions: Aspiration looking for blood return was conducted prior to all injections. At no point did we inject any substances, as a needle was being advanced. No attempts were made at seeking any paresthesias. Safe injection practices and needle disposal techniques used. Medications properly checked for  expiration dates. SDV (single dose vial) medications used. Description of the Procedure: Protocol guidelines were followed. The patient was assisted into a comfortable position. The target area was identified and the area prepped in the usual manner. Skin & deeper tissues infiltrated with local anesthetic. Appropriate amount of time allowed to pass for local anesthetics to take effect. The procedure needles were then advanced to the target area. Proper needle placement secured. Negative aspiration confirmed. Solution injected in intermittent fashion, asking for systemic symptoms every 0.5cc of injectate. The needles were then removed and the area cleansed, making sure to leave some of the prepping solution back to take advantage of its long term bactericidal properties.  Technical description of process:  Third Occipital Nerve (TON) Block (MBB): The target area for the TON branch is the postero-lateral aspect of the C2-C3 articulation. Under fluoroscopic guidance, a Quincke needle was inserted until contact was made with os over the target area. After negative aspiration for blood, 1mL of the nerve block solution was injected without difficulty or complication. The needle was removed intact. C3 Medial Branch Nerve Block (MBB): The target area for the C3 dorsal medial articular branch  is the lateral concave waist of the articular pillar of C3. Under fluoroscopic guidance, a Quincke needle was inserted until contact was made with os over the postero-lateral aspect of the articular pillar of C3 (target area). After negative aspiration for blood,15mL of the nerve block solution was injected without difficulty or complication. The needle was removed intact. C4 Medial Branch Nerve Block (MBB): The target area for the C4 dorsal medial articular branch is the lateral concave waist of the articular pillar of C4. Under fluoroscopic guidance, a Quincke needle was inserted until contact was made with os over the postero-lateral aspect of the articular pillar of C4 (target area). After negative aspiration for blood, 1 mL of the nerve block solution was injected without difficulty or complication. The needle was removed intact.   Once the entire procedure was completed, the treated area was cleaned, making sure to leave some of the prepping solution back to take advantage of its long term bactericidal properties.  Anatomy Reference Guide:      Facet Joint Innervation  C1-2 Third occipital Nerve (TON)  C2-3 TON, C3  Medial Branch  C3-4 C3, C4         "          "  C4-5 C4, C5         "          "  C5-6 C5, C6         "          "  C6-7 C6, C7         "          "  C7-T1 C7, C8         "          "   Cervical Facet Pain Pattern overlap:   Vitals:   08/20/22 1047 08/20/22 1052 08/20/22 1057 08/20/22 1101  BP: 127/68 100/77 114/73 125/68  Pulse:      Resp: 18 17 15 16   Temp:      SpO2: 97% 98% 96% 98%  Weight:      Height:         Start Time: 1054 hrs. End Time: 1100 hrs.  Imaging Guidance (Spinal):          Type of Imaging Technique: Fluoroscopy Guidance (Spinal)  Indication(s): Assistance in needle guidance and placement for procedures requiring needle placement in or near specific anatomical locations not easily accessible without such assistance. Exposure Time: Please see  nurses notes. Contrast: None used. Fluoroscopic Guidance: I was personally present during the use of fluoroscopy. "Tunnel Vision Technique" used to obtain the best possible view of the target area. Parallax error corrected before commencing the procedure. "Direction-depth-direction" technique used to introduce the needle under continuous pulsed fluoroscopy. Once target was reached, antero-posterior, oblique, and lateral fluoroscopic projection used confirm needle placement in all planes. Images permanently stored in EMR. Interpretation: No contrast injected. I personally interpreted the imaging intraoperatively. Adequate needle placement confirmed in multiple planes. Permanent images saved into the patient's record.  Post-operative Assessment:  Post-procedure Vital Signs:  Pulse/HCG Rate: 9290 Temp: 98.1 F (36.7 C) Resp: 16 BP: 125/68 SpO2: 98 %  EBL: None  Complications: No immediate post-treatment complications observed by team, or reported by patient.  Note: The patient tolerated the entire procedure well. A repeat set of vitals were taken after the procedure and the patient was kept under observation following institutional policy, for this type of procedure. Post-procedural neurological assessment was performed, showing return to baseline, prior to discharge. The patient was provided with post-procedure discharge instructions, including a section on how to identify potential problems. Should any problems arise concerning this procedure, the patient was given instructions to immediately contact us, at any time, without hesitation. In any case, we plan to contact the patient by telephone for a follow-up status report regarding this interventional procedure.  Comments:  No additional relevant information.  Plan of Care (POC)  Orders:  Orders Placed This Encounter  Procedures   DG PAIN CLINIC C-ARM 1-60 MIN NO REPORT    Intraoperative interpretation by procedural physician at [facility].    Standing Status:   Standing    Number of Occurrences:   1    Order Specific Question:   Reason for exam:    Answer:   Assistance in needle guidance and placement for procedures requiring needle placement in or near specific anatomical locations not easily accessible without such assistance.     Medications ordered for procedure: Meds ordered this encounter  Medications   lidocaine (XYLOCAINE) 2 % (with pres) injection 400 mg   diazepam (VALIUM) tablet 5 mg    Make sure Flumazenil is available in the pyxis when using this medication. If oversedation occurs, administer 0.2 mg IV over 15 sec. If after 45 sec no response, administer 0.2 mg again over 1 min; may repeat at 1 min intervals; not to exceed 4 doses (1 mg)   dexamethasone (DECADRON) injection 10 mg   ropivacaine (PF) 2 mg/mL (0.2%) (NAROPIN) injection 9 mL   Medications administered: We administered lidocaine, diazepam, dexamethasone, and ropivacaine (PF) 2 mg/mL (0.2%).  See the medical record for exact dosing, route, and time of administration.  Follow-up plan:   Return in about 4 weeks (around 09/17/2022) for VV PPE.       Right TON, C3, C4 MBNB 08/20/22    Recent Visits Date Type Provider Dept  07/29/22 Office Visit Edward Jolly, MD [facility]  Showing recent visits within past 90 days and meeting all other requirements Today's Visits Date Type Provider Dept  08/20/22 Procedure visit Edward Jolly, MD [facility]  Showing today's visits and meeting all other requirements Future Appointments Date Type Provider Dept  09/17/22 Appointment Edward Jolly, MD [facility]  Showing future appointments within  next 90 days and meeting all other requirements  Disposition: Discharge home  Discharge (Date  Time): 08/20/2022; 1110 hrs.   Primary Care Physician: Sherlene Shams, MD Location: [facility] Note by: Edward Jolly, MD (TTS technology used.  I apologize for any typographical errors that were not detected and corrected.) Date: 08/20/2022; Time: 11:50 AM  Disclaimer:  Medicine is not an Visual merchandiser. The only guarantee in medicine is that nothing is guaranteed. It is important to note that the decision to proceed with this intervention was based on the information collected from the patient. The Data and conclusions were drawn from the patient's questionnaire, the interview, and the physical examination. Because the information was provided in large part by the patient, it cannot be guaranteed that it has not been purposely or unconsciously manipulated. Every effort has been made to obtain as much relevant data as possible for this evaluation. It is important to note that the conclusions that lead to this procedure are derived in large part from the available data. Always take into account that the treatment will also be dependent on availability of resources and existing treatment guidelines, considered by other Pain Management Practitioners as being common knowledge and practice, at the time of the intervention. For Medico-Legal purposes, it is also important to point out that variation in procedural techniques and pharmacological choices are the acceptable norm. The indications, contraindications, technique, and results of the above procedure should only be interpreted and judged by a Board-Certified Interventional Pain Specialist with extensive familiarity and expertise in the same exact procedure and technique.

## 2022-08-21 ENCOUNTER — Telehealth: Payer: Self-pay | Admitting: *Deleted

## 2022-08-21 NOTE — Telephone Encounter (Signed)
Attempted to call for post procedure follow-up. Message left. 

## 2022-08-25 ENCOUNTER — Encounter: Payer: Self-pay | Admitting: Internal Medicine

## 2022-08-26 MED ORDER — VENLAFAXINE HCL ER 150 MG PO CP24: ORAL_CAPSULE | ORAL | 1 refills | Status: DC

## 2022-08-29 ENCOUNTER — Encounter: Payer: Self-pay | Admitting: Internal Medicine

## 2022-09-08 ENCOUNTER — Encounter: Payer: Self-pay | Admitting: Cardiovascular Disease

## 2022-09-08 ENCOUNTER — Other Ambulatory Visit: Payer: Self-pay | Admitting: Internal Medicine

## 2022-09-08 ENCOUNTER — Other Ambulatory Visit: Payer: Self-pay

## 2022-09-08 MED ORDER — ROSUVASTATIN CALCIUM 5 MG PO TABS: 5.0000 mg | ORAL_TABLET | Freq: Every day | ORAL | 3 refills | Status: DC

## 2022-09-08 NOTE — Telephone Encounter (Signed)
Not in pt's current medication list. Looks like is was discontinued on 03/10/2022.

## 2022-09-10 ENCOUNTER — Encounter: Payer: Self-pay | Admitting: Internal Medicine

## 2022-09-11 MED ORDER — DULOXETINE HCL 30 MG PO CPEP: 30.0000 mg | ORAL_CAPSULE | Freq: Every day | ORAL | 1 refills | Status: DC

## 2022-09-15 ENCOUNTER — Ambulatory Visit: Payer: 59 | Attending: Internal Medicine

## 2022-09-15 ENCOUNTER — Telehealth: Payer: Self-pay

## 2022-09-15 DIAGNOSIS — Z95810 Presence of automatic (implantable) cardiac defibrillator: Secondary | ICD-10-CM

## 2022-09-15 DIAGNOSIS — I5022 Chronic systolic (congestive) heart failure: Secondary | ICD-10-CM

## 2022-09-15 NOTE — Telephone Encounter (Signed)
Remote ICM transmission received.  Attempted call to patient regarding ICM remote transmission and left detailed message per DPR with ICM phone number to return call.  Left ICM phone number and advised to return call for any fluid symptoms or questions.

## 2022-09-15 NOTE — Progress Notes (Signed)
EPIC Encounter for ICM Monitoring  Patient Name: Suzanne Spears is a 62 y.o. female Date: 09/15/2022 Primary Care Physican: Sherlene Shams, MD Primary Cardiologist: Mariah Milling Electrophysiologist: Joycelyn Schmid Pacing:  98.4%    06/04/2022 Weight: 159 lbs 07/25/2022 Office Weight: 165 lbs            Attempted call to patient and unable to reach.  Left detailed message per DPR regarding transmission. Transmission reviewed.    Diet: Not following low salt diet   OptiVol Thoracic impedance suggesting possible fluid accumulation starting 7/1.    Prescribed:  Furosemide 20 mg Take1 tablet (20 mg) once daily. May take extra 20 mg as needed for weight gain or swelling.   Spironolactone 25 mg take 1 tablet daily   Labs: 08/14/2022 Creatinine 1.36, BUN 26, Potassium 4.2, Sodium 138, GFR 44  07/25/2022 Creatinine 1.25, BUN 20, Potassium 4.7, Sodium 138, GFR 49  A complete set of results can be found in Results Review.   Recommendations:  Left voice mail with ICM number and encouraged to call if experiencing any fluid symptoms.   Follow-up plan: ICM clinic phone appointment on 09/22/2022 (manual) to recheck fluid levels.  91 day device clinic remote transmission 10/23/2022.     EP/Cardiology Office Visits:  01/29/2023 with Dr Graciela Husbands.   Copy of ICM check sent to Dr. Graciela Husbands.   Will send copy to Dr Mariah Milling for review if patient is reached.   3 month ICM trend: 09/15/2022.    12-14 Month ICM trend:     Karie Soda, RN 09/15/2022 12:43 PM

## 2022-09-16 ENCOUNTER — Encounter: Payer: Self-pay | Admitting: Student in an Organized Health Care Education/Training Program

## 2022-09-17 ENCOUNTER — Ambulatory Visit
Payer: 59 | Attending: Student in an Organized Health Care Education/Training Program | Admitting: Student in an Organized Health Care Education/Training Program

## 2022-09-17 DIAGNOSIS — M47812 Spondylosis without myelopathy or radiculopathy, cervical region: Secondary | ICD-10-CM

## 2022-09-17 DIAGNOSIS — G894 Chronic pain syndrome: Secondary | ICD-10-CM | POA: Diagnosis not present

## 2022-09-17 NOTE — Progress Notes (Signed)
Patient: Suzanne Spears  Service Category: E/M  Provider: Edward Jolly, MD  DOB: [birth year]  DOS: 09/17/2022  Location: Office  MRN: [id number]  Setting: Ambulatory outpatient  Referring Provider: Sherlene Shams, MD  Type: Established Patient  Specialty: Interventional Pain Management  PCP: Suzanne Shams, MD  Location: Remote location  Delivery: TeleHealth     Virtual Encounter - Pain Management PROVIDER NOTE: Information contained herein reflects review and annotations entered in association with encounter. Interpretation of such information and data should be left to medically-trained personnel. Information provided to patient can be located elsewhere in the medical record under "Patient Instructions". Document created using STT-dictation technology, any transcriptional errors that may result from process are unintentional.    Contact & Pharmacy Preferred: [phone] Home: [phone] (home) Mobile: [phone] (mobile) E-mail: [email or web address]  [facility] - [place], [place] - [address] [place] [address] Phone: [phone] Fax: [phone]  [facility][id number] [place], [place] - [address] AT [facility] [address] [place] [address] Phone: [phone] Fax: [phone]  [facility][id number] [place], [place] - [address] AT [facility] [address] [facility] [address] [place] [address] Phone: [phone] Fax: [phone]  [facility] #[id number] - [address] [place] - [address] AT [facility] [address] [place] [address] Phone: [phone] Fax: [phone]   Pre-screening  [name] offered "in-person" vs "virtual" encounter. She indicated preferring virtual for this encounter.   Reason COVID-19*  Social distancing based on CDC and AMA recommendations.   I contacted Suzanne Spears on 09/17/2022 via telephone.      I clearly identified myself as Edward Jolly,  MD. I verified that I was speaking with the correct person using two identifiers (Name: Suzanne Spears, and date of birth: [birth year]).  Consent I sought verbal advanced consent from Suzanne Spears for virtual visit interactions. I informed Suzanne Spears of possible security and privacy concerns, risks, and limitations associated with providing "not-in-person" medical evaluation and management services. I also informed [name] of the availability of "in-person" appointments. Finally, I informed her that there would be a charge for the virtual visit and that she could be  personally, fully or partially, financially responsible for it. Suzanne Spears expressed understanding and agreed to proceed.   Historic Elements   [name] is a 62 y.o. year old, female patient evaluated today after our last contact on 08/20/2022. Suzanne Spears  has a past medical history of AICD (automatic cardioverter/defibrillator) present, Allergy, Anemia, Asthma, Breast calcifications on mammogram (06/21/2019), Cardiomyopathy, dilated, nonischemic (HCC), CKD (chronic kidney disease), stage III (HCC), Cough, Depression, Dizziness, GERD (gastroesophageal reflux disease), Heart failure with mid range ejection fraction (HFmrEF) (HCC), HFrEF (heart failure with reduced ejection fraction) (HCC), Hip pain, right (03/1998), History of 2019 novel coronavirus disease (COVID-19) (03/11/2019), History of bronchitis, History of shingles, Hyperlipidemia, Hypertension, Left bundle branch block (2008), Migraine, Mitral valve prolapse syndrome, Pericarditis (2008), Peripheral neuropathy, Pneumonia (2016), Presence of permanent cardiac pacemaker, and Spinal headache. She also  has a past surgical history that includes Cesarean section; Appendectomy (2009); turbinectomy (2009); ganglionic cyst (remote); tendon release surgery (Left); Colonoscopy; Tonsillectomy; Anterior cervical decomp/discectomy fusion (N/A, 10/21/2012); bi-ventricular implantable  cardioverter defibrillator (N/A, 06/15/2014); blood clot removed from left top hand; Thoracotomy (Left, 11/30/2014); Epicardial pacing lead placement (N/A, 11/30/2014); Cardiac catheterization (01/13/05/2015); Cardiac catheterization;  Esophagogastroduodenoscopy (egd) with propofol (N/A, 04/07/2017); Breast biopsy (Left, 12/17/2018); Breast biopsy (Left, 12/17/2018); Colonoscopy with propofol (N/A, 07/12/2021); and ICD GENERATOR CHANGEOUT (N/A, 10/24/2021). Suzanne Spears has a current medication list which includes the following prescription(s): baclofen, cetirizine, cyanocobalamin, dapagliflozin propanediol, duloxetine, elderberry, entresto, ezetimibe, fluticasone, fremanezumab-vfrm, furosemide, gabapentin, multiple minerals-vitamins, rosuvastatin, spironolactone, syringe 3cc/25gx1", tramadol, trazodone, ubrelvy, valacyclovir, and venlafaxine xr. She  reports that she has never smoked. She has never used smokeless tobacco. She reports current alcohol use. She reports that she does not use drugs. Suzanne Spears is allergic to adhesive [tape], carvedilol, metoprolol, penicillin g, lisinopril, other, prednisone, latex, and levofloxacin.  BMI: Estimated body mass index is 27.12 kg/m as calculated from the following:   Height as of 08/20/22: 5\' 5"  (1.651 m).   Weight as of 08/20/22: 163 lb (73.9 kg). Last encounter: 07/29/2022. Last procedure: 08/20/2022.  HPI  Today, she is being contacted for a post-procedure assessment.   Post-procedure evaluation   Procedure: Cervical Facet Medial Branch Block(s) #1  Laterality: Right  Level: TON, C3, and C4 Medial Branch Level(s). Injecting these levels blocks the C2-3 and C3-4 cervical facet joints.  Imaging: Fluoroscopic guidance Anesthesia: Local anesthesia (1-2% Lidocaine) Anxiolysis: Valium 5 mg PO DOS: 08/20/2022  Performed by: Edward Jolly, MD  Purpose: Diagnostic/Therapeutic Indications: Cervicalgia (cervical spine axial pain) severe enough to impact quality of life or  function. 1. Cervical facet joint syndrome   2. Cervical spondylosis   3. Chronic pain syndrome    NAS-11 Pain score:   Pre-procedure: 7 /10   Post-procedure: 2 /10      Effectiveness:  Initial hour after procedure: 100 %  Subsequent 4-6 hours post-procedure: 100 %  Analgesia past initial 6 hours: 90% Ongoing improvement:  Analgesic:  60% Function: Somewhat improved ROM: Somewhat improved   Laboratory Chemistry Profile   Renal Lab Results  Component Value Date   BUN 26 (H) 08/14/2022   CREATININE 1.36 (H) 08/14/2022   BCR 13 07/17/2020   GFR 44.94 (L) 10/28/2018   GFRAA 54 (L) 11/04/2019   GFRNONAA 44 (L) 08/14/2022    Hepatic Lab Results  Component Value Date   AST 25 01/30/2022   ALT 18 01/30/2022   ALBUMIN 4.2 01/30/2022   ALKPHOS 97 01/30/2022   HCVAB NEGATIVE 08/17/2015    Electrolytes Lab Results  Component Value Date   NA 138 08/14/2022   K 4.2 08/14/2022   CL 101 08/14/2022   CALCIUM 9.1 08/14/2022   MG 1.8 05/02/2020    Bone Lab Results  Component Value Date   VD25OH 43.92 12/08/2013    Inflammation (CRP: Acute Phase) (ESR: Chronic Phase) Lab Results  Component Value Date   CRP <1.0 06/05/2022   ESRSEDRATE 18 06/05/2022         Note: Above Lab results reviewed.   Assessment  The primary encounter diagnosis was Cervical facet joint syndrome. Diagnoses of Cervical spondylosis and Chronic pain syndrome were also pertinent to this visit.  Plan of Care  Patient is status post right third occipital nerve, C3, C4 cervical facet medial branch nerve block that provided her with significant pain relief.  She states that the top portion of her cervical region is doing much better and she is in less pain.  She is endorsing pain further down in her cervical spine that is worse with cervical extension.  We discussed repeating second diagnostic cervical facet medial branch nerve block however addressing lower medial branch levels at C4, C5, C6, C7.  Risk  and  benefits reviewed and patient would like to proceed.  Orders:  Orders Placed This Encounter  Procedures   CERVICAL FACET (MEDIAL BRANCH NERVE BLOCK)     Standing Status:   Future    Standing Expiration Date:   12/18/2022    Scheduling Instructions:     Side: RIGHT     Level: C4-5, C5-6, and C6-7 Facet joints  C4, C5, C6, and C7 Medial Branch Nerves)     Sedation: PO Valium     Timeframe: As soon as schedule allows    Order Specific Question:   Where will this procedure be performed?    Answer:   [facility]   Follow-up plan:   Return in about 2 weeks (around 10/01/2022) for Right C4, 5, 6, 7 MBNB, in clinic (PO Valium).      Right TON, C3, C4 MBNB 08/20/22     Recent Visits Date Type Provider Dept  08/20/22 Procedure visit Edward Jolly, MD [facility]  07/29/22 Office Visit Edward Jolly, MD [facility]  Showing recent visits within past 90 days and meeting all other requirements Today's Visits Date Type Provider Dept  09/17/22 Office Visit Edward Jolly, MD [facility]  Showing today's visits and meeting all other requirements Future Appointments No visits were found meeting these conditions. Showing future appointments within next 90 days and meeting all other requirements  I discussed the assessment and treatment plan with the patient. The patient was provided an opportunity to ask questions and all were answered. The patient agreed with the plan and demonstrated an understanding of the instructions.  Patient advised to call back or seek an in-person evaluation if the symptoms or condition worsens.  Duration of encounter: .  Note by: Edward Jolly, MD Date: 09/17/2022; Time: 10:50 AM

## 2022-09-22 ENCOUNTER — Ambulatory Visit: Payer: 59

## 2022-09-22 DIAGNOSIS — I5022 Chronic systolic (congestive) heart failure: Secondary | ICD-10-CM

## 2022-09-22 DIAGNOSIS — Z95810 Presence of automatic (implantable) cardiac defibrillator: Secondary | ICD-10-CM

## 2022-09-22 NOTE — Progress Notes (Signed)
EPIC Encounter for ICM Monitoring  Patient Name: Suzanne Spears is a 62 y.o. female Date: 09/22/2022 Primary Care Physican: Sherlene Shams, MD Primary Cardiologist: Mariah Milling Electrophysiologist: Joycelyn Schmid Pacing:  98.5%    06/04/2022 Weight: 159 lbs 07/25/2022 Office Weight: 165 lbs            Transmission reviewed.    Diet: Not following low salt diet   OptiVol Thoracic impedance suggesting fluid levels returned to normal.    Prescribed:  Furosemide 20 mg Take1 tablet (20 mg) once daily. May take extra 20 mg as needed for weight gain or swelling.   Spironolactone 25 mg take 1 tablet daily   Labs: 08/14/2022 Creatinine 1.36, BUN 26, Potassium 4.2, Sodium 138, GFR 44  07/25/2022 Creatinine 1.25, BUN 20, Potassium 4.7, Sodium 138, GFR 49  A complete set of results can be found in Results Review.   Recommendations:  No changes.    Follow-up plan: ICM clinic phone appointment on 10/20/2022.  91 day device clinic remote transmission 10/23/2022.     EP/Cardiology Office Visits:  01/29/2023 with Dr Graciela Husbands.   Copy of ICM check sent to Dr. Graciela Husbands.     3 month ICM trend: 09/22/2022.    12-14 Month ICM trend:     Karie Soda, RN 09/22/2022 4:02 PM

## 2022-10-06 ENCOUNTER — Ambulatory Visit
Payer: No Typology Code available for payment source | Attending: Student in an Organized Health Care Education/Training Program | Admitting: Student in an Organized Health Care Education/Training Program

## 2022-10-06 ENCOUNTER — Encounter: Payer: Self-pay | Admitting: Student in an Organized Health Care Education/Training Program

## 2022-10-06 ENCOUNTER — Ambulatory Visit
Admission: RE | Admit: 2022-10-06 | Discharge: 2022-10-06 | Disposition: A | Discharge status: Home / Self Care | Payer: No Typology Code available for payment source | Source: Ambulatory Visit | Attending: Student in an Organized Health Care Education/Training Program | Admitting: Student in an Organized Health Care Education/Training Program

## 2022-10-06 DIAGNOSIS — G894 Chronic pain syndrome: Secondary | ICD-10-CM | POA: Diagnosis present

## 2022-10-06 DIAGNOSIS — M47812 Spondylosis without myelopathy or radiculopathy, cervical region: Secondary | ICD-10-CM

## 2022-10-06 MED ORDER — ROPIVACAINE HCL 2 MG/ML IJ SOLN
9.0000 mL | Freq: Once | INTRAMUSCULAR | Status: AC
  Administered 2022-10-06: 20 mL via PERINEURAL

## 2022-10-06 MED ORDER — DEXAMETHASONE SODIUM PHOSPHATE 10 MG/ML IJ SOLN
10.0000 mg | Freq: Once | INTRAMUSCULAR | Status: AC
  Administered 2022-10-06: 10 mg
  Filled 2022-10-06: qty 1

## 2022-10-06 MED ORDER — ROPIVACAINE HCL 2 MG/ML IJ SOLN
INTRAMUSCULAR | Status: AC
  Filled 2022-10-06: qty 20

## 2022-10-06 MED ORDER — LIDOCAINE HCL 2 % IJ SOLN
20.0000 mL | Freq: Once | INTRAMUSCULAR | Status: AC
  Administered 2022-10-06: 400 mg
  Filled 2022-10-06: qty 20

## 2022-10-06 MED ORDER — DIAZEPAM 5 MG PO TABS
5.0000 mg | ORAL_TABLET | ORAL | Status: AC
  Administered 2022-10-06: 5 mg via ORAL

## 2022-10-06 MED ORDER — DIAZEPAM 5 MG PO TABS
ORAL_TABLET | ORAL | Status: AC
  Filled 2022-10-06: qty 1

## 2022-10-06 NOTE — Progress Notes (Signed)
PROVIDER NOTE: Interpretation of information contained herein should be left to medically-trained personnel. Specific patient instructions are provided elsewhere under "Patient Instructions" section of medical record. This document was created in part using STT-dictation technology, any transcriptional errors that may result from this process are unintentional.  Patient: Suzanne Spears Type: Established DOB: March 18, 1960 MRN: [id number] PCP: Sherlene Shams, MD  Service: Procedure DOS: 10/06/2022 Setting: Ambulatory Location: Ambulatory outpatient facility Delivery: Face-to-face Provider: Edward Jolly, MD Specialty: Interventional Pain Management Specialty designation: 09 Location: Outpatient facility Ref. Prov.: Edward Jolly, MD       Interventional Therapy   Procedure: Cervical Facet Medial Branch Block(s) #1  Laterality: Right  Level: C4, C5, C6, and C7 Medial Branch Level(s). Injecting these levels blocks the C4-5, C5-6, and C6-7 cervical facet joints.  Imaging: Fluoroscopic guidance Anesthesia: Local anesthesia (1-2% Lidocaine) Anxiolysis: PO Valium DOS: 10/06/2022  Performed by: Edward Jolly, MD  Purpose: Diagnostic/Therapeutic Indications: Cervicalgia (cervical spine axial pain) severe enough to impact quality of life or function. 1. Cervical facet joint syndrome   2. Cervical spondylosis   3. Chronic pain syndrome    NAS-11 Pain score:   Pre-procedure: 4 /10   Post-procedure: 4 /10     Position / Prep / Materials:  Position: Prone. Head in cradle. C-spine slightly flexed. Prep solution: DuraPrep (Iodine Povacrylex [0.7% available iodine] and Isopropyl Alcohol, 74% w/w) Prep Area: Posterior Cervico-thoracic Region. From occipital ridge to tip of scapula, and from shoulder to shoulder. Entire posterior and lateral neck surface. Materials:  Tray: Block Needle(s):  Type: Spinal  Gauge (G): 22  Length: 3.5-in  Qty:  2      H&P (Pre-op Assessment):  Suzanne Spears is a 62  y.o. (year old), female patient, seen today for interventional treatment. She  has a past surgical history that includes Cesarean section; Appendectomy (2009); turbinectomy (2009); ganglionic cyst (remote); tendon release surgery (Left); Colonoscopy; Tonsillectomy; Anterior cervical decomp/discectomy fusion (N/A, 10/21/2012); bi-ventricular implantable cardioverter defibrillator (N/A, 06/15/2014); blood clot removed from left top hand; Thoracotomy (Left, 11/30/2014); Epicardial pacing lead placement (N/A, 11/30/2014); Cardiac catheterization (01/13/05/2015); Cardiac catheterization; Esophagogastroduodenoscopy (egd) with propofol (N/A, 04/07/2017); Breast biopsy (Left, 12/17/2018); Breast biopsy (Left, 12/17/2018); Colonoscopy with propofol (N/A, 07/12/2021); and ICD GENERATOR CHANGEOUT (N/A, 10/24/2021). Suzanne Spears has a current medication list which includes the following prescription(s): baclofen, cetirizine, cyanocobalamin, dapagliflozin propanediol, duloxetine, elderberry, entresto, ezetimibe, fluticasone, fremanezumab-vfrm, furosemide, gabapentin, multiple minerals-vitamins, rosuvastatin, spironolactone, syringe 3cc/25gx1", tramadol, trazodone, ubrelvy, valacyclovir, and venlafaxine xr. Her primarily concern today is the Neck Pain  Initial Vital Signs:  Pulse/HCG Rate: 81ECG Heart Rate: 87 Temp: (!) 97.2 F (36.2 C) Resp: 16 BP: 111/66 SpO2: 100 %  BMI: Estimated body mass index is 28.46 kg/m as calculated from the following:   Height as of this encounter: 5\' 5"  (1.651 m).   Weight as of this encounter: 171 lb (77.6 kg).  Risk Assessment: Allergies: Reviewed. She is allergic to adhesive [tape], carvedilol, metoprolol, penicillin g, lisinopril, other, prednisone, latex, and levofloxacin.  Allergy Precautions: None required Coagulopathies: Reviewed. None identified.  Blood-thinner therapy: None at this time Active Infection(s): Reviewed. None identified. Ms. Merlo is afebrile  Site Confirmation: Ms.  Spears was asked to confirm the procedure and laterality before marking the site Procedure checklist: Completed Consent: Before the procedure and under the influence of no sedative(s), amnesic(s), or anxiolytics, the patient was informed of the treatment options, risks and possible complications. To fulfill our ethical and legal obligations, as recommended by the American Medical Association's Code  of Ethics, I have informed the patient of my clinical impression; the nature and purpose of the treatment or procedure; the risks, benefits, and possible complications of the intervention; the alternatives, including doing nothing; the risk(s) and benefit(s) of the alternative treatment(s) or procedure(s); and the risk(s) and benefit(s) of doing nothing. The patient was provided information about the general risks and possible complications associated with the procedure. These may include, but are not limited to: failure to achieve desired goals, infection, bleeding, organ or nerve damage, allergic reactions, paralysis, and death. In addition, the patient was informed of those risks and complications associated to Spine-related procedures, such as failure to decrease pain; infection (i.e.: Meningitis, epidural or intraspinal abscess); bleeding (i.e.: epidural hematoma, subarachnoid hemorrhage, or any other type of intraspinal or peri-dural bleeding); organ or nerve damage (i.e.: Any type of peripheral nerve, nerve root, or spinal cord injury) with subsequent damage to sensory, motor, and/or autonomic systems, resulting in permanent pain, numbness, and/or weakness of one or several areas of the body; allergic reactions; (i.e.: anaphylactic reaction); and/or death. Furthermore, the patient was informed of those risks and complications associated with the medications. These include, but are not limited to: allergic reactions (i.e.: anaphylactic or anaphylactoid reaction(s)); adrenal axis suppression; blood sugar  elevation that in diabetics may result in ketoacidosis or comma; water retention that in patients with history of congestive heart failure may result in shortness of breath, pulmonary edema, and decompensation with resultant heart failure; weight gain; swelling or edema; medication-induced neural toxicity; particulate matter embolism and blood vessel occlusion with resultant organ, and/or nervous system infarction; and/or aseptic necrosis of one or more joints. Finally, the patient was informed that Medicine is not an exact science; therefore, there is also the possibility of unforeseen or unpredictable risks and/or possible complications that may result in a catastrophic outcome. The patient indicated having understood very clearly. We have given the patient no guarantees and we have made no promises. Enough time was given to the patient to ask questions, all of which were answered to the patient's satisfaction. Ms. Pavlovsky has indicated that she wanted to continue with the procedure. Attestation: I, the ordering provider, attest that I have discussed with the patient the benefits, risks, side-effects, alternatives, likelihood of achieving goals, and potential problems during recovery for the procedure that I have provided informed consent. Date  Time: 10/06/2022  9:01 AM   Pre-Procedure Preparation:  Monitoring: As per clinic protocol. Respiration, ETCO2, SpO2, BP, heart rate and rhythm monitor placed and checked for adequate function Safety Precautions: Patient was assessed for positional comfort and pressure points before starting the procedure. Time-out: I initiated and conducted the "Time-out" before starting the procedure, as per protocol. The patient was asked to participate by confirming the accuracy of the "Time Out" information. Verification of the correct person, site, and procedure were performed and confirmed by me, the nursing staff, and the patient. "Time-out" conducted as per Joint  Commission's Universal Protocol (UP.01.01.01). Time: 0938 Start Time: 0938 hrs.  Description/Narrative of Procedure:          Laterality: See above. Targeted Levels: See above.  Rationale (medical necessity): procedure needed and proper for the diagnosis and/or treatment of the patient's medical symptoms and needs. Procedural Technique Safety Precautions: Aspiration looking for blood return was conducted prior to all injections. At no point did we inject any substances, as a needle was being advanced. No attempts were made at seeking any paresthesias. Safe injection practices and needle disposal techniques used. Medications properly  checked for expiration dates. SDV (single dose vial) medications used. Description of the Procedure: Protocol guidelines were followed. The patient was assisted into a comfortable position. The target area was identified and the area prepped in the usual manner. Skin & deeper tissues infiltrated with local anesthetic. Appropriate amount of time allowed to pass for local anesthetics to take effect. The procedure needles were then advanced to the target area. Proper needle placement secured. Negative aspiration confirmed. Solution injected in intermittent fashion, asking for systemic symptoms every 0.5cc of injectate. The needles were then removed and the area cleansed, making sure to leave some of the prepping solution back to take advantage of its long term bactericidal properties.  Technical description of process:   C4 Medial Branch Nerve Block (MBB): The target area for the C4 dorsal medial articular branch is the lateral concave waist of the articular pillar of C4. Under fluoroscopic guidance, a Quincke needle was inserted until contact was made with os over the postero-lateral aspect of the articular pillar of C4 (target area). After negative aspiration for blood, 2mL of the nerve block solution was injected without difficulty or complication. The needle was removed  intact. C5 Medial Branch Nerve Block (MBB): The target area for the C5 dorsal medial articular branch is the lateral concave waist of the articular pillar of C5. Under fluoroscopic guidance, a Quincke needle was inserted until contact was made with os over the postero-lateral aspect of the articular pillar of C5 (target area). After negative aspiration for blood,38mL of the nerve block solution was injected without difficulty or complication. The needle was removed intact. C6 Medial Branch Nerve Block (MBB): The target area for the C6 dorsal medial articular branch is the lateral concave waist of the articular pillar of C6. Under fluoroscopic guidance, a Quincke needle was inserted until contact was made with os over the postero-lateral aspect of the articular pillar of C6 (target area). After negative aspiration for blood, 2mL of the nerve block solution was injected without difficulty or complication. The needle was removed intact. C7 Medial Branch Nerve Block (MBB): The target for the C7 dorsal medial articular branch lies on the superior-lateral tip of the C7 transverse process. Under fluoroscopic guidance, a Quincke needle was inserted until contact was made with os over the postero-lateral aspect of the articular pillar of C7 (target area). After negative aspiration for blood, 2mL of the nerve block solution was injected without difficulty or complication. The needle was removed intact.   Once the entire procedure was completed, the treated area was cleaned, making sure to leave some of the prepping solution back to take advantage of its long term bactericidal properties.  Anatomy Reference Guide:      Facet Joint Innervation  C1-2 Third occipital Nerve (TON)  C2-3 TON, C3  Medial Branch  C3-4 C3, C4         "          "  C4-5 C4, C5         "          "  C5-6 C5, C6         "          "  C6-7 C6, C7         "          "  C7-T1 C7, C8         "          "   Cervical Facet Pain  Pattern  overlap:   Vitals:   10/06/22 0907 10/06/22 0943 10/06/22 0945  BP: 111/66 130/70 122/71  Pulse: 81    Resp: 16 16 19   Temp: (!) 97.2 F (36.2 C)    SpO2: 100% 100% 100%  Weight: 171 lb (77.6 kg)    Height: 5\' 5"  (1.651 m)       Start Time: 0938 hrs. End Time: 0944 hrs.  Imaging Guidance (Spinal):          Type of Imaging Technique: Fluoroscopy Guidance (Spinal) Indication(s): Assistance in needle guidance and placement for procedures requiring needle placement in or near specific anatomical locations not easily accessible without such assistance. Exposure Time: Please see nurses notes. Contrast: None used. Fluoroscopic Guidance: I was personally present during the use of fluoroscopy. "Tunnel Vision Technique" used to obtain the best possible view of the target area. Parallax error corrected before commencing the procedure. "Direction-depth-direction" technique used to introduce the needle under continuous pulsed fluoroscopy. Once target was reached, antero-posterior, oblique, and lateral fluoroscopic projection used confirm needle placement in all planes. Images permanently stored in EMR. Interpretation: No contrast injected. I personally interpreted the imaging intraoperatively. Adequate needle placement confirmed in multiple planes. Permanent images saved into the patient's record.  Post-operative Assessment:  Post-procedure Vital Signs:  Pulse/HCG Rate: 8186 Temp: (!) 97.2 F (36.2 C) Resp: 19 BP: 122/71 SpO2: 100 %  EBL: None  Complications: No immediate post-treatment complications observed by team, or reported by patient.  Note: The patient tolerated the entire procedure well. A repeat set of vitals were taken after the procedure and the patient was kept under observation following institutional policy, for this type of procedure. Post-procedural neurological assessment was performed, showing return to baseline, prior to discharge. The patient was provided with  post-procedure discharge instructions, including a section on how to identify potential problems. Should any problems arise concerning this procedure, the patient was given instructions to immediately contact us, at any time, without hesitation. In any case, we plan to contact the patient by telephone for a follow-up status report regarding this interventional procedure.  Comments:  No additional relevant information.  Plan of Care (POC)  Orders:  Orders Placed This Encounter  Procedures   DG PAIN CLINIC C-ARM 1-60 MIN NO REPORT    Intraoperative interpretation by procedural physician at [facility].    Standing Status:   Standing    Number of Occurrences:   1    Order Specific Question:   Reason for exam:    Answer:   Assistance in needle guidance and placement for procedures requiring needle placement in or near specific anatomical locations not easily accessible without such assistance.   Medications ordered for procedure: Meds ordered this encounter  Medications   lidocaine (XYLOCAINE) 2 % (with pres) injection 400 mg   diazepam (VALIUM) tablet 5 mg    Make sure Flumazenil is available in the pyxis when using this medication. If oversedation occurs, administer 0.2 mg IV over 15 sec. If after 45 sec no response, administer 0.2 mg again over 1 min; may repeat at 1 min intervals; not to exceed 4 doses (1 mg)   dexamethasone (DECADRON) injection 10 mg   ropivacaine (PF) 2 mg/mL (0.2%) (NAROPIN) injection 9 mL   ropivacaine (PF) 2 mg/mL (0.2%) (NAROPIN) injection 9 mL   Medications administered: We administered lidocaine, diazepam, dexamethasone, ropivacaine (PF) 2 mg/mL (0.2%), and ropivacaine (PF) 2 mg/mL (0.2%).  See the medical record for exact dosing, route, and time of  administration.  Follow-up plan:   Return in about 3 weeks (around 10/27/2022) for Post Procedure Evaluation, in person.       Right TON, C3, C4 MBNB 08/20/22, Right C4,5,6,7, MBNB 10/06/22      Recent  Visits Date Type Provider Dept  09/17/22 Office Visit Edward Jolly, MD [facility]  08/20/22 Procedure visit Edward Jolly, MD [facility]  07/29/22 Office Visit Edward Jolly, MD [facility]  Showing recent visits within past 90 days and meeting all other requirements Today's Visits Date Type Provider Dept  10/06/22 Procedure visit Edward Jolly, MD [facility]  Showing today's visits and meeting all other requirements Future Appointments No visits were found meeting these conditions. Showing future appointments within next 90 days and meeting all other requirements  Disposition: Discharge home  Discharge (Date  Time): 10/06/2022; 0955 hrs.   Primary Care Physician: Sherlene Shams, MD Location: [facility] Note by: Edward Jolly, MD (TTS technology used. I apologize for any typographical errors that were not detected and corrected.) Date: 10/06/2022; Time: 9:51 AM  Disclaimer:  Medicine is not an Visual merchandiser. The only guarantee in medicine is that nothing is guaranteed. It is important to note that the decision to proceed with this intervention was based on the information collected from the patient. The Data and conclusions were drawn from the patient's questionnaire, the interview, and the physical examination. Because the information was provided in large part by the patient, it cannot be guaranteed that it has not been purposely or unconsciously manipulated. Every effort has been made to obtain as much relevant data as possible for this evaluation. It is important to note that the conclusions that lead to this procedure are derived in large part from the available data. Always take into account that the treatment will also be dependent on availability of resources and existing treatment guidelines, considered by other Pain Management Practitioners as being common knowledge and practice, at the time of the intervention.  For Medico-Legal purposes, it is also important to point out that variation in procedural techniques and pharmacological choices are the acceptable norm. The indications, contraindications, technique, and results of the above procedure should only be interpreted and judged by a Board-Certified Interventional Pain Specialist with extensive familiarity and expertise in the same exact procedure and technique.

## 2022-10-06 NOTE — Patient Instructions (Signed)

## 2022-10-07 ENCOUNTER — Telehealth: Payer: Self-pay

## 2022-10-07 NOTE — Telephone Encounter (Signed)
Post procedure follow up.  Patient states she is doing ok.  

## 2022-10-09 ENCOUNTER — Encounter (INDEPENDENT_AMBULATORY_CARE_PROVIDER_SITE_OTHER): Payer: Self-pay

## 2022-10-20 ENCOUNTER — Telehealth: Payer: Self-pay

## 2022-10-20 ENCOUNTER — Ambulatory Visit: Payer: No Typology Code available for payment source | Attending: Internal Medicine

## 2022-10-20 DIAGNOSIS — Z95810 Presence of automatic (implantable) cardiac defibrillator: Secondary | ICD-10-CM | POA: Diagnosis not present

## 2022-10-20 DIAGNOSIS — I5022 Chronic systolic (congestive) heart failure: Secondary | ICD-10-CM

## 2022-10-20 NOTE — Progress Notes (Signed)
EPIC Encounter for ICM Monitoring  Patient Name: Suzanne Spears is a 62 y.o. female Date: 10/20/2022 Primary Care Physican: Sherlene Shams, MD Primary Cardiologist: Mariah Milling Electrophysiologist: Joycelyn Schmid Pacing:  98.5%    06/04/2022 Weight: 159 lbs 07/25/2022 Office Weight: 165 lbs            Attempted call to patient and unable to reach.  Left detailed message per DPR regarding transmission. Transmission reviewed.    Diet: Does not adhere to low salt diet   OptiVol Thoracic impedance suggesting possible fluid accumulation starting 8/21.  Also suggesting fluid from 7/31-8/12.    Prescribed:  Furosemide 20 mg Take1 tablet (20 mg) once daily. May take extra 20 mg as needed for weight gain or swelling.   Spironolactone 25 mg take 1 tablet daily   Labs: 08/14/2022 Creatinine 1.36, BUN 26, Potassium 4.2, Sodium 138, GFR 44  07/25/2022 Creatinine 1.25, BUN 20, Potassium 4.7, Sodium 138, GFR 49  A complete set of results can be found in Results Review.   Recommendations:  Left voice mail with ICM number and encouraged to call if experiencing any fluid symptoms.   Follow-up plan: ICM clinic phone appointment on 10/28/2022 to recheck fluid levels.  91 day device clinic remote transmission 10/23/2022.     EP/Cardiology Office Visits:  01/29/2023 with Dr Graciela Husbands.   Copy of ICM check sent to Dr. Graciela Husbands.    3 month ICM trend: 10/20/2022.    12-14 Month ICM trend:     Karie Soda, RN 10/20/2022 2:09 PM

## 2022-10-20 NOTE — Telephone Encounter (Signed)
 Remote ICM transmission received.  Attempted call to patient regarding ICM remote transmission and left detailed message per DPR.  Left ICM phone number and advised to return call for any fluid symptoms or questions. Next ICM remote transmission scheduled 10/28/2022.

## 2022-10-23 ENCOUNTER — Ambulatory Visit: Payer: BC Managed Care – PPO

## 2022-10-28 ENCOUNTER — Ambulatory Visit (INDEPENDENT_AMBULATORY_CARE_PROVIDER_SITE_OTHER): Payer: No Typology Code available for payment source

## 2022-10-28 ENCOUNTER — Ambulatory Visit: Payer: No Typology Code available for payment source | Attending: Internal Medicine

## 2022-10-28 DIAGNOSIS — I420 Dilated cardiomyopathy: Secondary | ICD-10-CM

## 2022-10-28 DIAGNOSIS — I428 Other cardiomyopathies: Secondary | ICD-10-CM

## 2022-10-28 DIAGNOSIS — Z95810 Presence of automatic (implantable) cardiac defibrillator: Secondary | ICD-10-CM

## 2022-10-28 DIAGNOSIS — I5022 Chronic systolic (congestive) heart failure: Secondary | ICD-10-CM

## 2022-10-28 LAB — CUP PACEART REMOTE DEVICE CHECK
Battery Remaining Longevity: 81 mo
Battery Voltage: 3 V
Brady Statistic AP VP Percent: 0.95 %
Brady Statistic AP VS Percent: 0.03 %
Brady Statistic AS VP Percent: 97.76 %
Brady Statistic AS VS Percent: 1.26 %
Brady Statistic RA Percent Paced: 0.98 %
Brady Statistic RV Percent Paced: 10.3 %
Date Time Interrogation Session: 20240903002203
HighPow Impedance: 78 Ohm
Implantable Lead Connection Status: 753985
Implantable Lead Connection Status: 753985
Implantable Lead Connection Status: 753985
Implantable Lead Connection Status: 753985
Implantable Lead Implant Date: 20160421
Implantable Lead Implant Date: 20160421
Implantable Lead Implant Date: 20161006
Implantable Lead Implant Date: 20161006
Implantable Lead Location: 753858
Implantable Lead Location: 753858
Implantable Lead Location: 753859
Implantable Lead Location: 753860
Implantable Lead Model: 5071
Implantable Lead Model: 5071
Implantable Lead Model: 5076
Implantable Pulse Generator Implant Date: 20230831
Lead Channel Impedance Value: 323 Ohm
Lead Channel Impedance Value: 380 Ohm
Lead Channel Impedance Value: 4047 Ohm
Lead Channel Impedance Value: 4047 Ohm
Lead Channel Impedance Value: 437 Ohm
Lead Channel Impedance Value: 456 Ohm
Lead Channel Pacing Threshold Amplitude: 0.5 V
Lead Channel Pacing Threshold Amplitude: 1.25 V
Lead Channel Pacing Threshold Pulse Width: 0.4 ms
Lead Channel Pacing Threshold Pulse Width: 0.6 ms
Lead Channel Sensing Intrinsic Amplitude: 16.75 mV
Lead Channel Sensing Intrinsic Amplitude: 16.75 mV
Lead Channel Sensing Intrinsic Amplitude: 3.625 mV
Lead Channel Sensing Intrinsic Amplitude: 3.625 mV
Lead Channel Setting Pacing Amplitude: 1 V
Lead Channel Setting Pacing Amplitude: 1.5 V
Lead Channel Setting Pacing Amplitude: 1.75 V
Lead Channel Setting Pacing Pulse Width: 0.4 ms
Lead Channel Setting Pacing Pulse Width: 0.6 ms
Lead Channel Setting Sensing Sensitivity: 0.3 mV
Zone Setting Status: 755011
Zone Setting Status: 755011

## 2022-10-28 NOTE — Progress Notes (Signed)
EPIC Encounter for ICM Monitoring  Patient Name: Suzanne Spears is a 62 y.o. female Date: 10/28/2022 Primary Care Physican: Sherlene Shams, MD Primary Cardiologist: Mariah Milling Electrophysiologist: Joycelyn Schmid Pacing:  98.4%    06/04/2022 Weight: 159 lbs 07/25/2022 Office Weight: 165 lbs 10/29/2022 Weight: 170 lbs            Spoke with patient and heart failure questions reviewed.  Transmission results reviewed.  Pt has gained weight due to fluid and not adhering to low salt diet at this time.     Diet: Does not adhere to low salt diet   OptiVol Thoracic impedance suggesting fluid levels returned to normal 8/25 and then possible fluid accumulation returned 8/30.      Prescribed:  Furosemide 20 mg Take1 tablet (20 mg) once daily. May take extra 20 mg as needed for weight gain or swelling.   Spironolactone 25 mg take 1 tablet daily   Labs: 08/14/2022 Creatinine 1.36, BUN 26, Potassium 4.2, Sodium 138, GFR 44  07/25/2022 Creatinine 1.25, BUN 20, Potassium 4.7, Sodium 138, GFR 49  A complete set of results can be found in Results Review.   Recommendations:  She has taken extra Lasix today.  Encouraged to limit salt intake and call if experiencing any persistent fluid symptoms.  She manages fluid symptoms by taking extra lasix when needed.   Follow-up plan: ICM clinic phone appointment on 12/01/2022.  91 day device clinic remote transmission 01/27/2023.     EP/Cardiology Office Visits:  01/29/2023 with Dr Graciela Husbands.   Copy of ICM check sent to Dr. Graciela Husbands.    3 month ICM trend: 10/29/2022.    12-14 Month ICM trend:     Karie Soda, RN 10/28/2022 4:57 PM

## 2022-11-03 ENCOUNTER — Encounter: Payer: Self-pay | Admitting: Student in an Organized Health Care Education/Training Program

## 2022-11-03 ENCOUNTER — Ambulatory Visit
Payer: No Typology Code available for payment source | Attending: Student in an Organized Health Care Education/Training Program | Admitting: Student in an Organized Health Care Education/Training Program

## 2022-11-03 VITALS — BP 113/72 | Temp 97.3°F | Ht 65.5 in | Wt 168.0 lb

## 2022-11-03 DIAGNOSIS — G894 Chronic pain syndrome: Secondary | ICD-10-CM | POA: Insufficient documentation

## 2022-11-03 DIAGNOSIS — M47812 Spondylosis without myelopathy or radiculopathy, cervical region: Secondary | ICD-10-CM | POA: Diagnosis present

## 2022-11-03 NOTE — Patient Instructions (Signed)
 ______________________________________________________________________    Preparing for your procedure  Appointments: If you think you may not be able to keep your appointment, call 24-48 hours in advance to cancel. We need time to make it available to others.  During your procedure appointment there will be: No Prescription Refills. No disability issues to discussed. No medication changes or discussions.  Instructions: Food intake: Avoid eating anything solid for at least 8 hours prior to your procedure. Clear liquid intake: You may take clear liquids such as water up to 2 hours prior to your procedure. (No carbonated drinks. No soda.) Transportation: Unless otherwise stated by your physician, bring a driver. (Driver cannot be a Market researcher, Pharmacist, community, or any other form of public transportation.) Morning Medicines: Except for blood thinners, take all of your other morning medications with a sip of water. Make sure to take your heart and blood pressure medicines. If your blood pressure's lower number is above 100, the case will be rescheduled. Blood thinners: Make sure to stop your blood thinners as instructed.  If you take a blood thinner, but were not instructed to stop it, call our office [phone] and ask to talk to a nurse. Not stopping a blood thinner prior to certain procedures could lead to serious complications. Diabetics on insulin: Notify the staff so that you can be scheduled 1st case in the morning. If your diabetes requires high dose insulin, take only  of your normal insulin dose the morning of the procedure and notify the staff that you have done so. Preventing infections: Shower with an antibacterial soap the morning of your procedure.  Build-up your immune system: Take 1000 mg of Vitamin C with every meal (3 times a day) the day prior to your procedure. Antibiotics: Inform the nursing staff if you are taking any antibiotics or if you have any conditions that may require antibiotics  prior to procedures. (Example: recent joint implants)   Pregnancy: If you are pregnant make sure to notify the nursing staff. Not doing so may result in injury to the fetus, including death.  Sickness: If you have a cold, fever, or any active infections, call and cancel or reschedule your procedure. Receiving steroids while having an infection may result in complications. Arrival: You must be in the facility at least 30 minutes prior to your scheduled procedure. Tardiness: Your scheduled time is also the cutoff time. If you do not arrive at least 15 minutes prior to your procedure, you will be rescheduled.  Children: Do not bring any children with you. Make arrangements to keep them home. Dress appropriately: There is always a possibility that your clothing may get soiled. Avoid long dresses. Valuables: Do not bring any jewelry or valuables.  Reasons to call and reschedule or cancel your procedure: (Following these recommendations will minimize the risk of a serious complication.) Surgeries: Avoid having procedures within 2 weeks of any surgery. (Avoid for 2 weeks before or after any surgery). Flu Shots: Avoid having procedures within 2 weeks of a flu shots or . (Avoid for 2 weeks before or after immunizations). Barium: Avoid having a procedure within 7-10 days after having had a radiological study involving the use of radiological contrast. (Myelograms, Barium swallow or enema study). Heart attacks: Avoid any elective procedures or surgeries for the initial 6 months after a "Myocardial Infarction" (Heart Attack). Blood thinners: It is imperative that you stop these medications before procedures. Let us know if you if you take any blood thinner.  Infection: Avoid procedures during or within  two weeks of an infection (including chest colds or gastrointestinal problems). Symptoms associated with infections include: Localized redness, fever, chills, night sweats or profuse sweating, burning sensation  when voiding, cough, congestion, stuffiness, runny nose, sore throat, diarrhea, nausea, vomiting, cold or Flu symptoms, recent or current infections. It is specially important if the infection is over the area that we intend to treat. Heart and lung problems: Symptoms that may suggest an active cardiopulmonary problem include: cough, chest pain, breathing difficulties or shortness of breath, dizziness, ankle swelling, uncontrolled high or unusually low blood pressure, and/or palpitations. If you are experiencing any of these symptoms, cancel your procedure and contact your primary care physician for an evaluation.  Remember:  Regular Business hours are:  Monday to Thursday 8:00 AM to 4:00 PM  Provider's Schedule: Delano Metz, MD:  Procedure days: Tuesday and Thursday 7:30 AM to 4:00 PM  Edward Jolly, MD:  Procedure days: Monday and Wednesday 7:30 AM to 4:00 PM Last  Updated: 10/14/2022 ______________________________________________________________________

## 2022-11-03 NOTE — Progress Notes (Signed)
Safety precautions to be maintained throughout the outpatient stay will include: orient to surroundings, keep bed in low position, maintain call bell within reach at all times, provide assistance with transfer out of bed and ambulation.  

## 2022-11-03 NOTE — Progress Notes (Signed)
PROVIDER NOTE: Information contained herein reflects review and annotations entered in association with encounter. Interpretation of such information and data should be left to medically-trained personnel. Information provided to patient can be located elsewhere in the medical record under "Patient Instructions". Document created using STT-dictation technology, any transcriptional errors that may result from process are unintentional.    Patient: Suzanne Spears  Service Category: E/M  Provider: Edward Jolly, MD  DOB: 03/03/1960  DOS: 11/03/2022  Referring Provider: Sherlene Shams, MD  MRN: [id number]  Specialty: Interventional Pain Management  PCP: Sherlene Shams, MD  Type: Established Patient  Setting: Ambulatory outpatient    Location: Office  Delivery: Face-to-face     HPI  Suzanne Spears, a 62 y.o. year old female, is here today because of her Cervical facet joint syndrome [M47.812]. Ms. Slight primary complain today is Neck Pain (right)  Pain Assessment: Severity of Chronic pain is reported as a 6 /10. Location:   Right/ (radiate up to back of head causing headaches, hx of migraines). Onset: More than a month ago. Quality: Aching, Constant, Burning, Radiating, Stabbing, Discomfort. Timing: Constant. Modifying factor(s): procedure, heat, voltaren drug. Vitals:  height is 5' 5.5" (1.664 m) and weight is 168 lb (76.2 kg). Her temperature is 97.3 F (36.3 C) (abnormal). Her blood pressure is 113/72.  BMI: Estimated body mass index is 27.53 kg/m as calculated from the following:   Height as of this encounter: 5' 5.5" (1.664 m).   Weight as of this encounter: 168 lb (76.2 kg). Last encounter: 09/17/2022. Last procedure: 10/06/2022.  Reason for encounter: post-procedure evaluation and assessment.    Post-procedure evaluation   Procedure: Cervical Facet Medial Branch Block(s) #1  Laterality: Right  Level: C4, C5, C6, and C7 Medial Branch Level(s). Injecting these levels blocks the C4-5,  C5-6, and C6-7 cervical facet joints.  Imaging: Fluoroscopic guidance Anesthesia: Local anesthesia (1-2% Lidocaine) Anxiolysis: PO Valium DOS: 10/06/2022  Performed by: Edward Jolly, MD  Purpose: Diagnostic/Therapeutic Indications: Cervicalgia (cervical spine axial pain) severe enough to impact quality of life or function. 1. Cervical facet joint syndrome   2. Cervical spondylosis   3. Chronic pain syndrome    NAS-11 Pain score:   Pre-procedure: 4 /10   Post-procedure: 4 /10      Effectiveness:  Initial hour after procedure: 100 %  Subsequent 4-6 hours post-procedure: 100 %  Analgesia past initial 6 hours: 80 % (1.5 weeks)  Ongoing improvement:  Analgesic:  back to baseline Function: Back to baseline ROM: Back to baseline   ROS  Constitutional: Denies any fever or chills Gastrointestinal: No reported hemesis, hematochezia, vomiting, or acute GI distress Musculoskeletal:  right sided neck pain Neurological: No reported episodes of acute onset apraxia, aphasia, dysarthria, agnosia, amnesia, paralysis, loss of coordination, or loss of consciousness  Medication Review  DULoxetine, Elderberry, Fremanezumab-vfrm, Multiple Minerals-Vitamins, SYRINGE 3CC/25GX1", Ubrogepant, baclofen, cetirizine, cyanocobalamin, dapagliflozin propanediol, ezetimibe, fluticasone, furosemide, gabapentin, rosuvastatin, sacubitril-valsartan, spironolactone, traMADol, traZODone, valACYclovir, and venlafaxine XR  History Review  Allergy: Ms. Emde is allergic to adhesive [tape], carvedilol, metoprolol, penicillin g, lisinopril, other, prednisone, latex, and levofloxacin. Drug: Ms. Farver  reports no history of drug use. Alcohol:  reports current alcohol use. Tobacco:  reports that she has never smoked. She has never used smokeless tobacco. Social: Ms. Skalski  reports that she has never smoked. She has never used smokeless tobacco. She reports current alcohol use. She reports that she does not use  drugs. Medical:  has a past medical history  of AICD (automatic cardioverter/defibrillator) present, Allergy, Anemia, Asthma, Breast calcifications on mammogram (06/21/2019), Cardiomyopathy, dilated, nonischemic (HCC), CKD (chronic kidney disease), stage III (HCC), Cough, Depression, Dizziness, GERD (gastroesophageal reflux disease), Heart failure with mid range ejection fraction (HFmrEF) (HCC), HFrEF (heart failure with reduced ejection fraction) (HCC), Hip pain, right (03/1998), History of 2019 novel coronavirus disease (COVID-19) (03/11/2019), History of bronchitis, History of shingles, Hyperlipidemia, Hypertension, Left bundle branch block (2008), Migraine, Mitral valve prolapse syndrome, Pericarditis (2008), Peripheral neuropathy, Pneumonia (2016), Presence of permanent cardiac pacemaker, and Spinal headache. Surgical: Ms. Waldron  has a past surgical history that includes Cesarean section; Appendectomy (2009); turbinectomy (2009); ganglionic cyst (remote); tendon release surgery (Left); Colonoscopy; Tonsillectomy; Anterior cervical decomp/discectomy fusion (N/A, 10/21/2012); bi-ventricular implantable cardioverter defibrillator (N/A, 06/15/2014); blood clot removed from left top hand; Thoracotomy (Left, 11/30/2014); Epicardial pacing lead placement (N/A, 11/30/2014); Cardiac catheterization (01/13/05/2015); Cardiac catheterization; Esophagogastroduodenoscopy (egd) with propofol (N/A, 04/07/2017); Breast biopsy (Left, 12/17/2018); Breast biopsy (Left, 12/17/2018); Colonoscopy with propofol (N/A, 07/12/2021); and ICD GENERATOR CHANGEOUT (N/A, 10/24/2021). Family: family history includes Breast cancer in her maternal aunt; Breast cancer (age of onset: 35) in her maternal grandmother; Cancer in her paternal grandfather; Cancer (age of onset: 77) in her mother; Cancer (age of onset: 85) in her maternal grandmother; Diabetes in her maternal grandmother; Pneumonia in her father; Supraventricular tachycardia in her  daughter.  Laboratory Chemistry Profile   Renal Lab Results  Component Value Date   BUN 26 (H) 08/14/2022   CREATININE 1.36 (H) 08/14/2022   BCR 13 07/17/2020   GFR 44.94 (L) 10/28/2018   GFRAA 54 (L) 11/04/2019   GFRNONAA 44 (L) 08/14/2022    Hepatic Lab Results  Component Value Date   AST 25 01/30/2022   ALT 18 01/30/2022   ALBUMIN 4.2 01/30/2022   ALKPHOS 97 01/30/2022   HCVAB NEGATIVE 08/17/2015    Electrolytes Lab Results  Component Value Date   NA 138 08/14/2022   K 4.2 08/14/2022   CL 101 08/14/2022   CALCIUM 9.1 08/14/2022   MG 1.8 05/02/2020    Bone Lab Results  Component Value Date   VD25OH 43.92 12/08/2013    Inflammation (CRP: Acute Phase) (ESR: Chronic Phase) Lab Results  Component Value Date   CRP <1.0 06/05/2022   ESRSEDRATE 18 06/05/2022         Note: Above Lab results reviewed.  Recent Imaging Review  CUP PACEART REMOTE DEVICE CHECK Scheduled remote reviewed. Normal device function. 17 AHR detections on several dates in the monitoring period, EGMs consistent with lead noise secondary to EMI.  Several ventricular sensing episodes within monitoring period, EGM trends consistent with  ST and PAT of short duration. Within the monitoring period, HF diagnostics have been abnormal, followed by HF clinic.  Next remote 91 days. - CS, CVRS Note: Reviewed        Physical Exam  General appearance: Well nourished, well developed, and well hydrated. In no apparent acute distress Mental status: Alert, oriented x 3 (person, place, & time)       Respiratory: No evidence of acute respiratory distress Eyes: PERLA Vitals: BP 113/72   Temp (!) 97.3 F (36.3 C)   Ht 5' 5.5" (1.664 m)   Wt 168 lb (76.2 kg)   LMP 07/12/2016   BMI 27.53 kg/m  BMI: Estimated body mass index is 27.53 kg/m as calculated from the following:   Height as of this encounter: 5' 5.5" (1.664 m).   Weight as of this encounter: 168 lb (76.2 kg).  Ideal: Ideal body weight: 58.2 kg  (128 lb 3.2 oz) Adjusted ideal body weight: 65.4 kg (144 lb 1.9 oz)  Cervical Spine Area Exam  Skin & Axial Inspection: Well healed scar from previous spine surgery detected Alignment: Symmetrical Functional ROM: Pain restricted ROM, to the right Stability: No instability detected Muscle Tone/Strength: Functionally intact. No obvious neuro-muscular anomalies detected. Sensory (Neurological): Musculoskeletal pain pattern Palpation: No palpable anomalies              Assessment   Diagnosis Status  1. Cervical facet joint syndrome   2. Cervical spondylosis   3. Chronic pain syndrome    Responding Responding Controlled     Plan of Care  Repeat right diagnostic cervical facet medial branch nerve block #2 at C4, C5, C6, C7 and then consider cervical radiofrequency ablation the purpose of obtaining longer-term pain relief. Patient states that her previous set of cervical medial branch nerve blocks done 10/06/2022 are uncomfortable.  We discussed doing her second set with IV sedation.  Orders:  Orders Placed This Encounter  Procedures   CERVICAL FACET (MEDIAL BRANCH NERVE BLOCK)     Standing Status:   Future    Standing Expiration Date:   02/02/2023    Scheduling Instructions:     Side: RIGHT     Level: C4-5, C5-6, and C6-7 Facet joints (C4, C5, C6, and C7 Medial Branch Nerves)     Sedation: IV Versed     Timeframe: As soon as schedule allows    Order Specific Question:   Where will this procedure be performed?    Answer:   [facility]   Follow-up plan:   Return in about 16 days (around 11/19/2022) for Right C4, 5, 6, 7 MBNB #2, in clinic IV Versed.      Right TON, C3, C4 MBNB 08/20/22, Right C4,5,6,7, MBNB 10/06/22       Recent Visits Date Type Provider Dept  10/06/22 Procedure visit Edward Jolly, MD [facility]  09/17/22 Office Visit Edward Jolly, MD [facility]  08/20/22 Procedure visit Edward Jolly, MD [facility]  Showing  recent visits within past 90 days and meeting all other requirements Today's Visits Date Type Provider Dept  11/03/22 Office Visit Edward Jolly, MD [facility]  Showing today's visits and meeting all other requirements Future Appointments No visits were found meeting these conditions. Showing future appointments within next 90 days and meeting all other requirements  I discussed the assessment and treatment plan with the patient. The patient was provided an opportunity to ask questions and all were answered. The patient agreed with the plan and demonstrated an understanding of the instructions.  Patient advised to call back or seek an in-person evaluation if the symptoms or condition worsens.  Duration of encounter: .  Total time on encounter, as per AMA guidelines included both the face-to-face and non-face-to-face time personally spent by the physician and/or other qualified health care professional(s) on the day of the encounter (includes time in activities that require the physician or other qualified health care professional and does not include time in activities normally performed by clinical staff). Physician's time may include the following activities when performed: Preparing to see the patient (e.g., pre-charting review of records, searching for previously ordered imaging, lab work, and nerve conduction tests) Review of prior analgesic pharmacotherapies. Reviewing PMP Interpreting ordered tests (e.g., lab work, imaging, nerve conduction tests) Performing post-procedure evaluations, including interpretation of diagnostic procedures Obtaining and/or reviewing separately obtained history  Performing a medically appropriate examination and/or evaluation Counseling and educating the patient/family/caregiver Ordering medications, tests, or procedures Referring and communicating with other health care professionals (when not separately reported) Documenting clinical  information in the electronic or other health record Independently interpreting results (not separately reported) and communicating results to the patient/ family/caregiver Care coordination (not separately reported)  Note by: Edward Jolly, MD Date: 11/03/2022; Time: 3:05 PM

## 2022-11-04 NOTE — Progress Notes (Signed)
Remote ICD transmission.   

## 2022-11-14 ENCOUNTER — Emergency Department: Payer: No Typology Code available for payment source

## 2022-11-14 ENCOUNTER — Emergency Department
Admission: EM | Admit: 2022-11-14 | Discharge: 2022-11-14 | Disposition: A | Discharge status: Home / Self Care | Payer: No Typology Code available for payment source | Attending: Emergency Medicine | Admitting: Emergency Medicine

## 2022-11-14 ENCOUNTER — Other Ambulatory Visit: Payer: Self-pay

## 2022-11-14 DIAGNOSIS — R079 Chest pain, unspecified: Secondary | ICD-10-CM | POA: Insufficient documentation

## 2022-11-14 DIAGNOSIS — Z1152 Encounter for screening for COVID-19: Secondary | ICD-10-CM | POA: Diagnosis not present

## 2022-11-14 DIAGNOSIS — G43909 Migraine, unspecified, not intractable, without status migrainosus: Secondary | ICD-10-CM | POA: Insufficient documentation

## 2022-11-14 DIAGNOSIS — N182 Chronic kidney disease, stage 2 (mild): Secondary | ICD-10-CM | POA: Insufficient documentation

## 2022-11-14 DIAGNOSIS — I5032 Chronic diastolic (congestive) heart failure: Secondary | ICD-10-CM | POA: Diagnosis not present

## 2022-11-14 DIAGNOSIS — J45909 Unspecified asthma, uncomplicated: Secondary | ICD-10-CM | POA: Insufficient documentation

## 2022-11-14 DIAGNOSIS — R519 Headache, unspecified: Secondary | ICD-10-CM | POA: Diagnosis present

## 2022-11-14 LAB — CBC
HCT: 49.5 % — ABNORMAL HIGH (ref 36.0–46.0)
Hemoglobin: 15.9 g/dL — ABNORMAL HIGH (ref 12.0–15.0)
MCH: 30.4 pg (ref 26.0–34.0)
MCHC: 32.1 g/dL (ref 30.0–36.0)
MCV: 94.6 fL (ref 80.0–100.0)
Platelets: 284 10*3/uL (ref 150–400)
RBC: 5.23 MIL/uL — ABNORMAL HIGH (ref 3.87–5.11)
RDW: 13.2 % (ref 11.5–15.5)
WBC: 7.3 10*3/uL (ref 4.0–10.5)
nRBC: 0 % (ref 0.0–0.2)

## 2022-11-14 LAB — TROPONIN I (HIGH SENSITIVITY)
Troponin I (High Sensitivity): 12 ng/L (ref <18)
Troponin I (High Sensitivity): 16 ng/L (ref <18)

## 2022-11-14 LAB — BASIC METABOLIC PANEL
Anion gap: 13 (ref 5–15)
BUN: 28 mg/dL — ABNORMAL HIGH (ref 8–23)
CO2: 28 mmol/L (ref 22–32)
Calcium: 10.3 mg/dL (ref 8.9–10.3)
Chloride: 98 mmol/L (ref 98–111)
Creatinine, Ser: 1.32 mg/dL — ABNORMAL HIGH (ref 0.44–1.00)
GFR, Estimated: 46 mL/min — ABNORMAL LOW (ref >60)
Glucose, Bld: 96 mg/dL (ref 70–99)
Potassium: 3.7 mmol/L (ref 3.5–5.1)
Sodium: 139 mmol/L (ref 135–145)

## 2022-11-14 LAB — RESP PANEL BY RT-PCR (RSV, FLU A&B, COVID)  RVPGX2
Influenza A by PCR: NEGATIVE
Influenza B by PCR: NEGATIVE
Resp Syncytial Virus by PCR: NEGATIVE
SARS Coronavirus 2 by RT PCR: NEGATIVE

## 2022-11-14 LAB — BRAIN NATRIURETIC PEPTIDE: B Natriuretic Peptide: 68.6 pg/mL (ref 0.0–100.0)

## 2022-11-14 LAB — LIPASE, BLOOD: Lipase: 41 U/L (ref 11–51)

## 2022-11-14 MED ORDER — LACTATED RINGERS IV BOLUS
1000.0000 mL | Freq: Once | INTRAVENOUS | Status: AC
  Administered 2022-11-14: 1000 mL via INTRAVENOUS

## 2022-11-14 MED ORDER — PROCHLORPERAZINE EDISYLATE 10 MG/2ML IJ SOLN
5.0000 mg | Freq: Once | INTRAMUSCULAR | Status: AC
  Administered 2022-11-14: 5 mg via INTRAVENOUS
  Filled 2022-11-14: qty 2

## 2022-11-14 MED ORDER — KETOROLAC TROMETHAMINE 15 MG/ML IJ SOLN
15.0000 mg | Freq: Once | INTRAMUSCULAR | Status: AC
  Administered 2022-11-14: 15 mg via INTRAVENOUS
  Filled 2022-11-14: qty 1

## 2022-11-14 MED ORDER — DIPHENHYDRAMINE HCL 50 MG/ML IJ SOLN
25.0000 mg | Freq: Once | INTRAMUSCULAR | Status: AC
  Administered 2022-11-14: 25 mg via INTRAVENOUS
  Filled 2022-11-14: qty 1

## 2022-11-14 NOTE — ED Notes (Signed)
Pt ambulated to bathroom. Warm blanket given. Pt resting with NAD, call light within reach.

## 2022-11-14 NOTE — Discharge Instructions (Signed)
You were seen in the emergency department today for your headache, chest pain, and fatigue symptoms.  Laboratory workup is reassuring at this time with normal markers for your heart and EKG consistent with your baseline.  I suspect likely viral infection.  Please follow-up with your primary care provider and cardiologist next week for reassessment.  Please return to the emergency department for any worsening symptoms.

## 2022-11-14 NOTE — ED Triage Notes (Signed)
Pt here with cp. Pt states pain is centered and does not radiate. Pt states pain is intermittent. Pt states it may be her asthma but she is unsure. Pt has a hx of chf and LBBB.

## 2022-11-14 NOTE — ED Provider Notes (Signed)
[facility] Provider Note    Event Date/Time   First MD Initiated Contact with Patient 11/14/22 1552     (approximate)   History   Chest Pain   HPI Suzanne Spears is a 62 y.o. female with dilated cardiomyopathy, s/p ICD, chronic diastolic heart failure, left bundle branch block, asthma, CKD stage II presenting today for chest pain.  Patient states for the past 5 days she has had intermittent headaches, chest pain, body aches, and increasing fatigue.  She is also noted some new sinus congestion and runny nose as well.  History of migraines and this overall feels similar.  The chest pain comes and goes and is not constant nor severe.  No radiation.  Denies shortness of breath, nausea, vomiting, abdominal pain, diaphoresis, new leg swelling.     Physical Exam   Triage Vital Signs: ED Triage Vitals  Encounter Vitals Group     BP 11/14/22 1339 136/74     Systolic BP Percentile --      Diastolic BP Percentile --      Pulse Rate 11/14/22 1339 88     Resp 11/14/22 1339 18     Temp 11/14/22 1340 98.3 F (36.8 C)     Temp src --      SpO2 11/14/22 1339 96 %     Weight 11/14/22 1340 167 lb 15.9 oz (76.2 kg)     Height 11/14/22 1340 5' 5.5" (1.664 m)     Head Circumference --      Peak Flow --      Pain Score 11/14/22 1339 5     Pain Loc --      Pain Education --      Exclude from Growth Chart --     Most recent vital signs: Vitals:   11/14/22 1339 11/14/22 1340  BP: 136/74   Pulse: 88   Resp: 18   Temp:  98.3 F (36.8 C)  SpO2: 96%    Physical Exam: I have reviewed the vital signs and nursing notes. General: Awake, alert, no acute distress.  Nontoxic appearing. Head:  Atraumatic, normocephalic.   ENT:  EOM intact, PERRL. Oral mucosa is pink and moist with no lesions. Neck: Neck is supple with full range of motion, No meningeal signs. Cardiovascular:  RRR, No murmurs. Peripheral pulses palpable and equal bilaterally. Respiratory:   Symmetrical chest wall expansion.  No rhonchi, rales, or wheezes.  Good air movement throughout.  No use of accessory muscles.   Musculoskeletal:  No cyanosis or edema. Moving extremities with full ROM Abdomen:  Soft, nontender, nondistended. Neuro:  GCS 15, moving all four extremities, interacting appropriately. Speech clear. Psych:  Calm, appropriate.   Skin:  Warm, dry, no rash.    ED Results / Procedures / Treatments   Labs (all labs ordered are listed, but only abnormal results are displayed) Labs Reviewed  BASIC METABOLIC PANEL - Abnormal; Notable for the following components:      Result Value   BUN 28 (*)    Creatinine, Ser 1.32 (*)    GFR, Estimated 46 (*)    All other components within normal limits  CBC - Abnormal; Notable for the following components:   RBC 5.23 (*)    Hemoglobin 15.9 (*)    HCT 49.5 (*)    All other components within normal limits  RESP PANEL BY RT-PCR (RSV, FLU A&B, COVID)  RVPGX2  BRAIN NATRIURETIC PEPTIDE  LIPASE, BLOOD  TROPONIN I (HIGH SENSITIVITY)  TROPONIN I (HIGH SENSITIVITY)     EKG My EKG interpretation: Rate of 87, AV sensed ventricular paced rhythm.  QTc 478.  No acute ST elevation or depression.   RADIOLOGY Independently interpreted chest x-ray with no acute pathology.   PROCEDURES:  Critical Care performed: No  Procedures   MEDICATIONS ORDERED IN ED: Medications  lactated ringers bolus 1,000 mL (1,000 mLs Intravenous New Bag/Given 11/14/22 1653)  ketorolac (TORADOL) 15 MG/ML injection 15 mg (15 mg Intravenous Given 11/14/22 1648)  prochlorperazine (COMPAZINE) injection 5 mg (5 mg Intravenous Given 11/14/22 1651)  diphenhydrAMINE (BENADRYL) injection 25 mg (25 mg Intravenous Given 11/14/22 1646)     IMPRESSION / MDM / ASSESSMENT AND PLAN / ED COURSE  I reviewed the triage vital signs and the nursing notes.                              Differential diagnosis includes, but is not limited to, viral URI,, migraine,  dehydration, pneumonia, asthma exacerbation, ACS.  Patient's presentation is most consistent with acute illness / injury with system symptoms.  Patient is a 62 year old female presenting today with intermittent migraines, chest pain, body aches, sinus congestion, and fatigue over the past 5 days.  Currently at this time mostly noting migraine symptoms.  EKG consistent with her baseline.  Troponins negative x 2 with no active chest pain.  Low suspicion for ACS at this time with a heart score of 3.  Other laboratory workup largely consistent with baseline.  Negative viral swab.  Chest x-ray unremarkable.  Patient was given 1 L fluids, Toradol, Compazine, and Benadryl with resolution of migraine symptoms.  Safe for discharge with likely suspicion of viral infection.  Given strict return precautions and told to follow-up with PCP and cardiologist next week.  The patient is on the cardiac monitor to evaluate for evidence of arrhythmia and/or significant heart rate changes. Clinical Course as of 11/14/22 1735  Fri Nov 14, 2022  1609 Basic metabolic panel(!) Creatinine consistent with baseline [DW]  1609 B Natriuretic Peptide: 68.6 [DW]  1609 Troponin I (High Sensitivity): 12 [DW]  1609 CBC(!) Unremarkable [DW]  1610 DG Chest 2 View Independently interpreted chest x-ray with no acute pathology [DW]  1711 Troponin I (High Sensitivity): 16 Delta of 4.  Low suspicion for ACS at this time with no chest pain and symptoms being intermittent over the past 5 days. [DW]  1732 Reassessed patient.  All symptoms have resolved at this time.  Suspect likely viral URI [DW]    Clinical Course User Index [DW] Janith Lima, MD     FINAL CLINICAL IMPRESSION(S) / ED DIAGNOSES   Final diagnoses:  Migraine without status migrainosus, not intractable, unspecified migraine type  Chest pain, unspecified type     Rx / DC Orders   ED Discharge Orders          Ordered    Ambulatory referral to Cardiology        Comments: If you have not heard from the Cardiology office within the next 72 hours please call [phone].   11/14/22 1734             Note:  This document was prepared using Dragon voice recognition software and may include unintentional dictation errors.   Janith Lima, MD 11/14/22 [phone]

## 2022-11-18 ENCOUNTER — Encounter: Payer: Self-pay | Admitting: Internal Medicine

## 2022-11-18 ENCOUNTER — Ambulatory Visit: Payer: No Typology Code available for payment source | Admitting: Internal Medicine

## 2022-11-18 VITALS — BP 108/64 | HR 86 | Ht 65.5 in | Wt 174.2 lb

## 2022-11-18 DIAGNOSIS — R635 Abnormal weight gain: Secondary | ICD-10-CM

## 2022-11-18 DIAGNOSIS — E663 Overweight: Secondary | ICD-10-CM | POA: Insufficient documentation

## 2022-11-18 DIAGNOSIS — Z23 Encounter for immunization: Secondary | ICD-10-CM

## 2022-11-18 DIAGNOSIS — J4520 Mild intermittent asthma, uncomplicated: Secondary | ICD-10-CM | POA: Diagnosis not present

## 2022-11-18 MED ORDER — CYANOCOBALAMIN 1000 MCG/ML IJ SOLN: 1000.0000 ug | INTRAMUSCULAR | 1 refills | Status: DC

## 2022-11-18 MED ORDER — DOXYCYCLINE HYCLATE 100 MG PO TABS: 100.0000 mg | ORAL_TABLET | Freq: Two times a day (BID) | ORAL | 0 refills | Status: DC

## 2022-11-18 MED ORDER — TRIAMCINOLONE ACETONIDE 0.5 % EX OINT: 1.0000 | TOPICAL_OINTMENT | Freq: Two times a day (BID) | CUTANEOUS | 0 refills | Status: DC

## 2022-11-18 MED ORDER — GABAPENTIN 100 MG PO CAPS: 100.0000 mg | ORAL_CAPSULE | Freq: Three times a day (TID) | ORAL | 1 refills | Status: DC

## 2022-11-18 MED ORDER — ALBUTEROL SULFATE (2.5 MG/3ML) 0.083% IN NEBU: 2.5000 mg | INHALATION_SOLUTION | Freq: Four times a day (QID) | RESPIRATORY_TRACT | 1 refills | Status: AC | PRN

## 2022-11-18 MED ORDER — ALBUTEROL SULFATE HFA 108 (90 BASE) MCG/ACT IN AERS: 1.0000 | INHALATION_SPRAY | Freq: Four times a day (QID) | RESPIRATORY_TRACT | 2 refills | Status: AC | PRN

## 2022-11-18 NOTE — Assessment & Plan Note (Signed)
I have addressed  BMI and recommended wt loss of 10% of body weigh over the next 6 months using a low glycemic index diet and regular exercise a minimum of 5 days per week.

## 2022-11-18 NOTE — Progress Notes (Signed)
Subjective:  Patient ID: Suzanne Spears, female    DOB: 01-22-1961  Age: 62 y.o. MRN: [id number]  CC: The primary encounter diagnosis was Weight gain. Diagnoses of Mild intermittent asthma without complication, Need for influenza vaccination, Mild intermittent chronic asthma without complication, and Overweight (BMI 25.0-29.9) were also pertinent to this visit.   HPI Suzanne Spears presents for  Chief Complaint  Patient presents with   Medical Management of Chronic Issues    Weight   62 yr old female with nonischemic cardiomyaopthy s/p AICD      1) frustrated at overweight and continued weight gain  2) reviewed ER visit from Friday Sept 20 for chest pain , headache and viral symptoms.  Ruled out for AMI.  Diagnosed with viral syndrome and migraine  . Reviewed June neurology eval by Sherryll Burger  her headaches iare multifactorial  and include migraine and occipital neuralgia . Waiting for follow up visit with Pain clinic for an therapeutic/diagnostic injection  followed by  an ablatio if indicate  by results of injcetion .     Needs albuterol refill for nebs and MDI    History of insect biteson the weekend on the scalp and mid back   3) right knee meniscal tear diagnosed this summer by orthopedics by CT   4) right thumb pain at the base,  seeing [facility] all day,    Does not qualify for disability b/c her income is > 1400/month       Outpatient Medications Prior to Visit  Medication Sig Dispense Refill   baclofen (LIORESAL) 10 MG tablet Take 1 tablet (10 mg total) by mouth 3 (three) times daily. As needed for muscle spasm 90 tablet 5   cetirizine (ZYRTEC) 10 MG chewable tablet Chew 10 mg by mouth daily.     dapagliflozin propanediol (FARXIGA) 10 MG TABS tablet TAKE 1 TABLET(10 MG) BY MOUTH DAILY 90 tablet 3   DULoxetine (CYMBALTA) 30 MG capsule Take 1 capsule (30 mg total) by mouth daily. 90 capsule 1   Elderberry 500 MG CAPS Take 500 mg by mouth daily as needed.      ENTRESTO 24-26 MG Take 0.5 tablets by mouth 2 (two) times daily. 90 tablet 3   ezetimibe (ZETIA) 10 MG tablet Take 1 tablet (10 mg total) by mouth daily. 90 tablet 3   fluticasone (FLONASE) 50 MCG/ACT nasal spray USE 2 SPRAYS INTO BOTH NOSTRILS ONCE DAILY AS DIRECTED BY PHYSICIAN. 48 g 2   furosemide (LASIX) 20 MG tablet Take 1 tablet (20 mg total) by mouth as directed. Take 1 tablet daily. Take extra 20 mg as needed. 180 tablet 3   Multiple Minerals-Vitamins (YUMVS CALC-MAG-ZINC-VIT D PO) Take by mouth.     rosuvastatin (CRESTOR) 5 MG tablet Take 1 tablet (5 mg total) by mouth daily. 90 tablet 3   spironolactone (ALDACTONE) 25 MG tablet TAKE 1 TABLET(25 MG) BY MOUTH DAILY 90 tablet 1   Syringe/Needle, Disp, (SYRINGE 3CC/25GX1") 25G X 1" 3 ML MISC Use to inject 1 mL of vitamin B12 intramuscular every 30 days. 50 each 0   traMADol (ULTRAM) 50 MG tablet Take 50 mg by mouth every 6 (six) hours as needed.     traZODone (DESYREL) 50 MG tablet TAKE 1/2 TO 1 TABLET BY MOUTH AT BEDTIME AS NEEDED FOR SLEEP 90 tablet 1   UBRELVY 100 MG TABS Take 1 tablet by mouth daily.     valACYclovir (VALTREX) 1000 MG tablet Take 1 tablet (1,000 mg total)  by mouth 2 (two) times daily as needed (fever blisters). 20 tablet 3   venlafaxine XR (EFFEXOR-XR) 150 MG 24 hr capsule TAKE 1 CAPSULE BY MOUTH EVERY DAY WITH BREAKFAST 90 capsule 1   cyanocobalamin (VITAMIN B12) 1000 MCG/ML injection Inject 1 mL (1,000 mcg total) into the muscle every 30 (thirty) days. 10 mL 1   Fremanezumab-vfrm 225 MG/1.5ML SOAJ Inject into the skin.     gabapentin (NEURONTIN) 100 MG capsule Take 1 capsule (100 mg total) by mouth 3 (three) times daily. 180 capsule 1   No facility-administered medications prior to visit.    Review of Systems;  Patient denies headache, fevers, malaise, unintentional weight loss, skin rash, eye pain, sinus congestion and sinus pain, sore throat, dysphagia,  hemoptysis , cough, dyspnea, wheezing, chest pain,  palpitations, orthopnea, edema, abdominal pain, nausea, melena, diarrhea, constipation, flank pain, dysuria, hematuria, urinary  Frequency, nocturia, numbness, tingling, seizures,  Focal weakness, Loss of consciousness,  Tremor, insomnia, depression, anxiety, and suicidal ideation.      Objective:  BP 108/64   Pulse 86   Ht 5' 5.5" (1.664 m)   Wt 174 lb 3.2 oz (79 kg)   LMP 07/12/2016   SpO2 95%   BMI 28.55 kg/m   BP Readings from Last 3 Encounters:  11/18/22 108/64  11/14/22 112/68  11/03/22 113/72    Wt Readings from Last 3 Encounters:  11/18/22 174 lb 3.2 oz (79 kg)  11/14/22 167 lb 15.9 oz (76.2 kg)  11/03/22 168 lb (76.2 kg)    Physical Exam Vitals reviewed.  Constitutional:      General: She is not in acute distress.    Appearance: Normal appearance. She is normal weight. She is not ill-appearing, toxic-appearing or diaphoretic.  HENT:     Head: Normocephalic.  Eyes:     General: No scleral icterus.       Right eye: No discharge.        Left eye: No discharge.     Conjunctiva/sclera: Conjunctivae normal.  Cardiovascular:     Rate and Rhythm: Normal rate and regular rhythm.     Heart sounds: Normal heart sounds.  Pulmonary:     Effort: Pulmonary effort is normal. No respiratory distress.     Breath sounds: Normal breath sounds.  Musculoskeletal:        General: Normal range of motion.  Skin:    General: Skin is warm and dry.  Neurological:     General: No focal deficit present.     Mental Status: She is alert and oriented to person, place, and time. Mental status is at baseline.  Psychiatric:        Mood and Affect: Mood normal.        Behavior: Behavior normal.        Thought Content: Thought content normal.        Judgment: Judgment normal.    Lab Results  Component Value Date   HGBA1C 6.0 03/10/2022   HGBA1C 5.6 03/08/2021    Lab Results  Component Value Date   CREATININE 1.32 (H) 11/14/2022   CREATININE 1.36 (H) 08/14/2022   CREATININE 1.25  (H) 07/25/2022    Lab Results  Component Value Date   WBC 7.3 11/14/2022   HGB 15.9 (H) 11/14/2022   HCT 49.5 (H) 11/14/2022   PLT 284 11/14/2022   GLUCOSE 96 11/14/2022   CHOL 138 01/30/2022   TRIG 35 01/30/2022   HDL 67 01/30/2022   LDLDIRECT 62 01/30/2022   LDLCALC  64 01/30/2022   ALT 18 01/30/2022   AST 25 01/30/2022   NA 139 11/14/2022   K 3.7 11/14/2022   CL 98 11/14/2022   CREATININE 1.32 (H) 11/14/2022   BUN 28 (H) 11/14/2022   CO2 28 11/14/2022   TSH 1.00 03/10/2022   INR 1.0 03/02/2020   HGBA1C 6.0 03/10/2022    DG Chest 2 View  Result Date: 11/14/2022 CLINICAL DATA:  Chest pain EXAM: CHEST - 2 VIEW COMPARISON:  09/30/2021 FINDINGS: No consolidation, pneumothorax or effusion. No edema. Normal cardiopericardial silhouette. Left upper chest defibrillator with leads along the right side of the heart. Stable configuration. Fixation hardware along the cervical spine at the edge of the imaging field. IMPRESSION: Hyperinflation.  Defibrillator.  No acute cardiopulmonary disease. Electronically Signed   By: Karen Kays M.D.   On: 11/14/2022 15:15    Assessment & Plan:  .Weight gain -     TSH -     Hemoglobin A1c -     Hepatic function panel -     Lipid Panel w/reflex Direct LDL  Mild intermittent asthma without complication -     Albuterol Sulfate HFA; Inhale 1-2 puffs into the lungs every 6 (six) hours as needed for wheezing or shortness of breath.  Dispense: 8.5 g; Refill: 2  Need for influenza vaccination -     Flu vaccine trivalent PF, 6mos and older(Flulaval,Afluria,Fluarix,Fluzone)  Mild intermittent chronic asthma without complication Assessment & Plan: She has had no exacerbations since working from home. Refilling albuterol nebs and MDI    Overweight (BMI 25.0-29.9) Assessment & Plan: I have addressed  BMI and recommended wt loss of 10% of body weigh over the next 6 months using a low glycemic index diet and regular exercise a minimum of 5 days per  week.     Other orders -     Cyanocobalamin; Inject 1 mL (1,000 mcg total) into the muscle every 30 (thirty) days.  Dispense: 10 mL; Refill: 1 -     Gabapentin; Take 1 capsule (100 mg total) by mouth 3 (three) times daily.  Dispense: 180 capsule; Refill: 1 -     Doxycycline Hyclate; Take 1 tablet (100 mg total) by mouth 2 (two) times daily.  Dispense: 14 tablet; Refill: 0 -     Triamcinolone Acetonide; Apply 1 Application topically 2 (two) times daily. Until resolved  Dispense: 80 g; Refill: 0 -     Albuterol Sulfate; Take 3 mLs (2.5 mg total) by nebulization every 6 (six) hours as needed for wheezing or shortness of breath.  Dispense: 150 mL; Refill: 1     I provided 30 minutes of face-to-face time during this encounter reviewing patient's last visit with me, patient's  most recent visit with cardiology,  and neurology,  recent surgical and non surgical procedures, previous  labs and imaging studies, counseling on currently addressed issues,  and post visit ordering to diagnostics and therapeutics .   Follow-up: Return in about 6 months (around 05/18/2023).   Sherlene Shams, MD

## 2022-11-18 NOTE — Patient Instructions (Addendum)
I have prescribed Doxycycline and steroid ointment  use both  twice daily for the bug bites .  Take the doxy  With food to avoid nausea  Your bra is irritating the bug bite.  Keep it covered for the next 4-5 days

## 2022-11-18 NOTE — Assessment & Plan Note (Signed)
She has had no exacerbations since working from home. Refilling albuterol nebs and MDI

## 2022-11-19 LAB — HEMOGLOBIN A1C: Hgb A1c MFr Bld: 6 % (ref 4.6–6.5)

## 2022-11-19 LAB — HEPATIC FUNCTION PANEL
ALT: 19 U/L (ref 0–35)
AST: 24 U/L (ref 0–37)
Albumin: 4.5 g/dL (ref 3.5–5.2)
Alkaline Phosphatase: 115 U/L (ref 39–117)
Bilirubin, Direct: 0.1 mg/dL (ref 0.0–0.3)
Total Bilirubin: 0.7 mg/dL (ref 0.2–1.2)
Total Protein: 6.8 g/dL (ref 6.0–8.3)

## 2022-11-19 LAB — LIPID PANEL W/REFLEX DIRECT LDL
Cholesterol: 164 mg/dL (ref <200)
HDL: 73 mg/dL (ref >=50)
LDL Cholesterol (Calc): 69 mg/dL (calc)
Non-HDL Cholesterol (Calc): 91 mg/dL (calc) (ref <130)
Total CHOL/HDL Ratio: 2.2 (calc) (ref <5.0)
Triglycerides: 138 mg/dL (ref <150)

## 2022-11-19 LAB — TSH: TSH: 0.97 u[IU]/mL (ref 0.35–5.50)

## 2022-11-26 ENCOUNTER — Telehealth: Payer: Self-pay | Admitting: Student in an Organized Health Care Education/Training Program

## 2022-11-26 NOTE — Telephone Encounter (Signed)
FYI.PT called stated that her insurance has denied the procedure that Lateef placed. PT will like to know what will be Lateef next step.

## 2022-11-26 NOTE — Telephone Encounter (Signed)
I have resubmitted the request. They denied it mostly because it looked like we were asking for 3 levels. When I resubmitted I specified that it was just two levels. They automatically kick it out when three levels are presented.

## 2022-12-01 ENCOUNTER — Telehealth: Payer: Self-pay | Admitting: Student in an Organized Health Care Education/Training Program

## 2022-12-01 ENCOUNTER — Ambulatory Visit: Payer: No Typology Code available for payment source | Attending: Internal Medicine

## 2022-12-01 DIAGNOSIS — Z95810 Presence of automatic (implantable) cardiac defibrillator: Secondary | ICD-10-CM | POA: Diagnosis not present

## 2022-12-01 DIAGNOSIS — I5022 Chronic systolic (congestive) heart failure: Secondary | ICD-10-CM

## 2022-12-01 NOTE — Telephone Encounter (Signed)
PT called states that her insurance reach out to her and told her that the procedure that Lateef place has been denial due to the fact that the patient has already had two of those procedure done already. PT wants to know what is Lateef next step. TY

## 2022-12-08 ENCOUNTER — Emergency Department: Payer: No Typology Code available for payment source

## 2022-12-08 ENCOUNTER — Emergency Department
Admission: EM | Admit: 2022-12-08 | Discharge: 2022-12-08 | Disposition: A | Discharge status: Home / Self Care | Payer: No Typology Code available for payment source | Attending: Emergency Medicine | Admitting: Emergency Medicine

## 2022-12-08 ENCOUNTER — Encounter: Payer: Self-pay | Admitting: *Deleted

## 2022-12-08 ENCOUNTER — Other Ambulatory Visit: Payer: Self-pay

## 2022-12-08 DIAGNOSIS — M940 Chondrocostal junction syndrome [Tietze]: Secondary | ICD-10-CM | POA: Diagnosis not present

## 2022-12-08 DIAGNOSIS — L03116 Cellulitis of left lower limb: Secondary | ICD-10-CM | POA: Diagnosis not present

## 2022-12-08 DIAGNOSIS — R079 Chest pain, unspecified: Secondary | ICD-10-CM | POA: Diagnosis present

## 2022-12-08 LAB — BASIC METABOLIC PANEL
Anion gap: 11 (ref 5–15)
BUN: 22 mg/dL (ref 8–23)
CO2: 25 mmol/L (ref 22–32)
Calcium: 8.9 mg/dL (ref 8.9–10.3)
Chloride: 102 mmol/L (ref 98–111)
Creatinine, Ser: 1.2 mg/dL — ABNORMAL HIGH (ref 0.44–1.00)
GFR, Estimated: 52 mL/min — ABNORMAL LOW (ref >60)
Glucose, Bld: 81 mg/dL (ref 70–99)
Potassium: 3.6 mmol/L (ref 3.5–5.1)
Sodium: 138 mmol/L (ref 135–145)

## 2022-12-08 LAB — CBC
HCT: 46.9 % — ABNORMAL HIGH (ref 36.0–46.0)
Hemoglobin: 14.9 g/dL (ref 12.0–15.0)
MCH: 30.5 pg (ref 26.0–34.0)
MCHC: 31.8 g/dL (ref 30.0–36.0)
MCV: 95.9 fL (ref 80.0–100.0)
Platelets: 208 10*3/uL (ref 150–400)
RBC: 4.89 MIL/uL (ref 3.87–5.11)
RDW: 13.1 % (ref 11.5–15.5)
WBC: 6.5 10*3/uL (ref 4.0–10.5)
nRBC: 0 % (ref 0.0–0.2)

## 2022-12-08 LAB — D-DIMER, QUANTITATIVE: D-Dimer, Quant: 0.39 ug{FEU}/mL (ref 0.00–0.50)

## 2022-12-08 LAB — BRAIN NATRIURETIC PEPTIDE: B Natriuretic Peptide: 154.4 pg/mL — ABNORMAL HIGH (ref 0.0–100.0)

## 2022-12-08 LAB — TROPONIN I (HIGH SENSITIVITY)
Troponin I (High Sensitivity): 10 ng/L (ref <18)
Troponin I (High Sensitivity): 12 ng/L (ref <18)

## 2022-12-08 MED ORDER — KETOROLAC TROMETHAMINE 30 MG/ML IJ SOLN
15.0000 mg | Freq: Once | INTRAMUSCULAR | Status: AC
  Administered 2022-12-08: 15 mg via INTRAVENOUS
  Filled 2022-12-08: qty 1

## 2022-12-08 MED ORDER — DEXAMETHASONE SODIUM PHOSPHATE 10 MG/ML IJ SOLN
10.0000 mg | Freq: Once | INTRAMUSCULAR | Status: AC
  Administered 2022-12-08: 10 mg via INTRAVENOUS
  Filled 2022-12-08: qty 1

## 2022-12-08 MED ORDER — ACETAMINOPHEN 500 MG PO TABS
1000.0000 mg | ORAL_TABLET | Freq: Once | ORAL | Status: AC
  Administered 2022-12-08: 1000 mg via ORAL
  Filled 2022-12-08: qty 2

## 2022-12-08 MED ORDER — CEPHALEXIN 500 MG PO CAPS: 500.0000 mg | ORAL_CAPSULE | Freq: Three times a day (TID) | ORAL | 0 refills | Status: AC

## 2022-12-08 MED ORDER — NAPROXEN 375 MG PO TABS: 375.0000 mg | ORAL_TABLET | Freq: Two times a day (BID) | ORAL | 0 refills | Status: AC

## 2022-12-08 NOTE — ED Provider Notes (Signed)
[facility] Provider Note    Event Date/Time   First MD Initiated Contact with Patient 12/08/22 [phone]     (approximate)   History   Chest Pain   HPI  Suzanne Spears is a 62 y.o. female  here with chest pain. Pt reports that over the last 2 weeks she has had constant, worsening, pleuritic left sided chest pain. Pain is worse w deep inspiration, moving. It is localized mostly over the anterior chest. She has also noticed a red rash on her left lower leg. She is currently wearing a CAM walker boot for plantar fasciitis and had been kicked there accidentally a week ago. She noticed a red rash on her left anterior chest as well, which is improving. No fevers. No chills. No h/o DVT. No numbness or weakness.       Physical Exam   Triage Vital Signs: ED Triage Vitals  Encounter Vitals Group     BP 12/08/22 1415 112/67     Systolic BP Percentile --      Diastolic BP Percentile --      Pulse Rate 12/08/22 1415 92     Resp 12/08/22 1415 16     Temp 12/08/22 1415 98.8 F (37.1 C)     Temp src --      SpO2 12/08/22 1415 100 %     Weight --      Height --      Head Circumference --      Peak Flow --      Pain Score 12/08/22 1419 6     Pain Loc --      Pain Education --      Exclude from Growth Chart --     Most recent vital signs: Vitals:   12/08/22 1838 12/08/22 1936  BP: (!) 131/98 114/68  Pulse: 97 86  Resp: 18 16  Temp: 98.8 F (37.1 C)   SpO2: 100%      General: Awake, no distress.  CV:  Good peripheral perfusion. RRR. Resp:  Normal work of breathing. Lungs clear to auscultation bilaterally. TTP over anterior chest wall, particularly along mid parasternal intercostal spaces.  Abd:  No distention. No tenderness. Other:  Mild area of erythema and slight swelling over left anterior lower leg. No open wounds. No drainage. No popliteal tenderness.   ED Results / Procedures / Treatments   Labs (all labs ordered are listed, but only  abnormal results are displayed) Labs Reviewed  BASIC METABOLIC PANEL - Abnormal; Notable for the following components:      Result Value   Creatinine, Ser 1.20 (*)    GFR, Estimated 52 (*)    All other components within normal limits  CBC - Abnormal; Notable for the following components:   HCT 46.9 (*)    All other components within normal limits  D-DIMER, QUANTITATIVE  BRAIN NATRIURETIC PEPTIDE  TROPONIN I (HIGH SENSITIVITY)  TROPONIN I (HIGH SENSITIVITY)     EKG Atrial sensed V paced rhythm, VR 86. PR 100, QRS 108, QTc 514. No acute ST elevations or depressions.   RADIOLOGY CXR: Clear   I also independently reviewed and agree with radiologist interpretations.   PROCEDURES:  Critical Care performed: No   MEDICATIONS ORDERED IN ED: Medications  ketorolac (TORADOL) 30 MG/ML injection 15 mg (15 mg Intravenous Given 12/08/22 1727)  dexamethasone (DECADRON) injection 10 mg (10 mg Intravenous Given 12/08/22 1728)  acetaminophen (TYLENOL) tablet 1,000 mg (1,000 mg Oral Given 12/08/22 2020)  IMPRESSION / MDM / ASSESSMENT AND PLAN / ED COURSE  I reviewed the triage vital signs and the nursing notes.                              Differential diagnosis includes, but is not limited to, MSK chest wall pain, costochondritis, pleurisy, PE, PNA, ACS, PTX. Left LE cellulitis, DVT, venous stasis.  Patient's presentation is most consistent with acute presentation with potential threat to life or bodily function.  The patient is on the cardiac monitor to evaluate for evidence of arrhythmia and/or significant heart rate changes  62 yo F with PMHx  HTN, HFrEF, HLD, MVP, here with left leg pain, chest pain. Re: leg pain -  Suspect mild cellulitis at site of recent trauma.  She has no evidence of abscess.  She does not appear septic.  DVT study obtained, reviewed, and is negative.  The swelling localizes primarily to the area of redness of her anterior leg.  Will treat with  antibiotics.  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 Reporoducible, sharp on exam. EKG is non ischemic and trop negative, despite sx >12 hr, do not suspect ACS. D-Dimer is neg. CXR is clear with no signs of CHF, PNA, PTX. Will tx for MSK pain, d/c home.   FINAL CLINICAL IMPRESSION(S) / ED DIAGNOSES   Final diagnoses:  Costochondritis  Left leg cellulitis     Rx / DC Orders   ED Discharge Orders          Ordered    naproxen (NAPROSYN) 375 MG tablet  2 times daily with meals        12/08/22 2029    cephALEXin (KEFLEX) 500 MG capsule  3 times daily        12/08/22 2029             Note:  This document was prepared using Dragon voice recognition software and may include unintentional dictation errors.   Shaune Pollack, MD 12/08/22 2030

## 2022-12-08 NOTE — ED Triage Notes (Signed)
Pt was sent here by her Dr. Isidore Moos after she called to see if she could be evaluated there.  Pt reports central CP which increases when she moves, takes a deep breath or coughs, she states that she feels like she pulled something.  Pt also reports itching and burning on her left chest, she is calling it a rash.  Pt is in a cam walker for plantar fascitis and heel spur and would like soreness under boot evaluated (states that she has a bruised area that it red and has been intermittently warm)

## 2022-12-09 ENCOUNTER — Other Ambulatory Visit: Payer: Self-pay

## 2022-12-09 MED ORDER — "SYRINGE 25G X 1"" 3 ML MISC": 0 refills | Status: AC

## 2022-12-09 NOTE — Progress Notes (Signed)
EPIC Encounter for ICM Monitoring  Patient Name: Suzanne Spears is a 62 y.o. female Date: 12/09/2022 Primary Care Physican: Sherlene Shams, MD Primary Cardiologist: Mariah Milling Electrophysiologist: Joycelyn Schmid Pacing:  98.3%    06/04/2022 Weight: 159 lbs 07/25/2022 Office Weight: 165 lbs 10/29/2022 Weight: 170 lbs            Transmission reviewed.   Diet: Does not adhere to low salt diet   OptiVol Thoracic impedance suggesting  intermittent days with possible fluid accumulation within the last month.      Prescribed:  Furosemide 20 mg Take1 tablet (20 mg) once daily. May take extra 20 mg as needed for weight gain or swelling.   Spironolactone 25 mg take 1 tablet daily   Labs: 08/14/2022 Creatinine 1.36, BUN 26, Potassium 4.2, Sodium 138, GFR 44  07/25/2022 Creatinine 1.25, BUN 20, Potassium 4.7, Sodium 138, GFR 49  A complete set of results can be found in Results Review.   Recommendations:  No changes.    Follow-up plan: ICM clinic phone appointment on 01/05/2023.  91 day device clinic remote transmission 01/27/2023.     EP/Cardiology Office Visits:  01/29/2023 with Dr Graciela Husbands.   Copy of ICM check sent to Dr. Graciela Husbands.    3 month ICM trend: 12/01/2022.    12-14 Month ICM trend:     Karie Soda, RN 12/09/2022 1:06 PM

## 2022-12-10 ENCOUNTER — Encounter: Payer: Self-pay | Admitting: Student in an Organized Health Care Education/Training Program

## 2022-12-10 ENCOUNTER — Ambulatory Visit
Payer: No Typology Code available for payment source | Attending: Student in an Organized Health Care Education/Training Program | Admitting: Student in an Organized Health Care Education/Training Program

## 2022-12-10 ENCOUNTER — Ambulatory Visit
Admission: RE | Admit: 2022-12-10 | Discharge: 2022-12-10 | Disposition: A | Discharge status: Home / Self Care | Payer: No Typology Code available for payment source | Source: Ambulatory Visit | Attending: Student in an Organized Health Care Education/Training Program | Admitting: Student in an Organized Health Care Education/Training Program

## 2022-12-10 DIAGNOSIS — M47812 Spondylosis without myelopathy or radiculopathy, cervical region: Secondary | ICD-10-CM | POA: Insufficient documentation

## 2022-12-10 DIAGNOSIS — G894 Chronic pain syndrome: Secondary | ICD-10-CM | POA: Insufficient documentation

## 2022-12-10 MED ORDER — MIDAZOLAM HCL 2 MG/2ML IJ SOLN
INTRAMUSCULAR | Status: AC
  Filled 2022-12-10: qty 2

## 2022-12-10 MED ORDER — LIDOCAINE HCL 2 % IJ SOLN
20.0000 mL | Freq: Once | INTRAMUSCULAR | Status: AC
  Administered 2022-12-10: 400 mg

## 2022-12-10 MED ORDER — LIDOCAINE HCL 2 % IJ SOLN
INTRAMUSCULAR | Status: AC
  Filled 2022-12-10: qty 20

## 2022-12-10 MED ORDER — ROPIVACAINE HCL 2 MG/ML IJ SOLN
9.0000 mL | Freq: Once | INTRAMUSCULAR | Status: AC
  Administered 2022-12-10: 9 mL via PERINEURAL

## 2022-12-10 MED ORDER — MIDAZOLAM HCL 2 MG/2ML IJ SOLN
0.5000 mg | Freq: Once | INTRAMUSCULAR | Status: AC
  Administered 2022-12-10: 2 mg via INTRAVENOUS

## 2022-12-10 MED ORDER — DEXAMETHASONE SODIUM PHOSPHATE 10 MG/ML IJ SOLN
10.0000 mg | Freq: Once | INTRAMUSCULAR | Status: AC
  Administered 2022-12-10: 10 mg

## 2022-12-10 MED ORDER — LACTATED RINGERS IV SOLN: Freq: Once | INTRAVENOUS | Status: AC

## 2022-12-10 MED ORDER — ROPIVACAINE HCL 2 MG/ML IJ SOLN
INTRAMUSCULAR | Status: AC
  Filled 2022-12-10: qty 20

## 2022-12-10 MED ORDER — DEXAMETHASONE SODIUM PHOSPHATE 10 MG/ML IJ SOLN
INTRAMUSCULAR | Status: AC
  Filled 2022-12-10: qty 1

## 2022-12-10 NOTE — Patient Instructions (Signed)

## 2022-12-10 NOTE — Progress Notes (Signed)
PROVIDER NOTE: Interpretation of information contained herein should be left to medically-trained personnel. Specific patient instructions are provided elsewhere under "Patient Instructions" section of medical record. This document was created in part using STT-dictation technology, any transcriptional errors that may result from this process are unintentional.  Patient: Suzanne Spears Type: Established DOB: 08/18/1960 MRN: [id number] PCP: Sherlene Shams, MD  Service: Procedure DOS: 12/10/2022 Setting: Ambulatory Location: Ambulatory outpatient facility Delivery: Face-to-face Provider: Edward Jolly, MD Specialty: Interventional Pain Management Specialty designation: 09 Location: Outpatient facility Ref. Prov.: Edward Jolly, MD       Interventional Therapy   Procedure: Cervical Facet Medial Branch Block(s) #2  Laterality: Right  Level: C4, C5, C6, and C7 Medial Branch Level(s). Injecting these levels blocks the C4-5, C5-6, and C6-7 cervical facet joints.  Imaging: Fluoroscopic guidance Anesthesia: Local anesthesia (1-2% Lidocaine) Anxiolysis: IV Versed DOS: 12/10/2022  Performed by: Edward Jolly, MD  Purpose: Diagnostic/Therapeutic Indications: Cervicalgia (cervical spine axial pain) severe enough to impact quality of life or function. 1. Cervical facet joint syndrome   2. Cervical spondylosis   3. Chronic pain syndrome    NAS-11 Pain score:   Pre-procedure: 6 /10   Post-procedure: 0-No pain/10     Position / Prep / Materials:  Position: Prone. Head in cradle. C-spine slightly flexed. Prep solution: DuraPrep (Iodine Povacrylex [0.7% available iodine] and Isopropyl Alcohol, 74% w/w) Prep Area: Posterior Cervico-thoracic Region. From occipital ridge to tip of scapula, and from shoulder to shoulder. Entire posterior and lateral neck surface. Materials:  Tray: Block Needle(s):  Type: Spinal  Gauge (G): 22  Length: 3.5-in  Qty:  2      H&P (Pre-op Assessment):  Ms. Quinlin  is a 62 y.o. (year old), female patient, seen today for interventional treatment. She  has a past surgical history that includes Cesarean section; Appendectomy (2009); turbinectomy (2009); ganglionic cyst (remote); tendon release surgery (Left); Colonoscopy; Tonsillectomy; Anterior cervical decomp/discectomy fusion (N/A, 10/21/2012); bi-ventricular implantable cardioverter defibrillator (N/A, 06/15/2014); blood clot removed from left top hand; Thoracotomy (Left, 11/30/2014); Epicardial pacing lead placement (N/A, 11/30/2014); Cardiac catheterization (01/13/05/2015); Cardiac catheterization; Esophagogastroduodenoscopy (egd) with propofol (N/A, 04/07/2017); Breast biopsy (Left, 12/17/2018); Breast biopsy (Left, 12/17/2018); Colonoscopy with propofol (N/A, 07/12/2021); and ICD GENERATOR CHANGEOUT (N/A, 10/24/2021). Ms. Bartch has a current medication list which includes the following prescription(s): albuterol, albuterol, baclofen, cephalexin, cetirizine, cyanocobalamin, dapagliflozin propanediol, duloxetine, elderberry, entresto, ezetimibe, fluticasone, furosemide, gabapentin, multiple minerals-vitamins, naproxen, rosuvastatin, spironolactone, syringe 3cc/25gx1", tramadol, trazodone, triamcinolone ointment, ubrelvy, valacyclovir, venlafaxine xr, and doxycycline. Her primarily concern today is the Neck Pain  Initial Vital Signs:  Pulse/HCG Rate: 86  Temp: (!) 97.2 F (36.2 C) Resp: 16 BP: (!) 140/67 SpO2: 100 %  BMI: Estimated body mass index is 27.46 kg/m as calculated from the following:   Height as of this encounter: 5\' 5"  (1.651 m).   Weight as of this encounter: 165 lb (74.8 kg).  Risk Assessment: Allergies: Reviewed. She is allergic to adhesive [tape], carvedilol, metoprolol, penicillin g, lisinopril, other, prednisone, latex, and levofloxacin.  Allergy Precautions: None required Coagulopathies: Reviewed. None identified.  Blood-thinner therapy: None at this time Active Infection(s): Reviewed. None  identified. Ms. Vibbert is afebrile  Site Confirmation: Ms. Breer was asked to confirm the procedure and laterality before marking the site Procedure checklist: Completed Consent: Before the procedure and under the influence of no sedative(s), amnesic(s), or anxiolytics, the patient was informed of the treatment options, risks and possible complications. To fulfill our ethical and legal obligations, as recommended by  the American Medical Association's Code of Ethics, I have informed the patient of my clinical impression; the nature and purpose of the treatment or procedure; the risks, benefits, and possible complications of the intervention; the alternatives, including doing nothing; the risk(s) and benefit(s) of the alternative treatment(s) or procedure(s); and the risk(s) and benefit(s) of doing nothing. The patient was provided information about the general risks and possible complications associated with the procedure. These may include, but are not limited to: failure to achieve desired goals, infection, bleeding, organ or nerve damage, allergic reactions, paralysis, and death. In addition, the patient was informed of those risks and complications associated to Spine-related procedures, such as failure to decrease pain; infection (i.e.: Meningitis, epidural or intraspinal abscess); bleeding (i.e.: epidural hematoma, subarachnoid hemorrhage, or any other type of intraspinal or peri-dural bleeding); organ or nerve damage (i.e.: Any type of peripheral nerve, nerve root, or spinal cord injury) with subsequent damage to sensory, motor, and/or autonomic systems, resulting in permanent pain, numbness, and/or weakness of one or several areas of the body; allergic reactions; (i.e.: anaphylactic reaction); and/or death. Furthermore, the patient was informed of those risks and complications associated with the medications. These include, but are not limited to: allergic reactions (i.e.: anaphylactic or anaphylactoid  reaction(s)); adrenal axis suppression; blood sugar elevation that in diabetics may result in ketoacidosis or comma; water retention that in patients with history of congestive heart failure may result in shortness of breath, pulmonary edema, and decompensation with resultant heart failure; weight gain; swelling or edema; medication-induced neural toxicity; particulate matter embolism and blood vessel occlusion with resultant organ, and/or nervous system infarction; and/or aseptic necrosis of one or more joints. Finally, the patient was informed that Medicine is not an exact science; therefore, there is also the possibility of unforeseen or unpredictable risks and/or possible complications that may result in a catastrophic outcome. The patient indicated having understood very clearly. We have given the patient no guarantees and we have made no promises. Enough time was given to the patient to ask questions, all of which were answered to the patient's satisfaction. Ms. Shiley has indicated that she wanted to continue with the procedure. Attestation: I, the ordering provider, attest that I have discussed with the patient the benefits, risks, side-effects, alternatives, likelihood of achieving goals, and potential problems during recovery for the procedure that I have provided informed consent. Date  Time: 12/10/2022 11:14 AM   Pre-Procedure Preparation:  Monitoring: As per clinic protocol. Respiration, ETCO2, SpO2, BP, heart rate and rhythm monitor placed and checked for adequate function Safety Precautions: Patient was assessed for positional comfort and pressure points before starting the procedure. Time-out: I initiated and conducted the "Time-out" before starting the procedure, as per protocol. The patient was asked to participate by confirming the accuracy of the "Time Out" information. Verification of the correct person, site, and procedure were performed and confirmed by me, the nursing staff, and the  patient. "Time-out" conducted as per Joint Commission's Universal Protocol (UP.01.01.01). Time: 1145 Start Time: 1145 hrs.  Description/Narrative of Procedure:          Laterality: See above. Targeted Levels: See above.  Rationale (medical necessity): procedure needed and proper for the diagnosis and/or treatment of the patient's medical symptoms and needs. Procedural Technique Safety Precautions: Aspiration looking for blood return was conducted prior to all injections. At no point did we inject any substances, as a needle was being advanced. No attempts were made at seeking any paresthesias. Safe injection practices and needle disposal  techniques used. Medications properly checked for expiration dates. SDV (single dose vial) medications used. Description of the Procedure: Protocol guidelines were followed. The patient was assisted into a comfortable position. The target area was identified and the area prepped in the usual manner. Skin & deeper tissues infiltrated with local anesthetic. Appropriate amount of time allowed to pass for local anesthetics to take effect. The procedure needles were then advanced to the target area. Proper needle placement secured. Negative aspiration confirmed. Solution injected in intermittent fashion, asking for systemic symptoms every 0.5cc of injectate. The needles were then removed and the area cleansed, making sure to leave some of the prepping solution back to take advantage of its long term bactericidal properties.  Technical description of process:   C4 Medial Branch Nerve Block (MBB): The target area for the C4 dorsal medial articular branch is the lateral concave waist of the articular pillar of C4. Under fluoroscopic guidance, a Quincke needle was inserted until contact was made with os over the postero-lateral aspect of the articular pillar of C4 (target area). After negative aspiration for blood, 2mL of the nerve block solution was injected without difficulty  or complication. The needle was removed intact. C5 Medial Branch Nerve Block (MBB): The target area for the C5 dorsal medial articular branch is the lateral concave waist of the articular pillar of C5. Under fluoroscopic guidance, a Quincke needle was inserted until contact was made with os over the postero-lateral aspect of the articular pillar of C5 (target area). After negative aspiration for blood,6mL of the nerve block solution was injected without difficulty or complication. The needle was removed intact. C6 Medial Branch Nerve Block (MBB): The target area for the C6 dorsal medial articular branch is the lateral concave waist of the articular pillar of C6. Under fluoroscopic guidance, a Quincke needle was inserted until contact was made with os over the postero-lateral aspect of the articular pillar of C6 (target area). After negative aspiration for blood, 2mL of the nerve block solution was injected without difficulty or complication. The needle was removed intact. C7 Medial Branch Nerve Block (MBB): The target for the C7 dorsal medial articular branch lies on the superior-lateral tip of the C7 transverse process. Under fluoroscopic guidance, a Quincke needle was inserted until contact was made with os over the postero-lateral aspect of the articular pillar of C7 (target area). After negative aspiration for blood, 2mL of the nerve block solution was injected without difficulty or complication. The needle was removed intact.   Once the entire procedure was completed, the treated area was cleaned, making sure to leave some of the prepping solution back to take advantage of its long term bactericidal properties.  Anatomy Reference Guide:      Facet Joint Innervation  C1-2 Third occipital Nerve (TON)  C2-3 TON, C3  Medial Branch  C3-4 C3, C4         "          "  C4-5 C4, C5         "          "  C5-6 C5, C6         "          "  C6-7 C6, C7         "          "  C7-T1 C7, C8         "           "  Cervical Facet Pain Pattern overlap:   Vitals:   12/10/22 1137 12/10/22 1147 12/10/22 1151 12/10/22 1158  BP: 133/69 125/70 135/80 132/70  Pulse: 82 83 84 82  Resp: 12 19 18 16   Temp:      SpO2: 100% 97% 98% 100%  Weight:      Height:         Start Time: 1145 hrs. End Time: 1151 hrs.  Imaging Guidance (Spinal):          Type of Imaging Technique: Fluoroscopy Guidance (Spinal) Indication(s): Assistance in needle guidance and placement for procedures requiring needle placement in or near specific anatomical locations not easily accessible without such assistance. Exposure Time: Please see nurses notes. Contrast: None used. Fluoroscopic Guidance: I was personally present during the use of fluoroscopy. "Tunnel Vision Technique" used to obtain the best possible view of the target area. Parallax error corrected before commencing the procedure. "Direction-depth-direction" technique used to introduce the needle under continuous pulsed fluoroscopy. Once target was reached, antero-posterior, oblique, and lateral fluoroscopic projection used confirm needle placement in all planes. Images permanently stored in EMR. Interpretation: No contrast injected. I personally interpreted the imaging intraoperatively. Adequate needle placement confirmed in multiple planes. Permanent images saved into the patient's record.  Post-operative Assessment:  Post-procedure Vital Signs:  Pulse/HCG Rate: 82  Temp: (!) 97.2 F (36.2 C) Resp: 16 BP: 132/70 SpO2: 100 %  EBL: None  Complications: No immediate post-treatment complications observed by team, or reported by patient.  Note: The patient tolerated the entire procedure well. A repeat set of vitals were taken after the procedure and the patient was kept under observation following institutional policy, for this type of procedure. Post-procedural neurological assessment was performed, showing return to baseline, prior to discharge. The patient was  provided with post-procedure discharge instructions, including a section on how to identify potential problems. Should any problems arise concerning this procedure, the patient was given instructions to immediately contact us, at any time, without hesitation. In any case, we plan to contact the patient by telephone for a follow-up status report regarding this interventional procedure.  Comments:  No additional relevant information.  Plan of Care (POC)  Orders:  Orders Placed This Encounter  Procedures   DG PAIN CLINIC C-ARM 1-60 MIN NO REPORT    Intraoperative interpretation by procedural physician at [facility].    Standing Status:   Standing    Number of Occurrences:   1    Order Specific Question:   Reason for exam:    Answer:   Assistance in needle guidance and placement for procedures requiring needle placement in or near specific anatomical locations not easily accessible without such assistance.   Medications ordered for procedure: Meds ordered this encounter  Medications   lidocaine (XYLOCAINE) 2 % (with pres) injection 400 mg   lactated ringers infusion   midazolam (VERSED) injection 0.5-2 mg    Make sure Flumazenil is available in the pyxis when using this medication. If oversedation occurs, administer 0.2 mg IV over 15 sec. If after 45 sec no response, administer 0.2 mg again over 1 min; may repeat at 1 min intervals; not to exceed 4 doses (1 mg)   dexamethasone (DECADRON) injection 10 mg   ropivacaine (PF) 2 mg/mL (0.2%) (NAROPIN) injection 9 mL   Medications administered: We administered lidocaine, lactated ringers, midazolam, dexamethasone, and ropivacaine (PF) 2 mg/mL (0.2%).  See the medical record for exact dosing, route, and time of administration.  Follow-up plan:   Return  in about 1 month (around 01/13/2023) for PPE, F2F.       Right TON, C3, C4 MBNB 08/20/22, Right C4,5,6,7, MBNB 10/06/22, #2 12/10/22      Recent Visits Date Type Provider Dept   11/03/22 Office Visit Edward Jolly, MD [facility]  10/06/22 Procedure visit Edward Jolly, MD [facility]  09/17/22 Office Visit Edward Jolly, MD [facility]  Showing recent visits within past 90 days and meeting all other requirements Today's Visits Date Type Provider Dept  12/10/22 Procedure visit Edward Jolly, MD [facility]  Showing today's visits and meeting all other requirements Future Appointments Date Type Provider Dept  01/14/23 Appointment Edward Jolly, MD [facility]  Showing future appointments within next 90 days and meeting all other requirements  Disposition: Discharge home  Discharge (Date  Time): 12/10/2022; 1203 hrs.   Primary Care Physician: Sherlene Shams, MD Location: [facility] Note by: Edward Jolly, MD (TTS technology used. I apologize for any typographical errors that were not detected and corrected.) Date: 12/10/2022; Time: 3:27 PM  Disclaimer:  Medicine is not an Visual merchandiser. The only guarantee in medicine is that nothing is guaranteed. It is important to note that the decision to proceed with this intervention was based on the information collected from the patient. The Data and conclusions were drawn from the patient's questionnaire, the interview, and the physical examination. Because the information was provided in large part by the patient, it cannot be guaranteed that it has not been purposely or unconsciously manipulated. Every effort has been made to obtain as much relevant data as possible for this evaluation. It is important to note that the conclusions that lead to this procedure are derived in large part from the available data. Always take into account that the treatment will also be dependent on availability of resources and existing treatment guidelines, considered by other Pain Management Practitioners as being common knowledge and practice, at the time of the  intervention. For Medico-Legal purposes, it is also important to point out that variation in procedural techniques and pharmacological choices are the acceptable norm. The indications, contraindications, technique, and results of the above procedure should only be interpreted and judged by a Board-Certified Interventional Pain Specialist with extensive familiarity and expertise in the same exact procedure and technique.

## 2022-12-10 NOTE — Progress Notes (Signed)
Safety precautions to be maintained throughout the outpatient stay will include: orient to surroundings, keep bed in low position, maintain call bell within reach at all times, provide assistance with transfer out of bed and ambulation.  

## 2022-12-11 ENCOUNTER — Telehealth: Payer: Self-pay

## 2022-12-11 NOTE — Transitions of Care (Post Inpatient/ED Visit) (Signed)
pt was at [facility] ED on 12/08/22 withCP when taking a deep breath and cellulitis of lt lower leg; lt leg redness is better ; pt taking abx bid instead of tid but CP when takes deep breath is slightly better but still there. pt doesnot want to go back to ED and wants FU [facility]. Did warm transfer to [place] at [facility] to schedule appt and UC & ED precautions given and pt voiced understanding. Morrie Sheldon scheduled pt with Dr Birdie Sons on 12/12/22 at 2:40. Sending note to Dr Birdie Sons and Lorain Childes to Dr Darrick Huntsman as PCP.      12/11/2022  Name: Suzanne Spears MRN: [id number] DOB: [birth year]  Today's TOC FU Call Status: Today's TOC FU Call Status:: Successful TOC FU Call Completed TOC FU Call Complete Date: 12/11/22 Patient's Name and Date of Birth confirmed.  Transition Care Management Follow-up Telephone Call Date of Discharge: 12/08/22 Discharge Facility: [facility]) Type of Discharge: Emergency Department Reason for ED Visit: Other: (pt was at [facility] ED on 12/08/22 withCP when taking a deep breath and cellulitis of lt lower leg; lt leg rednessis better ; pt taking abx but CP when takes deep breath is slightly better but still there. pt doesnot want to go back to ED and wants FU  [facility].) How have you been since you were released from the hospital?: Better Any questions or concerns?: No  Items Reviewed: Did you receive and understand the discharge instructions provided?: Yes Medications obtained,verified, and reconciled?: Partial Review Completed Reason for Partial Mediation Review: reviewed meds given at ED Any new allergies since your discharge?: No Dietary orders reviewed?: NA Do you have support at home?: Yes Name of Support/Comfort Primary Source: Arlys John  Medications Reviewed Today: Medications Reviewed Today   Medications were not reviewed in this encounter     Home Care and Equipment/Supplies: Were Home Health Services Ordered?: NA Any new equipment or medical  supplies ordered?: NA  Functional Questionnaire: Do you need assistance with bathing/showering or dressing?: No Do you need assistance with meal preparation?: No Do you need assistance with eating?: No Do you have difficulty maintaining continence: No Do you need assistance with getting out of bed/getting out of a chair/moving?: No Do you have difficulty managing or taking your medications?: No  Follow up appointments reviewed: PCP Follow-up appointment confirmed?: No (did warm transfer to [facility] and  spoke with Morrie Sheldon to schedule appt with PCP.) MD Provider Line Number:[phone] Given: Yes Specialist Hospital Follow-up appointment confirmed?: NA Do you need transportation to your follow-up appointment?: No Do you understand care options if your condition(s) worsen?: Yes-patient verbalized understanding    SIGNATURE Lewanda Rife, LPN

## 2022-12-11 NOTE — Telephone Encounter (Signed)
Post procedure follow up.  LM 

## 2022-12-12 ENCOUNTER — Ambulatory Visit: Payer: No Typology Code available for payment source | Admitting: Family Medicine

## 2022-12-12 ENCOUNTER — Encounter: Payer: Self-pay | Admitting: Family Medicine

## 2022-12-12 VITALS — BP 118/80 | HR 88 | Temp 98.1°F | Ht 65.0 in | Wt 176.2 lb

## 2022-12-12 DIAGNOSIS — L0390 Cellulitis, unspecified: Secondary | ICD-10-CM | POA: Insufficient documentation

## 2022-12-12 DIAGNOSIS — L03116 Cellulitis of left lower limb: Secondary | ICD-10-CM

## 2022-12-12 DIAGNOSIS — M940 Chondrocostal junction syndrome [Tietze]: Secondary | ICD-10-CM | POA: Diagnosis not present

## 2022-12-12 MED ORDER — METHYLPREDNISOLONE ACETATE 40 MG/ML IJ SUSP
40.0000 mg | Freq: Once | INTRAMUSCULAR | Status: AC
Start: 2022-12-12 — End: 2022-12-12
  Administered 2022-12-12: 40 mg via INTRAMUSCULAR

## 2022-12-12 NOTE — Patient Instructions (Signed)
Nice to see you. If the Depo-Medrol is not beneficial please let us know and we can send you to physical therapy. If your cellulitis worsens again please let us know right away or get evaluated right away.

## 2022-12-12 NOTE — Assessment & Plan Note (Signed)
Symptoms and exam are most consistent with costochondritis.  She had a reassuring workup in the emergency department.  She is unable to use NSAIDs to a great degree given her cardiac history.  She is unable to tolerate oral prednisone.  Will give her a IM injection of Depo-Medrol 40 mg today in the office to see if this will help calm things down further.  She understands the potential for sleep issues with this.  If not improving we could have her do physical therapy.  She will let us know if not improving.

## 2022-12-12 NOTE — Progress Notes (Signed)
Marikay Alar, MD Phone: [phone]  Suzanne Spears is a 62 y.o. female who presents today for same-day visit.  Costochondritis: Patient developed chest discomfort on 12/08/2022.  She ended up in the emergency department.  Extensive workup was done revealing no emergent causes.  The ED physician felt as though this represented costochondritis and they gave her a dose of Toradol and a dose of dexamethasone.  She noted some benefit with that.  She notes sleeping on her side is aggravated it.  She notes it is a little bit better today.  She describes it as feeling like when she had pleurisy previously.  She had negative troponins and a negative D-dimer.  Patient notes she is not supposed to take NSAIDs and does not tolerate oral prednisone.  She notes she did okay with the dexamethasone IM.  This did affect her sleep though not nearly as much as oral prednisone.  Cellulitis: Patient noted left lower extremity cellulitis has improved significantly on the Keflex.  She had a negative ultrasound for DVT.  Social History   Tobacco Use  Smoking Status Never  Smokeless Tobacco Never    Current Outpatient Medications on File Prior to Visit  Medication Sig Dispense Refill   albuterol (PROVENTIL) (2.5 MG/3ML) 0.083% nebulizer solution Take 3 mLs (2.5 mg total) by nebulization every 6 (six) hours as needed for wheezing or shortness of breath. 150 mL 1   albuterol (VENTOLIN HFA) 108 (90 Base) MCG/ACT inhaler Inhale 1-2 puffs into the lungs every 6 (six) hours as needed for wheezing or shortness of breath. 8.5 g 2   baclofen (LIORESAL) 10 MG tablet Take 1 tablet (10 mg total) by mouth 3 (three) times daily. As needed for muscle spasm 90 tablet 5   cephALEXin (KEFLEX) 500 MG capsule Take 1 capsule (500 mg total) by mouth 3 (three) times daily for 7 days. 21 capsule 0   cetirizine (ZYRTEC) 10 MG chewable tablet Chew 10 mg by mouth daily.     cyanocobalamin (VITAMIN B12) 1000 MCG/ML injection Inject 1  mL (1,000 mcg total) into the muscle every 30 (thirty) days. 10 mL 1   dapagliflozin propanediol (FARXIGA) 10 MG TABS tablet TAKE 1 TABLET(10 MG) BY MOUTH DAILY 90 tablet 3   DULoxetine (CYMBALTA) 30 MG capsule Take 1 capsule (30 mg total) by mouth daily. 90 capsule 1   Elderberry 500 MG CAPS Take 500 mg by mouth daily as needed.     ENTRESTO 24-26 MG Take 0.5 tablets by mouth 2 (two) times daily. 90 tablet 3   ezetimibe (ZETIA) 10 MG tablet Take 1 tablet (10 mg total) by mouth daily. 90 tablet 3   fluticasone (FLONASE) 50 MCG/ACT nasal spray USE 2 SPRAYS INTO BOTH NOSTRILS ONCE DAILY AS DIRECTED BY PHYSICIAN. 48 g 2   furosemide (LASIX) 20 MG tablet Take 1 tablet (20 mg total) by mouth as directed. Take 1 tablet daily. Take extra 20 mg as needed. 180 tablet 3   gabapentin (NEURONTIN) 100 MG capsule Take 1 capsule (100 mg total) by mouth 3 (three) times daily. 180 capsule 1   Multiple Minerals-Vitamins (YUMVS CALC-MAG-ZINC-VIT D PO) Take by mouth.     naproxen (NAPROSYN) 375 MG tablet Take 1 tablet (375 mg total) by mouth 2 (two) times daily with a meal for 7 days. 14 tablet 0   rosuvastatin (CRESTOR) 5 MG tablet Take 1 tablet (5 mg total) by mouth daily. 90 tablet 3   spironolactone (ALDACTONE) 25 MG tablet TAKE 1 TABLET(25  MG) BY MOUTH DAILY 90 tablet 1   Syringe/Needle, Disp, (SYRINGE 3CC/25GX1") 25G X 1" 3 ML MISC Use to inject 1 mL of vitamin B12 intramuscular every 30 days. 50 each 0   traMADol (ULTRAM) 50 MG tablet Take 50 mg by mouth every 6 (six) hours as needed.     traZODone (DESYREL) 50 MG tablet TAKE 1/2 TO 1 TABLET BY MOUTH AT BEDTIME AS NEEDED FOR SLEEP 90 tablet 1   triamcinolone ointment (KENALOG) 0.5 % Apply 1 Application topically 2 (two) times daily. Until resolved 80 g 0   UBRELVY 100 MG TABS Take 1 tablet by mouth daily.     valACYclovir (VALTREX) 1000 MG tablet Take 1 tablet (1,000 mg total) by mouth 2 (two) times daily as needed (fever blisters). 20 tablet 3   venlafaxine  XR (EFFEXOR-XR) 150 MG 24 hr capsule TAKE 1 CAPSULE BY MOUTH EVERY DAY WITH BREAKFAST 90 capsule 1   No current facility-administered medications on file prior to visit.     ROS see history of present illness  Objective  Physical Exam Vitals:   12/12/22 1428  BP: 118/80  Pulse: 88  Temp: 98.1 F (36.7 C)  SpO2: 98%    BP Readings from Last 3 Encounters:  12/12/22 118/80  12/10/22 132/70  12/08/22 114/68   Wt Readings from Last 3 Encounters:  12/12/22 176 lb 3.2 oz (79.9 kg)  12/10/22 165 lb (74.8 kg)  11/18/22 174 lb 3.2 oz (79 kg)    Physical Exam Constitutional:      General: She is not in acute distress.    Appearance: She is not diaphoretic.  Cardiovascular:     Rate and Rhythm: Normal rate and regular rhythm.     Heart sounds: Normal heart sounds.  Pulmonary:     Effort: Pulmonary effort is normal.     Breath sounds: Normal breath sounds.  Chest:     Comments: Patient is tender on bilateral costochondral joints Skin:    General: Skin is warm and dry.       Neurological:     Mental Status: She is alert.      Assessment/Plan: Please see individual problem list.  Costochondritis Assessment & Plan: Symptoms and exam are most consistent with costochondritis.  She had a reassuring workup in the emergency department.  She is unable to use NSAIDs to a great degree given her cardiac history.  She is unable to tolerate oral prednisone.  Will give her a IM injection of Depo-Medrol 40 mg today in the office to see if this will help calm things down further.  She understands the potential for sleep issues with this.  If not improving we could have her do physical therapy.  She will let us know if not improving.  Orders: -     methylPREDNISolone Acetate  Cellulitis of left lower extremity Assessment & Plan: Appears to have improved.  She will finish her course of Keflex.  If it worsens at all she will let us know.     Return if symptoms worsen or fail to  improve.   Marikay Alar, MD [facility]

## 2022-12-12 NOTE — Assessment & Plan Note (Signed)
Appears to have improved.  She will finish her course of Keflex.  If it worsens at all she will let us know.

## 2022-12-24 ENCOUNTER — Telehealth: Payer: Self-pay | Admitting: Cardiovascular Disease

## 2022-12-24 DIAGNOSIS — Z0279 Encounter for issue of other medical certificate: Secondary | ICD-10-CM

## 2022-12-24 NOTE — Telephone Encounter (Signed)
Forms placed in Dr. Windell Hummingbird office for review.

## 2022-12-24 NOTE — Telephone Encounter (Signed)
Received forms for FMLA. Billing sheet faxed to billing. Placed completion forms in Dr. Windell Hummingbird nurse box. Patient paid $29 processing fee.

## 2023-01-05 ENCOUNTER — Ambulatory Visit: Payer: No Typology Code available for payment source | Attending: Internal Medicine

## 2023-01-05 DIAGNOSIS — I5022 Chronic systolic (congestive) heart failure: Secondary | ICD-10-CM

## 2023-01-05 DIAGNOSIS — Z95810 Presence of automatic (implantable) cardiac defibrillator: Secondary | ICD-10-CM

## 2023-01-06 ENCOUNTER — Telehealth: Payer: Self-pay

## 2023-01-06 NOTE — Telephone Encounter (Signed)
 Remote ICM transmission received.  Attempted call to patient regarding ICM remote transmission and left detailed message per DPR.  Left ICM phone number and advised to return call for any fluid symptoms or questions. Next ICM remote transmission scheduled 01/19/2023.

## 2023-01-06 NOTE — Telephone Encounter (Signed)
Completed forms scanned into chart. Contacted patient to inform her paperwork ready to be picked up. Said she will come by one day this week. Completed forms placed up front.

## 2023-01-06 NOTE — Progress Notes (Signed)
EPIC Encounter for ICM Monitoring  Patient Name: Suzanne Spears is a 62 y.o. female Date: 01/06/2023 Primary Care Physican: Sherlene Shams, MD Primary Cardiologist: Mariah Milling Electrophysiologist: Joycelyn Schmid Pacing:  98.4%    06/04/2022 Weight: 159 lbs 07/25/2022 Office Weight: 165 lbs 10/29/2022 Weight: 170 lbs            Attempted call to patient and unable to reach.  Left detailed message per DPR regarding transmission. Transmission reviewed.    Diet: Does not adhere to low salt diet   OptiVol Thoracic impedance suggesting possible fluid accumulation starting 11/4.  Also decreased impedance 10/23-11/2.      Prescribed:  Furosemide 20 mg Take1 tablet (20 mg) once daily. May take extra 20 mg as needed for weight gain or swelling.   Spironolactone 25 mg take 1 tablet daily   Labs: 08/14/2022 Creatinine 1.36, BUN 26, Potassium 4.2, Sodium 138, GFR 44  07/25/2022 Creatinine 1.25, BUN 20, Potassium 4.7, Sodium 138, GFR 49  A complete set of results can be found in Results Review.   Recommendations:  Left voice mail with ICM number and encouraged to call if experiencing any fluid symptoms.   Follow-up plan: ICM clinic phone appointment on 01/19/2023 to recheck fluid levels.  91 day device clinic remote transmission 01/27/2023.     EP/Cardiology Office Visits:  01/29/2023 with Dr Graciela Husbands.   Copy of ICM check sent to Dr. Graciela Husbands.    3 month ICM trend: 01/05/2023.    12-14 Month ICM trend:     Karie Soda, RN 01/06/2023 4:33 PM

## 2023-01-14 ENCOUNTER — Ambulatory Visit
Payer: No Typology Code available for payment source | Attending: Student in an Organized Health Care Education/Training Program | Admitting: Student in an Organized Health Care Education/Training Program

## 2023-01-14 ENCOUNTER — Encounter: Payer: Self-pay | Admitting: Student in an Organized Health Care Education/Training Program

## 2023-01-14 VITALS — BP 144/81 | HR 89 | Temp 97.2°F | Resp 16 | Ht 65.5 in | Wt 176.0 lb

## 2023-01-14 DIAGNOSIS — G894 Chronic pain syndrome: Secondary | ICD-10-CM | POA: Insufficient documentation

## 2023-01-14 DIAGNOSIS — M47812 Spondylosis without myelopathy or radiculopathy, cervical region: Secondary | ICD-10-CM | POA: Diagnosis not present

## 2023-01-14 NOTE — Progress Notes (Signed)
PROVIDER NOTE: Information contained herein reflects review and annotations entered in association with encounter. Interpretation of such information and data should be left to medically-trained personnel. Information provided to patient can be located elsewhere in the medical record under "Patient Instructions". Document created using STT-dictation technology, any transcriptional errors that may result from process are unintentional.    Patient: Suzanne Spears  Service Category: E/M  Provider: Edward Jolly, MD  DOB: 06-11-1960  DOS: 01/14/2023  Referring Provider: Sherlene Shams, MD  MRN: [id number]  Specialty: Interventional Pain Management  PCP: Sherlene Shams, MD  Type: Established Patient  Setting: Ambulatory outpatient    Location: Office  Delivery: Face-to-face     HPI  [name], a 62 y.o. year old female, is here today because of her Cervical facet joint syndrome [M47.812]. Suzanne Spears primary complain today is Neck Pain (Right )  Pain Assessment: Severity of Chronic pain is reported as a 5 /10. Location: Neck Right/into right shoulder. Onset: More than a month ago. Quality: Burning, Discomfort, Contraction. Timing: Constant. Modifying factor(s): received TPI in head and shoulders for miagraines at Dr Margaretmary Eddy office. Vitals:  height is 5' 5.5" (1.664 m) and weight is 176 lb (79.8 kg). Her temporal temperature is 97.2 F (36.2 C) (abnormal). Her blood pressure is 144/81 (abnormal) and her pulse is 89. Her respiration is 16 and oxygen saturation is 100%.  BMI: Estimated body mass index is 28.84 kg/m as calculated from the following:   Height as of this encounter: 5' 5.5" (1.664 m).   Weight as of this encounter: 176 lb (79.8 kg). Last encounter: 11/03/2022. Last procedure: 12/10/2022.  Reason for encounter: post-procedure evaluation and assessment.   Suzanne Spears, who has a history of cervical facet medial branch nerve pain, reports a recurrence of symptoms following two sets  of diagnostic nerve blocks. The second block provided significant relief, but the patient notes that the pain is gradually returning (see results below)  In addition to the cervical facet pain, the patient also received an occipital nerve block from another provider. Following this procedure, the patient experienced a burning sensation and tenderness at the injection site,. The patient reports that the burning sensation has improved but is still present.  The patient does not report any other significant changes in their health history.        Post-procedure evaluation   Procedure: Cervical Facet Medial Branch Block(s) #2  Laterality: Right  Level: C4, C5, C6, and C7 Medial Branch Level(s). Injecting these levels blocks the C4-5, C5-6, and C6-7 cervical facet joints.  Imaging: Fluoroscopic guidance Anesthesia: Local anesthesia (1-2% Lidocaine) Anxiolysis: IV Versed DOS: 12/10/2022  Performed by: Edward Jolly, MD  Purpose: Diagnostic/Therapeutic Indications: Cervicalgia (cervical spine axial pain) severe enough to impact quality of life or function. 1. Cervical facet joint syndrome   2. Cervical spondylosis   3. Chronic pain syndrome    NAS-11 Pain score:   Pre-procedure: 6 /10   Post-procedure: 0-No pain/10      Effectiveness:  Initial hour after procedure: 100 %  Subsequent 4-6 hours post-procedure: 100 %  Analgesia past initial 6 hours: 100 % (numb til the next morning, then pain returned to a 1 for a few days and so on.  by the 14th day almost back to the same severity, rates at a 5.)  Ongoing improvement:  Analgesic:  back to baseline Function: Back to baseline ROM: Back to baseline      ROS  Constitutional: Denies any fever  or chills Gastrointestinal: No reported hemesis, hematochezia, vomiting, or acute GI distress Musculoskeletal:  Right cervical spine pain Neurological: No reported episodes of acute onset apraxia, aphasia, dysarthria, agnosia, amnesia, paralysis,  loss of coordination, or loss of consciousness  Medication Review  DULoxetine, Elderberry, Multiple Minerals-Vitamins, SYRINGE 3CC/25GX1", Ubrogepant, albuterol, baclofen, cetirizine, cyanocobalamin, dapagliflozin propanediol, ezetimibe, fluticasone, furosemide, gabapentin, rosuvastatin, sacubitril-valsartan, spironolactone, traMADol, traZODone, triamcinolone ointment, valACYclovir, and venlafaxine XR  History Review  Allergy: Suzanne Spears is allergic to adhesive [tape], carvedilol, metoprolol, penicillin g, lisinopril, other, prednisone, latex, and levofloxacin. Drug: Suzanne Spears  reports no history of drug use. Alcohol:  reports current alcohol use. Tobacco:  reports that she has never smoked. She has never used smokeless tobacco. Social: Suzanne Spears  reports that she has never smoked. She has never used smokeless tobacco. She reports current alcohol use. She reports that she does not use drugs. Medical:  has a past medical history of AICD (automatic cardioverter/defibrillator) present, Allergy, Anemia, Asthma, Breast calcifications on mammogram (06/21/2019), Cardiomyopathy, dilated, nonischemic (HCC), CKD (chronic kidney disease), stage III (HCC), Cough, Depression, Dizziness, GERD (gastroesophageal reflux disease), Heart failure with mid range ejection fraction (HFmrEF) (HCC), HFrEF (heart failure with reduced ejection fraction) (HCC), Hip pain, right (03/1998), History of 2019 novel coronavirus disease (COVID-19) (03/11/2019), History of bronchitis, History of shingles, Hyperlipidemia, Hypertension, Left bundle branch block (2008), Migraine, Mitral valve prolapse syndrome, Pericarditis (2008), Peripheral neuropathy, Pneumonia (2016), Presence of permanent cardiac pacemaker, and Spinal headache. Surgical: Suzanne Spears  has a past surgical history that includes Cesarean section; Appendectomy (2009); turbinectomy (2009); ganglionic cyst (remote); tendon release surgery (Left); Colonoscopy; Tonsillectomy;  Anterior cervical decomp/discectomy fusion (N/A, 10/21/2012); bi-ventricular implantable cardioverter defibrillator (N/A, 06/15/2014); blood clot removed from left top hand; Thoracotomy (Left, 11/30/2014); Epicardial pacing lead placement (N/A, 11/30/2014); Cardiac catheterization (01/13/05/2015); Cardiac catheterization; Esophagogastroduodenoscopy (egd) with propofol (N/A, 04/07/2017); Breast biopsy (Left, 12/17/2018); Breast biopsy (Left, 12/17/2018); Colonoscopy with propofol (N/A, 07/12/2021); and ICD GENERATOR CHANGEOUT (N/A, 10/24/2021). Family: family history includes Breast cancer in her maternal aunt; Breast cancer (age of onset: 37) in her maternal grandmother; Cancer in her paternal grandfather; Cancer (age of onset: 13) in her mother; Cancer (age of onset: 10) in her maternal grandmother; Diabetes in her maternal grandmother; Pneumonia in her father; Supraventricular tachycardia in her daughter.  Laboratory Chemistry Profile   Renal Lab Results  Component Value Date   BUN 22 12/08/2022   CREATININE 1.20 (H) 12/08/2022   BCR 13 07/17/2020   GFR 44.94 (L) 10/28/2018   GFRAA 54 (L) 11/04/2019   GFRNONAA 52 (L) 12/08/2022    Hepatic Lab Results  Component Value Date   AST 24 11/18/2022   ALT 19 11/18/2022   ALBUMIN 4.5 11/18/2022   ALKPHOS 115 11/18/2022   HCVAB NEGATIVE 08/17/2015   LIPASE 41 11/14/2022    Electrolytes Lab Results  Component Value Date   NA 138 12/08/2022   K 3.6 12/08/2022   CL 102 12/08/2022   CALCIUM 8.9 12/08/2022   MG 1.8 05/02/2020    Bone Lab Results  Component Value Date   VD25OH 43.92 12/08/2013    Inflammation (CRP: Acute Phase) (ESR: Chronic Phase) Lab Results  Component Value Date   CRP <1.0 06/05/2022   ESRSEDRATE 18 06/05/2022         Note: Above Lab results reviewed.  Recent Imaging Review  DG PAIN CLINIC C-ARM 1-60 MIN NO REPORT Fluoro was used, but no Radiologist interpretation will be provided.  Please refer to "NOTES" tab for  provider  progress note. Note: Reviewed        Physical Exam  General appearance: Well nourished, well developed, and well hydrated. In no apparent acute distress Mental status: Alert, oriented x 3 (person, place, & time)       Respiratory: No evidence of acute respiratory distress Eyes: PERLA Vitals: BP (!) 144/81 (BP Location: Right Arm, Patient Position: Sitting, Cuff Size: Normal)   Pulse 89   Temp (!) 97.2 F (36.2 C) (Temporal)   Resp 16   Ht 5' 5.5" (1.664 m)   Wt 176 lb (79.8 kg)   LMP 07/12/2016   SpO2 100%   BMI 28.84 kg/m  BMI: Estimated body mass index is 28.84 kg/m as calculated from the following:   Height as of this encounter: 5' 5.5" (1.664 m).   Weight as of this encounter: 176 lb (79.8 kg). Ideal: Ideal body weight: 58.2 kg (128 lb 3.2 oz) Adjusted ideal body weight: 66.8 kg (147 lb 5.1 oz)  Cervical Spine Area Exam  Skin & Axial Inspection: Well healed scar from previous spine surgery detected Alignment: Symmetrical Functional ROM: Pain restricted ROM, to the right Stability: No instability detected Muscle Tone/Strength: Functionally intact. No obvious neuro-muscular anomalies detected. Sensory (Neurological): Musculoskeletal pain pattern Palpation: No palpable anomalies                Assessment   Diagnosis Status  1. Cervical facet joint syndrome   2. Cervical spondylosis   3. Chronic pain syndrome    Responding Responding Controlled   Plan of Care  Cervical Facet Joint Pain   Chronic cervical facet joint pain at C4-C7, primarily on the right, has shown a positive response to two diagnostic cervical facet medial branch nerve blocks, indicating her candidacy for radiofrequency ablation (RFA). We discussed the RFA procedure details, including the use of larger needles, deeper insertion, and intra-procedural testing. She prefers increased IV sedation for comfort during the procedure. We will schedule RFA for the right C4-C7 with increased IV sedation.  Post-procedure, she is advised to rest for 5-7 days before resuming normal activities. It is essential she has a driver and fast after midnight before the procedure. Insurance approval for RFA will be submitted based on the positive diagnostic blocks.  Occipital Neuralgia   She has occipital neuralgia and recently received an occipital nerve block by Dr. Clelia Croft, experiencing a post-procedure burning sensation managed with aspirin. We discussed the possibility of a future occipital nerve block or RFA if the pain recurs. She should follow up with Dr. Clelia Croft if issues persist and consider a future occipital nerve block or RFA for recurring pain.  Trigger Finger   She has a trigger finger with a procedure planned for December 4th.   Suzanne Spears has a current medication list which includes the following long-term medication(s): albuterol, albuterol, cetirizine, duloxetine, ezetimibe, fluticasone, furosemide, gabapentin, rosuvastatin, spironolactone, trazodone, and venlafaxine xr.  Pharmacotherapy (Medications Ordered): No orders of the defined types were placed in this encounter.  Orders:  Orders Placed This Encounter  Procedures   Radiofrequency,Cervical    Standing Status:   Future    Standing Expiration Date:   04/16/2023    Scheduling Instructions:     Procedure: Cervical Facet Medial Branch RFA     Level: C4, C5, C6, and C7 Medial Branch Level(s). Injecting these levels blocks the C4-5, C5-6, and C6-7 cervical facet joints.      Imaging: Fluoroscopic guidance     Anesthesia: Local anesthesia (1-2% Lidocaine)  Anxiolysis: IV Versed    Order Specific Question:   Where will this procedure be performed?    Answer:   [facility]   Follow-up plan:   Return in about 4 weeks (around 02/11/2023) for Right C4, 5, 6, 7 RFA, ECT.      Right TON, C3, C4 MBNB 08/20/22, Right C4,5,6,7, MBNB 10/06/22, #2 12/10/22       Recent Visits Date Type Provider Dept  12/10/22 Procedure  visit Edward Jolly, MD [facility]  11/03/22 Office Visit Edward Jolly, MD [facility]  Showing recent visits within past 90 days and meeting all other requirements Today's Visits Date Type Provider Dept  01/14/23 Office Visit Edward Jolly, MD [facility]  Showing today's visits and meeting all other requirements Future Appointments No visits were found meeting these conditions. Showing future appointments within next 90 days and meeting all other requirements  I discussed the assessment and treatment plan with the patient. The patient was provided an opportunity to ask questions and all were answered. The patient agreed with the plan and demonstrated an understanding of the instructions.  Patient advised to call back or seek an in-person evaluation if the symptoms or condition worsens.  Duration of encounter: 20 minutes.  Total time on encounter, as per AMA guidelines included both the face-to-face and non-face-to-face time personally spent by the physician and/or other qualified health care professional(s) on the day of the encounter (includes time in activities that require the physician or other qualified health care professional and does not include time in activities normally performed by clinical staff). Physician's time may include the following activities when performed: Preparing to see the patient (e.g., pre-charting review of records, searching for previously ordered imaging, lab work, and nerve conduction tests) Review of prior analgesic pharmacotherapies. Reviewing PMP Interpreting ordered tests (e.g., lab work, imaging, nerve conduction tests) Performing post-procedure evaluations, including interpretation of diagnostic procedures Obtaining and/or reviewing separately obtained history Performing a medically appropriate examination and/or evaluation Counseling and educating the patient/family/caregiver Ordering medications, tests, or  procedures Referring and communicating with other health care professionals (when not separately reported) Documenting clinical information in the electronic or other health record Independently interpreting results (not separately reported) and communicating results to the patient/ family/caregiver Care coordination (not separately reported)  Note by: Edward Jolly, MD Date: 01/14/2023; Time: 3:02 PM

## 2023-01-14 NOTE — Patient Instructions (Signed)
______________________________________________________________________    General Risks and Possible Complications  Patient Responsibilities: It is important that you read this as it is part of your informed consent. It is our duty to inform you of the risks and possible complications associated with treatments offered to you. It is your responsibility as a patient to read this and to ask questions about anything that is not clear or that you believe was not covered in this document.  Patient's Rights: You have the right to refuse treatment. You also have the right to change your mind, even after initially having agreed to have the treatment done. However, under this last option, if you wait until the last second to change your mind, you may be charged for the materials used up to that point.  Introduction: Medicine is not an Visual merchandiser. Everything in Medicine, including the lack of treatment(s), carries the potential for danger, harm, or loss (which is by definition: Risk). In Medicine, a complication is a secondary problem, condition, or disease that can aggravate an already existing one. All treatments carry the risk of possible complications. The fact that a side effects or complications occurs, does not imply that the treatment was conducted incorrectly. It must be clearly understood that these can happen even when everything is done following the highest safety standards.  No treatment: You can choose not to proceed with the proposed treatment alternative. The "PRO(s)" would include: avoiding the risk of complications associated with the therapy. The "CON(s)" would include: not getting any of the treatment benefits. These benefits fall under one of three categories: diagnostic; therapeutic; and/or palliative. Diagnostic benefits include: getting information which can ultimately lead to improvement of the disease or symptom(s). Therapeutic benefits are those associated with the successful  treatment of the disease. Finally, palliative benefits are those related to the decrease of the primary symptoms, without necessarily curing the condition (example: decreasing the pain from a flare-up of a chronic condition, such as incurable terminal cancer).  General Risks and Complications: These are associated to most interventional treatments. They can occur alone, or in combination. They fall under one of the following six (6) categories: no benefit or worsening of symptoms; bleeding; infection; nerve damage; allergic reactions; and/or death. No benefits or worsening of symptoms: In Medicine there are no guarantees, only probabilities. No healthcare provider can ever guarantee that a medical treatment will work, they can only state the probability that it may. Furthermore, there is always the possibility that the condition may worsen, either directly, or indirectly, as a consequence of the treatment. Bleeding: This is more common if the patient is taking a blood thinner, either prescription or over the counter (example: Goody Powders, Fish oil, Aspirin, Garlic, etc.), or if suffering a condition associated with impaired coagulation (example: Hemophilia, cirrhosis of the liver, low platelet counts, etc.). However, even if you do not have one on these, it can still happen. If you have any of these conditions, or take one of these drugs, make sure to notify your treating physician. Infection: This is more common in patients with a compromised immune system, either due to disease (example: diabetes, cancer, human immunodeficiency virus [HIV], etc.), or due to medications or treatments (example: therapies used to treat cancer and rheumatological diseases). However, even if you do not have one on these, it can still happen. If you have any of these conditions, or take one of these drugs, make sure to notify your treating physician. Nerve Damage: This is more common when the treatment is  an invasive one, but it  can also happen with the use of medications, such as those used in the treatment of cancer. The damage can occur to small secondary nerves, or to large primary ones, such as those in the spinal cord and brain. This damage may be temporary or permanent and it may lead to impairments that can range from temporary numbness to permanent paralysis and/or brain death. Allergic Reactions: Any time a substance or material comes in contact with our body, there is the possibility of an allergic reaction. These can range from a mild skin rash (contact dermatitis) to a severe systemic reaction (anaphylactic reaction), which can result in death. Death: In general, any medical intervention can result in death, most of the time due to an unforeseen complication. ______________________________________________________________________      ______________________________________________________________________    Preparing for your procedure  Appointments: If you think you may not be able to keep your appointment, call 24-48 hours in advance to cancel. We need time to make it available to others.  During your procedure appointment there will be: No Prescription Refills. No disability issues to discussed. No medication changes or discussions.  Instructions: Food intake: Avoid eating anything solid for at least 8 hours prior to your procedure. Clear liquid intake: You may take clear liquids such as water up to 2 hours prior to your procedure. (No carbonated drinks. No soda.) Transportation: Unless otherwise stated by your physician, bring a driver. (Driver cannot be a Market researcher, Pharmacist, community, or any other form of public transportation.) Morning Medicines: Except for blood thinners, take all of your other morning medications with a sip of water. Make sure to take your heart and blood pressure medicines. If your blood pressure's lower number is above 100, the case will be rescheduled. Blood thinners: Make sure to stop your blood  thinners as instructed.  If you take a blood thinner, but were not instructed to stop it, call our office [phone] and ask to talk to a nurse. Not stopping a blood thinner prior to certain procedures could lead to serious complications. Diabetics on insulin: Notify the staff so that you can be scheduled 1st case in the morning. If your diabetes requires high dose insulin, take only  of your normal insulin dose the morning of the procedure and notify the staff that you have done so. Preventing infections: Shower with an antibacterial soap the morning of your procedure.  Build-up your immune system: Take 1000 mg of Vitamin C with every meal (3 times a day) the day prior to your procedure. Antibiotics: Inform the nursing staff if you are taking any antibiotics or if you have any conditions that may require antibiotics prior to procedures. (Example: recent joint implants)   Pregnancy: If you are pregnant make sure to notify the nursing staff. Not doing so may result in injury to the fetus, including death.  Sickness: If you have a cold, fever, or any active infections, call and cancel or reschedule your procedure. Receiving steroids while having an infection may result in complications. Arrival: You must be in the facility at least 30 minutes prior to your scheduled procedure. Tardiness: Your scheduled time is also the cutoff time. If you do not arrive at least 15 minutes prior to your procedure, you will be rescheduled.  Children: Do not bring any children with you. Make arrangements to keep them home. Dress appropriately: There is always a possibility that your clothing may get soiled. Avoid long dresses. Valuables: Do not bring any jewelry  or valuables.  Reasons to call and reschedule or cancel your procedure: (Following these recommendations will minimize the risk of a serious complication.) Surgeries: Avoid having procedures within 2 weeks of any surgery. (Avoid for 2 weeks before or after any  surgery). Flu Shots: Avoid having procedures within 2 weeks of a flu shots or . (Avoid for 2 weeks before or after immunizations). Barium: Avoid having a procedure within 7-10 days after having had a radiological study involving the use of radiological contrast. (Myelograms, Barium swallow or enema study). Heart attacks: Avoid any elective procedures or surgeries for the initial 6 months after a "Myocardial Infarction" (Heart Attack). Blood thinners: It is imperative that you stop these medications before procedures. Let us know if you if you take any blood thinner.  Infection: Avoid procedures during or within two weeks of an infection (including chest colds or gastrointestinal problems). Symptoms associated with infections include: Localized redness, fever, chills, night sweats or profuse sweating, burning sensation when voiding, cough, congestion, stuffiness, runny nose, sore throat, diarrhea, nausea, vomiting, cold or Flu symptoms, recent or current infections. It is specially important if the infection is over the area that we intend to treat. Heart and lung problems: Symptoms that may suggest an active cardiopulmonary problem include: cough, chest pain, breathing difficulties or shortness of breath, dizziness, ankle swelling, uncontrolled high or unusually low blood pressure, and/or palpitations. If you are experiencing any of these symptoms, cancel your procedure and contact your primary care physician for an evaluation.  Remember:  Regular Business hours are:  Monday to Thursday 8:00 AM to 4:00 PM  Provider's Schedule: Delano Metz, MD:  Procedure days: Tuesday and Thursday 7:30 AM to 4:00 PM  Edward Jolly, MD:  Procedure days: Monday and Wednesday 7:30 AM to 4:00 PM Last  Updated: 10/14/2022 ______________________________________________________________________     Radiofrequency Ablation Radiofrequency ablation is a procedure that is performed to relieve pain. The procedure is  often used for back, neck, or arm pain. Radiofrequency ablation involves the use of a machine that creates radio waves to make heat. During the procedure, the heat is applied to the nerve that carries the pain signal. The heat damages the nerve and interferes with the pain signal. Pain relief usually starts about 2 weeks after the procedure and lasts for 6 months to 1 year. Tell a health care provider about: Any allergies you have. All medicines you are taking, including vitamins, herbs, eye drops, creams, and over-the-counter medicines. Any problems you or family members have had with anesthetic medicines. Any bleeding problems you have. Any surgeries you have had. Any medical conditions you have. Whether you are pregnant or may be pregnant. What are the risks? Generally, this is a safe procedure. However, problems may occur, including: Pain or soreness at the injection site. Allergic reaction to medicines given during the procedure. Bleeding. Infection at the injection site. Damage to nerves or blood vessels. What happens before the procedure? When to stop eating and drinking Follow instructions from your health care provider about what you may eat and drink before your procedure. These may include: 8 hours before the procedure Stop eating most foods. Do not eat meat, fried foods, or fatty foods. Eat only light foods, such as toast or crackers. All liquids are okay except energy drinks and alcohol. 6 hours before the procedure Stop eating. Drink only clear liquids, such as water, clear fruit juice, black coffee, plain tea, and sports drinks. Do not drink energy drinks or alcohol. 2 hours before the  procedure Stop drinking all liquids. You may be allowed to take medicine with small sips of water. If you do not follow your health care provider's instructions, your procedure may be delayed or canceled. Medicines Ask your health care provider about: Changing or stopping your regular  medicines. This is especially important if you are taking diabetes medicines or blood thinners. Taking medicines such as aspirin and ibuprofen. These medicines can thin your blood. Do not take these medicines unless your health care provider tells you to take them. Taking over-the-counter medicines, vitamins, herbs, and supplements. General instructions Ask your health care provider what steps will be taken to help prevent infection. These steps may include: Removing hair at the procedure site. Washing skin with a germ-killing soap. Taking antibiotic medicine. If you will be going home right after the procedure, plan to have a responsible adult: Take you home from the hospital or clinic. You will not be allowed to drive. Care for you for the time you are told. What happens during the procedure?  You will be awake during the procedure. You will need to be able to talk with the health care provider during the procedure. An IV will be inserted into one of your veins. You will be given one or more of the following: A medicine to help you relax (sedative). A medicine to numb the area (local anesthetic). Your health care provider will insert a radiofrequency needle into the area to be treated. This is done with the help of fluoroscopy. A wire that carries the radio waves (electrode) will be put through the radiofrequency needle. An electrical pulse will be sent through the electrode to verify the correct nerve that is causing your pain. You will feel a tingling sensation, and you may have muscle twitching. The tissue around the needle tip will be heated by an electric current that comes from the radiofrequency machine. This will numb the nerves. The needle will be removed. A bandage (dressing) will be put on the insertion area. The procedure may vary among health care providers and hospitals. What happens after the procedure? Your blood pressure, heart rate, breathing rate, and blood oxygen level  will be monitored until you leave the hospital or clinic. Return to your normal activities as told by your health care provider. Ask your health care provider what activities are safe for you. If you were given a sedative during the procedure, it can affect you for several hours. Do not drive or operate machinery until your health care provider says that it is safe. Summary Radiofrequency ablation is a procedure that is performed to relieve pain. The procedure is often used for back, neck, or arm pain. Radiofrequency ablation involves the use of a machine that creates radio waves to make heat. Plan to have a responsible adult take you home from the hospital or clinic. Do not drive or operate machinery until your health care provider says that it is safe. Return to your normal activities as told by your health care provider. Ask your health care provider what activities are safe for you. This information is not intended to replace advice given to you by your health care provider. Make sure you discuss any questions you have with your health care provider. Document Revised: 07/31/2020 Document Reviewed: 07/31/2020 Elsevier Patient Education  2024 [employer].

## 2023-01-14 NOTE — Progress Notes (Signed)
Safety precautions to be maintained throughout the outpatient stay will include: orient to surroundings, keep bed in low position, maintain call bell within reach at all times, provide assistance with transfer out of bed and ambulation.  

## 2023-01-15 ENCOUNTER — Telehealth: Payer: Self-pay | Admitting: Student in an Organized Health Care Education/Training Program

## 2023-01-15 NOTE — Telephone Encounter (Signed)
Patient notified

## 2023-01-15 NOTE — Telephone Encounter (Signed)
Patient is scheduled for RFA in Dec. She is having a finger release procedure with Dr Rosita Kea the week before. She wants to know if this will interfere with her RFA? Please advise patient

## 2023-01-19 ENCOUNTER — Ambulatory Visit: Payer: No Typology Code available for payment source | Attending: Internal Medicine

## 2023-01-19 DIAGNOSIS — I5022 Chronic systolic (congestive) heart failure: Secondary | ICD-10-CM

## 2023-01-19 DIAGNOSIS — Z95810 Presence of automatic (implantable) cardiac defibrillator: Secondary | ICD-10-CM

## 2023-01-20 NOTE — Progress Notes (Signed)
EPIC Encounter for ICM Monitoring  Patient Name: Suzanne Spears is a 62 y.o. female Date: 01/20/2023 Primary Care Physican: Sherlene Shams, MD Primary Cardiologist: Mariah Milling Electrophysiologist: Joycelyn Schmid Pacing:  98.3%    06/04/2022 Weight: 159 lbs 07/25/2022 Office Weight: 165 lbs 10/29/2022 Weight: 170 lbs            Transmission reviewed.    Diet: Does not adhere to low salt diet   OptiVol Thoracic impedance suggesting fluid levels returned to normal.      Prescribed:  Furosemide 20 mg Take1 tablet (20 mg) once daily. May take extra 20 mg as needed for weight gain or swelling.   Spironolactone 25 mg take 1 tablet daily   Labs: 08/14/2022 Creatinine 1.36, BUN 26, Potassium 4.2, Sodium 138, GFR 44  07/25/2022 Creatinine 1.25, BUN 20, Potassium 4.7, Sodium 138, GFR 49  A complete set of results can be found in Results Review.   Recommendations:  No changes.   Follow-up plan: ICM clinic phone appointment on 02/16/2023.  91 day device clinic remote transmission 01/27/2023.     EP/Cardiology Office Visits:  01/29/2023 with Dr Graciela Husbands.   Copy of ICM check sent to Dr. Graciela Husbands.    3 month ICM trend: 01/19/2023.    12-14 Month ICM trend:     Karie Soda, RN 01/20/2023 9:02 AM

## 2023-01-27 ENCOUNTER — Ambulatory Visit (INDEPENDENT_AMBULATORY_CARE_PROVIDER_SITE_OTHER): Payer: No Typology Code available for payment source

## 2023-01-27 DIAGNOSIS — I5022 Chronic systolic (congestive) heart failure: Secondary | ICD-10-CM

## 2023-01-27 DIAGNOSIS — I420 Dilated cardiomyopathy: Secondary | ICD-10-CM

## 2023-01-27 LAB — CUP PACEART REMOTE DEVICE CHECK
Battery Remaining Longevity: 82 mo
Battery Voltage: 3 V
Brady Statistic AP VP Percent: 1.33 %
Brady Statistic AP VS Percent: 0.05 %
Brady Statistic AS VP Percent: 97.3 %
Brady Statistic AS VS Percent: 1.32 %
Brady Statistic RA Percent Paced: 1.38 %
Brady Statistic RV Percent Paced: 8.44 %
Date Time Interrogation Session: 20241203033524
HighPow Impedance: 90 Ohm
Implantable Lead Connection Status: 753985
Implantable Lead Connection Status: 753985
Implantable Lead Connection Status: 753985
Implantable Lead Connection Status: 753985
Implantable Lead Implant Date: 20160421
Implantable Lead Implant Date: 20160421
Implantable Lead Implant Date: 20161006
Implantable Lead Implant Date: 20161006
Implantable Lead Location: 753858
Implantable Lead Location: 753858
Implantable Lead Location: 753859
Implantable Lead Location: 753860
Implantable Lead Model: 5071
Implantable Lead Model: 5071
Implantable Lead Model: 5076
Implantable Pulse Generator Implant Date: 20230831
Lead Channel Impedance Value: 323 Ohm
Lead Channel Impedance Value: 380 Ohm
Lead Channel Impedance Value: 4047 Ohm
Lead Channel Impedance Value: 4047 Ohm
Lead Channel Impedance Value: 456 Ohm
Lead Channel Impedance Value: 570 Ohm
Lead Channel Pacing Threshold Amplitude: 0.5 V
Lead Channel Pacing Threshold Amplitude: 1 V
Lead Channel Pacing Threshold Pulse Width: 0.4 ms
Lead Channel Pacing Threshold Pulse Width: 0.6 ms
Lead Channel Sensing Intrinsic Amplitude: 21.625 mV
Lead Channel Sensing Intrinsic Amplitude: 21.625 mV
Lead Channel Sensing Intrinsic Amplitude: 3.125 mV
Lead Channel Sensing Intrinsic Amplitude: 3.125 mV
Lead Channel Setting Pacing Amplitude: 1 V
Lead Channel Setting Pacing Amplitude: 1.5 V
Lead Channel Setting Pacing Amplitude: 1.5 V
Lead Channel Setting Pacing Pulse Width: 0.4 ms
Lead Channel Setting Pacing Pulse Width: 0.6 ms
Lead Channel Setting Sensing Sensitivity: 0.3 mV
Zone Setting Status: 755011
Zone Setting Status: 755011

## 2023-01-28 ENCOUNTER — Telehealth: Payer: Self-pay | Admitting: Internal Medicine
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
## 2023-01-28 NOTE — Telephone Encounter (Signed)
Received paperwork from [employer] disability. Left voicemail to advise patient of $29 proc fee needed and forms.

## 2023-01-29 ENCOUNTER — Ambulatory Visit
Admission: RE | Admit: 2023-01-29 | Discharge: 2023-01-29 | Disposition: A | Discharge status: Home / Self Care | Payer: No Typology Code available for payment source | Source: Ambulatory Visit | Attending: Internal Medicine | Admitting: Internal Medicine

## 2023-01-29 ENCOUNTER — Encounter: Payer: Self-pay | Admitting: Internal Medicine

## 2023-01-29 ENCOUNTER — Ambulatory Visit: Payer: No Typology Code available for payment source | Attending: Internal Medicine | Admitting: Internal Medicine

## 2023-01-29 VITALS — BP 102/84 | HR 88 | Ht 65.5 in | Wt 179.2 lb

## 2023-01-29 DIAGNOSIS — Z0279 Encounter for issue of other medical certificate: Secondary | ICD-10-CM

## 2023-01-29 DIAGNOSIS — I10 Essential (primary) hypertension: Secondary | ICD-10-CM | POA: Diagnosis not present

## 2023-01-29 DIAGNOSIS — M79604 Pain in right leg: Secondary | ICD-10-CM | POA: Diagnosis present

## 2023-01-29 DIAGNOSIS — M7989 Other specified soft tissue disorders: Secondary | ICD-10-CM | POA: Diagnosis present

## 2023-01-29 NOTE — Patient Instructions (Signed)
Medication Instructions:  Your physician recommends that you continue on your current medications as directed. Please refer to the Current Medication list given to you today.  *If you need a refill on your cardiac medications before your next appointment, please call your pharmacy*   Lab Work: None ordered.  If you have labs (blood work) drawn today and your tests are completely normal, you will receive your results only by: MyChart Message (if you have MyChart) OR A paper copy in the mail If you have any lab test that is abnormal or we need to change your treatment, we will call you to review the results.   Testing/Procedures: Your physician has requested that you have a lower or upper extremity venous duplex. This test is an ultrasound of the veins in the legs or arms. It looks at venous blood flow that carries blood from the heart to the legs or arms. Allow one hour for a Lower Venous exam. Allow thirty minutes for an Upper Venous exam. There are no restrictions or special instructions.  Please note: We ask at that you not bring children with you during ultrasound (echo/ vascular) testing. Due to room size and safety concerns, children are not allowed in the ultrasound rooms during exams. Our front office staff cannot provide observation of children in our lobby area while testing is being conducted. An adult accompanying a patient to their appointment will only be allowed in the ultrasound room at the discretion of the ultrasound technician under special circumstances. We apologize for any inconvenience.    Follow-Up: At [facility], you and your health needs are our priority.  As part of our continuing mission to provide you with exceptional heart care, we have created designated Provider Care Teams.  These Care Teams include your primary Cardiologist (physician) and Advanced Practice Providers (APPs -  Physician Assistants and Nurse Practitioners) who all work together to  provide you with the care you need, when you need it.  We recommend signing up for the patient portal called "MyChart".  Sign up information is provided on this After Visit Summary.  MyChart is used to connect with patients for Virtual Visits (Telemedicine).  Patients are able to view lab/test results, encounter notes, upcoming appointments, etc.  Non-urgent messages can be sent to your provider as well.   To learn more about what you can do with MyChart, go to [email or web address].    Your next appointment:   12 months with Dr Graciela Husbands

## 2023-01-29 NOTE — Telephone Encounter (Signed)
Patient stopped by and completed [employer] forms for FMLA. Billing sheet faxed to billing. Placed completion forms in Dr. Odessa Fleming nurse box. Patient paid $29 cash processing fee.

## 2023-02-02 ENCOUNTER — Ambulatory Visit
Payer: No Typology Code available for payment source | Attending: Student in an Organized Health Care Education/Training Program | Admitting: Student in an Organized Health Care Education/Training Program

## 2023-02-02 ENCOUNTER — Ambulatory Visit
Admission: RE | Admit: 2023-02-02 | Discharge: 2023-02-02 | Disposition: A | Discharge status: Home / Self Care | Payer: No Typology Code available for payment source | Source: Ambulatory Visit | Attending: Student in an Organized Health Care Education/Training Program | Admitting: Student in an Organized Health Care Education/Training Program

## 2023-02-02 ENCOUNTER — Encounter: Payer: Self-pay | Admitting: Student in an Organized Health Care Education/Training Program

## 2023-02-02 DIAGNOSIS — G894 Chronic pain syndrome: Secondary | ICD-10-CM | POA: Insufficient documentation

## 2023-02-02 DIAGNOSIS — M47812 Spondylosis without myelopathy or radiculopathy, cervical region: Secondary | ICD-10-CM | POA: Insufficient documentation

## 2023-02-02 MED ORDER — FENTANYL CITRATE (PF) 100 MCG/2ML IJ SOLN
25.0000 ug | INTRAMUSCULAR | Status: DC | PRN
  Administered 2023-02-02: 50 ug via INTRAVENOUS

## 2023-02-02 MED ORDER — LACTATED RINGERS IV SOLN: Freq: Once | INTRAVENOUS | Status: AC

## 2023-02-02 MED ORDER — DEXAMETHASONE SODIUM PHOSPHATE 10 MG/ML IJ SOLN
INTRAMUSCULAR | Status: AC
  Filled 2023-02-02: qty 1

## 2023-02-02 MED ORDER — LIDOCAINE HCL 2 % IJ SOLN
20.0000 mL | Freq: Once | INTRAMUSCULAR | Status: AC
  Administered 2023-02-02: 200 mg

## 2023-02-02 MED ORDER — LIDOCAINE HCL (PF) 2 % IJ SOLN
INTRAMUSCULAR | Status: AC
  Filled 2023-02-02: qty 10

## 2023-02-02 MED ORDER — MIDAZOLAM HCL 2 MG/2ML IJ SOLN
0.5000 mg | Freq: Once | INTRAMUSCULAR | Status: AC
  Administered 2023-02-02: 1.5 mg via INTRAVENOUS

## 2023-02-02 MED ORDER — DEXAMETHASONE SODIUM PHOSPHATE 10 MG/ML IJ SOLN
20.0000 mg | Freq: Once | INTRAMUSCULAR | Status: AC
  Administered 2023-02-02: 10 mg

## 2023-02-02 MED ORDER — MIDAZOLAM HCL 5 MG/5ML IJ SOLN
INTRAMUSCULAR | Status: AC
  Filled 2023-02-02: qty 5

## 2023-02-02 MED ORDER — ROPIVACAINE HCL 2 MG/ML IJ SOLN
9.0000 mL | Freq: Once | INTRAMUSCULAR | Status: AC
  Administered 2023-02-02: 9 mL via PERINEURAL

## 2023-02-02 MED ORDER — ROPIVACAINE HCL 2 MG/ML IJ SOLN
INTRAMUSCULAR | Status: AC
  Filled 2023-02-02: qty 20

## 2023-02-02 MED ORDER — FENTANYL CITRATE (PF) 100 MCG/2ML IJ SOLN
INTRAMUSCULAR | Status: AC
  Filled 2023-02-02: qty 2

## 2023-02-02 NOTE — Progress Notes (Signed)
Safety precautions to be maintained throughout the outpatient stay will include: orient to surroundings, keep bed in low position, maintain call bell within reach at all times, provide assistance with transfer out of bed and ambulation.  

## 2023-02-02 NOTE — Progress Notes (Signed)
PROVIDER NOTE: Interpretation of information contained herein should be left to medically-trained personnel. Specific patient instructions are provided elsewhere under "Patient Instructions" section of medical record. This document was created in part using STT-dictation technology, any transcriptional errors that may result from this process are unintentional.  Patient: Suzanne Spears Type: Established DOB: [birth year] MRN: [id number] PCP: Sherlene Shams, MD  Service: Procedure DOS: 02/02/2023 Setting: Ambulatory Location: Ambulatory outpatient facility Delivery: Face-to-face Provider: Edward Jolly, MD Specialty: Interventional Pain Management Specialty designation: 09 Location: Outpatient facility Ref. Prov.: Edward Jolly, MD       Interventional Therapy   Procedure: Cervical Facet, Medial Branch Radiofrequency Ablation (RFA)  #1  Laterality: Right (-RT)  Level: C4, C5, C6, and C7 Medial Branch Level(s). Lesioning of these levels should completely denervate the C4-5, C5-6, and C6-7 cervical facet joints.  Imaging: Fluoroscopy-guided         Anesthesia: Local anesthesia (1-2% Lidocaine) Sedation: Moderate Sedation                       DOS: 02/02/2023  Performed by: Edward Jolly, MD  Purpose: Therapeutic/Palliative Indications: Chronic neck pain (cervicalgia) severe enough to impact quality of life or function. Rationale (medical necessity): procedure needed and proper for the diagnosis and/or treatment of Suzanne Spears's medical symptoms and needs. Indications: 1. Cervical facet joint syndrome   2. Cervical spondylosis   3. Chronic pain syndrome    Suzanne Spears has been dealing with the above chronic pain for longer than three months and has either failed to respond, was unable to tolerate, or simply did not get enough benefit from other more conservative therapies including, but not limited to: 1. Over-the-counter medications 2. Anti-inflammatory medications 3. Muscle relaxants 4.  Membrane stabilizers 5. Opioids 6. Physical therapy and/or chiropractic manipulation 7. Modalities (Heat, ice, etc.) 8. Invasive techniques such as nerve blocks. Suzanne Spears has attained more than 50% relief of the pain from a series of diagnostic injections conducted in separate occasions.  Pain Score: Pre-procedure: 5 /10 Post-procedure: 0-No pain/10    Target: Cervical medial nerve  Location: Posterolateral aspect of the waist of the cervical articular pillar. Region: Cervical  Approach: Paramedial  Type of procedure: Radiofrequency Neurolytic Ablation   Position / Prep / Materials:  Position: Prone with head of the table was raised to facilitate breathing.  Prep solution: ChloraPrep (2% chlorhexidine gluconate and 70% isopropyl alcohol) Prep Area: Entire Posterior Cervical Region  Materials:  Tray:  RFA (Radiofrequency) tray Needle(s):  Type: RFA (Teflon-coated radiofrequency ablation needles)  Gauge (G): 22  Length: Regular (10cm)  Qty: 4      H&P (Pre-op Assessment):  Suzanne Spears is a 62 y.o. (year old), female patient, seen today for interventional treatment. She  has a past surgical history that includes Cesarean section; Appendectomy (2009); turbinectomy (2009); ganglionic cyst (remote); tendon release surgery (Left); Colonoscopy; Tonsillectomy; Anterior cervical decomp/discectomy fusion (N/A, 10/21/2012); bi-ventricular implantable cardioverter defibrillator (N/A, 06/15/2014); blood clot removed from left top hand; Thoracotomy (Left, 11/30/2014); Epicardial pacing lead placement (N/A, 11/30/2014); Cardiac catheterization (01/13/05/2015); Cardiac catheterization; Esophagogastroduodenoscopy (egd) with propofol (N/A, 04/07/2017); Breast biopsy (Left, 12/17/2018); Breast biopsy (Left, 12/17/2018); Colonoscopy with propofol (N/A, 07/12/2021); and ICD GENERATOR CHANGEOUT (N/A, 10/24/2021). Suzanne Spears has a current medication list which includes the following prescription(s): albuterol,  albuterol, baclofen, cetirizine, cyanocobalamin, dapagliflozin propanediol, duloxetine, elderberry, entresto, ezetimibe, fluticasone, furosemide, gabapentin, lidocaine, multiple minerals-vitamins, rosuvastatin, spironolactone, syringe 3cc/25gx1", tramadol, trazodone, triamcinolone ointment, ubrelvy, valacyclovir, and venlafaxine xr, and the following  Facility-Administered Medications: fentanyl and lactated ringers. Her primarily concern today is the Neck Pain  Initial Vital Signs:  Pulse/HCG Rate: 84ECG Heart Rate: 81 Temp: (!) 97.3 F (36.3 C) Resp: 16 BP: 120/72 SpO2: 98 %  BMI: Estimated body mass index is 29.62 kg/m as calculated from the following:   Height as of this encounter: 5\' 5"  (1.651 m).   Weight as of this encounter: 178 lb (80.7 kg).  Risk Assessment: Allergies: Reviewed. She is allergic to adhesive [tape], carvedilol, metoprolol, penicillin g, lisinopril, other, prednisone, latex, and levofloxacin.  Allergy Precautions: None required Coagulopathies: Reviewed. None identified.  Blood-thinner therapy: None at this time Active Infection(s): Reviewed. None identified. Suzanne Spears is afebrile  Site Confirmation: Suzanne Spears was asked to confirm the procedure and laterality before marking the site Procedure checklist: Completed Consent: Before the procedure and under the influence of no sedative(s), amnesic(s), or anxiolytics, the patient was informed of the treatment options, risks and possible complications. To fulfill our ethical and legal obligations, as recommended by the American Medical Association's Code of Ethics, I have informed the patient of my clinical impression; the nature and purpose of the treatment or procedure; the risks, benefits, and possible complications of the intervention; the alternatives, including doing nothing; the risk(s) and benefit(s) of the alternative treatment(s) or procedure(s); and the risk(s) and benefit(s) of doing nothing. The patient was  provided information about the general risks and possible complications associated with the procedure. These may include, but are not limited to: failure to achieve desired goals, infection, bleeding, organ or nerve damage, allergic reactions, paralysis, and death. In addition, the patient was informed of those risks and complications associated to Spine-related procedures, such as failure to decrease pain; infection (i.e.: Meningitis, epidural or intraspinal abscess); bleeding (i.e.: epidural hematoma, subarachnoid hemorrhage, or any other type of intraspinal or peri-dural bleeding); organ or nerve damage (i.e.: Any type of peripheral nerve, nerve root, or spinal cord injury) with subsequent damage to sensory, motor, and/or autonomic systems, resulting in permanent pain, numbness, and/or weakness of one or several areas of the body; allergic reactions; (i.e.: anaphylactic reaction); and/or death. Furthermore, the patient was informed of those risks and complications associated with the medications. These include, but are not limited to: allergic reactions (i.e.: anaphylactic or anaphylactoid reaction(s)); adrenal axis suppression; blood sugar elevation that in diabetics may result in ketoacidosis or comma; water retention that in patients with history of congestive heart failure may result in shortness of breath, pulmonary edema, and decompensation with resultant heart failure; weight gain; swelling or edema; medication-induced neural toxicity; particulate matter embolism and blood vessel occlusion with resultant organ, and/or nervous system infarction; and/or aseptic necrosis of one or more joints. Finally, the patient was informed that Medicine is not an exact science; therefore, there is also the possibility of unforeseen or unpredictable risks and/or possible complications that may result in a catastrophic outcome. The patient indicated having understood very clearly. We have given the patient no guarantees  and we have made no promises. Enough time was given to the patient to ask questions, all of which were answered to the patient's satisfaction. Suzanne Spears has indicated that she wanted to continue with the procedure. Attestation: I, the ordering provider, attest that I have discussed with the patient the benefits, risks, side-effects, alternatives, likelihood of achieving goals, and potential problems during recovery for the procedure that I have provided informed consent. Date  Time: 02/02/2023  8:22 AM   Pre-Procedure Preparation:  Monitoring: As per clinic protocol.  Respiration, ETCO2, SpO2, BP, heart rate and rhythm monitor placed and checked for adequate function Safety Precautions: Patient was assessed for positional comfort and pressure points before starting the procedure. Time-out: I initiated and conducted the "Time-out" before starting the procedure, as per protocol. The patient was asked to participate by confirming the accuracy of the "Time Out" information. Verification of the correct person, site, and procedure were performed and confirmed by me, the nursing staff, and the patient. "Time-out" conducted as per Joint Commission's Universal Protocol (UP.01.01.01). Time: 0900 Start Time: 0900 hrs.  Description/Narrative of Procedure:          Start Time: 0900 hrs. Rationale (medical necessity): procedure needed and proper for the diagnosis and/or treatment of the patient's medical symptoms and needs. Procedural Technique Safety Precautions: Aspiration looking for blood return was conducted prior to all injections. At no point did we inject any substances, as a needle was being advanced. No attempts were made at seeking any paresthesias. Safe injection practices and needle disposal techniques used. Medications properly checked for expiration dates. SDV (single dose vial) medications used. Description of the Procedure: Protocol guidelines were followed. The patient was assisted into a  comfortable position. The target area was identified and the area prepped in the usual manner. Skin & deeper tissues infiltrated with local anesthetic. Appropriate amount of time allowed to pass for local anesthetics to take effect. The procedure needles were then advanced to the target area. Proper needle placement secured. Negative aspiration confirmed. Solution injected in intermittent fashion, asking for systemic symptoms every 0.5cc of injectate. The needles were then removed and the area cleansed, making sure to leave some of the prepping solution back to take advantage of its long term bactericidal properties.  Technical description of procedure:  Laterality: See above. Radiofrequency Ablation (RFA) C4 Medial Branch Nerve RFA: The target area for the C4 dorsal medial articular branch is the lateral concave waist of the articular pillar of C4. Under fluoroscopic guidance, a Radiofrequency needle was inserted until contact was made with os over the postero-lateral aspect of the articular pillar of C4 (target area). Sensory and motor testing was conducted to properly adjust the position of the needle. Once satisfactory placement of the needle was achieved, the numbing solution was slowly injected after negative aspiration for blood. 2.0 mL of the nerve block solution was injected without difficulty or complication. After waiting for at least 3 minutes, the ablation was performed. Once completed, the needle was removed intact. C5 Medial Branch Nerve RFA: The target area for the C5 dorsal medial articular branch is the lateral concave waist of the articular pillar of C5. Under fluoroscopic guidance, a Radiofrequency needle was inserted until contact was made with os over the postero-lateral aspect of the articular pillar of C5 (target area). Sensory and motor testing was conducted to properly adjust the position of the needle. Once satisfactory placement of the needle was achieved, the numbing solution was  slowly injected after negative aspiration for blood. 2.0 mL of the nerve block solution was injected without difficulty or complication. After waiting for at least 3 minutes, the ablation was performed. Once completed, the needle was removed intact. C6 Medial Branch Nerve RFA: The target area for the C6 dorsal medial articular branch is the lateral concave waist of the articular pillar of C6. Under fluoroscopic guidance, a Radiofrequency needle was inserted until contact was made with os over the postero-lateral aspect of the articular pillar of C6 (target area). Sensory and motor testing was conducted to  properly adjust the position of the needle. Once satisfactory placement of the needle was achieved, the numbing solution was slowly injected after negative aspiration for blood. 2.0 mL of the nerve block solution was injected without difficulty or complication. After waiting for at least 3 minutes, the ablation was performed. Once completed, the needle was removed intact. C7 Medial Branch Nerve RFA: The target for the C7 dorsal medial articular branch lies on the superior-medial tip of the C7 transverse process. Under fluoroscopic guidance, a Radiofrequency needle was inserted until contact was made with os over the postero-lateral aspect of the articular pillar of C7 (target area). Sensory and motor testing was conducted to properly adjust the position of the needle. Once satisfactory placement of the needle was achieved, the numbing solution was slowly injected after negative aspiration for blood. 2.0 mL of the nerve block solution was injected without difficulty or complication. After waiting for at least 3 minutes, the ablation was performed. Once completed, the needle was removed intact. Radiofrequency lesioning (ablation):  Radiofrequency Generator: Medtronic AccurianTM AG 1000 RF Generator Sensory Stimulation Parameters: 50 Hz was used to locate & identify the nerve, making sure that the needle was  positioned such that there was no sensory stimulation below 0.3 V or above 0.7 V. Motor Stimulation Parameters: 2 Hz was used to evaluate the motor component. Care was taken not to lesion any nerves that demonstrated motor stimulation of the lower extremities at an output of less than 2.5 times that of the sensory threshold, or a maximum of 2.0 V. Lesioning Technique Parameters: Standard Radiofrequency settings. (Not bipolar or pulsed.) Temperature Settings: 80 degrees C Lesioning time: 60 seconds Stationary intra-operative compliance: Compliant   Once the entire procedure was completed, the treated area was cleaned, making sure to leave some of the prepping solution back to take advantage of its long term bactericidal properties. Stationary intra-operative compliance: Compliant  Anatomy Reference Guide:         Facet Joint Innervation  C1-2 Third occipital Nerve (TON)  C2-3 TON, C3  Medial Branch  C3-4 C3, C4         "          "  C4-5 C4, C5         "          "  C5-6 C5, C6         "          "  C6-7 C6, C7         "          "  C7-T1 C7, C8         "          "   Cervical Facet Pain Pattern overlap:   Vitals:   02/02/23 0927 02/02/23 0937 02/02/23 0947 02/02/23 0957  BP: 129/83 (!) 119/57 (!) 113/47 112/69  Pulse:      Resp: 16 18 20 14   Temp:  98.1 F (36.7 C)  (!) 97.4 F (36.3 C)  TempSrc:  Temporal  Temporal  SpO2: 93% 95% 98% 100%  Weight:      Height:         End Time: 0926 hrs.  Imaging Guidance (Spinal):          Type of Imaging Technique: Fluoroscopy Guidance (Spinal) Indication(s): Fluoroscopy guidance for needle placement to enhance accuracy in procedures requiring precise needle localization for targeted delivery of medication in or near specific anatomical locations not easily  accessible without such real-time imaging assistance. Exposure Time: Please see nurses notes. Contrast: None used. Fluoroscopic Guidance: I was personally present during the use  of fluoroscopy. "Tunnel Vision Technique" used to obtain the best possible view of the target area. Parallax error corrected before commencing the procedure. "Direction-depth-direction" technique used to introduce the needle under continuous pulsed fluoroscopy. Once target was reached, antero-posterior, oblique, and lateral fluoroscopic projection used confirm needle placement in all planes. Images permanently stored in EMR. Interpretation: No contrast injected. I personally interpreted the imaging intraoperatively. Adequate needle placement confirmed in multiple planes. Permanent images saved into the patient's record.  Post-operative Assessment:  Post-procedure Vital Signs:  Pulse/HCG Rate: 8478 Temp: (!) 97.4 F (36.3 C) Resp: 14 BP: 112/69 SpO2: 100 %  EBL: None  Complications: No immediate post-treatment complications observed by team, or reported by patient.  Note: The patient tolerated the entire procedure well. A repeat set of vitals were taken after the procedure and the patient was kept under observation following institutional policy, for this type of procedure. Post-procedural neurological assessment was performed, showing return to baseline, prior to discharge. The patient was provided with post-procedure discharge instructions, including a section on how to identify potential problems. Should any problems arise concerning this procedure, the patient was given instructions to immediately contact us, at any time, without hesitation. In any case, we plan to contact the patient by telephone for a follow-up status report regarding this interventional procedure.  Comments:  No additional relevant information.  Plan of Care (POC)  Orders:  Orders Placed This Encounter  Procedures   DG PAIN [facility] C-ARM 1-60 MIN NO REPORT    Intraoperative interpretation by procedural physician at [facility].    Standing Status:   Standing    Number of Occurrences:   1    Order Specific  Question:   Reason for exam:    Answer:   Assistance in needle guidance and placement for procedures requiring needle placement in or near specific anatomical locations not easily accessible without such assistance.     Medications ordered for procedure: Meds ordered this encounter  Medications   lidocaine (XYLOCAINE) 2 % (with pres) injection 400 mg   lactated ringers infusion   midazolam (VERSED) injection 0.5-2 mg    Make sure Flumazenil is available in the pyxis when using this medication. If oversedation occurs, administer 0.2 mg IV over 15 sec. If after 45 sec no response, administer 0.2 mg again over 1 min; may repeat at 1 min intervals; not to exceed 4 doses (1 mg)   ropivacaine (PF) 2 mg/mL (0.2%) (NAROPIN) injection 9 mL   dexamethasone (DECADRON) injection 20 mg   fentaNYL (SUBLIMAZE) injection 25-50 mcg    Make sure Narcan is available in the pyxis when using this medication. In the event of respiratory depression (RR< 8/min): Titrate NARCAN (naloxone) in increments of 0.1 to 0.2 mg IV at 2-3 minute intervals, until desired degree of reversal.   Medications administered: We administered lidocaine, lactated ringers, midazolam, ropivacaine (PF) 2 mg/mL (0.2%), dexamethasone, and fentaNYL.  See the medical record for exact dosing, route, and time of administration.  Follow-up plan:   Return in about 6 weeks (around 03/16/2023) for PPE F2F.       Right TON, C3, C4 MBNB 08/20/22, Right C4,5,6,7, MBNB 10/06/22, #2 12/10/22, Right C4,5,6, 7, RFA 02/02/23    Recent Visits Date Type Provider Dept  01/14/23 Office Visit Edward Jolly, MD [facility]  12/10/22 Procedure visit Edward Jolly,  MD [facility]  Showing recent visits within past 90 days and meeting all other requirements Today's Visits Date Type Provider Dept  02/02/23 Procedure visit Edward Jolly, MD [facility]  Showing today's visits and meeting all other requirements Future  Appointments Date Type Provider Dept  03/16/23 Appointment Edward Jolly, MD [facility]  Showing future appointments within next 90 days and meeting all other requirements  Disposition: Discharge home  Discharge (Date  Time): 02/02/2023; 1005 hrs.   Primary Care Physician: Sherlene Shams, MD Location: [facility] Note by: Edward Jolly, MD (TTS technology used. I apologize for any typographical errors that were not detected and corrected.) Date: 02/02/2023; Time: 10:01 AM  Disclaimer:  Medicine is not an Visual merchandiser. The only guarantee in medicine is that nothing is guaranteed. It is important to note that the decision to proceed with this intervention was based on the information collected from the patient. The Data and conclusions were drawn from the patient's questionnaire, the interview, and the physical examination. Because the information was provided in large part by the patient, it cannot be guaranteed that it has not been purposely or unconsciously manipulated. Every effort has been made to obtain as much relevant data as possible for this evaluation. It is important to note that the conclusions that lead to this procedure are derived in large part from the available data. Always take into account that the treatment will also be dependent on availability of resources and existing treatment guidelines, considered by other Pain Management Practitioners as being common knowledge and practice, at the time of the intervention. For Medico-Legal purposes, it is also important to point out that variation in procedural techniques and pharmacological choices are the acceptable norm. The indications, contraindications, technique, and results of the above procedure should only be interpreted and judged by a Board-Certified Interventional Pain Specialist with extensive familiarity and expertise in the same exact procedure and technique.

## 2023-02-02 NOTE — Patient Instructions (Signed)

## 2023-02-03 ENCOUNTER — Telehealth: Payer: Self-pay | Admitting: Internal Medicine

## 2023-02-03 ENCOUNTER — Telehealth: Payer: Self-pay | Admitting: *Deleted

## 2023-02-03 DIAGNOSIS — I5032 Chronic diastolic (congestive) heart failure: Secondary | ICD-10-CM

## 2023-02-03 NOTE — Telephone Encounter (Signed)
No problems post procedure. 

## 2023-02-03 NOTE — Telephone Encounter (Signed)
*  STAT* If patient is at the pharmacy, call can be transferred to refill team.   1. Which medications need to be refilled? (please list name of each medication and dose if known)    spironolactone (ALDACTONE) 25 MG tablet    furosemide (LASIX) 20 MG tablet    ezetimibe (ZETIA) 10 MG tablet    dapagliflozin propanediol (FARXIGA) 10 MG TABS tablet      4. Which pharmacy/location (including street and city if local pharmacy) is medication to be sent to?   [facility] - [place], [place] - [address] Phone: [phone]  Fax: [phone]      5. Do they need a 30 day or 90 day supply? 90

## 2023-02-03 NOTE — Telephone Encounter (Signed)
The [employer] forms pulled from Dr. Odessa Fleming box and placed on Desma Maxim, RN's desk to review in Dr. Odessa Fleming nurses absence.

## 2023-02-04 MED ORDER — EZETIMIBE 10 MG PO TABS: 10.0000 mg | ORAL_TABLET | Freq: Every day | ORAL | 2 refills | Status: DC

## 2023-02-04 MED ORDER — SPIRONOLACTONE 25 MG PO TABS: 25.0000 mg | ORAL_TABLET | Freq: Every day | ORAL | 2 refills | Status: DC

## 2023-02-04 MED ORDER — FUROSEMIDE 20 MG PO TABS: 20.0000 mg | ORAL_TABLET | ORAL | 1 refills | Status: DC

## 2023-02-04 MED ORDER — DAPAGLIFLOZIN PROPANEDIOL 10 MG PO TABS: ORAL_TABLET | ORAL | 2 refills | Status: DC

## 2023-02-04 NOTE — Telephone Encounter (Signed)
Requested Prescriptions   Signed Prescriptions Disp Refills   dapagliflozin propanediol (FARXIGA) 10 MG TABS tablet 90 tablet 2    Sig: TAKE 1 TABLET(10 MG) BY MOUTH DAILY    Authorizing Provider: Duke Salvia    Ordering User: Feliberto Harts L   ezetimibe (ZETIA) 10 MG tablet 90 tablet 2    Sig: Take 1 tablet (10 mg total) by mouth daily.    Authorizing Provider: Duke Salvia    Ordering User: Guerry Minors   furosemide (LASIX) 20 MG tablet 180 tablet 1    Sig: Take 1 tablet (20 mg total) by mouth as directed. Take 1 tablet daily. Take extra 20 mg as needed.    Authorizing Provider: Duke Salvia    Ordering User: Guerry Minors   spironolactone (ALDACTONE) 25 MG tablet 90 tablet 2    Sig: Take 1 tablet (25 mg total) by mouth daily.    Authorizing Provider: Duke Salvia    Ordering User: Guerry Minors   Last visit with CB on 07/25/22 with plan to Follow-up with Dr. Graciela Husbands in 6 months  last visit: 01/29/23 with plan to Follow-up in 12 months with Dr Graciela Husbands  Next visit: none/Active recall

## 2023-02-05 NOTE — Telephone Encounter (Signed)
See other telephone encounter. Mindi Junker is aware of forms and will review with Dr. Graciela Husbands on Monday 12/16. Closing this encounter.

## 2023-02-09 ENCOUNTER — Telehealth: Payer: No Typology Code available for payment source | Admitting: Nurse Practitioner

## 2023-02-09 VITALS — Ht 65.0 in | Wt 177.0 lb

## 2023-02-09 DIAGNOSIS — J0110 Acute frontal sinusitis, unspecified: Secondary | ICD-10-CM

## 2023-02-09 MED ORDER — DOXYCYCLINE HYCLATE 100 MG PO TABS: 100.0000 mg | ORAL_TABLET | Freq: Two times a day (BID) | ORAL | 0 refills | Status: DC

## 2023-02-09 MED ORDER — HYDROCOD POLI-CHLORPHE POLI ER 10-8 MG/5ML PO SUER: 5.0000 mL | Freq: Every evening | ORAL | 0 refills | Status: DC | PRN

## 2023-02-09 NOTE — Assessment & Plan Note (Addendum)
They exhibit severe cough for 5-6 days accompanied by postnasal drip, frontal headache, and facial pain, without fever, body aches, or changes in taste/smell. Despite no improvement with Benadryl and Robitussin, we will start Doxycycline twice daily for 7 days. They should continue using nasal spray and Allegra and maintain adequate hydration. Their severe cough has led to chest wall pain, likely from muscle strain or inflammation due to coughing. We will manage this with Tussionex at bedtime to reduce the cough's severity and frequency, as she does not tolerate oral steroids. Counseled on common side effects. An in-person visit may be considered for a potential steroid shot if there's no improvement. Return precautions given to patient.

## 2023-02-09 NOTE — Telephone Encounter (Signed)
Forms given to [facility] for further completion and signature from provider.
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
## 2023-02-16 ENCOUNTER — Ambulatory Visit: Payer: No Typology Code available for payment source | Attending: Internal Medicine

## 2023-02-16 DIAGNOSIS — Z95810 Presence of automatic (implantable) cardiac defibrillator: Secondary | ICD-10-CM | POA: Diagnosis not present

## 2023-02-16 DIAGNOSIS — I5032 Chronic diastolic (congestive) heart failure: Secondary | ICD-10-CM

## 2023-02-19 ENCOUNTER — Telehealth: Payer: Self-pay

## 2023-02-19 NOTE — Progress Notes (Signed)
EPIC Encounter for ICM Monitoring  Patient Name: Suzanne Spears is a 62 y.o. female Date: 02/19/2023 Primary Care Physican: Sherlene Shams, MD Primary Cardiologist: Mariah Milling Electrophysiologist: Joycelyn Schmid Pacing:  98.1%    06/04/2022 Weight: 159 lbs 07/25/2022 Office Weight: 165 lbs 10/29/2022 Weight: 170 lbs            Attempted call to patient and unable to reach.  Left detailed message per DPR regarding transmission.  Transmission results reviewed.    Diet: Does not adhere to low salt diet   OptiVol Thoracic impedance suggesting normal fluid levels with the exception of possible fluid accumulation from 12/7-12/14.      Prescribed:  Furosemide 20 mg Take1 tablet (20 mg) by mouth as directed. Take 1 tablet daily. May take extra 20 mg as needed for weight gain or swelling.   Spironolactone 25 mg take 1 tablet daily   Labs: 12/08/2022 Creatinine 1.20, BUN 22, Potassium 3.6, Sodium 138, GFR 52 11/14/2022 Creatinine 1.32, BUN 28, Potassium 3.7, Sodium 139, GFR 46 08/14/2022 Creatinine 1.36, BUN 26, Potassium 4.2, Sodium 138, GFR 44  07/25/2022 Creatinine 1.25, BUN 20, Potassium 4.7, Sodium 138, GFR 49  A complete set of results can be found in Results Review.   Recommendations:  Left voice mail with ICM number and encouraged to call if experiencing any fluid symptoms.   Follow-up plan: ICM clinic phone appointment on 03/23/2023.  91 day device clinic remote transmission 04/28/2023.     EP/Cardiology Office Visits:  Recall 01/29/2024 with Dr Graciela Husbands.   Copy of ICM check sent to Dr. Graciela Husbands.    3 month ICM trend: 02/16/2023.    12-14 Month ICM trend:     Karie Soda, RN 02/19/2023 8:48 AM

## 2023-02-19 NOTE — Telephone Encounter (Signed)
Remote ICM transmission received.  Attempted call to patient regarding ICM remote transmission and left detailed message per DPR.  Left ICM phone number and advised to return call for any fluid symptoms or questions. Next ICM remote transmission scheduled 03/23/2023.

## 2023-02-20 NOTE — Telephone Encounter (Signed)
Suzanne Spears reached out to pt via MyChart on 12/19 to clarify pt's job description.  According to MyChart pt has not yet read this message. Spoke with pt and advised information sent through MyChart message regarding job description.  Pt states she recently had hand surgery but will respond back to message so forms may be completed.  Pt thanked Charity fundraiser for the call.

## 2023-02-23 NOTE — Telephone Encounter (Signed)
Forms given to Dr. Graciela Husbands to review and advised of 02/12/23 Patient Advise Request message as well.

## 2023-03-04 ENCOUNTER — Encounter: Payer: Self-pay | Admitting: Internal Medicine

## 2023-03-04 ENCOUNTER — Telehealth: Payer: Self-pay | Admitting: Internal Medicine

## 2023-03-04 NOTE — Telephone Encounter (Signed)
 Pt is wanting to know if there is something cheaper than Effexor she can take.

## 2023-03-04 NOTE — Telephone Encounter (Signed)
 Pt called and pt is requesting assistance with med prices  Pt assistance pamphlets mailed to pt and MyChart messaged for web links for assistance.    Message to [facility] for pt assistance

## 2023-03-04 NOTE — Telephone Encounter (Signed)
 Pt c/o medication issue:  1. Name of Medication:   dapagliflozin  propanediol (FARXIGA ) 10 MG TABS tablet    ENTRESTO  24-26 MG    2. How are you currently taking this medication (dosage and times per day)? As written   3. Are you having a reaction (difficulty breathing--STAT)? No   4. What is your medication issue? Pt called in asking if there are coupons for these two medications or generic medications that can get that would be cheaper.

## 2023-03-05 NOTE — Telephone Encounter (Signed)
 Spoke with patient and reviewed that since she has nurse, learning disability she would qualify for the $0 copay card. Reviewed that she can go online and type in Farxiga  savings card. Entresto  application started and pending her signature and income documentation. She verbalized understanding with no further questions at this time.

## 2023-03-06 ENCOUNTER — Encounter: Payer: Self-pay | Admitting: Internal Medicine

## 2023-03-09 NOTE — Telephone Encounter (Signed)
 Faxed forms & called patient ready for pick up, paperwork at front desk

## 2023-03-13 ENCOUNTER — Encounter: Payer: Self-pay | Admitting: Internal Medicine

## 2023-03-13 NOTE — Telephone Encounter (Signed)
Noted. Pt responded with another Radio broadcast assistant

## 2023-03-16 ENCOUNTER — Ambulatory Visit
Payer: No Typology Code available for payment source | Attending: Student in an Organized Health Care Education/Training Program | Admitting: Student in an Organized Health Care Education/Training Program

## 2023-03-16 ENCOUNTER — Encounter: Payer: Self-pay | Admitting: Student in an Organized Health Care Education/Training Program

## 2023-03-16 DIAGNOSIS — M47812 Spondylosis without myelopathy or radiculopathy, cervical region: Secondary | ICD-10-CM

## 2023-03-16 DIAGNOSIS — G894 Chronic pain syndrome: Secondary | ICD-10-CM | POA: Diagnosis not present

## 2023-03-16 NOTE — Progress Notes (Signed)
Patient: Suzanne Spears  Service Category: E/M  Provider: Edward Jolly, MD  DOB: Suzanne [birth year]  DOS: 03/16/2023  Location: Office  MRN: [id number]  Setting: Ambulatory outpatient  Referring Provider: Sherlene Shams, MD  Type: Established Patient  Specialty: Interventional Pain Management  PCP: Sherlene Shams, MD  Location: Remote location  Delivery: TeleHealth     Virtual Encounter - Pain Management PROVIDER NOTE: Information contained herein reflects review and annotations entered in association with encounter. Interpretation of such information and data should be left to medically-trained personnel. Information provided to patient can be located elsewhere in the medical record under "Patient Instructions". Document created using STT-dictation technology, any transcriptional errors that may result from process are unintentional.    Contact & Pharmacy Preferred: [phone] Home: [phone] (home) Mobile: [phone] (mobile) E-mail: [email or web address]  [facility] - [place], [place] - [address] [place] [address] Phone: [phone] Fax: [phone]   Pre-screening  Suzanne Spears offered "in-person" vs "virtual" encounter. She indicated preferring virtual for this encounter.   Reason COVID-19*  Social distancing based on CDC and AMA recommendations.   I contacted Suzanne Spears on 03/16/2023 via telephone.      I clearly identified myself as Edward Jolly, MD. I verified that I was speaking with the correct person using two identifiers (Name: Suzanne Spears, and date of birth: Suzanne Spears, Suzanne Spears).  Consent I sought verbal advanced consent from Suzanne Spears for virtual visit interactions. I informed Suzanne Spears of possible security and privacy concerns, risks, and limitations associated with providing "not-in-person" medical evaluation and management services. I also informed Suzanne Spears of the availability of "in-person" appointments. Finally, I informed her that  there would be a charge for the virtual visit and that she could be  personally, fully or partially, financially responsible for it. Suzanne Spears expressed understanding and agreed to proceed.   Historic Elements   Suzanne Spears is a 63 y.o. year old, female patient evaluated today after our last contact on 02/02/2023. Suzanne Spears  has a past medical history of AICD (automatic cardioverter/defibrillator) present, Allergy, Anemia, Asthma, Breast calcifications on mammogram (04/Spears/2021), Cardiomyopathy, dilated, nonischemic (HCC), CKD (chronic kidney disease), stage III (HCC), Cough, Depression, Dizziness, GERD (gastroesophageal reflux disease), Heart failure with mid range ejection fraction (HFmrEF) (HCC), HFrEF (heart failure with reduced ejection fraction) (HCC), Hip pain, right (03/1998), History of 2019 novel coronavirus disease (COVID-19) (03/11/2019), History of bronchitis, History of shingles, Hyperlipidemia, Hypertension, Left bundle branch block (2008), Migraine, Mitral valve prolapse syndrome, Pericarditis (2008), Peripheral neuropathy, Pneumonia (2016), Presence of permanent cardiac pacemaker, and Spinal headache. She also  has a past surgical history that includes Cesarean section; Appendectomy (2009); turbinectomy (2009); ganglionic cyst (remote); tendon release surgery (Left); Colonoscopy; Tonsillectomy; Anterior cervical decomp/discectomy fusion (N/A, 10/21/2012); bi-ventricular implantable cardioverter defibrillator (N/A, 06/15/2014); blood clot removed from left top hand; Thoracotomy (Left, 11/30/2014); Epicardial pacing lead placement (N/A, 11/30/2014); Cardiac catheterization (01/13/05/2015); Cardiac catheterization; Esophagogastroduodenoscopy (egd) with propofol (N/A, 04/07/2017); Breast biopsy (Left, 12/17/2018); Breast biopsy (Left, 12/17/2018); Colonoscopy with propofol (N/A, 07/12/2021); and ICD GENERATOR CHANGEOUT (N/A, 10/24/2021). Suzanne Spears has a current medication list which includes the  following prescription(s): albuterol, albuterol, baclofen, cetirizine, chlorpheniramine-hydrocodone, cyanocobalamin, dapagliflozin propanediol, doxycycline, duloxetine, elderberry, entresto, ezetimibe, fluticasone, furosemide, gabapentin, lidocaine, multiple minerals-vitamins, rosuvastatin, spironolactone, syringe 3cc/25gx1", tramadol, trazodone, triamcinolone ointment, ubrelvy, valacyclovir, and venlafaxine xr. She  reports that she has never smoked. She has never used smokeless tobacco. She reports current alcohol use.  She reports that she does not use drugs. Suzanne Spears is allergic to adhesive [tape], carvedilol, metoprolol, penicillin g, lisinopril, other, prednisone, latex, and levofloxacin.  BMI: Estimated body mass index is 29.45 kg/m as calculated from the following:   Height as of 02/09/23: 5\' 5"  (1.651 m).   Weight as of 02/09/23: 177 lb (80.3 kg). Last encounter: 01/14/2023. Last procedure: 02/02/2023.  HPI  Today, she is being contacted for a post-procedure assessment.   Post-procedure evaluation    Procedure: Cervical Facet, Medial Branch Radiofrequency Ablation (RFA)  #1  Laterality: Right (-RT)  Level: C4, C5, C6, and C7 Medial Branch Level(s). Lesioning of these levels should completely denervate the C4-5, C5-6, and C6-7 cervical facet joints.  Imaging: Fluoroscopy-guided         Anesthesia: Local anesthesia (1-2% Lidocaine) Sedation: Moderate Sedation                       DOS: 02/02/2023  Performed by: Edward Jolly, MD  Purpose: Therapeutic/Palliative Indications: Chronic neck pain (cervicalgia) severe enough to impact quality of life or function. Rationale (medical necessity): procedure needed and proper for the diagnosis and/or treatment of Suzanne Spears medical symptoms and needs. Indications: 1. Cervical facet joint syndrome   2. Cervical spondylosis   3. Chronic pain syndrome    Suzanne Spears has been dealing with the above chronic pain for longer than three months and  has either failed to respond, was unable to tolerate, or simply did not get enough benefit from other more conservative therapies including, but not limited to: 1. Over-the-counter medications 2. Anti-inflammatory medications 3. Muscle relaxants 4. Membrane stabilizers 5. Opioids 6. Physical therapy and/or chiropractic manipulation 7. Modalities (Heat, ice, etc.) 8. Invasive techniques such as nerve blocks. Suzanne Spears has attained more than 50% relief of the pain from a series of diagnostic injections conducted in separate occasions.  Pain Score: Pre-procedure: 5 /10 Post-procedure: 0-No pain/10    Effectiveness:  Initial hour after procedure: 100 %  Subsequent 4-6 hours post-procedure: 100 %  Analgesia past initial 6 hours: 100 %  Ongoing improvement:  Analgesic:  100% Function: Suzanne Spears reports improvement in function ROM: Suzanne Spears reports improvement in ROM   Laboratory Chemistry Profile   Renal Lab Results  Component Value Date   BUN 22 12/08/2022   CREATININE 1.20 (H) 12/08/2022   BCR 13 07/17/2020   GFR 44.94 (L) 10/28/2018   GFRAA 54 (L) 11/04/2019   GFRNONAA 52 (L) 12/08/2022    Hepatic Lab Results  Component Value Date   AST 24 11/18/2022   ALT 19 11/18/2022   ALBUMIN 4.5 11/18/2022   ALKPHOS 115 11/18/2022   HCVAB NEGATIVE 08/17/2015   LIPASE 41 11/14/2022    Electrolytes Lab Results  Component Value Date   NA 138 12/08/2022   K 3.6 12/08/2022   CL 102 12/08/2022   CALCIUM 8.9 12/08/2022   MG 1.8 05/02/2020    Bone Lab Results  Component Value Date   VD25OH 43.92 12/08/2013    Inflammation (CRP: Acute Phase) (ESR: Chronic Phase) Lab Results  Component Value Date   CRP <1.0 06/05/2022   ESRSEDRATE 18 06/05/2022         Note: Above Lab results reviewed.   Assessment  The primary encounter diagnosis was Cervical facet joint syndrome. Diagnoses of Cervical spondylosis and Chronic pain syndrome were also pertinent to this visit.  Plan of  Care  Excellent relief after right cervical RFA.  Endorses 100% pain  relief and the burning sensation in the right side of her neck.  Continue to monitor symptoms, follow-up as needed.  Follow-up plan:   Return if symptoms worsen or fail to improve.      Right TON, C3, C4 MBNB 08/20/22, Right C4,5,6,7, MBNB 10/06/22, #2 12/10/22, Right C4,5,6, 7, RFA 02/02/23    Recent Visits Date Type Provider Dept  02/02/23 Procedure visit Edward Jolly, MD [facility]  01/14/23 Office Visit Edward Jolly, MD [facility]  Showing recent visits within past 90 days and meeting all other requirements Today's Visits Date Type Provider Dept  03/16/23 Office Visit Edward Jolly, MD [facility]  Showing today's visits and meeting all other requirements Future Appointments No visits were found meeting these conditions. Showing future appointments within next 90 days and meeting all other requirements  I discussed the assessment and treatment plan with the patient. The patient was provided an opportunity to ask questions and all were answered. The patient agreed with the plan and demonstrated an understanding of the instructions.  Patient advised to call back or seek an in-person evaluation if the symptoms or condition worsens.  Duration of encounter: .  Note by: Edward Jolly, MD Date: 03/16/2023; Time: 1:40 PM

## 2023-03-23 ENCOUNTER — Ambulatory Visit: Payer: No Typology Code available for payment source | Attending: Internal Medicine

## 2023-03-23 DIAGNOSIS — I5032 Chronic diastolic (congestive) heart failure: Secondary | ICD-10-CM | POA: Diagnosis not present

## 2023-03-23 DIAGNOSIS — Z95810 Presence of automatic (implantable) cardiac defibrillator: Secondary | ICD-10-CM

## 2023-03-27 NOTE — Progress Notes (Signed)
EPIC Encounter for ICM Monitoring  Patient Name: Suzanne Spears is a 63 y.o. female Date: 03/27/2023 Primary Care Physican: Sherlene Shams, MD Primary Cardiologist: Mariah Milling Electrophysiologist: Joycelyn Schmid Pacing:  97.5%    06/04/2022 Weight: 159 lbs 07/25/2022 Office Weight: 165 lbs 10/29/2022 Weight: 170 lbs 03/27/2023 Weight: 174 lbs            Spoke with patient and heart failure questions reviewed.  Transmission results reviewed.  Pt asymptomatic for fluid accumulation.  Reports feeling well at this time and voices no complaints.  She takes extra Lasix when needed.     Diet: Does not adhere to low salt diet   OptiVol Thoracic impedance suggesting normal fluid levels with the exception of possible fluid accumulation from 12/28-1/7.      Prescribed:  Furosemide 20 mg Take1 tablet (20 mg) by mouth as directed. Take 1 tablet daily. May take extra 20 mg as needed for weight gain or swelling.   Spironolactone 25 mg take 1 tablet daily   Labs: 12/08/2022 Creatinine 1.20, BUN 22, Potassium 3.6, Sodium 138, GFR 52 11/14/2022 Creatinine 1.32, BUN 28, Potassium 3.7, Sodium 139, GFR 46 08/14/2022 Creatinine 1.36, BUN 26, Potassium 4.2, Sodium 138, GFR 44  07/25/2022 Creatinine 1.25, BUN 20, Potassium 4.7, Sodium 138, GFR 49  A complete set of results can be found in Results Review.   Recommendations:  No changes and encouraged to call if experiencing any fluid symptoms.   Follow-up plan: ICM clinic phone appointment on 04/27/2023.  91 day device clinic remote transmission 04/28/2023.     EP/Cardiology Office Visits:  Recall 01/29/2024 with Dr Graciela Husbands.   Copy of ICM check sent to Dr. Graciela Husbands.    3 month ICM trend: 03/23/2023.    12-14 Month ICM trend:     Karie Soda, RN 03/27/2023 9:16 AM

## 2023-04-06 ENCOUNTER — Telehealth: Payer: Self-pay | Admitting: Nurse Practitioner

## 2023-04-06 ENCOUNTER — Encounter: Payer: Self-pay | Admitting: Internal Medicine

## 2023-04-06 NOTE — Telephone Encounter (Signed)
 Returned the call to the patient. She was calling to say she can no longer afford the Entresto  and Farxiga . She stated that with the Farxiga  copay card, she was told it would only pay $175 of the $400 price.   She stated that the Entresto  financial application had been started but she has not has an update.

## 2023-04-06 NOTE — Telephone Encounter (Signed)
 Pt c/o medication issue:  1. Name of Medication:   ENTRESTO  24-26 MG   dapagliflozin  propanediol (FARXIGA ) 10 MG TABS tablet  2. How are you currently taking this medication (dosage and times per day)? As written   3. Are you having a reaction (difficulty breathing--STAT)? No   4. What is your medication issue? Pt called in stating she cannot afford this medication and she would like to be prescribed something else. Please advise.

## 2023-04-06 NOTE — Telephone Encounter (Signed)
 Error

## 2023-04-07 NOTE — Telephone Encounter (Signed)
Spoke with patient and reviewed our conversation back on 03/05/23 in regards to what was needed to process her application for assistance. Advised that we need proof of income and her signature. She reports no income and stated she will be by today to get those signed.

## 2023-04-07 NOTE — Telephone Encounter (Signed)
Patient came into office and dropped off necessary documentation needed for processing her patient assistance applications. [employer] assistance was completed and faxed to company. Suzanne Spears application completed and just waiting for provider to sign when he returns to office. No further needs at this time.

## 2023-04-07 NOTE — Telephone Encounter (Signed)
Called patient back to inquire if her husband is working. She confirmed and states that she will get a copy of their 1040 and bring that in and sign necessary documents. No further questions.

## 2023-04-09 NOTE — Telephone Encounter (Signed)
Applications completed, signed, and faxed.

## 2023-04-17 ENCOUNTER — Telehealth: Payer: Self-pay | Admitting: *Deleted

## 2023-04-17 NOTE — Telephone Encounter (Signed)
Insurance information requested by [employer]. Insurance card faxed to company.

## 2023-04-27 ENCOUNTER — Ambulatory Visit: Payer: No Typology Code available for payment source | Attending: Internal Medicine

## 2023-04-27 DIAGNOSIS — I5032 Chronic diastolic (congestive) heart failure: Secondary | ICD-10-CM | POA: Diagnosis not present

## 2023-04-27 DIAGNOSIS — Z95810 Presence of automatic (implantable) cardiac defibrillator: Secondary | ICD-10-CM

## 2023-04-28 ENCOUNTER — Ambulatory Visit (INDEPENDENT_AMBULATORY_CARE_PROVIDER_SITE_OTHER): Payer: Self-pay

## 2023-04-28 DIAGNOSIS — I5032 Chronic diastolic (congestive) heart failure: Secondary | ICD-10-CM

## 2023-04-28 DIAGNOSIS — I420 Dilated cardiomyopathy: Secondary | ICD-10-CM

## 2023-04-30 ENCOUNTER — Telehealth: Payer: Self-pay | Admitting: Internal Medicine

## 2023-04-30 LAB — CUP PACEART REMOTE DEVICE CHECK
Battery Remaining Longevity: 79 mo
Battery Voltage: 2.99 V
Brady Statistic AP VP Percent: 0.8 %
Brady Statistic AP VS Percent: 0.04 %
Brady Statistic AS VP Percent: 97.85 %
Brady Statistic AS VS Percent: 1.3 %
Brady Statistic RA Percent Paced: 0.85 %
Brady Statistic RV Percent Paced: 4.03 %
Date Time Interrogation Session: 20250304054346
HighPow Impedance: 91 Ohm
Implantable Lead Connection Status: 753985
Implantable Lead Connection Status: 753985
Implantable Lead Connection Status: 753985
Implantable Lead Connection Status: 753985
Implantable Lead Implant Date: 20160421
Implantable Lead Implant Date: 20160421
Implantable Lead Implant Date: 20161006
Implantable Lead Implant Date: 20161006
Implantable Lead Location: 753858
Implantable Lead Location: 753858
Implantable Lead Location: 753859
Implantable Lead Location: 753860
Implantable Lead Model: 5071
Implantable Lead Model: 5071
Implantable Lead Model: 5076
Implantable Pulse Generator Implant Date: 20230831
Lead Channel Impedance Value: 323 Ohm
Lead Channel Impedance Value: 380 Ohm
Lead Channel Impedance Value: 4047 Ohm
Lead Channel Impedance Value: 4047 Ohm
Lead Channel Impedance Value: 513 Ohm
Lead Channel Impedance Value: 665 Ohm
Lead Channel Pacing Threshold Amplitude: 0.5 V
Lead Channel Pacing Threshold Amplitude: 1 V
Lead Channel Pacing Threshold Pulse Width: 0.4 ms
Lead Channel Pacing Threshold Pulse Width: 0.6 ms
Lead Channel Sensing Intrinsic Amplitude: 19.5 mV
Lead Channel Sensing Intrinsic Amplitude: 19.5 mV
Lead Channel Sensing Intrinsic Amplitude: 2.875 mV
Lead Channel Sensing Intrinsic Amplitude: 2.875 mV
Lead Channel Setting Pacing Amplitude: 1 V
Lead Channel Setting Pacing Amplitude: 1.5 V
Lead Channel Setting Pacing Amplitude: 1.5 V
Lead Channel Setting Pacing Pulse Width: 0.4 ms
Lead Channel Setting Pacing Pulse Width: 0.6 ms
Lead Channel Setting Sensing Sensitivity: 0.3 mV
Zone Setting Status: 755011
Zone Setting Status: 755011

## 2023-04-30 NOTE — Telephone Encounter (Signed)
 Pt c/o medication issue:  1. Name of Medication:   dapagliflozin propanediol (FARXIGA) 10 MG TABS tablet   2. How are you currently taking this medication (dosage and times per day)?   Not taking now  3. Are you having a reaction (difficulty breathing--STAT)?   4. What is your medication issue?   Patient called to follow-up on the status of her request for assistance with getting this medication.  Patient stated she has now run out of this medication.

## 2023-05-01 NOTE — Telephone Encounter (Signed)
 Routed to med assistance team.  Thanks!

## 2023-05-01 NOTE — Progress Notes (Signed)
 EPIC Encounter for ICM Monitoring  Patient Name: Suzanne Spears is a 63 y.o. female Date: 05/01/2023 Primary Care Physican: Sherlene Shams, MD Primary Cardiologist: Mariah Milling Electrophysiologist: Joycelyn Schmid Pacing:  98.2%    06/04/2022 Weight: 159 lbs 07/25/2022 Office Weight: 165 lbs 10/29/2022 Weight: 170 lbs 03/27/2023 Weight: 174 lbs  Since 23-Mar-2023 VT-NS (>4 beats, >200 bpm)  2 Time in AT/AF <0.1 hr/day (<0.1%)            Spoke with patient and heart failure questions reviewed.  Transmission results reviewed.  Pt asymptomatic for fluid accumulation.  Reports feeling well at this time and voices no complaints.     Diet: Does not adhere to low salt diet   OptiVol Thoracic impedance suggesting normal fluid accumulation with the exception of possible fluid accumulation from 2/10-2/18 and 2/20-2/23.   Prescribed:  Furosemide 20 mg Take1 tablet (20 mg) by mouth as directed. Take 1 tablet daily. May take extra 20 mg as needed for weight gain or swelling.   Spironolactone 25 mg take 1 tablet daily   Labs: 12/08/2022 Creatinine 1.20, BUN 22, Potassium 3.6, Sodium 138, GFR 52 11/14/2022 Creatinine 1.32, BUN 28, Potassium 3.7, Sodium 139, GFR 46 08/14/2022 Creatinine 1.36, BUN 26, Potassium 4.2, Sodium 138, GFR 44  07/25/2022 Creatinine 1.25, BUN 20, Potassium 4.7, Sodium 138, GFR 49  A complete set of results can be found in Results Review.   Recommendations:  No changes and encouraged to call if experiencing any fluid symptoms.   Follow-up plan: ICM clinic phone appointment on 06/01/2023.  91 day device clinic remote transmission 07/28/2023.     EP/Cardiology Office Visits:  Recall 01/29/2024 with Dr Graciela Husbands.   Advised needs general cardiology appt - last visit with Ward Givens, NP was 07/25/2022 (should have followed up in 6 months)   Copy of ICM check sent to Dr. Graciela Husbands.    3 month ICM trend: 04/27/2023.    12-14 Month ICM trend:     Karie Soda, RN 05/01/2023 9:39 AM

## 2023-05-04 ENCOUNTER — Ambulatory Visit: Attending: Cardiovascular Disease | Admitting: Cardiovascular Disease

## 2023-05-04 ENCOUNTER — Telehealth: Payer: Self-pay | Admitting: Cardiovascular Disease

## 2023-05-04 VITALS — BP 100/60 | HR 83 | Ht 65.5 in | Wt 178.1 lb

## 2023-05-04 DIAGNOSIS — I255 Ischemic cardiomyopathy: Secondary | ICD-10-CM

## 2023-05-04 DIAGNOSIS — I5032 Chronic diastolic (congestive) heart failure: Secondary | ICD-10-CM | POA: Diagnosis not present

## 2023-05-04 DIAGNOSIS — I10 Essential (primary) hypertension: Secondary | ICD-10-CM

## 2023-05-04 DIAGNOSIS — Z95810 Presence of automatic (implantable) cardiac defibrillator: Secondary | ICD-10-CM | POA: Diagnosis not present

## 2023-05-04 DIAGNOSIS — R42 Dizziness and giddiness: Secondary | ICD-10-CM | POA: Diagnosis not present

## 2023-05-04 DIAGNOSIS — I447 Left bundle-branch block, unspecified: Secondary | ICD-10-CM

## 2023-05-04 MED ORDER — POTASSIUM CHLORIDE ER 10 MEQ PO TBCR: 10.0000 meq | EXTENDED_RELEASE_TABLET | ORAL | 3 refills | Status: DC

## 2023-05-04 MED ORDER — VALSARTAN 40 MG PO TABS: 40.0000 mg | ORAL_TABLET | Freq: Every day | ORAL | 3 refills | Status: DC

## 2023-05-04 NOTE — Telephone Encounter (Signed)
 Patient has scheduled appt to see Dr. Mariah Milling this afternoon at 4:00 pm

## 2023-05-04 NOTE — Patient Instructions (Addendum)
 Medication Instructions:  When entresto runs out, start valsartan 40 mg daily Stay on spironolactone 25 mg daily Lasix 20 daily with extra 20 as needed  Add potassium 10 meq every other day  If you need a refill on your cardiac medications before your next appointment, please call your pharmacy.   Lab work: No new labs needed  Testing/Procedures: No new testing needed  Follow-Up: At [facility], you and your health needs are our priority.  As part of our continuing mission to provide you with exceptional heart care, we have created designated Provider Care Teams.  These Care Teams include your primary Cardiologist (physician) and Advanced Practice Providers (APPs -  Physician Assistants and Nurse Practitioners) who all work together to provide you with the care you need, when you need it.  You will need a follow up appointment in 12 months  Providers on your designated Care Team:   Nicolasa Ducking, NP Eula Listen, PA-C Cadence Fransico [place], [place]  COVID-19 Vaccine Information can be found at: [email or web address] For questions related to vaccine distribution or appointments, please email [email or web address] or call [phone].
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
## 2023-05-07 DIAGNOSIS — M7061 Trochanteric bursitis, right hip: Secondary | ICD-10-CM | POA: Diagnosis not present

## 2023-05-07 DIAGNOSIS — M1611 Unilateral primary osteoarthritis, right hip: Secondary | ICD-10-CM | POA: Diagnosis not present

## 2023-05-12 ENCOUNTER — Encounter: Payer: Self-pay | Admitting: Internal Medicine

## 2023-05-18 ENCOUNTER — Ambulatory Visit: Payer: No Typology Code available for payment source | Admitting: Internal Medicine

## 2023-05-18 ENCOUNTER — Encounter: Payer: Self-pay | Admitting: Internal Medicine

## 2023-05-18 VITALS — BP 102/64 | HR 95 | Ht 65.5 in | Wt 177.0 lb

## 2023-05-18 DIAGNOSIS — Z1231 Encounter for screening mammogram for malignant neoplasm of breast: Secondary | ICD-10-CM

## 2023-05-18 DIAGNOSIS — M542 Cervicalgia: Secondary | ICD-10-CM | POA: Diagnosis not present

## 2023-05-18 DIAGNOSIS — R7301 Impaired fasting glucose: Secondary | ICD-10-CM | POA: Diagnosis not present

## 2023-05-18 DIAGNOSIS — E782 Mixed hyperlipidemia: Secondary | ICD-10-CM

## 2023-05-18 DIAGNOSIS — Z0000 Encounter for general adult medical examination without abnormal findings: Secondary | ICD-10-CM | POA: Diagnosis not present

## 2023-05-18 DIAGNOSIS — R5383 Other fatigue: Secondary | ICD-10-CM | POA: Diagnosis not present

## 2023-05-18 DIAGNOSIS — I10 Essential (primary) hypertension: Secondary | ICD-10-CM | POA: Diagnosis not present

## 2023-05-18 DIAGNOSIS — E663 Overweight: Secondary | ICD-10-CM | POA: Diagnosis not present

## 2023-05-18 LAB — COMPREHENSIVE METABOLIC PANEL
ALT: 18 U/L (ref 0–35)
AST: 22 U/L (ref 0–37)
Albumin: 4.3 g/dL (ref 3.5–5.2)
Alkaline Phosphatase: 101 U/L (ref 39–117)
BUN: 24 mg/dL — ABNORMAL HIGH (ref 6–23)
CO2: 29 meq/L (ref 19–32)
Calcium: 9.5 mg/dL (ref 8.4–10.5)
Chloride: 102 meq/L (ref 96–112)
Creatinine, Ser: 1.34 mg/dL — ABNORMAL HIGH (ref 0.40–1.20)
GFR: 42.66 mL/min — ABNORMAL LOW (ref >60.00)
Glucose, Bld: 85 mg/dL (ref 70–99)
Potassium: 3.9 meq/L (ref 3.5–5.1)
Sodium: 139 meq/L (ref 135–145)
Total Bilirubin: 0.5 mg/dL (ref 0.2–1.2)
Total Protein: 7 g/dL (ref 6.0–8.3)

## 2023-05-18 LAB — MICROALBUMIN / CREATININE URINE RATIO
Creatinine,U: 153.4 mg/dL
Microalb Creat Ratio: UNDETERMINED mg/g (ref 0.0–30.0)
Microalb, Ur: <0.7 mg/dL

## 2023-05-18 LAB — LIPID PANEL
Cholesterol: 134 mg/dL (ref 0–200)
HDL: 58.3 mg/dL (ref >39.00)
LDL Cholesterol: 64 mg/dL (ref 0–99)
NonHDL: 75.6
Total CHOL/HDL Ratio: 2
Triglycerides: 60 mg/dL (ref 0.0–149.0)
VLDL: 12 mg/dL (ref 0.0–40.0)

## 2023-05-18 LAB — CBC WITH DIFFERENTIAL/PLATELET
Basophils Absolute: 0.1 10*3/uL (ref 0.0–0.1)
Basophils Relative: 1.1 % (ref 0.0–3.0)
Eosinophils Absolute: 0.1 10*3/uL (ref 0.0–0.7)
Eosinophils Relative: 2.2 % (ref 0.0–5.0)
HCT: 43.1 % (ref 36.0–46.0)
Hemoglobin: 14.4 g/dL (ref 12.0–15.0)
Lymphocytes Relative: 23.3 % (ref 12.0–46.0)
Lymphs Abs: 1.3 10*3/uL (ref 0.7–4.0)
MCHC: 33.3 g/dL (ref 30.0–36.0)
MCV: 95.6 fl (ref 78.0–100.0)
Monocytes Absolute: 0.7 10*3/uL (ref 0.1–1.0)
Monocytes Relative: 11.6 % (ref 3.0–12.0)
Neutro Abs: 3.5 10*3/uL (ref 1.4–7.7)
Neutrophils Relative %: 61.8 % (ref 43.0–77.0)
Platelets: 213 10*3/uL (ref 150.0–400.0)
RBC: 4.51 Mil/uL (ref 3.87–5.11)
RDW: 13.9 % (ref 11.5–15.5)
WBC: 5.7 10*3/uL (ref 4.0–10.5)

## 2023-05-18 LAB — LDL CHOLESTEROL, DIRECT: Direct LDL: 60 mg/dL

## 2023-05-18 LAB — HEMOGLOBIN A1C: Hgb A1c MFr Bld: 6.1 % (ref 4.6–6.5)

## 2023-05-18 LAB — TSH: TSH: 0.55 u[IU]/mL (ref 0.35–5.50)

## 2023-05-18 MED ORDER — CYANOCOBALAMIN 1000 MCG/ML IJ SOLN: 1000.0000 ug | INTRAMUSCULAR | 1 refills | Status: AC

## 2023-05-18 MED ORDER — BACLOFEN 10 MG PO TABS: 10.0000 mg | ORAL_TABLET | Freq: Three times a day (TID) | ORAL | 5 refills | Status: AC

## 2023-05-18 MED ORDER — VENLAFAXINE HCL ER 150 MG PO CP24: ORAL_CAPSULE | ORAL | 1 refills | Status: DC

## 2023-05-18 MED ORDER — DULOXETINE HCL 30 MG PO CPEP: 30.0000 mg | ORAL_CAPSULE | Freq: Every day | ORAL | 1 refills | Status: DC

## 2023-05-18 MED ORDER — TRAZODONE HCL 50 MG PO TABS: ORAL_TABLET | ORAL | 1 refills | Status: DC

## 2023-05-18 MED ORDER — GABAPENTIN 100 MG PO CAPS: 100.0000 mg | ORAL_CAPSULE | Freq: Three times a day (TID) | ORAL | 1 refills | Status: AC

## 2023-05-18 NOTE — Progress Notes (Unsigned)
 Patient ID: Suzanne Spears, female    DOB: 1960-06-11  Age: 63 y.o. MRN: [id number]  The patient is here for annual preventive examination and management of other chronic and acute problems.   The risk factors are reflected in the social history.   The roster of all physicians providing medical care to patient - is listed in the Snapshot section of the chart.   Activities of daily living:  The patient is 100% independent in all ADLs: dressing, toileting, feeding as well as independent mobility   Home safety : The patient has smoke detectors in the home. They wear seatbelts.  There are no unsecured firearms at home. There is no violence in the home.    There is no risks for hepatitis, STDs or HIV. There is no   history of blood transfusion. They have no travel history to infectious disease endemic areas of the world.   The patient has seen their dentist in the last six month. They have seen their eye doctor in the last year. The patinet  denies slight hearing difficulty with regard to whispered voices and some television programs.  They have deferred audiologic testing in the last year.  They do not  have excessive sun exposure. Discussed the need for sun protection: hats, long sleeves and use of sunscreen if there is significant sun exposure.    Diet: the importance of a healthy diet is discussed. They do have a healthy diet.   The benefits of regular aerobic exercise were discussed. The patient  has not been exercisign but is physically active .    Depression screen: there are no signs or vegative symptoms of depression- irritability, change in appetite, anhedonia, sadness/tearfullness. In fact she appears much more relaxed and happy than ever before,  which she attributes to retiring in Jan 2025 and spending more time at home and with grandchildren   The following portions of the patient's history were reviewed and updated as appropriate: allergies, current medications, past family history,  past medical history,  past surgical history, past social history  and problem list.   Visual acuity was not assessed per patient preference since the patient has regular follow up with an  ophthalmologist. Hearing and body mass index were assessed and reviewed.    During the course of the visit the patient was educated and counseled about appropriate screening and preventive services including : fall prevention , diabetes screening, nutrition counseling, colorectal cancer screening, and recommended immunizations.    Chief Complaint:  1) overweight:  patient is concerned about her weight discussed diet and need for regular exercise   2) pain on left side of throat with swallowing radiates to ear present for 2 weeks.  No dysphagia ,  no rhinitis , sneezing,  fevers,  or sinus congestion .  When she swallows in supine position SHE  feels a "catch" in her throat .  Uses a neck pillow   has cervical ddd with osteophytes   Review of Symptoms  Patient denies headache, fevers, malaise, unintentional weight loss, skin rash, eye pain, sinus congestion and sinus pain,   hemoptysis , cough, dyspnea, wheezing, chest pain, palpitations, orthopnea, edema, abdominal pain, nausea, melena, diarrhea, constipation, flank pain, dysuria, hematuria, urinary  Frequency, nocturia, numbness, tingling, seizures,  Focal weakness, Loss of consciousness,  Tremor, insomnia, depression, anxiety, and suicidal ideation.    Physical Exam:  BP 102/64   Pulse 95   Ht 5' 5.5" (1.664 m)   Wt 177 lb (80.3 kg)  LMP 07/12/2016   SpO2 96%   BMI 29.01 kg/m    Physical Exam Vitals reviewed.  Constitutional:      General: She is not in acute distress.    Appearance: Normal appearance. She is well-developed and normal weight. She is not ill-appearing, toxic-appearing or diaphoretic.  HENT:     Head: Normocephalic.     Right Ear: Tympanic membrane, ear canal and external ear normal. There is no impacted cerumen.     Left Ear:  Tympanic membrane, ear canal and external ear normal. There is no impacted cerumen.     Nose: Nose normal.     Mouth/Throat:     Mouth: Mucous membranes are moist.     Pharynx: Oropharynx is clear.  Eyes:     General: No scleral icterus.       Right eye: No discharge.        Left eye: No discharge.     Conjunctiva/sclera: Conjunctivae normal.     Pupils: Pupils are equal, round, and reactive to light.  Neck:     Thyroid: No thyromegaly.     Vascular: No carotid bruit or JVD.   Cardiovascular:     Rate and Rhythm: Normal rate and regular rhythm.     Heart sounds: Normal heart sounds.  Pulmonary:     Effort: Pulmonary effort is normal. No respiratory distress.     Breath sounds: Normal breath sounds.  Chest:  Breasts:    Breasts are symmetrical.     Right: Normal. No swelling, inverted nipple, mass, nipple discharge, skin change or tenderness.     Left: Normal. No swelling, inverted nipple, mass, nipple discharge, skin change or tenderness.  Abdominal:     General: Bowel sounds are normal.     Palpations: Abdomen is soft. There is no mass.     Tenderness: There is no abdominal tenderness. There is no guarding or rebound.  Musculoskeletal:        General: Normal range of motion.     Cervical back: Normal range of motion and neck supple. Torticollis and tenderness present. Muscular tenderness present.  Lymphadenopathy:     Cervical: No cervical adenopathy.     Upper Body:     Right upper body: No supraclavicular, axillary or pectoral adenopathy.     Left upper body: No supraclavicular, axillary or pectoral adenopathy.  Skin:    General: Skin is warm and dry.  Neurological:     General: No focal deficit present.     Mental Status: She is alert and oriented to person, place, and time. Mental status is at baseline.  Psychiatric:        Mood and Affect: Mood normal.        Behavior: Behavior normal.        Thought Content: Thought content normal.        Judgment: Judgment  normal.   Assessment and Plan: Encounter for screening mammogram for malignant neoplasm of breast  Visit for preventive health examination  Essential (primary) hypertension  Fatigue, unspecified type  Hyperlipidemia, mixed  Impaired fasting glucose    No follow-ups on file.  Sherlene Shams, MD

## 2023-05-18 NOTE — Assessment & Plan Note (Signed)

## 2023-05-18 NOTE — Patient Instructions (Addendum)
 1) the bone spurs on your cervical disks are the likely cause of your throat pain and "catching " feeling,  we can confirm this with a swallow evaluation if it gets worse.  2) There are no "SAFE" medications to curb appetite.      Here are some prepared foods that will help keep you from blowing your diet  ON "HIDDEN CALORIE" MEALS, because:   Oatmeal has more carbs and less fiber than you think.Marland KitchenMarland KitchenRead the label     [place] yogurt is loaded with sugar unless you choose unflavored (NOT VANILLA ) .   TRY one of these brands that use artificial or natural low glycemic index sweeteners:  Dannon Lt n Fit Greek 2 Good 2 Be True Oikos Triple Zero   You can also  a premixed protein drink   Premier Protein' Fairlife Muscle Milk Atkins Aadvantage   .    Vilma Meckel and Veggies made Randie Heinz are two companies that make individual servings of  low carb frittata's (quiche without the crust)   RealGood has a line of low carb entree's  and "fun food"  including enchiladas and  bacon wrapped jalapeno's,   Healthy Choice  "low carb power bowl"  entrees and  "Steamer" entrees are are great low carb entrees that microwave in 5   MIN   AND aure usually < 250  CAL  and < 12 NET CARBS   Healthy /choice makes low cal/carb fudgsicles and brownies  , and KIND makes low carb mini sized bars  for snacks

## 2023-05-19 ENCOUNTER — Encounter: Payer: Self-pay | Admitting: Internal Medicine

## 2023-05-19 NOTE — Assessment & Plan Note (Signed)
I have addressed  BMI and recommended a low glycemic index diet regular participation in aerobic exercise with a goal of 30 minutes of aerobic exercise a minimum of 5 days per week. Screening for lipid disorders, thyroid and diabetes to be done today.  

## 2023-05-19 NOTE — Assessment & Plan Note (Signed)
 Oropharynx exam is benign.  Her pain may be to extrinsic pressure from osteophytes noted on anterior surface of multiple upper cervical vertebrae

## 2023-06-01 ENCOUNTER — Ambulatory Visit: Attending: Internal Medicine

## 2023-06-01 DIAGNOSIS — Z95810 Presence of automatic (implantable) cardiac defibrillator: Secondary | ICD-10-CM | POA: Diagnosis not present

## 2023-06-01 DIAGNOSIS — I5032 Chronic diastolic (congestive) heart failure: Secondary | ICD-10-CM

## 2023-06-01 NOTE — Addendum Note (Signed)
 Addended by: Geralyn Flash D on: 06/01/2023 01:34 PM   Modules accepted: Orders

## 2023-06-01 NOTE — Progress Notes (Signed)
 Remote ICD transmission.

## 2023-06-02 ENCOUNTER — Telehealth: Payer: Self-pay

## 2023-06-02 NOTE — Progress Notes (Signed)
 EPIC Encounter for ICM Monitoring  Patient Name: Suzanne Spears is a 63 y.o. female Date: 06/02/2023 Primary Care Physican: Sherlene Shams, MD Primary Cardiologist: Mariah Milling Electrophysiologist: Joycelyn Schmid Pacing:  98.5%    06/04/2022 Weight: 159 lbs 07/25/2022 Office Weight: 165 lbs 10/29/2022 Weight: 170 lbs 03/27/2023 Weight: 174 lbs   Since 28-Apr-2023 VT-NS (>4 beats, >200 bpm)  1 Time in AT/AF <0.1 hr/day (<0.1%)           Attempted call to patient and unable to reach.  Left detailed message per DPR regarding transmission.  Transmission results reviewed.      Diet: Does not adhere to low salt diet   OptiVol Thoracic impedance suggesting possible fluid accumulation starting 3/27.   Prescribed:  Furosemide 20 mg Take1 tablet (20 mg) by mouth as directed. Take 1 tablet daily. May take extra 20 mg as needed for weight gain or swelling.   Spironolactone 25 mg take 1 tablet daily   Labs: 05/18/2023 Creatinine 1.34, BUN 24, Potassium 3.9, Sodium 139, GFR 43.66 12/08/2022 Creatinine 1.20, BUN 22, Potassium 3.6, Sodium 138, GFR 52 A complete set of results can be found in Results Review.   Recommendations:  Left voice mail with ICM number and encouraged to call if experiencing any fluid symptoms.   Follow-up plan: ICM clinic phone appointment on 06/08/2023 to recheck fluid levels.  91 day device clinic remote transmission 07/28/2023.     EP/Cardiology Office Visits:  Recall 01/29/2024 with Dr Graciela Husbands.   Recall 05/03/2024 with Dr Mariah Milling.   Copy of ICM check sent to Dr. Graciela Husbands.   3 month ICM trend: 06/02/2023.    12-14 Month ICM trend:     Karie Soda, RN 06/02/2023 10:16 AM

## 2023-06-02 NOTE — Telephone Encounter (Signed)
 Remote ICM transmission received.  Attempted call to patient regarding ICM remote transmission and left detailed message per DPR.  Left ICM phone number and advised to return call for any fluid symptoms or questions. Next ICM remote transmission scheduled 06/08/2023.

## 2023-06-08 ENCOUNTER — Ambulatory Visit: Attending: Internal Medicine

## 2023-06-08 DIAGNOSIS — Z95810 Presence of automatic (implantable) cardiac defibrillator: Secondary | ICD-10-CM

## 2023-06-08 DIAGNOSIS — I5032 Chronic diastolic (congestive) heart failure: Secondary | ICD-10-CM

## 2023-06-09 ENCOUNTER — Telehealth: Payer: Self-pay

## 2023-06-09 NOTE — Telephone Encounter (Signed)
 Remote ICM transmission received.  Attempted call to patient regarding ICM remote transmission and left detailed message per DPR.  Left ICM phone number and advised to return call for any fluid symptoms or questions. Next ICM remote transmission scheduled 06/15/2023.

## 2023-06-09 NOTE — Progress Notes (Signed)
 EPIC Encounter for ICM Monitoring  Patient Name: Suzanne Spears is a 63 y.o. female Date: 06/09/2023 Primary Care Physican: Thersia Flax, MD Primary Cardiologist: Jerelene Monday Electrophysiologist: Victorino Grates Pacing:  98.5%    06/04/2022 Weight: 159 lbs 07/25/2022 Office Weight: 165 lbs 10/29/2022 Weight: 170 lbs 03/27/2023 Weight: 174 lbs   Since 01-Jun-2023 Time in AT/AF <0.1 hr/day (<0.1%)           Attempted call to patient and unable to reach.  Left detailed message per DPR regarding transmission.  Transmission results reviewed.      Diet: Does not adhere to low salt diet   OptiVol Thoracic impedance suggesting possible fluid accumulation is ongoing since 3/27.   Prescribed:  Furosemide 20 mg Take1 tablet (20 mg) by mouth as directed. Take 1 tablet daily. May take extra 20 mg as needed for weight gain or swelling.   Spironolactone 25 mg take 1 tablet daily   Labs: 05/18/2023 Creatinine 1.34, BUN 24, Potassium 3.9, Sodium 139, GFR 43.66 12/08/2022 Creatinine 1.20, BUN 22, Potassium 3.6, Sodium 138, GFR 52 A complete set of results can be found in Results Review.   Recommendations:  Left voice mail with ICM number and encouraged to call if experiencing any fluid symptoms.  Will advise patient take extra Lasix if reached.     Follow-up plan: ICM clinic phone appointment on 06/15/2023 to recheck fluid levels.  91 day device clinic remote transmission 07/28/2023.     EP/Cardiology Office Visits:  Recall 01/29/2024 with Dr Rodolfo Clan.   Recall 05/03/2024 with Dr Gollan.   Copy of ICM check sent to Dr. Rodolfo Clan.   3 month ICM trend: 06/08/2023.    12-14 Month ICM trend:     Almyra Jain, RN 06/09/2023 8:49 AM

## 2023-06-15 ENCOUNTER — Ambulatory Visit: Attending: Internal Medicine

## 2023-06-15 ENCOUNTER — Telehealth: Payer: Self-pay

## 2023-06-15 DIAGNOSIS — Z95810 Presence of automatic (implantable) cardiac defibrillator: Secondary | ICD-10-CM

## 2023-06-15 DIAGNOSIS — I5032 Chronic diastolic (congestive) heart failure: Secondary | ICD-10-CM

## 2023-06-15 NOTE — Progress Notes (Signed)
 EPIC Encounter for ICM Monitoring  Patient Name: Suzanne Spears is a 63 y.o. female Date: 06/15/2023 Primary Care Physican: Thersia Flax, MD Primary Cardiologist: Jerelene Monday Electrophysiologist: Victorino Grates Pacing:  98.5%    06/04/2022 Weight: 159 lbs 07/25/2022 Office Weight: 165 lbs 10/29/2022 Weight: 170 lbs 03/27/2023 Weight: 174 lbs   Clinical Status Since 08-Jun-2023 Time in AT/AF <0.1 hr/day (<0.1%)           Attempted call to patient and unable to reach.  Left detailed message per DPR regarding transmission.  Transmission results reviewed.    Diet: Does not adhere to low salt diet   OptiVol Thoracic impedance suggesting possible fluid accumulation is ongoing since 3/27 but trending back toward baseline.   Prescribed:  Furosemide  20 mg Take1 tablet (20 mg) by mouth as directed. Take 1 tablet daily. May take extra 20 mg as needed for weight gain or swelling.   Spironolactone  25 mg take 1 tablet daily   Labs: 05/18/2023 Creatinine 1.34, BUN 24, Potassium 3.9, Sodium 139, GFR 43.66 12/08/2022 Creatinine 1.20, BUN 22, Potassium 3.6, Sodium 138, GFR 52 A complete set of results can be found in Results Review.   Recommendations:  Left voice mail with ICM number and encouraged to call if experiencing any fluid symptoms.   Follow-up plan: ICM clinic phone appointment on 07/02/2023.  91 day device clinic remote transmission 07/28/2023.     EP/Cardiology Office Visits:  Recall 01/29/2024 with Dr Rodolfo Clan.   Recall 05/03/2024 with Dr Gollan.   Copy of ICM check sent to Dr. Rodolfo Clan.   3 month ICM trend: 06/15/2023.    12-14 Month ICM trend:     Almyra Jain, RN 06/15/2023 1:58 PM

## 2023-06-15 NOTE — Telephone Encounter (Signed)
 Remote ICM transmission received.  Attempted call to patient regarding ICM remote transmission and left detailed message per DPR.  Left ICM phone number and advised to return call for any fluid symptoms or questions. Next ICM remote transmission scheduled 07/02/2023.

## 2023-07-02 ENCOUNTER — Ambulatory Visit: Attending: Cardiology

## 2023-07-02 DIAGNOSIS — I5032 Chronic diastolic (congestive) heart failure: Secondary | ICD-10-CM | POA: Diagnosis not present

## 2023-07-02 DIAGNOSIS — Z95810 Presence of automatic (implantable) cardiac defibrillator: Secondary | ICD-10-CM

## 2023-07-02 NOTE — Progress Notes (Signed)
 EPIC Encounter for ICM Monitoring  Patient Name: Suzanne Spears is a 63 y.o. female Date: 07/02/2023 Primary Care Physican: Thersia Flax, MD Primary Cardiologist: Jerelene Monday Electrophysiologist: Gareth Junes Pacing:  98.6%    06/04/2022 Weight: 159 lbs 07/25/2022 Office Weight: 165 lbs 10/29/2022 Weight: 170 lbs 03/27/2023 Weight: 174 lbs   Clinical Status Since 15-Jun-2023 Time in AT/AF <0.1 hr/day (<0.1%)  VT-NS (>4 beats, >200 bpm)  2          Transmission results reviewed.    Diet: Does not adhere to low salt diet   OptiVol Thoracic impedance suggesting normal fluid levels since 4/21.   Prescribed:  Furosemide  20 mg Take1 tablet (20 mg) by mouth as directed. Take 1 tablet daily. May take extra 20 mg as needed for weight gain or swelling.   Spironolactone  25 mg take 1 tablet daily   Labs: 05/18/2023 Creatinine 1.34, BUN 24, Potassium 3.9, Sodium 139, GFR 43.66 12/08/2022 Creatinine 1.20, BUN 22, Potassium 3.6, Sodium 138, GFR 52 A complete set of results can be found in Results Review.   Recommendations:  No changes.   Follow-up plan: ICM clinic phone appointment on 08/10/2023.  91 day device clinic remote transmission 07/28/2023.     EP/Cardiology Office Visits:  Recall 01/29/2024 with Dr Rodolfo Clan.   Recall 05/03/2024 with Dr Gollan.   Copy of ICM check sent to Dr. Daneil Dunker.   3 month ICM trend: 07/02/2023.    12-14 Month ICM trend:     Suzanne Jain, RN 07/02/2023 3:21 PM

## 2023-07-21 ENCOUNTER — Other Ambulatory Visit: Payer: Self-pay | Admitting: Internal Medicine

## 2023-07-21 ENCOUNTER — Telehealth: Payer: Self-pay | Admitting: Cardiovascular Disease

## 2023-07-21 MED ORDER — ROSUVASTATIN CALCIUM 5 MG PO TABS: 5.0000 mg | ORAL_TABLET | Freq: Every day | ORAL | 8 refills | Status: AC

## 2023-07-21 MED ORDER — EZETIMIBE 10 MG PO TABS: 10.0000 mg | ORAL_TABLET | Freq: Every day | ORAL | 8 refills | Status: AC

## 2023-07-21 NOTE — Telephone Encounter (Signed)
 Routing to PCP to approve refills due to high medication risk alert:  High Drug-Drug: traZODone  and venlafaxine  XR, DULoxetine 

## 2023-07-21 NOTE — Telephone Encounter (Signed)
*  STAT* If patient is at the pharmacy, call can be transferred to refill team.   1. Which medications need to be refilled? (please list name of each medication and dose if known)   ezetimibe  (ZETIA ) 10 MG tablet  rosuvastatin  (CRESTOR ) 5 MG tablet   2. Would you like to learn more about the convenience, safety, & potential cost savings by using the [facility]?   3. Are you open to using the [facility] (Type [facility]. ).  4. Which pharmacy/location (including street and city if local pharmacy) is medication to be sent to?  [facility] - [place], [place] - [address]   5. Do they need a 30 day or 90 day supply?   30 day  Patient stated she only has 4 tablets left and will be going out of town.

## 2023-07-21 NOTE — Telephone Encounter (Unsigned)
 Copied from CRM [id number]. Topic: Clinical - Medication Refill >> Jul 21, 2023 10:28 AM Dimple Francis wrote: Medication:  venlafaxine  XR (EFFEXOR -XR) 150 MG 24 hr capsule ; DULoxetine  (CYMBALTA ) 30 MG capsule  Has the patient contacted their pharmacy? Yes (Agent: If no, request that the patient contact the pharmacy for the refill. If patient does not wish to contact the pharmacy document the reason why and proceed with request.) (Agent: If yes, when and what did the pharmacy advise?)  This is the patient's preferred pharmacy:  [facility] - [place], [place] - [address] [place] [address] Phone: [phone] Fax: [phone]  Is this the correct pharmacy for this prescription? Yes If no, delete pharmacy and type the correct one.   Has the prescription been filled recently? Yes  Is the patient out of the medication? Yes  Has the patient been seen for an appointment in the last year OR does the patient have an upcoming appointment? Yes  Can we respond through MyChart? Yes  Agent: Please be advised that Rx refills may take up to 3 business days. We ask that you follow-up with your pharmacy.

## 2023-07-22 MED ORDER — VENLAFAXINE HCL ER 150 MG PO CP24: ORAL_CAPSULE | ORAL | 1 refills | Status: DC

## 2023-07-22 MED ORDER — DULOXETINE HCL 30 MG PO CPEP: 30.0000 mg | ORAL_CAPSULE | Freq: Every day | ORAL | 1 refills | Status: AC

## 2023-07-28 ENCOUNTER — Ambulatory Visit: Payer: Self-pay

## 2023-08-06 ENCOUNTER — Ambulatory Visit
Admission: RE | Admit: 2023-08-06 | Discharge: 2023-08-06 | Disposition: A | Discharge status: Home / Self Care | Source: Ambulatory Visit | Attending: Internal Medicine | Admitting: Internal Medicine

## 2023-08-06 DIAGNOSIS — Z1231 Encounter for screening mammogram for malignant neoplasm of breast: Secondary | ICD-10-CM | POA: Diagnosis not present

## 2023-08-10 ENCOUNTER — Ambulatory Visit: Attending: Cardiology

## 2023-08-10 DIAGNOSIS — I5032 Chronic diastolic (congestive) heart failure: Secondary | ICD-10-CM | POA: Diagnosis not present

## 2023-08-10 DIAGNOSIS — Z95810 Presence of automatic (implantable) cardiac defibrillator: Secondary | ICD-10-CM

## 2023-08-12 NOTE — Progress Notes (Signed)
 EPIC Encounter for ICM Monitoring  Patient Name: Suzanne Spears is a 63 y.o. female Date: 08/12/2023 Primary Care Physican: Thersia Flax, MD Primary Cardiologist: Jerelene Monday Electrophysiologist: Gareth Junes Pacing:  98.22%    10/29/2022 Weight: 170 lbs 03/27/2023 Weight: 174 lbs 08/12/2023 Weight: 170.2 lbs   Clinical Status Since 02-Jul-2023 Time in AT/AF <0.1 hr/day (<0.1%)           Spoke with patient and heart failure questions reviewed.  Transmission results reviewed.  Pt reports leg swelling during decreased impedance but is fine now.  Reports feeling well at this time and voices no complaints.  Has been out of town more in the last month.     Diet: Does not adhere to low salt diet   OptiVol Thoracic impedance suggesting possible fluid accumulation starting 5/16 with some days at baseline in May and 6/14.  Fluid index > than normal threshold starting 5/21.   Prescribed:  Furosemide  20 mg Take1 tablet (20 mg) by mouth as directed. Take 1 tablet daily. May take extra 20 mg as needed for weight gain or swelling.   Spironolactone  25 mg take 1 tablet daily   Labs: 05/18/2023 Creatinine 1.34, BUN 24, Potassium 3.9, Sodium 139, GFR 43.66 12/08/2022 Creatinine 1.20, BUN 22, Potassium 3.6, Sodium 138, GFR 52 A complete set of results can be found in Results Review.   Recommendations:  No changes and encouraged to call if experiencing any fluid symptoms.   Follow-up plan: ICM clinic phone appointment on 09/28/2023.  91 day device clinic remote transmission 10/27/2023.     EP/Cardiology Office Visits:  Recall 01/29/2024 with Dr Rodolfo Clan.  Recall 05/03/2024 with Dr Gollan.   Copy of ICM check sent to Dr. Daneil Dunker.    3 month ICM trend: 08/10/2023.    12-14 Month ICM trend:     Almyra Jain, RN 08/12/2023 10:31 AM

## 2023-08-17 ENCOUNTER — Telehealth: Payer: Self-pay | Admitting: Cardiovascular Disease

## 2023-08-17 DIAGNOSIS — I5032 Chronic diastolic (congestive) heart failure: Secondary | ICD-10-CM

## 2023-08-17 MED ORDER — FUROSEMIDE 20 MG PO TABS
20.0000 mg | ORAL_TABLET | ORAL | 3 refills | Status: DC
Start: 2023-08-17 — End: 2023-11-18

## 2023-08-17 MED ORDER — SPIRONOLACTONE 25 MG PO TABS: 25.0000 mg | ORAL_TABLET | Freq: Every day | ORAL | 3 refills | Status: DC

## 2023-08-17 NOTE — Telephone Encounter (Signed)
*  STAT* If patient is at the pharmacy, call can be transferred to refill team.   1. Which medications need to be refilled? (please list name of each medication and dose if known)  furosemide  (LASIX ) 20 MG tablet spironolactone  (ALDACTONE ) 25 MG tablet   2. Which pharmacy/location (including street and city if local pharmacy) is medication to be sent to? [facility] - [place], [place] - [address]    3. Do they need a 30 day or 90 day supply?  90 day supply

## 2023-09-03 NOTE — Progress Notes (Signed)
 Cardiology Office Note   Date:  09/04/2023  ID:  Suzanne Spears, DOB Nov 25, 1960, MRN [id number] PCP: Marylynn Verneita CROME, MD  [facility] Providers Cardiologist:  Evalene Lunger, MD Cardiology APP:  Gerard Frederick, NP  Electrophysiologist:  Elspeth Sage, MD     History of Present Illness Suzanne Spears is a 63 y.o. female with a past medical history of nonischemic cardiomyopathy status post CRT-D-ICD, HFmrEF, NICM, beta-blocker intolerance, chronic left bundle branch block, PE (2016), CKD stage III, mitral regurgitation/MVP, hypertension, depression, chronic neck pain, and GERD, who presents today for follow-up.   She previously underwent diagnostic catheterization in 12/2004, which showed normal coronary arteries.  Echocardiogram in 2002 showed an EF of 35%.  She was previously followed by [facility] clinic.  In April 2016 in the setting of LV dysfunction a left bundle branch block she underwent CRT-D.  Unfortunately this was complicated by lead dislodgment requiring  placement of an epicardial lead.  Postoperative course was complicated by PE.  CPX testing in December 2017 showed at least mild to moderate circulatory limitation and submaximal effort.  Chest CT in 2016 did not show any significant coronary calcification or aortic atherosclerosis.  Echocardiogram in 2021 revealed LVEF of 40-45%.  Repeat echocardiogram done in 06/2021 revealed an LVEF still of 40-45%.  August 2023 she required an ICD generator change.   Was last seen in clinic 05/04/2023 by Dr. Gollan. She had recently retired and her insurance had changed and she was unable to afford her Entresto  and Farxiga .  Paperwork was placed for patient assistance.  She returns to us  today with concerns of leg pain and a rash that she has noted on her legs and to the right clavicle.  She states that she has left leg pain and has this raised rash that had come up after her legs and swelling. Occasional swelling at where she tries to wear  her compression stockings but in the summer is extremely hot.  She notes occasional shortness of breath.  She denies any chest pain, palpitations, lightheadedness or dizziness.  States that she has been compliant with her current medication regimen without any adverse side effects.  Denies any hospitalizations or visits to the emergency department.  ROS: 10 point review of system has been reviewed and considered negative except ones were listed in the HPI  Studies Reviewed EKG Interpretation Date/Time:  Friday September 04 2023 08:50:16 EDT Ventricular Rate:  79 PR Interval:  102 QRS Duration:  112 QT Interval:  432 QTC Calculation: 495 R Axis:   -83  Text Interpretation: Atrial-sensed ventricular-paced rhythm When compared with ECG of 04-May-2023 16:05, No significant change since last tracing Confirmed by Gerard Frederick ([id number]) on 09/04/2023 8:52:34 AM    2D echo 06/27/2021 1. Left ventricular ejection fraction, by estimation, is 40 to 45%. Left  ventricular ejection fraction by 2D MOD biplane is 42.3 %. The left  ventricle has mild to moderately decreased function. The left ventricle  demonstrates global hypokinesis. Left  ventricular diastolic parameters are consistent with Grade II diastolic  dysfunction (pseudonormalization). The average left ventricular global  longitudinal strain is -14.2 %. The global longitudinal strain is  abnormal.   2. Right ventricular systolic function is normal. The right ventricular  size is normal.   3. The mitral valve is degenerative. Mild mitral valve regurgitation.  Moderate mitral annular calcification.   4. The aortic valve is tricuspid. Aortic valve regurgitation is mild to  moderate.   5. The inferior vena cava  is normal in size with greater than 50%  respiratory variability, suggesting right atrial pressure of 3 mmHg.   2D echo 08/18/2019 1. Left ventricular ejection fraction, by estimation, is 40 to 45%. The  left ventricle has mildly decreased  function. The left ventricle  demonstrates global hypokinesis. Left ventricular diastolic parameters are  consistent with Grade I diastolic  dysfunction (impaired relaxation).   2. Right ventricular systolic function is normal. The right ventricular  size is normal. There is normal pulmonary artery systolic pressure.   3. The mitral valve is normal in structure. Trivial mitral valve  regurgitation. No evidence of mitral stenosis.   4. The aortic valve is normal in structure. Aortic valve regurgitation is  moderate. Mild aortic valve sclerosis is present, with no evidence of  aortic valve stenosis.   5. The inferior vena cava is normal in size with greater than 50%  respiratory variability, suggesting right atrial pressure of 3 mmHg.    Risk Assessment/Calculations           Physical Exam VS:  BP (!) 100/58 (BP Location: Left Arm, Patient Position: Sitting, Cuff Size: Normal)   Pulse 79   Ht 5' 5 (1.651 m)   Wt 172 lb 12.8 oz (78.4 kg)   LMP 07/12/2016   SpO2 98%   BMI 28.76 kg/m        Wt Readings from Last 3 Encounters:  09/04/23 172 lb 12.8 oz (78.4 kg)  05/18/23 177 lb (80.3 kg)  05/04/23 178 lb 2 oz (80.8 kg)    GEN: Well nourished, well developed in no acute distress NECK: No JVD; No carotid bruits CARDIAC: RRR, no murmurs, rubs, gallops RESPIRATORY:  Clear to auscultation without rales, wheezing or rhonchi  ABDOMEN: Soft, non-tender, non-distended EXTREMITIES: Trace pretibial edema edema; No deformity ; warmth and mild erythema noted to the bilateral lower extremities with the right greater than the left and an area of rash concerning for cellulitis  ASSESSMENT AND PLAN Cellulitis to the right lower extremity concerning for evolving for the left lower extremity likely related to venous insufficiency and has been recurrent.  Patient has a warmth and elevated rash to the extremity.  She has been started on Keflex  500 mg every 12 hours x 7 days.  Combined systolic and  diastolic heart failure/nonischemic cardiomyopathy dating back/2016 presumed to be viral cardiomyopathy that was nonischemic.  The previously were unable to exclude stress cardiomyopathy.  LVEF 40-45% in 2023.  With her shortness of breath and inability to afford GDMT due to insurance changes she has been scheduled for an updated echocardiogram.  Of note she had previously been beta-blocker intolerant.  She does appear to be euvolemic but it is difficult to tell with concerning progression of cellulitis to the right lower extremity.  She has been continued on furosemide  20 mg daily, spironolactone  25 mg daily, and Diovan  40 mg daily.  With recent infection defer any further escalation of GDMT or medication changes today.  Peripheral edema with varicose veins to the bilateral lower extremity.  She continues on furosemide  and spironolactone .  She has been encouraged to participate in conservative therapy of elevating her extremities, foot calf pumps, decrease her sodium intake, wear compression stockings.  She is also being referred to vein and vascular for venous insufficiency workup due to swelling and recurrent cellulitis.  Cardiac device status post ICD in situ.  Epicardial lead placement where she continues to be followed by EP.  She is concerned about infection on her legs  today impacting her device.  EKG today reveals a sensed V paced at rate 79 device is functioning well with no significant changes noted.  Mixed hyperlipidemia with a direct VL of 16 she is continued on rosuvastatin  and ezetimibe .       Dispo: Patient to return to clinic to see MD/APP in 3 months or sooner if needed for further evaluation  Signed, Zacari Stiff, NP

## 2023-09-04 ENCOUNTER — Ambulatory Visit: Attending: Cardiology | Admitting: Cardiology

## 2023-09-04 ENCOUNTER — Encounter: Payer: Self-pay | Admitting: Cardiology

## 2023-09-04 VITALS — BP 100/58 | HR 79 | Ht 65.0 in | Wt 172.8 lb

## 2023-09-04 DIAGNOSIS — I5042 Chronic combined systolic (congestive) and diastolic (congestive) heart failure: Secondary | ICD-10-CM

## 2023-09-04 DIAGNOSIS — Z95810 Presence of automatic (implantable) cardiac defibrillator: Secondary | ICD-10-CM | POA: Diagnosis not present

## 2023-09-04 DIAGNOSIS — L03818 Cellulitis of other sites: Secondary | ICD-10-CM | POA: Diagnosis not present

## 2023-09-04 DIAGNOSIS — I872 Venous insufficiency (chronic) (peripheral): Secondary | ICD-10-CM

## 2023-09-04 DIAGNOSIS — R600 Localized edema: Secondary | ICD-10-CM | POA: Diagnosis not present

## 2023-09-04 DIAGNOSIS — E782 Mixed hyperlipidemia: Secondary | ICD-10-CM

## 2023-09-04 DIAGNOSIS — I420 Dilated cardiomyopathy: Secondary | ICD-10-CM

## 2023-09-04 MED ORDER — CEPHALEXIN 500 MG PO CAPS
500.0000 mg | ORAL_CAPSULE | Freq: Two times a day (BID) | ORAL | 0 refills | Status: AC
Start: 2023-09-04 — End: 2023-09-11

## 2023-09-04 MED ORDER — FLUCONAZOLE 150 MG PO TABS: 150.0000 mg | ORAL_TABLET | Freq: Every day | ORAL | 0 refills | Status: AC

## 2023-09-04 NOTE — Patient Instructions (Addendum)
 Referral to Vein & Vascular  Medication Instructions:  Your physician recommends the following medication changes.  START TAKING: Keflex  500 mg twice daily for 7 days  *If you need a refill on your cardiac medications before your next appointment, please call your pharmacy*  Lab Work: No labs ordered today  If you have labs (blood work) drawn today and your tests are completely normal, you will receive your results only by: MyChart Message (if you have MyChart) OR A paper copy in the mail If you have any lab test that is abnormal or we need to change your treatment, we will call you to review the results.  Testing/Procedures: Your physician has requested that you have an echocardiogram. Echocardiography is a painless test that uses sound waves to create images of your heart. It provides your doctor with information about the size and shape of your heart and how well your heart's chambers and valves are working.   You may receive an ultrasound enhancing agent through an IV if needed to better visualize your heart during the echo. This procedure takes approximately one hour.  There are no restrictions for this procedure.  This will take place at [address] [facility]) [address], [place] [address]  Please note: We ask at that you not bring children with you during ultrasound (echo/ vascular) testing. Due to room size and safety concerns, children are not allowed in the ultrasound rooms during exams. Our front office staff cannot provide observation of children in our lobby area while testing is being conducted. An adult accompanying a patient to their appointment will only be allowed in the ultrasound room at the discretion of the ultrasound technician under special circumstances. We apologize for any inconvenience.   Follow-Up: At [facility], you and your health needs are our priority.  As part of our continuing mission to provide you with exceptional heart care,  our providers are all part of one team.  This team includes your primary Cardiologist (physician) and Advanced Practice Providers or APPs (Physician Assistants and Nurse Practitioners) who all work together to provide you with the care you need, when you need it.  Your next appointment:   3 month(s)  Provider:   Timothy Gollan, MD or Tylene Lunch, NP

## 2023-09-11 ENCOUNTER — Ambulatory Visit (INDEPENDENT_AMBULATORY_CARE_PROVIDER_SITE_OTHER): Payer: Self-pay | Admitting: Vascular Surgery

## 2023-09-11 ENCOUNTER — Encounter (INDEPENDENT_AMBULATORY_CARE_PROVIDER_SITE_OTHER): Payer: Self-pay | Admitting: Vascular Surgery

## 2023-09-11 VITALS — BP 116/79 | HR 93 | Resp 18 | Ht 65.5 in | Wt 169.6 lb

## 2023-09-11 DIAGNOSIS — I10 Essential (primary) hypertension: Secondary | ICD-10-CM | POA: Diagnosis not present

## 2023-09-11 DIAGNOSIS — I890 Lymphedema, not elsewhere classified: Secondary | ICD-10-CM

## 2023-09-11 DIAGNOSIS — K219 Gastro-esophageal reflux disease without esophagitis: Secondary | ICD-10-CM

## 2023-09-17 ENCOUNTER — Telehealth: Payer: Self-pay | Admitting: Cardiovascular Disease

## 2023-09-17 NOTE — Telephone Encounter (Signed)
 Pt c/o BP issue: STAT if pt c/o blurred vision, one-sided weakness or slurred speech.  STAT if BP is GREATER than 180/120 TODAY.  STAT if BP is LESS than 90/60 and SYMPTOMATIC TODAY  1. What is your BP concern? Low blood pressure  2. Have you taken any BP medication today? She is not sure is she is.   3. What are your last 5 BP readings? 90/72 EMT this morning about 3-4 hours ago  96/69 her BP cuff (this morning) 93/63 her BP cuff 12:15 pm   4. Are you having any other symptoms (ex. Dizziness, headache, blurred vision, passed out)? Patient states she has a headache, and is a little bit lightheaded and breaking out in a sweat when she tries to do things.

## 2023-09-17 NOTE — Telephone Encounter (Signed)
 Called patient back regarding recent call, she states she has been breaking out in a sweat while doing simple things such as ADL's. She states EMS came to take her BP this morning and to compare with her blood pressure machine, she said they were both accurate and the same. She said she was okay now after drinking some and taking some headache medication. Pt agreed with waiting until echocardiogram to move forward with change of care (if any) are made. Advised that if patient was light-headed and passed out that she would definitely need to go to the emergency department. She verbalized understanding of information provided.

## 2023-09-18 ENCOUNTER — Encounter (INDEPENDENT_AMBULATORY_CARE_PROVIDER_SITE_OTHER): Payer: Self-pay | Admitting: Vascular Surgery
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
## 2023-09-28 ENCOUNTER — Ambulatory Visit: Attending: Cardiology

## 2023-09-28 DIAGNOSIS — I5042 Chronic combined systolic (congestive) and diastolic (congestive) heart failure: Secondary | ICD-10-CM

## 2023-09-28 DIAGNOSIS — Z95810 Presence of automatic (implantable) cardiac defibrillator: Secondary | ICD-10-CM | POA: Diagnosis not present

## 2023-10-02 ENCOUNTER — Telehealth: Payer: Self-pay

## 2023-10-02 NOTE — Progress Notes (Signed)
 EPIC Encounter for ICM Monitoring  Patient Name: Suzanne Spears is a 63 y.o. female Date: 10/02/2023 Primary Care Physican: Marylynn Verneita CROME, MD Primary Cardiologist: Perla Electrophysiologist: Kennyth Pore Pacing:  98.4%    10/29/2022 Weight: 170 lbs 03/27/2023 Weight: 174 lbs 08/12/2023 Weight: 170.2 lbs   Clinical Status  Since 10-Aug-2023  Time in AT/AF <0.1 hr/day (<0.1%)           Attempted call to patient and unable to reach.  Left detailed message per DPR regarding transmission.  Transmission results reviewed.   09/04/2023 OV note states patient has cellulitis of her leg.   Diet: Does not adhere to low salt diet   OptiVol Thoracic impedance suggesting intermittent days with possible fluid accumulation starting 7/7 but at baseline on transmission day.   Prescribed:  Furosemide  20 mg Take1 tablet (20 mg) by mouth as directed. Take 1 tablet daily. May take extra 20 mg as needed for weight gain or swelling.   Spironolactone  25 mg take 1 tablet daily   Labs: 05/18/2023 Creatinine 1.34, BUN 24, Potassium 3.9, Sodium 139, GFR 43.66 12/08/2022 Creatinine 1.20, BUN 22, Potassium 3.6, Sodium 138, GFR 52 A complete set of results can be found in Results Review.   Recommendations:  Left voice mail with ICM number and encouraged to call if experiencing any fluid symptoms.   Follow-up plan: ICM clinic phone appointment on 11/02/2023.  91 day device clinic remote transmission 10/27/2023.     EP/Cardiology Office Visits:  Recall 01/29/2024 with Dr Fernande.  12/07/2023 Maeola Lunch, NP.   Copy of ICM check sent to Dr. Kennyth.    3 month ICM trend: 09/28/2023.    12-14 Month ICM trend:     Mitzie GORMAN Garner, RN 10/02/2023 7:47 AM

## 2023-10-02 NOTE — Telephone Encounter (Signed)
 Remote ICM transmission received.  Attempted call to patient regarding ICM remote transmission and left detailed message per DPR.  Left ICM phone number and advised to return call for any fluid symptoms or questions. Next ICM remote transmission scheduled 11/02/2023.

## 2023-10-15 ENCOUNTER — Other Ambulatory Visit (INDEPENDENT_AMBULATORY_CARE_PROVIDER_SITE_OTHER): Payer: Self-pay | Admitting: Vascular Surgery

## 2023-10-15 DIAGNOSIS — M7989 Other specified soft tissue disorders: Secondary | ICD-10-CM

## 2023-10-19 ENCOUNTER — Ambulatory Visit: Attending: Cardiology

## 2023-10-19 DIAGNOSIS — I5042 Chronic combined systolic (congestive) and diastolic (congestive) heart failure: Secondary | ICD-10-CM

## 2023-10-19 LAB — ECHOCARDIOGRAM COMPLETE
AR max vel: 2.36 cm2
AV Area VTI: 2.42 cm2
AV Area mean vel: 2.34 cm2
AV Mean grad: 11 mmHg
AV Peak grad: 19 mmHg
Ao pk vel: 2.18 m/s
Area-P 1/2: 4.06 cm2
S' Lateral: 4.25 cm

## 2023-10-20 ENCOUNTER — Encounter (INDEPENDENT_AMBULATORY_CARE_PROVIDER_SITE_OTHER)

## 2023-10-20 ENCOUNTER — Encounter (INDEPENDENT_AMBULATORY_CARE_PROVIDER_SITE_OTHER): Payer: Self-pay | Admitting: Vascular Surgery

## 2023-10-20 ENCOUNTER — Ambulatory Visit: Payer: Self-pay | Admitting: Cardiology

## 2023-10-20 ENCOUNTER — Ambulatory Visit (INDEPENDENT_AMBULATORY_CARE_PROVIDER_SITE_OTHER)

## 2023-10-20 ENCOUNTER — Ambulatory Visit (INDEPENDENT_AMBULATORY_CARE_PROVIDER_SITE_OTHER): Admitting: Vascular Surgery

## 2023-10-20 VITALS — BP 110/78 | HR 88 | Ht 65.5 in | Wt 172.5 lb

## 2023-10-20 DIAGNOSIS — K219 Gastro-esophageal reflux disease without esophagitis: Secondary | ICD-10-CM | POA: Diagnosis not present

## 2023-10-20 DIAGNOSIS — I10 Essential (primary) hypertension: Secondary | ICD-10-CM | POA: Diagnosis not present

## 2023-10-20 DIAGNOSIS — I890 Lymphedema, not elsewhere classified: Secondary | ICD-10-CM

## 2023-10-20 DIAGNOSIS — M7989 Other specified soft tissue disorders: Secondary | ICD-10-CM
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
## 2023-10-21 ENCOUNTER — Other Ambulatory Visit: Payer: Self-pay | Admitting: Emergency Medicine

## 2023-10-21 ENCOUNTER — Encounter: Payer: Self-pay | Admitting: Cardiovascular Disease

## 2023-10-21 DIAGNOSIS — I5032 Chronic diastolic (congestive) heart failure: Secondary | ICD-10-CM

## 2023-10-21 DIAGNOSIS — I5022 Chronic systolic (congestive) heart failure: Secondary | ICD-10-CM

## 2023-10-21 NOTE — Telephone Encounter (Signed)
Duplicate    See other MyChart encounter

## 2023-10-21 NOTE — Telephone Encounter (Signed)
See result note encounter

## 2023-10-21 NOTE — Telephone Encounter (Signed)
 I have recommended a referral to [facility] to assist with management of worsening EF. Please see previous results follow up note.

## 2023-10-22 DIAGNOSIS — G43119 Migraine with aura, intractable, without status migrainosus: Secondary | ICD-10-CM | POA: Diagnosis not present

## 2023-10-22 DIAGNOSIS — Z1331 Encounter for screening for depression: Secondary | ICD-10-CM | POA: Diagnosis not present

## 2023-10-22 DIAGNOSIS — M7918 Myalgia, other site: Secondary | ICD-10-CM | POA: Diagnosis not present

## 2023-10-22 DIAGNOSIS — H539 Unspecified visual disturbance: Secondary | ICD-10-CM | POA: Diagnosis not present

## 2023-10-27 ENCOUNTER — Ambulatory Visit (INDEPENDENT_AMBULATORY_CARE_PROVIDER_SITE_OTHER): Payer: Self-pay

## 2023-10-27 DIAGNOSIS — I5032 Chronic diastolic (congestive) heart failure: Secondary | ICD-10-CM | POA: Diagnosis not present

## 2023-10-28 ENCOUNTER — Encounter

## 2023-10-29 LAB — CUP PACEART REMOTE DEVICE CHECK
Battery Remaining Longevity: 72 mo
Battery Voltage: 2.99 V
Brady Statistic AP VP Percent: 1.79 %
Brady Statistic AP VS Percent: 0.08 %
Brady Statistic AS VP Percent: 96.86 %
Brady Statistic AS VS Percent: 1.27 %
Brady Statistic RA Percent Paced: 1.87 %
Brady Statistic RV Percent Paced: 6.8 %
Date Time Interrogation Session: 20250902033423
HighPow Impedance: 88 Ohm
Implantable Lead Connection Status: 753985
Implantable Lead Connection Status: 753985
Implantable Lead Connection Status: 753985
Implantable Lead Connection Status: 753985
Implantable Lead Implant Date: 20160421
Implantable Lead Implant Date: 20160421
Implantable Lead Implant Date: 20161006
Implantable Lead Implant Date: 20161006
Implantable Lead Location: 753858
Implantable Lead Location: 753858
Implantable Lead Location: 753859
Implantable Lead Location: 753860
Implantable Lead Model: 5071
Implantable Lead Model: 5071
Implantable Lead Model: 5076
Implantable Pulse Generator Implant Date: 20230831
Lead Channel Impedance Value: 323 Ohm
Lead Channel Impedance Value: 380 Ohm
Lead Channel Impedance Value: 4047 Ohm
Lead Channel Impedance Value: 4047 Ohm
Lead Channel Impedance Value: 627 Ohm
Lead Channel Impedance Value: 893 Ohm
Lead Channel Pacing Threshold Amplitude: 0.5 V
Lead Channel Pacing Threshold Amplitude: 0.875 V
Lead Channel Pacing Threshold Amplitude: 1.25 V
Lead Channel Pacing Threshold Pulse Width: 0.4 ms
Lead Channel Pacing Threshold Pulse Width: 0.4 ms
Lead Channel Pacing Threshold Pulse Width: 0.6 ms
Lead Channel Sensing Intrinsic Amplitude: 17.375 mV
Lead Channel Sensing Intrinsic Amplitude: 17.375 mV
Lead Channel Sensing Intrinsic Amplitude: 2.875 mV
Lead Channel Sensing Intrinsic Amplitude: 2.875 mV
Lead Channel Setting Pacing Amplitude: 1 V
Lead Channel Setting Pacing Amplitude: 1.5 V
Lead Channel Setting Pacing Amplitude: 2 V
Lead Channel Setting Pacing Pulse Width: 0.4 ms
Lead Channel Setting Pacing Pulse Width: 0.6 ms
Lead Channel Setting Sensing Sensitivity: 0.3 mV
Zone Setting Status: 755011
Zone Setting Status: 755011

## 2023-11-02 ENCOUNTER — Ambulatory Visit: Attending: Cardiology

## 2023-11-02 ENCOUNTER — Telehealth: Payer: Self-pay

## 2023-11-02 ENCOUNTER — Other Ambulatory Visit: Payer: Self-pay

## 2023-11-02 ENCOUNTER — Other Ambulatory Visit (HOSPITAL_COMMUNITY): Payer: Self-pay

## 2023-11-02 ENCOUNTER — Ambulatory Visit (HOSPITAL_BASED_OUTPATIENT_CLINIC_OR_DEPARTMENT_OTHER): Admitting: Cardiology

## 2023-11-02 ENCOUNTER — Other Ambulatory Visit
Admission: RE | Admit: 2023-11-02 | Discharge: 2023-11-02 | Disposition: A | Discharge status: Home / Self Care | Source: Ambulatory Visit | Attending: Cardiology | Admitting: Cardiology

## 2023-11-02 ENCOUNTER — Encounter: Payer: Self-pay | Admitting: Cardiology

## 2023-11-02 VITALS — BP 129/76 | HR 90 | Wt 170.2 lb

## 2023-11-02 DIAGNOSIS — R5382 Chronic fatigue, unspecified: Secondary | ICD-10-CM

## 2023-11-02 DIAGNOSIS — E663 Overweight: Secondary | ICD-10-CM | POA: Insufficient documentation

## 2023-11-02 DIAGNOSIS — I5042 Chronic combined systolic (congestive) and diastolic (congestive) heart failure: Secondary | ICD-10-CM | POA: Insufficient documentation

## 2023-11-02 DIAGNOSIS — I351 Nonrheumatic aortic (valve) insufficiency: Secondary | ICD-10-CM | POA: Diagnosis not present

## 2023-11-02 DIAGNOSIS — E782 Mixed hyperlipidemia: Secondary | ICD-10-CM | POA: Insufficient documentation

## 2023-11-02 DIAGNOSIS — Z95810 Presence of automatic (implantable) cardiac defibrillator: Secondary | ICD-10-CM | POA: Diagnosis not present

## 2023-11-02 LAB — BASIC METABOLIC PANEL WITH GFR
Anion gap: 9 (ref 5–15)
BUN: 32 mg/dL — ABNORMAL HIGH (ref 8–23)
CO2: 29 mmol/L (ref 22–32)
Calcium: 9.6 mg/dL (ref 8.9–10.3)
Chloride: 101 mmol/L (ref 98–111)
Creatinine, Ser: 1.28 mg/dL — ABNORMAL HIGH (ref 0.44–1.00)
GFR, Estimated: 47 mL/min — ABNORMAL LOW (ref >60)
Glucose, Bld: 68 mg/dL — ABNORMAL LOW (ref 70–99)
Potassium: 3.9 mmol/L (ref 3.5–5.1)
Sodium: 139 mmol/L (ref 135–145)

## 2023-11-02 LAB — LIPID PANEL
Cholesterol: 140 mg/dL (ref 0–200)
HDL: 69 mg/dL (ref >40)
LDL Cholesterol: 61 mg/dL (ref 0–99)
Total CHOL/HDL Ratio: 2 ratio
Triglycerides: 51 mg/dL (ref <150)
VLDL: 10 mg/dL (ref 0–40)

## 2023-11-02 LAB — HEMOGLOBIN A1C
Hgb A1c MFr Bld: 5.7 % — ABNORMAL HIGH (ref 4.8–5.6)
Mean Plasma Glucose: 116.89 mg/dL

## 2023-11-02 LAB — BRAIN NATRIURETIC PEPTIDE: B Natriuretic Peptide: 121.7 pg/mL — ABNORMAL HIGH (ref 0.0–100.0)

## 2023-11-02 MED ORDER — METOPROLOL SUCCINATE ER 25 MG PO TB24: 12.5000 mg | ORAL_TABLET | Freq: Every day | ORAL | 3 refills | Status: DC

## 2023-11-02 MED ORDER — SACUBITRIL-VALSARTAN 24-26 MG PO TABS: 1.0000 | ORAL_TABLET | Freq: Two times a day (BID) | ORAL | 1 refills | Status: AC

## 2023-11-02 MED ORDER — EMPAGLIFLOZIN 10 MG PO TABS: 10.0000 mg | ORAL_TABLET | Freq: Every day | ORAL | 1 refills | Status: AC

## 2023-11-02 NOTE — Telephone Encounter (Signed)
 Received call from main lab that pt bmet and A1c were contaminated and not able to result. Made provider aware. Called pt. Pt agreeable to getting blood work redrawn later this week. Orders placed. No further questions at this time.

## 2023-11-02 NOTE — Telephone Encounter (Signed)
 Advanced Heart Failure Patient Advocate Encounter  Test billing for this patient's current coverage (Rx [employer]) returns a $25 copay for 30 day supply of Entresto . This patient is eligible to use copay savings card to further reduce cost.  Farxiga  is not covered by this plan, and Jardiance  requires prior authorization.  This test claim was processed through [facility]- copay amounts may vary at other pharmacies due to pharmacy/plan contracts, or as the patient moves through the different stages of their insurance plan.  Rachel DEL, CPhT Rx Patient Advocate Phone: [phone]

## 2023-11-02 NOTE — Progress Notes (Signed)
 Medication Samples have been provided to the patient.  Drug name: Entresto        Strength: 24-26 MG        Qty: 2  LOT: [id number]  Exp.Date: Mar 26 2025  Dosing instructions: Take one tablet by mouth two times daily  The patient has been instructed regarding the correct time, dose, and frequency of taking this medication, including desired effects and most common side effects.    Medication Samples have been provided to the patient.  Drug name: Jardiance        Strength: 10 MG        Qty: 4  LOT: [id number]  Exp.Date: FEB 2027  Dosing instructions: Take one tablet by mouth daily   The patient has been instructed regarding the correct time, dose, and frequency of taking this medication, including desired effects and most common side effects.    Hartley Urton K Paislee Szatkowski 12:07 PM 11/02/2023

## 2023-11-02 NOTE — Patient Instructions (Addendum)
 Medication Changes:  START taking Entresto  24-26 MG two times daily. START taking Jardiance  10 MG once daily START taking Toprol  12.5 MG (half tablet) once daily at bedtime   Lab Work:  Labs done today, your results will be available in MyChart, we will contact you for abnormal readings.    Special Instructions // Education:  Do the following things EVERYDAY: Weigh yourself in the morning before breakfast. Write it down and keep it in a log. Take your medicines as prescribed Eat low salt foods--Limit salt (sodium) to 2000 mg per day.  Stay as active as you can everyday Limit all fluids for the day to less than 2 liters   Follow-Up in: With Pharmacist, Jaun Bash, in 2 weeks. 6 weeks with Dr. Gardenia    If you have any questions or concerns before your next appointment please send us  a message through [facility] or call our office at [phone], If it is after office hours your call will be answered by our answering service and directed appropriately.     At the [facility], you and your health needs are our priority. We have a designated team specialized in the treatment of Heart Failure. This Care Team includes your primary Heart Failure Specialized Cardiologist (physician), Advanced Practice Providers (APPs- Physician Assistants and Nurse Practitioners), and Pharmacist who all work together to provide you with the care you need, when you need it.   You may see any of the following providers on your designated Care Team at your next follow up:  Dr. Toribio Fuel Dr. Ezra Shuck Dr. Ria Gardenia Dr. Odis Brownie Greig Mosses, NP Caffie Shed, [place] [address] [place], [place] Beckey Coe, NP [place] Lee, NP Ellouise Class, NP Jaun Bash, PharmD

## 2023-11-02 NOTE — Telephone Encounter (Signed)
 Advanced Heart Failure Patient Advocate Encounter  Prior authorization for Jardiance  has been submitted and approved. Test billing returns $121.90 for 30 day supply. This plan limits 30 day refill, and this patient is eligible to use copay savings card that would reduce cost to $10.  Key: [id number] Effective: 11/02/2023 to 11/01/2024  Rachel DEL, CPhT Rx Patient Advocate Phone: [phone]

## 2023-11-02 NOTE — Progress Notes (Signed)
   ADVANCED HEART FAILURE CLINIC NOTE  Referring Physician: Gerard Frederick, NP  Primary Care: Marylynn Verneita CROME, MD Primary Cardiologist: Evalene Lunger, MD Heart Failure: Ria Commander, DO  CC: Chronic systolic heart failure  HPI: Suzanne Spears is a 63 y.o. female with HFrEF 2/2 NICM s/p CRT-D, LBBB, hx of PE, CKD III, HTN presenting to[facility]y to establish care.   Pertinent Family & social hx: No tobacco use.   Cardiac History:  - Nonischemic / viral cardiomyopathy diagnosed in 2016 - CRTD Implanted in 2016>lead dislodgement requiring epicardial lead placement complicated by post-op PE.  - 10/19/23: Drop in LVEF to 30-35%, AHF consulted outpatient. Lost insurance coverage prior to this requiring transition to generic meds & ARBs.    Interval hx:  - Since transition to valsartan  from Entresto  she feels increasingly fatigued. She becomes slightly fatigued and dyspneic after trips to the grocery store. She also feels that it takes longer to recover from activity now. No recent hospitalizations. Compliant with all medications.    PHYSICAL EXAM: Vitals:   11/02/23 1038  BP: 129/76  Pulse: 90  SpO2: 98%   Lungs- CTA CARDIAC:  JVP: 6 cm          Normal rate with regular rhythm. no murmur.  Pulses 2+. No edema.  ABDOMEN: soft EXTREMITIES: Warm and well perfused.   DATA REVIEW  ECG: 09/04/23: Sinus, QRSd  as per my personal interpretation  ECHO: 02/06/2017: LVEF 50-55% 06/27/21: LVEF 40-45%, normal RV function  10/19/23: LVEF 30-35%  CPX: 02/22/2016: submax effort; markedly elevated VE/VCO2 due to hyperventilation; mild-moderate cardiac limitation.     ASSESSMENT & PLAN:  Heart Failure with reduced fraction - Etiology & History: Nonischemic cardiomyopathy - NYHA Class: NYHA IIB - Volume status: lasix  20mg  daily  - GDMT:  -  RAASi: D/C Valsartan ; start Entresto  24/26mg  BID Beta-blocker: will try toprol  12.5mg  at bedtime.  -  Spironolactone  25mg  daily  -  SGLT2i:  Now off farxiga  due to loss of insurance. Jardiance  samples provided today. Will re-prescribe.   - ICD: CRT-D implanted 12/16, lead dislodgement requiring epicardial lead placement. Reviewed device interrogation from 10/27/23; 98.5% LV pacing. EKG from 09/04/23 reviewed, QRSd of .  - Advanced therapies: Not indicated.  - Repeat BMP/BNP/Lipid profile today.    2. Aortic regurgitation  - Reviewed TTE from 10/19/23; appears to have moderate AI.  - Will adjust GDMT, re-evaluate on repeat TTE in 8 weeks.   3. Hyperlipidemia  - Continue lipitor   Jerzy Crotteau Advanced Heart Failure Mechanical Circulatory Support  I spent 60 minutes caring for this patient today including face to face time, ordering and reviewing labs, reviewing records/labs noted above, discussing case with pharmacy (insurance coverage, etc), reviewing echo from 10/19/23 with patient, seeing the patient, documenting in the record, and arranging follow ups.

## 2023-11-03 ENCOUNTER — Telehealth: Payer: Self-pay

## 2023-11-03 NOTE — Telephone Encounter (Signed)
 Pt called and stated that she was at the lab and they stated that she didn't need lab work redrawn. Had Sabharwal, MD review labs. Advised pt that she did not need labs redrawn per MD.

## 2023-11-03 NOTE — Progress Notes (Signed)
Remote ICD Transmission.

## 2023-11-04 NOTE — Progress Notes (Signed)
 EPIC Encounter for ICM Monitoring  Patient Name: Suzanne Spears is a 63 y.o. female Date: 11/04/2023 Primary Care Physican: Marylynn Verneita CROME, MD  Primary Cardiologist: [facility] Elect[facility]iologist: Kennyth Pore Pacing:  98.5%    10/29/2022 Weight: 170 lbs 03/27/2023 Weight: 174 lbs 08/12/2023 Weight: 170.2 lbs 11/02/2023 Weight: 170 lbs   Clinical Status Since 27-Oct-2023 Time in AT/AF 0.0 hr/day (0.0%)          Transmission results reviewed.     Diet: Does not adhere to low salt diet   OptiVol Thoracic impedance suggesting intermittent days with possible fluid accumulation from 8/8 - 8/26.   Prescribed:  Furosemide  20 mg Take1 tablet (20 mg) by mouth as directed. Take 1 tablet daily. May take extra 20 mg as needed for weight gain or swelling.   Spironolactone  25 mg take 1 tablet daily   Labs: 11/02/2023 Creatinine 1.28, BUN 32, Potassium 3.9, Sodium 139, GFR 47 05/18/2023 Creatinine 1.34, BUN 24, Potassium 3.9, Sodium 139, GFR 43.66 12/08/2022 Creatinine 1.20, BUN 22, Potassium 3.6, Sodium 138, GFR 52 A complete set of results can be found in Results Review.   Recommendations:  No changes.    Follow-up plan: ICM clinic phone appointment on 11/02/2023.  91 day device clinic remote transmission 10/27/2023.     EP/Cardiology Office Visits:  Recall 01/29/2024 with Dr Fernande.  12/07/2023 Maeola Lunch, NP.   Copy of ICM check sent to Dr. Kennyth.    3 month ICM trend: 11/02/2023.    12-14 Month ICM trend:     Mitzie GORMAN Garner, RN 11/04/2023 1:44 PM

## 2023-11-05 ENCOUNTER — Telehealth: Payer: Self-pay | Admitting: Pharmacist

## 2023-11-05 NOTE — Telephone Encounter (Signed)
 Patient called in regard to confusion surrounding transition from valsartan  to Entresto . Questions answered and patient is no longer taking her valsartan  tablet.

## 2023-11-12 ENCOUNTER — Telehealth: Payer: Self-pay

## 2023-11-12 NOTE — Telephone Encounter (Signed)
 Pt called stating that her bp has been running low. Today's bp is 97/71. Per Ellouise Class, FNP reduce spiro to 12.5mg . pt agreeable. Advise to call us  back if bp not improve.

## 2023-11-13 NOTE — Progress Notes (Addendum)
 Advanced Heart Failure Clinic Note  PCP: Gardenia Led, DO PCP-Cardiologist: Evalene Lunger, MD HF-Cardiologist: Led Gardenia, DO  HPI:  Suzanne Spears is a 63 y.o. female with HFrEF 2/2 NICM s/p CRT-D, LBBB, hx of PE, CKD III, HTN.    Cardiac History:  - Nonischemic / viral cardiomyopathy diagnosed in 2016 - CRTD Implanted in 2016 > lead dislodgement requiring epicardial lead placement complicated by post-op PE.  - 10/19/23: Drop in LVEF to 30-35%, AHF consulted outpatient. Lost insurance coverage prior to this requiring transition to generic meds.    Interval hx:  - Last seen by Dr. Gardenia 11/02/23 where valsartan  was transitioned to Entresto  and Jardiance  was restarted. Patient called several days later for hypotension and spironolactone  was decreased to 12.5 mg daily with improvement.  Today Suzanne Spears returns to [facility] for pharmacist medication titration. Reports feeling fine aside from lethargy. Lethargy has worsened since last visit. Reports fatigue. Denies dizziness lightheadedness chest pain palpitations SOB LEE orthopnea orthostasis PND. Reports being able to complete all activities of daily living (ADLs). Is moderately active throughout the day. Weight at home is ~170 pounds. Takes furosemide  20 mg daily. Appetite has noticeably improved. Previously at twice daily, but does has 3 meals. Weight slightly up, patient reports due to increased appetite. Follows 2L fluid restrictions. Recent hypotension at home. Somewhat follows a low sodium diet.  Current Heart Failure Medications: Loop diuretic: furosemide  20 mg daily Beta-Blocker: metoprolol  succinate 12.5 mg at bedtime ACEI/ARB/ARNI: Entresto  24-26 mg BID MRA: spironolactone  12.5 mg daily SGLT2i: Jardiance  10 mg daily Other: none  Has the patient been experiencing any side effects to the medications prescribed? Yes. Reports increasing lethargy since starting spironolactone , Jardiance , and  metoprolol .  Does the patient have any problems obtaining medications due to transportation or finances? No  Understanding of regimen: Excellent  Understanding of indications: Excellent  Potential of adherence: Excellent  Patient understands to avoid NSAIDs.  Patient understands to avoid decongestants.  Pertinent Lab Values: Creat  Date Value Ref Range Status  08/17/2015 1.35 (H) 0.50 - 1.05 mg/dL Final    Comment:      For patients > or = 62 years of age: The upper reference limit for Creatinine is approximately 13% higher for people identified as African-American.      Creatinine, Ser  Date Value Ref Range Status  11/02/2023 1.28 (H) 0.44 - 1.00 mg/dL Final   BUN  Date Value Ref Range Status  11/02/2023 32 (H) 8 - 23 mg/dL Final  94/75/7977 21 6 - 24 mg/dL Final  95/81/7983 15 mg/dL Final    Comment:    3-79 NOTE: New Reference Range  05/02/14    Potassium  Date Value Ref Range Status  11/02/2023 3.9 3.5 - 5.1 mmol/L Final  06/12/2014 4.0 mmol/L Final    Comment:    3.5-5.1 NOTE: New Reference Range  05/02/14    Sodium  Date Value Ref Range Status  11/02/2023 139 135 - 145 mmol/L Final  07/17/2020 141 134 - 144 mmol/L Final  06/12/2014 140 mmol/L Final    Comment:    135-145 NOTE: New Reference Range  05/02/14    B Natriuretic Peptide  Date Value Ref Range Status  11/02/2023 121.7 (H) 0.0 - 100.0 pg/mL Final    Comment:    Performed at [facility], [address], [place], [place] [address]   Magnesium   Date Value Ref Range Status  05/02/2020 1.8 1.7 - 2.4 mg/dL Final  Comment:    Performed at [facility], [address] [place], [place] [address]   TSH  Date Value Ref Range Status  05/18/2023 0.55 0.35 - 5.50 uIU/mL Final    Vital Signs: Today's Vitals   11/18/23 1329  BP: 90/66  Pulse: 78  SpO2: 96%  Weight: 173 lb 12.8 oz (78.8 kg)   Assessment/Plan: Heart Failure with reduced fraction - Etiology &  History: Nonischemic cardiomyopathy - NYHA Class: NYHA II.  - Volume status: Appears euvolemic. REDS 28%. Patient experiencing progressive lethargy and lower BPs. No LEE or JVD. Creatinine and BUN are up. Decrease Lasix  to 20mg  as needed. Suspect new addition of GDMT has led to mild hypovolemia. - GDMT:   RAASi: Continue Entresto  24/26mg  BID. BP is soft today and patient is lethargic. Will continue for now, but may need to reduce if symptomatic hypotension continues after diuretic adjustment.  Beta-blocker: Continue toprol  12.5mg  at bedtime. If lethargy does not improve after holding furosemide  x 2 days, may require holding given lethargy followed close after initiation.  MRA: Continue spironolactone  12.5mg  daily. Hopefully will be able to increase after furosemide  reduction.  SGLT2i: Continue Jardiance  10 mg daily - ICD: CRT-D implanted 12/16, lead dislodgement requiring epicardial lead placement. Reviewed device interrogation from 10/27/23; 98.5% LV pacing. EKG from 09/04/23 reviewed, QRSd of .  - Advanced therapies: Not indicated.  - Repeat BMP. Little utility to BNP with Entresto  start    2. Aortic regurgitation  - Reviewed TTE from 10/19/23; appears to have moderate AI.  - Will adjust GDMT, re-evaluate on repeat TTE in 6 weeks.    3. Hyperlipidemia  - Continue Lipitor and Zetia   Follow up: with pharmacist via telephone Monday and with MD in 1 month  Please do not hesitate to reach out with questions or concerns,  Jaun Bash, PharmD, CPP, BCPS, [facility] Heart Failure Pharmacist  Phone - [phone] 11/18/2023 2:43 PM

## 2023-11-17 ENCOUNTER — Other Ambulatory Visit

## 2023-11-17 ENCOUNTER — Telehealth: Payer: Self-pay | Admitting: Pharmacist

## 2023-11-17 NOTE — Telephone Encounter (Signed)
 Called to confirm/remind patient of their appointment at the Advanced Heart Failure Clinic on 11/18/23.   Appointment:   [x] Confirmed  [] Left mess   [] No answer/No voice mail  [] VM Full/unable to leave message  [] Phone not in service  Patient reminded to bring all medications and/or complete list.  Confirmed patient has transportation. Gave directions, instructed to utilize valet parking.

## 2023-11-18 ENCOUNTER — Ambulatory Visit (HOSPITAL_BASED_OUTPATIENT_CLINIC_OR_DEPARTMENT_OTHER): Admitting: Pharmacist

## 2023-11-18 ENCOUNTER — Other Ambulatory Visit
Admission: RE | Admit: 2023-11-18 | Discharge: 2023-11-18 | Disposition: A | Discharge status: Home / Self Care | Source: Ambulatory Visit | Attending: Cardiology | Admitting: Cardiology

## 2023-11-18 VITALS — BP 90/66 | HR 78 | Wt 173.8 lb

## 2023-11-18 DIAGNOSIS — I5022 Chronic systolic (congestive) heart failure: Secondary | ICD-10-CM | POA: Diagnosis not present

## 2023-11-18 DIAGNOSIS — I5042 Chronic combined systolic (congestive) and diastolic (congestive) heart failure: Secondary | ICD-10-CM

## 2023-11-18 DIAGNOSIS — I5032 Chronic diastolic (congestive) heart failure: Secondary | ICD-10-CM

## 2023-11-18 DIAGNOSIS — H43811 Vitreous degeneration, right eye: Secondary | ICD-10-CM | POA: Diagnosis not present

## 2023-11-18 LAB — BASIC METABOLIC PANEL WITH GFR
Anion gap: 9 (ref 5–15)
BUN: 29 mg/dL — ABNORMAL HIGH (ref 8–23)
CO2: 28 mmol/L (ref 22–32)
Calcium: 9.1 mg/dL (ref 8.9–10.3)
Chloride: 102 mmol/L (ref 98–111)
Creatinine, Ser: 1.47 mg/dL — ABNORMAL HIGH (ref 0.44–1.00)
GFR, Estimated: 40 mL/min — ABNORMAL LOW (ref >60)
Glucose, Bld: 78 mg/dL (ref 70–99)
Potassium: 4.1 mmol/L (ref 3.5–5.1)
Sodium: 139 mmol/L (ref 135–145)

## 2023-11-18 MED ORDER — FUROSEMIDE 20 MG PO TABS: 20.0000 mg | ORAL_TABLET | Freq: Every day | ORAL | 3 refills | Status: AC | PRN

## 2023-11-18 NOTE — Patient Instructions (Signed)
 It was a pleasure seeing you today!  MEDICATIONS: -Change your furosemide  to taking as needed for weight gain of 2-3 lbs and symptoms of swelling or shortness of breath -You can try to hold metoprolol  for 2-3 days to see if this makes you feel more energetic. Resume metoprolol  after 3 days and reach out to Klaryssa Fauth afterwards to discuss. -Call if you have questions about your medications.  LABS: -We will call you if your labs need attention.  NEXT APPOINTMENT: Return to clinic in 1 month to see Dr. Gardenia.  In general, to take care of your heart failure: -Limit your fluid intake to 2 Liters (half-gallon) per day.   -Limit your salt intake to ideally 2-3 grams (2000-3000 mg) per day. -Weigh yourself daily and record, and bring that weight diary to your next appointment.  (Weight gain of 2-3 pounds in 1 day typically means fluid weight.) -The medications for your heart are to help your heart and help you live longer.   -Please contact us  before stopping any of your heart medications.  Call the clinic at [phone] with questions or to reschedule future appointments.

## 2023-11-23 ENCOUNTER — Telehealth: Payer: Self-pay | Admitting: Pharmacist

## 2023-11-23 MED ORDER — SPIRONOLACTONE 25 MG PO TABS: 25.0000 mg | ORAL_TABLET | Freq: Every day | ORAL | 3 refills | Status: DC

## 2023-11-23 NOTE — Telephone Encounter (Signed)
 Patient reports feeling much better after changing furosemide  to prn. She has taken it once, this AM since making the change. Since she is feeling better, will increase spironolactone  back to a whole tablet daily and repeat labs in 1 week. Stop potassium supplement.

## 2023-11-30 ENCOUNTER — Telehealth: Payer: Self-pay | Admitting: Pharmacist

## 2023-11-30 NOTE — Telephone Encounter (Signed)
 Called to confirm/remind patient of their appointment at the Advanced Heart Failure Clinic on 12/01/23.   Appointment:   [x] Confirmed  [] Left mess   [] No answer/No voice mail  [] VM Full/unable to leave message  [] Phone not in service  Patient reminded to bring all medications and/or complete list.  Confirmed patient has transportation. Gave directions, instructed to utilize valet parking.

## 2023-11-30 NOTE — Progress Notes (Unsigned)
 Advanced Heart Failure Clinic Note  PCP: Gardenia Led, DO PCP-Cardiologist: Evalene Lunger, MD HF-Cardiologist: Led Gardenia, DO  HPI:  Suzanne Spears is a 63 y.o. female with HFrEF 2/2 NICM s/p CRT-D, LBBB, hx of PE, CKD III, HTN.    Cardiac History:  - Nonischemic / viral cardiomyopathy diagnosed in 2016 - CRTD Implanted in 2016 > lead dislodgement requiring epicardial lead placement complicated by post-op PE.  - 10/19/23: Drop in LVEF to 30-35%, AHF consulted outpatient. Lost insurance coverage prior to this requiring transition to generic meds.    Interval hx:  - Last seen by Dr. Gardenia 11/02/23 where valsartan  was transitioned to Entresto  and Jardiance  was restarted. Patient called several days later for hypotension and spironolactone  was decreased to 12.5 mg daily with improvement.  Today Suzanne Spears returns to Heart Failure Clinic for pharmacist medication titration. Reports feeling great today. Lethargy has improved significantly since reducing furosemide  to prn with less dizziness as well. Reports fatigue. Denies dizziness lightheadedness chest pain palpitations SOB LEE orthopnea orthostasis PND. Reports being able to complete all activities of daily living (ADLs). Is moderately active throughout the day. Weight at home is ~170 pounds. Takes furosemide  20 mg prn, requires ~1 time weekly. Appetite is good. Weight down from last visit. Follows 2L fluid restrictions.  Somewhat follows a low sodium diet. BP at home was 100-119/60-80 mmHg  over the past week with one asymptomatic SBP of 96 mmHg. Patient did not increase spironolactone  as discussed via telephone, but did stop potassium supplement.   Current Heart Failure Medications: Loop diuretic: furosemide  20 mg daily Beta-Blocker: metoprolol  succinate 12.5 mg at bedtime ACEI/ARB/ARNI: Entresto  24-26 mg BID MRA: spironolactone  12.5 mg daily SGLT2i: Jardiance  10 mg daily Other: none  Has the patient been  experiencing any side effects to the medications prescribed? Yes. Reports increasing lethargy since starting spironolactone , Jardiance , and metoprolol .  Does the patient have any problems obtaining medications due to transportation or finances? No  Understanding of regimen: Excellent  Understanding of indications: Excellent  Potential of adherence: Excellent  Patient understands to avoid NSAIDs.  Patient understands to avoid decongestants.  Pertinent Lab Values: Creat  Date Value Ref Range Status  08/17/2015 1.35 (H) 0.50 - 1.05 mg/dL Final    Comment:      For patients > or = 62 years of age: The upper reference limit for Creatinine is approximately 13% higher for people identified as African-American.      Creatinine, Ser  Date Value Ref Range Status  11/18/2023 1.47 (H) 0.44 - 1.00 mg/dL Final   BUN  Date Value Ref Range Status  11/18/2023 29 (H) 8 - 23 mg/dL Final  94/75/7977 21 6 - 24 mg/dL Final  95/81/7983 15 mg/dL Final    Comment:    3-79 NOTE: New Reference Range  05/02/14    Potassium  Date Value Ref Range Status  11/18/2023 4.1 3.5 - 5.1 mmol/L Final  06/12/2014 4.0 mmol/L Final    Comment:    3.5-5.1 NOTE: New Reference Range  05/02/14    Sodium  Date Value Ref Range Status  11/18/2023 139 135 - 145 mmol/L Final  07/17/2020 141 134 - 144 mmol/L Final  06/12/2014 140 mmol/L Final    Comment:    135-145 NOTE: New Reference Range  05/02/14    B Natriuretic Peptide  Date Value Ref Range Status  11/02/2023 121.7 (H) 0.0 - 100.0 pg/mL Final    Comment:    Performed at [facility], [address], [place], [place] [address]   Magnesium   Date Value Ref Range Status  05/02/2020 1.8 1.7 - 2.4 mg/dL Final    Comment:    Performed at [facility], [address] [place], [place] [address]   TSH  Date Value Ref Range Status  05/18/2023 0.55 0.35 - 5.50 uIU/mL Final    Vital Signs: Today's Vitals   12/01/23 1146   Weight: 170 lb 3.2 oz (77.2 kg)    Assessment/Plan: Heart Failure with reduced fraction - Etiology & History: Nonischemic cardiomyopathy - NYHA Class: NYHA II.  - Volume status: Appears euvolemic. Lethargy improved and BPs at home are normal with no symptoms. No LEE or JVD. Continue Lasix  20mg  as needed.  - GDMT:   RAASi: Continue Entresto  24/26mg  BID.    Beta-blocker: Continue toprol  12.5mg  at bedtime.    MRA: Increase spironolactone  to 25 mg daily as previously instructed. BMET in 1 week.  SGLT2i: Continue Jardiance  10 mg daily - ICD: CRT-D implanted 01/2015, lead dislodgement requiring epicardial lead placement. Reviewed device interrogation from 10/27/23; 98.5% LV pacing. EKG from 09/04/23 reviewed, QRSd of .  - Advanced therapies: Not indicated.  - Repeat BMP. Little utility to BNP with Entresto  start    2. Aortic regurgitation  - Reviewed TTE from 10/19/23; appears to have moderate AI.  - Will adjust GDMT, re-evaluate on repeat TTE in 6 weeks.    3. Hyperlipidemia  - Continue Lipitor and Zetia   Follow up: with MD in 2 weeks  Please do not hesitate to reach out with questions or concerns,  Jaun Bash, PharmD, CPP, BCPS, [facility] Heart Failure Pharmacist  Phone - [phone] 12/01/2023 11:53 AM

## 2023-12-01 ENCOUNTER — Ambulatory Visit: Attending: Family | Admitting: Pharmacist

## 2023-12-01 VITALS — BP 102/72 | HR 73 | Wt 170.2 lb

## 2023-12-01 DIAGNOSIS — E785 Hyperlipidemia, unspecified: Secondary | ICD-10-CM | POA: Diagnosis not present

## 2023-12-01 DIAGNOSIS — I351 Nonrheumatic aortic (valve) insufficiency: Secondary | ICD-10-CM | POA: Insufficient documentation

## 2023-12-01 DIAGNOSIS — I5042 Chronic combined systolic (congestive) and diastolic (congestive) heart failure: Secondary | ICD-10-CM

## 2023-12-01 DIAGNOSIS — Z79899 Other long term (current) drug therapy: Secondary | ICD-10-CM | POA: Insufficient documentation

## 2023-12-01 DIAGNOSIS — I428 Other cardiomyopathies: Secondary | ICD-10-CM | POA: Insufficient documentation

## 2023-12-01 DIAGNOSIS — I130 Hypertensive heart and chronic kidney disease with heart failure and stage 1 through stage 4 chronic kidney disease, or unspecified chronic kidney disease: Secondary | ICD-10-CM | POA: Insufficient documentation

## 2023-12-01 DIAGNOSIS — Z86711 Personal history of pulmonary embolism: Secondary | ICD-10-CM | POA: Diagnosis not present

## 2023-12-01 DIAGNOSIS — I5020 Unspecified systolic (congestive) heart failure: Secondary | ICD-10-CM | POA: Diagnosis not present

## 2023-12-01 DIAGNOSIS — Z95810 Presence of automatic (implantable) cardiac defibrillator: Secondary | ICD-10-CM | POA: Diagnosis not present

## 2023-12-01 DIAGNOSIS — I447 Left bundle-branch block, unspecified: Secondary | ICD-10-CM | POA: Diagnosis not present

## 2023-12-01 DIAGNOSIS — N183 Chronic kidney disease, stage 3 unspecified: Secondary | ICD-10-CM | POA: Diagnosis not present

## 2023-12-01 MED ORDER — SPIRONOLACTONE 25 MG PO TABS: 25.0000 mg | ORAL_TABLET | Freq: Every day | ORAL | 3 refills | Status: DC

## 2023-12-01 NOTE — Patient Instructions (Signed)
 It was a pleasure seeing you today!  MEDICATIONS: -Increase spironolactone  to a whole tablet (25 mg) daily -Call if you have questions about your medications.  LABS: -We will call you if your labs need attention.  NEXT APPOINTMENT: Return to clinic in ~2 weeks to see Dr. Gardenia.  In general, to take care of your heart failure: -Limit your fluid intake to 2 Liters (half-gallon) per day.   -Limit your salt intake to ideally 2-3 grams (2000-3000 mg) per day. -Weigh yourself daily and record, and bring that weight diary to your next appointment.  (Weight gain of 2-3 pounds in 1 day typically means fluid weight.) -The medications for your heart are to help your heart and help you live longer.   -Please contact us  before stopping any of your heart medications.  Call the clinic at [phone] with questions or to reschedule future appointments.

## 2023-12-03 ENCOUNTER — Encounter: Payer: Self-pay | Admitting: Internal Medicine

## 2023-12-07 ENCOUNTER — Ambulatory Visit: Admitting: Cardiology

## 2023-12-07 NOTE — Progress Notes (Deleted)
 Cardiology Office Note   Date:  12/07/2023  ID:  Suzanne Spears, DOB [birth year], MRN [id number] PCP: Gardenia Led, DO  [facility] Providers Cardiologist:  Evalene Lunger, MD Cardiology APP:  Gerard Frederick, NP  Electrophysiologist:  Elspeth Sage, MD { Click to update primary MD,subspecialty MD or APP then REFRESH:1}    History of Present Illness Suzanne Spears is a 63 y.o. female with past medical history of nonischemic cardiomyopathy status post CRT-T-ICD, HFmrEF, NICM, beta-blocker intolerance, chronic left bundle branch block, PE (2016), CKD stage III, mitral regurgitation/MVP, hypertension, depression, chronic neck pain, GERD, presents today for follow-up.   She previously underwent diagnostic catheterization in 12/2004, which showed normal coronary arteries.  Echocardiogram in 2002 showed an EF of 35%.  She was previously followed by [facility] clinic.  In April 2016 in the setting of LV dysfunction a left bundle branch block she underwent CRT-D.  Unfortunately this was complicated by lead dislodgment requiring  placement of an epicardial lead.  Postoperative course was complicated by PE.  CPX testing in December 2017 showed at least mild to moderate circulatory limitation and submaximal effort.  Chest CT in 2016 did not show any significant coronary calcification or aortic atherosclerosis.  Echocardiogram in 2021 revealed LVEF of 40-45%.  Repeat echocardiogram done in 06/2021 revealed an LVEF still of 40-45%.  August 2023 she required an ICD generator change.  Seen in clinic 05/04/2023 by Dr. Gollan.  Had recently retired insurance change she was unable to afford Entresto  Farxiga .  Previous port was placed for patient assistance.  She was seen in clinic on 09/04/2023 with concerns of leg pain and a rash that she noted on her legs and her right clavicle.  Stated she had had left leg pain and a rash.  Occasional swelling when she tries to wear compression stockings in the summer  but it is extremely hot.  Denied any other associated symptoms.  Was treated for cellulitis to the lower extremities.   She was last seen in clinic 11/02/2018 follow-up by Dr. Sabol from advanced heart failure.  She was sent for repeat labs of a BMP BNP and lipid panel.  She was given Jardiance  samples and Jardiance  was prescribed.  She was started on Entresto  24/26 mg twice daily with valsartan  discontinued.  She returns to clinic today  ROS: 10 point review of systems has been reviewed and considered negative the exception was been listed in the HPI  Studies Reviewed      2D echo 06/27/2021 1. Left ventricular ejection fraction, by estimation, is 40 to 45%. Left  ventricular ejection fraction by 2D MOD biplane is 42.3 %. The left  ventricle has mild to moderately decreased function. The left ventricle  demonstrates global hypokinesis. Left  ventricular diastolic parameters are consistent with Grade II diastolic  dysfunction (pseudonormalization). The average left ventricular global  longitudinal strain is -14.2 %. The global longitudinal strain is  abnormal.   2. Right ventricular systolic function is normal. The right ventricular  size is normal.   3. The mitral valve is degenerative. Mild mitral valve regurgitation.  Moderate mitral annular calcification.   4. The aortic valve is tricuspid. Aortic valve regurgitation is mild to  moderate.   5. The inferior vena cava is normal in size with greater than 50%  respiratory variability, suggesting right atrial pressure of 3 mmHg.    2D echo 08/18/2019 1. Left ventricular ejection fraction, by estimation, is 40 to 45%. The  left ventricle has mildly decreased  function. The left ventricle  demonstrates global hypokinesis. Left ventricular diastolic parameters are  consistent with Grade I diastolic  dysfunction (impaired relaxation).   2. Right ventricular systolic function is normal. The right ventricular  size is normal. There is normal  pulmonary artery systolic pressure.   3. The mitral valve is normal in structure. Trivial mitral valve  regurgitation. No evidence of mitral stenosis.   4. The aortic valve is normal in structure. Aortic valve regurgitation is  moderate. Mild aortic valve sclerosis is present, with no evidence of  aortic valve stenosis.   5. The inferior vena cava is normal in size with greater than 50%  respiratory variability, suggesting right atrial pressure of 3 mmHg.    Risk Assessment/Calculations   No BP recorded.  {Refresh Note OR Click here to enter BP  :1}***       Physical Exam VS:  LMP 07/12/2016        Wt Readings from Last 3 Encounters:  12/01/23 170 lb 3.2 oz (77.2 kg)  11/18/23 173 lb 12.8 oz (78.8 kg)  11/02/23 170 lb 4 oz (77.2 kg)    GEN: Well nourished, well developed in no acute distress NECK: No JVD; No carotid bruits CARDIAC: ***RRR, no murmurs, rubs, gallops RESPIRATORY:  Clear to auscultation without rales, wheezing or rhonchi  ABDOMEN: Soft, non-tender, non-distended EXTREMITIES:  No edema; No deformity   ASSESSMENT AND PLAN Combined systolic and diastolic heart failure/nonischemic cardiomyopathy Peripheral edema varicose veins of bilateral lower extremity Cardiac device status post ICD in situ Mixed hyperlipidemia    {Are you ordering a CV Procedure (e.g. stress test, cath, DCCV, TEE, etc)?   Press F2        :[id number]}  Dispo: ***  Signed, Monte Bronder, NP

## 2023-12-08 ENCOUNTER — Ambulatory Visit: Attending: Cardiology | Admitting: Cardiology

## 2023-12-08 ENCOUNTER — Encounter: Payer: Self-pay | Admitting: Cardiology

## 2023-12-08 VITALS — BP 114/60 | HR 73 | Ht 65.0 in | Wt 171.3 lb

## 2023-12-08 DIAGNOSIS — I428 Other cardiomyopathies: Secondary | ICD-10-CM | POA: Diagnosis not present

## 2023-12-08 DIAGNOSIS — I5042 Chronic combined systolic (congestive) and diastolic (congestive) heart failure: Secondary | ICD-10-CM | POA: Diagnosis not present

## 2023-12-08 DIAGNOSIS — E782 Mixed hyperlipidemia: Secondary | ICD-10-CM | POA: Diagnosis not present

## 2023-12-08 DIAGNOSIS — Z95810 Presence of automatic (implantable) cardiac defibrillator: Secondary | ICD-10-CM | POA: Diagnosis not present

## 2023-12-08 DIAGNOSIS — R600 Localized edema: Secondary | ICD-10-CM | POA: Diagnosis not present

## 2023-12-08 DIAGNOSIS — I5020 Unspecified systolic (congestive) heart failure: Secondary | ICD-10-CM | POA: Diagnosis not present

## 2023-12-08 NOTE — Progress Notes (Signed)
 Cardiology Office Note   Date:  12/08/2023  ID:  Suzanne Spears, DOB 12/17/1960, MRN [id number] PCP: Gardenia Led, DO  [facility] Providers Cardiologist:  Evalene Lunger, MD Cardiology APP:  Gerard Frederick, NP  Electrophysiologist:  Elspeth Sage, MD     History of Present Illness Suzanne Spears is a 63 y.o. female with past medical history of nonischemic cardiomyopathy status post CRT-T-ICD, HFmrEF, NICM, beta-blocker intolerance, chronic left bundle branch block, PE (2016), CKD stage III, mitral regurgitation/MVP, hypertension, depression, chronic neck pain, GERD, presents today for follow-up.   She previously underwent diagnostic catheterization in 12/2004, which showed normal coronary arteries.  Echocardiogram in 2002 showed an EF of 35%.  She was previously followed by [facility].  In April 2016 in the setting of LV dysfunction a left bundle branch block she underwent CRT-D.  Unfortunately this was complicated by lead dislodgment requiring  placement of an epicardial lead.  Postoperative course was complicated by PE.  CPX testing in December 2017 showed at least mild to moderate circulatory limitation and submaximal effort.  Chest CT in 2016 did not show any significant coronary calcification or aortic atherosclerosis.  Echocardiogram in 2021 revealed LVEF of 40-45%.  Repeat echocardiogram done in 06/2021 revealed an LVEF still of 40-45%.  August 2023 she required an ICD generator change.  Seen in clinic 05/04/2023 by Dr. Gollan.  Had recently retired insurance change she was unable to afford Entresto  Farxiga .  Previous port was placed for patient assistance.  She was seen in clinic on 09/04/2023 with concerns of leg pain and a rash that she noted on her legs and her right clavicle.  Stated she had had left leg pain and a rash.  Occasional swelling when she tries to wear compression stockings in the summer but it is extremely hot.  Denied any other associated symptoms.   Was treated for cellulitis to the lower extremities.   She was last seen in clinic 11/02/2018 follow-up by Dr. Sabol from advanced heart failure.  She was sent for repeat labs of a BMP BNP and lipid panel.  She was given Jardiance  samples and Jardiance  was prescribed.  She was started on Entresto  24/26 mg twice daily with valsartan  discontinued.  She returns to clinic today with complaints of extreme fatigue and overall just being tired.  She states that she has been compliant with her current medication regimen without any undue side effects.  States that she did follow-up with advanced heart failure and pharmacy.  Occasional swelling which she just wear compression stockings.  Denies any chest pain, palpitations, lightheadedness or dizziness.  She is becoming discouraged as she has several events upcoming and then because 1 is her son's wedding that she has now will not be overly fatigued and tired and wants to enjoy the festivities.  She denies any hospitalizations or visits to the emergency department.  ROS: 10 point review of systems has been reviewed and considered negative the exception was been listed in the HPI  Studies Reviewed EKG Interpretation Date/Time:  Tuesday December 08 2023 11:03:33 EDT Ventricular Rate:  73 PR Interval:  100 QRS Duration:  110 QT Interval:  442 QTC Calculation: 486 R Axis:   -74  Text Interpretation: Atrial-sensed ventricular-paced rhythm When compared with ECG of 04-Sep-2023 08:50, No significant change since last tracing Confirmed by Gerard Frederick ([id number]) on 12/08/2023 11:04:33 AM    2d echo 10/19/2023 1. Left ventricular ejection fraction, by estimation, is 30 to 35%. The  left ventricle  has moderate to severely decreased function. The left  ventricle demonstrates global hypokinesis. There is mild left ventricular  hypertrophy. Left ventricular  diastolic parameters are consistent with Grade I diastolic dysfunction  (impaired relaxation). The average  left ventricular global longitudinal  strain is -10.5 %.   2. Right ventricular systolic function is normal. The right ventricular  size is normal.   3. The mitral valve is normal in structure. Mild mitral valve  regurgitation.   4. The aortic valve is tricuspid. Aortic valve regurgitation is mild to  moderate. Mild aortic valve stenosis. Aortic valve mean gradient measures  11.0 mmHg.   5. The inferior vena cava is normal in size with greater than 50%  respiratory variability, suggesting right atrial pressure of 3 mmHg.   2D echo 06/27/2021 1. Left ventricular ejection fraction, by estimation, is 40 to 45%. Left  ventricular ejection fraction by 2D MOD biplane is 42.3 %. The left  ventricle has mild to moderately decreased function. The left ventricle  demonstrates global hypokinesis. Left  ventricular diastolic parameters are consistent with Grade II diastolic  dysfunction (pseudonormalization). The average left ventricular global  longitudinal strain is -14.2 %. The global longitudinal strain is  abnormal.   2. Right ventricular systolic function is normal. The right ventricular  size is normal.   3. The mitral valve is degenerative. Mild mitral valve regurgitation.  Moderate mitral annular calcification.   4. The aortic valve is tricuspid. Aortic valve regurgitation is mild to  moderate.   5. The inferior vena cava is normal in size with greater than 50%  respiratory variability, suggesting right atrial pressure of 3 mmHg.    2D echo 08/18/2019 1. Left ventricular ejection fraction, by estimation, is 40 to 45%. The  left ventricle has mildly decreased function. The left ventricle  demonstrates global hypokinesis. Left ventricular diastolic parameters are  consistent with Grade I diastolic  dysfunction (impaired relaxation).   2. Right ventricular systolic function is normal. The right ventricular  size is normal. There is normal pulmonary artery systolic pressure.   3. The  mitral valve is normal in structure. Trivial mitral valve  regurgitation. No evidence of mitral stenosis.   4. The aortic valve is normal in structure. Aortic valve regurgitation is  moderate. Mild aortic valve sclerosis is present, with no evidence of  aortic valve stenosis.   5. The inferior vena cava is normal in size with greater than 50%  respiratory variability, suggesting right atrial pressure of 3 mmHg.    Risk Assessment/Calculations         Physical Exam VS:  BP 114/60 (BP Location: Left Arm, Patient Position: Sitting, Cuff Size: Normal)   Pulse 73   Ht 5' 5 (1.651 m)   Wt 171 lb 4.8 oz (77.7 kg)   LMP 07/12/2016   SpO2 99%   BMI 28.51 kg/m        Wt Readings from Last 3 Encounters:  12/08/23 171 lb 4.8 oz (77.7 kg)  12/01/23 170 lb 3.2 oz (77.2 kg)  11/18/23 173 lb 12.8 oz (78.8 kg)    GEN: Well nourished, well developed in no acute distress NECK: No JVD; No carotid bruits CARDIAC: RRR, II/VI systolic murmur RUSB and I/VI diastolic murmur LLSB, without rubs or gallops RESPIRATORY:  Clear to auscultation without rales, wheezing or rhonchi  ABDOMEN: Soft, non-tender, non-distended EXTREMITIES: Trace pretibial edema; No deformity   ASSESSMENT AND PLAN Combined systolic and diastolic heart failure/nonischemic cardiomyopathy dating [place] 2016 presumed to be viral  cardiomyopathy that was nonischemic.  She continues to suffer from NYHA class II symptoms but appears to be euvolemic on exam today.  Slight drop in most recent LVEF of 30-35% on echocardiogram done 10/19/2023 with mild LVH, G1 DD, mild MR and mild to moderate AR with mild aortic valve stenosis.  She was referred back to advanced heart failure to assist with new reduction after she was escalated on GDMT.  Her valsartan  was recently discontinued and she was started on Entresto  24/26 mg twice daily, Toprol -XL was changed to 12.5 mg at bedtime, she was escalated on spironolactone  to 25 mg daily, restarted on Jardiance   10 mg daily with escalation of medication she was sent for an updated BMP today.  She is also scheduled for an updated echocardiogram in the next 6 to 8 weeks to reevaluate EF after being back on escalating GDMT.  She has been encouraged to continue to maintain follow-ups with advanced heart failure clinic as well.  Peripheral edema varicose veins of bilateral lower extremity.  She is continued on furosemide  20 mg daily and additional 20 mg as needed and spironolactone  25 mg daily.  She is encouraged to continue to participate in conservative therapy of elevate extremities, foot Pumps, decrease in sodium intake and wear compression stockings.  Cardiac device status post ICD in situ.  She continues to be followed by EP.  EKG today reveals a sensed V paced with a rate of 73 functioning well with no significant changes noted.  Mixed hyperlipidemia LDL remains at goal.  She is on ezetimibe .       Dispo: Patient to return to clinic see MD/APP in 3 months or sooner if needed for further evaluation  Signed, Belita Warsame, NP

## 2023-12-08 NOTE — Patient Instructions (Signed)
 Medication Instructions:  Your physician recommends that you continue on your current medications as directed. Please refer to the Current Medication list given to you today.   *If you need a refill on your cardiac medications before your next appointment, please call your pharmacy*  Lab Work: Your provider would like for you to have following labs drawn today BMP.   If you have labs (blood work) drawn today and your tests are completely normal, you will receive your results only by: MyChart Message (if you have MyChart) OR A paper copy in the mail If you have any lab test that is abnormal or we need to change your treatment, we will call you to review the results.  Testing/Procedures: Your physician has requested that you have an echocardiogram. Echocardiography is a painless test that uses sound waves to create images of your heart. It provides your doctor with information about the size and shape of your heart and how well your heart's chambers and valves are working.   You may receive an ultrasound enhancing agent through an IV if needed to better visualize your heart during the echo. This procedure takes approximately one hour.  There are no restrictions for this procedure.  This will take place at [address] [facility] [address], [place] [address]  Please note: We ask at that you not bring children with you during ultrasound (echo/ vascular) testing. Due to room size and safety concerns, children are not allowed in the ultrasound rooms during exams. Our front office staff cannot provide observation of children in our lobby area while testing is being conducted. An adult accompanying a patient to their appointment will only be allowed in the ultrasound room at the discretion of the ultrasound technician under special circumstances. We apologize for any inconvenience.   Follow-Up: At [facility], you and your health needs are our priority.  As part of our  continuing mission to provide you with exceptional heart care, our providers are all part of one team.  This team includes your primary Cardiologist (physician) and Advanced Practice Providers or APPs (Physician Assistants and Nurse Practitioners) who all work together to provide you with the care you need, when you need it.  Your next appointment:   3 - 4 month(s)  Provider:   You may see Timothy Gollan, MD or one of the following Advanced Practice Providers on your designated Care Team:   Lonni Meager, NP Lesley Maffucci, PA-C Bernardino Bring, PA-C Cadence Juana Di­az, PA-C Tylene Lunch, NP Barnie Hila, NP

## 2023-12-09 ENCOUNTER — Ambulatory Visit: Payer: Self-pay | Admitting: Pharmacist

## 2023-12-09 LAB — BASIC METABOLIC PANEL WITH GFR
BUN/Creatinine Ratio: 17 (ref 12–28)
BUN: 20 mg/dL (ref 8–27)
CO2: 22 mmol/L (ref 20–29)
Calcium: 9.5 mg/dL (ref 8.7–10.3)
Chloride: 105 mmol/L (ref 96–106)
Creatinine, Ser: 1.2 mg/dL — ABNORMAL HIGH (ref 0.57–1.00)
Glucose: 82 mg/dL (ref 70–99)
Potassium: 5.4 mmol/L — ABNORMAL HIGH (ref 3.5–5.2)
Sodium: 143 mmol/L (ref 134–144)
eGFR: 51 mL/min/1.73 — ABNORMAL LOW (ref >59)

## 2023-12-09 NOTE — Progress Notes (Signed)
 Thanks for reaching out, feel free to decrease it back to 12.5 mg if she did stop her K supplement as instructed. If she accidentally continued potassium, then I think it would be fine to hold it one day then resume without Kcl. I'd also be happy to call her if you prefer that!

## 2023-12-09 NOTE — Progress Notes (Signed)
 Jon please reach out to Suzanne Spears and make sure she is not taking any potassium supplements.  If she is no longer taking potassium supplements have her reduce her spironolactone  from 25 mg daily to 12.5 mg daily.  Will also need to repeat her BMP in 1 week to ensure potassium level reduces to a lower level.

## 2023-12-14 ENCOUNTER — Ambulatory Visit: Attending: Cardiology

## 2023-12-14 DIAGNOSIS — I5042 Chronic combined systolic (congestive) and diastolic (congestive) heart failure: Secondary | ICD-10-CM

## 2023-12-14 DIAGNOSIS — Z95810 Presence of automatic (implantable) cardiac defibrillator: Secondary | ICD-10-CM | POA: Diagnosis not present

## 2023-12-15 ENCOUNTER — Telehealth: Payer: Self-pay | Admitting: Adult Health

## 2023-12-15 NOTE — Telephone Encounter (Signed)
 Called to confirm/remind patient of their appointment at the Advanced Heart Failure Clinic on 12/16/23.   Appointment:   [x] Confirmed  [] Left mess   [] No answer/No voice mail  [] VM Full/unable to leave message  [] Phone not in service  Patient reminded to bring all medications and/or complete list.  Confirmed patient has transportation. Gave directions, instructed to utilize valet parking.

## 2023-12-16 ENCOUNTER — Ambulatory Visit: Payer: Self-pay | Admitting: Family

## 2023-12-16 ENCOUNTER — Other Ambulatory Visit
Admission: RE | Admit: 2023-12-16 | Discharge: 2023-12-16 | Disposition: A | Discharge status: Home / Self Care | Source: Ambulatory Visit | Attending: Family | Admitting: Family

## 2023-12-16 ENCOUNTER — Encounter: Payer: Self-pay | Admitting: Adult Health

## 2023-12-16 ENCOUNTER — Ambulatory Visit (HOSPITAL_BASED_OUTPATIENT_CLINIC_OR_DEPARTMENT_OTHER): Admitting: Adult Health

## 2023-12-16 VITALS — BP 109/62 | HR 85 | Ht 65.0 in | Wt 171.0 lb

## 2023-12-16 DIAGNOSIS — I428 Other cardiomyopathies: Secondary | ICD-10-CM | POA: Diagnosis not present

## 2023-12-16 DIAGNOSIS — I351 Nonrheumatic aortic (valve) insufficiency: Secondary | ICD-10-CM

## 2023-12-16 DIAGNOSIS — I5042 Chronic combined systolic (congestive) and diastolic (congestive) heart failure: Secondary | ICD-10-CM | POA: Insufficient documentation

## 2023-12-16 DIAGNOSIS — Z95810 Presence of automatic (implantable) cardiac defibrillator: Secondary | ICD-10-CM

## 2023-12-16 LAB — BASIC METABOLIC PANEL WITH GFR
Anion gap: 7 (ref 5–15)
BUN: 23 mg/dL (ref 8–23)
CO2: 32 mmol/L (ref 22–32)
Calcium: 9.5 mg/dL (ref 8.9–10.3)
Chloride: 103 mmol/L (ref 98–111)
Creatinine, Ser: 1.24 mg/dL — ABNORMAL HIGH (ref 0.44–1.00)
GFR, Estimated: 49 mL/min — ABNORMAL LOW (ref >60)
Glucose, Bld: 105 mg/dL — ABNORMAL HIGH (ref 70–99)
Potassium: 3.3 mmol/L — ABNORMAL LOW (ref 3.5–5.1)
Sodium: 142 mmol/L (ref 135–145)

## 2023-12-16 NOTE — Progress Notes (Signed)
 EPIC Encounter for ICM Monitoring  Patient Name: Suzanne Spears is a 63 y.o. female Date: 12/16/2023 Primary Care Physican: Gardenia Led, DO Primary Cardiologist: Gollan/Sabharwal [facility] Electrophysiologist: Kennyth Pore Pacing:  98.2%    10/29/2022 Weight: 170 lbs 03/27/2023 Weight: 174 lbs 08/12/2023 Weight: 170.2 lbs 11/02/2023 Weight: 170 lbs   Clinical Status Since 02-Nov-2023 Time in AT/AF 0.0 hr/day (0.0%)          Transmission results reviewed.  Pt has office visit with HF clinic 12/16/2023.   Diet: Does not adhere to low salt diet   Since 11/02/2023 ICM Remote Transmission:  OptiVol Thoracic impedance suggesting normal fluid levels with the exception of possible fluid accumulation from 11/16/2023-11/21/2023 and 12/05/2023-12/10/2023.   Prescribed:  Furosemide  20 mg Take1 tablet (20 mg) by mouth as directed. Take 1 tablet daily. May take extra 20 mg as needed for weight gain or swelling.   Spironolactone  25 mg take 1 tablet daily   Labs: 12/08/2023 Creatinine 1.20, BUN 20, Potassium 5.4, Sodium 143, GFR 51 11/02/2023 Creatinine 1.47, BUN 29, Potassium 4.1, Sodium 139 11/02/2023 Creatinine 1.28, BUN 32, Potassium 3.9, Sodium 139, GFR 47 05/18/2023 Creatinine 1.34, BUN 24, Potassium 3.9, Sodium 139, GFR 43.66 12/08/2022 Creatinine 1.20, BUN 22, Potassium 3.6, Sodium 138, GFR 52 A complete set of results can be found in Results Review.   Recommendations:  No changes.  Any recommendations will be given at 12/16/2023 office visit with Greig Mosses, NP at HF clinic   Follow-up plan: [facility] phone appointment on 01/18/2024.  91 day device clinic remote transmission 01/26/2024.     EP/Cardiology Office Visits:  03/09/2024 with Maeola Lunch, NP.   03/08/2024 with Dr Kennyth.    12/16/2023 with Greig Mosses, NP at [facility] HF clinic.   Copy of ICM check sent to Dr. Kennyth.      Remote monitoring is medically necessary for Heart Failure Management.    Daily Thoracic Impedance ICM trend:  09/14/2023 through 12/14/2023.    12-14 Month Thoracic Impedance ICM trend:     Mitzie GORMAN Garner, RN 12/16/2023 11:16 AM

## 2023-12-16 NOTE — Patient Instructions (Signed)
 Medication Changes:  STOP Metoprolol   CONTINUE Spironolactone  12.5mg  daily   Special Instructions // Education:  Do the following things EVERYDAY: Weigh yourself in the morning before breakfast. Write it down and keep it in a log. Take your medicines as prescribed Eat low salt foods--Limit salt (sodium) to 2000 mg per day.  Stay as active as you can everyday Limit all fluids for the day to less than 2 liters   Follow-Up in: Please follow up with the Advanced Heart Failure Clinic in 6 weeks with Dr. Rolan.    Thank you for choosing [facility].    At the Advanced Heart Failure Clinic, you and your health needs are our priority. We have a designated team specialized in the treatment of Heart Failure. This Care Team includes your primary Heart Failure Specialized Cardiologist (physician), Advanced Practice Providers (APPs- Physician Assistants and Nurse Practitioners), and Pharmacist who all work together to provide you with the care you need, when you need it.   You may see any of the following providers on your designated Care Team at your next follow up:  Dr. Toribio Fuel Dr. Ezra Rolan Dr. Ria Commander Dr. Morene Brownie Ellouise Class, FNP Jaun Bash, RPH-CPP  Please be sure to bring in all your medications bottles to every appointment.   Need to Contact Us :  If you have any questions or concerns before your next appointment please send us  a message through [place] or call our office at [phone].    TO LEAVE A MESSAGE FOR THE NURSE SELECT OPTION 2, PLEASE LEAVE A MESSAGE INCLUDING: YOUR NAME DATE OF BIRTH CALL BACK NUMBER REASON FOR CALL**this is important as we prioritize the call backs  YOU WILL RECEIVE A CALL BACK THE SAME DAY AS LONG AS YOU CALL BEFORE 4:00 PM

## 2023-12-16 NOTE — Progress Notes (Signed)
   ADVANCED HEART FAILURE CLINIC NOTE  Referring Physician: Gardenia Led, DO  Primary Care: Gardenia Led, DO Primary Cardiologist: Evalene Lunger, MD Heart Failure: Previous  Led Gardenia, DO  CC: Chronic systolic heart failure  HPI: Suzanne Spears is a 63 y.o. female with HFrEF 2/2 NICM s/p CRT-D, LBBB, hx of PE, CKD III, and HTN.    Pertinent Family & social hx: No tobacco use.   Cardiac History:  - Nonischemic / viral cardiomyopathy diagnosed in 2016 - CRTD Implanted in 2016>lead dislodgement requiring epicardial lead placement complicated by post-op PE.  - 10/19/23: Drop in LVEF to 30-35%, AHF consulted outpatient. Lost insurance coverage prior to this requiring transition to generic meds & ARBs.   In September she saw Dr Gardenia and was started on bb. Since that time she has been extremely tired and has difficulty completing task due to fatigue.     Interval hx:  - Today she returns for HF follow up. Complaining of fatigue. Gets SOB with exertion. Denies PND/Orthopnea. Appetite ok. No fever or chills. Weight at home 168-171. She taking lasix  as needed and took 2 lasix  today due to weight gain. Taking all medications.   PHYSICAL EXAM: Vitals:   12/16/23 1442  BP: 109/62  Pulse: 85  SpO2: 98%   Wt Readings from Last 3 Encounters:  12/16/23 171 lb (77.6 kg)  12/08/23 171 lb 4.8 oz (77.7 kg)  12/01/23 170 lb 3.2 oz (77.2 kg)    General:   No resp difficulty Neck: no JVD.  Cor: Regular rate & rhythm.  Lungs: clear Abdomen: soft, nontender, nondistended.  Extremities: no  edema Neuro: alert & oriented x3    DATA REVIEW  ECG: 09/04/23: Sinus, QRSd  as per my personal interpretation  ECHO: 02/06/2017: LVEF 50-55% 06/27/21: LVEF 40-45%, normal RV function  10/19/23: LVEF 30-35%  CPX: 02/22/2016: submax effort; markedly elevated VE/VCO2 due to hyperventilation; mild-moderate cardiac limitation.     ASSESSMENT & PLAN:  Heart Failure with  reduced fraction - Etiology & History: Nonischemic cardiomyopathy - NYHA Class: NYHA IIIC - Volume status: Appears euvolemic. Continue lasix  as needed.  GDMT  -  RAASi:  Entresto  24/26mg  BID Beta-blocker: Stop Toprol  with profound fatigue.   -  Spironolactone  25mg  daily  -  SGLT2i: Continue jardiance  10 mg daily.   - ICD: CRT-D implanted 12/16, lead dislodgement requiring epicardial lead placement.  Reviewed device interrogation- Impedance up. Activity down < 1 hour per day.  We discussed with if she continues to feel fatigued after stopping bb will set up for CPX test to further assess. CPX test are completed at [facility]. We discussed.  She has an ECHO next month.   - Advanced therapies: Not indicated.  - Plan to repeat echo in November. Consider cardiac rehab.   2. Aortic regurgitation  - Reviewed TTE from 10/19/23; appears to have moderate AI. Repeat Echo next month.  - Will adjust GDMT   3. Hyperlipidemia  - Continue lipitor    Follow up in 6 weeks with HF MD. Consider CPX test at time.  Yalda Herd NP-C

## 2023-12-20 ENCOUNTER — Ambulatory Visit: Payer: Self-pay | Admitting: Cardiology

## 2023-12-22 ENCOUNTER — Other Ambulatory Visit: Admission: RE | Admit: 2023-12-22 | Source: Home / Self Care

## 2024-01-18 ENCOUNTER — Telehealth: Payer: Self-pay

## 2024-01-18 ENCOUNTER — Ambulatory Visit: Attending: Cardiology

## 2024-01-18 DIAGNOSIS — Z95810 Presence of automatic (implantable) cardiac defibrillator: Secondary | ICD-10-CM

## 2024-01-18 DIAGNOSIS — I5042 Chronic combined systolic (congestive) and diastolic (congestive) heart failure: Secondary | ICD-10-CM | POA: Diagnosis not present

## 2024-01-18 NOTE — Telephone Encounter (Signed)
 Remote ICM transmission received.  Attempted call to patient regarding ICM remote transmission.  Left detailed message per DPR with ICM phone number to return call for any questions, concerns or fluid symptoms.

## 2024-01-18 NOTE — Progress Notes (Signed)
 EPIC Encounter for ICM Monitoring  Patient Name: Suzanne Spears is a 63 y.o. female Date: 01/18/2024 Primary Care Physican: Marylynn Verneita CROME, MD Primary Cardiologist: [facility] Electrophysiologist: Kennyth Pore Pacing:  96.5%    10/29/2022 Weight: 170 lbs 03/27/2023 Weight: 174 lbs 08/12/2023 Weight: 170.2 lbs 11/02/2023 Weight: 170 lbs 12/16/2023 Office Weight: 171 lbs   Clinical Status Since 16-Dec-2023 Time in AT/AF <0.1 hr/day (<0.1%) VT-NS (>4 beats, >200 bpm) 1         Attempted call to patient and unable to reach.  Left detailed message per DPR regarding transmission.  Transmission results reviewed.    Diet: Not restrictive on salt intake   Since 12/14/2023 ICM Remote Transmission:  OptiVol Thoracic impedance suggesting most days have possible fluid accumulation.    Prescribed:  Furosemide  20 mg Take1 tablet (20 mg) by mouth as needed.  Take extra 20 mg as needed. Spironolactone  25 mg take 1 tablet daily   Labs: 12/16/2023 Creatinine 1.24, BUN 23, Potassium 3.3, Sodium 142, GFR 49 12/08/2023 Creatinine 1.20, BUN 20, Potassium 5.4, Sodium 143, GFR 51 11/02/2023 Creatinine 1.47, BUN 29, Potassium 4.1, Sodium 139 11/02/2023 Creatinine 1.28, BUN 32, Potassium 3.9, Sodium 139, GFR 47 05/18/2023 Creatinine 1.34, BUN 24, Potassium 3.9, Sodium 139, GFR 43.66 12/08/2022 Creatinine 1.20, BUN 22, Potassium 3.6, Sodium 138, GFR 52 A complete set of results can be found in Results Review.   Recommendations:  Left voice mail with ICM number and encouraged to call if experiencing any fluid symptoms.   Follow-up plan: ICM clinic phone appointment on 02/29/2024.  91 day device clinic remote transmission 01/26/2024.     EP/Cardiology Office Visits:  03/09/2024 with Maeola Lunch, NP.   03/08/2024 with Suzann Riddle, NP.    02/01/2024 with Dr Rolan.   Copy of ICM check sent to Dr. Kennyth.     Remote monitoring is medically necessary for Heart Failure Management.    Daily  Thoracic Impedance ICM trend: 10/19/2023 through 01/18/2024.    12-14 Month Thoracic Impedance ICM trend:     Suzanne GORMAN Garner, RN 01/18/2024 8:53 AM

## 2024-01-20 ENCOUNTER — Ambulatory Visit: Attending: Cardiology

## 2024-01-20 DIAGNOSIS — I428 Other cardiomyopathies: Secondary | ICD-10-CM

## 2024-01-20 LAB — ECHOCARDIOGRAM LIMITED
AV Mean grad: 11 mmHg
AV Peak grad: 19.9 mmHg
Ao pk vel: 2.23 m/s
Area-P 1/2: 4.31 cm2
S' Lateral: 4.09 cm

## 2024-01-25 ENCOUNTER — Telehealth: Payer: Self-pay | Admitting: Cardiology

## 2024-01-25 NOTE — Telephone Encounter (Signed)
 Called patient to review questions regarding echo - all questions answered - patient verbalized understanding

## 2024-01-25 NOTE — Telephone Encounter (Signed)
 Pt would like a c/b to discuss Echo results please advise

## 2024-01-26 ENCOUNTER — Ambulatory Visit: Payer: Self-pay

## 2024-01-26 DIAGNOSIS — I5042 Chronic combined systolic (congestive) and diastolic (congestive) heart failure: Secondary | ICD-10-CM | POA: Diagnosis not present

## 2024-01-27 ENCOUNTER — Other Ambulatory Visit: Payer: Self-pay | Admitting: Internal Medicine

## 2024-01-27 LAB — CUP PACEART REMOTE DEVICE CHECK
Battery Remaining Longevity: 68 mo
Battery Voltage: 2.99 V
Brady Statistic AP VP Percent: 2.06 %
Brady Statistic AP VS Percent: 0.1 %
Brady Statistic AS VP Percent: 96.36 %
Brady Statistic AS VS Percent: 1.49 %
Brady Statistic RA Percent Paced: 2.15 %
Brady Statistic RV Percent Paced: 8.2 %
Date Time Interrogation Session: 20251202063425
HighPow Impedance: 86 Ohm
Implantable Lead Connection Status: 753985
Implantable Lead Connection Status: 753985
Implantable Lead Connection Status: 753985
Implantable Lead Connection Status: 753985
Implantable Lead Implant Date: 20160421
Implantable Lead Implant Date: 20160421
Implantable Lead Implant Date: 20161006
Implantable Lead Implant Date: 20161006
Implantable Lead Location: 753858
Implantable Lead Location: 753858
Implantable Lead Location: 753859
Implantable Lead Location: 753860
Implantable Lead Model: 5071
Implantable Lead Model: 5071
Implantable Lead Model: 5076
Implantable Pulse Generator Implant Date: 20230831
Lead Channel Impedance Value: 323 Ohm
Lead Channel Impedance Value: 380 Ohm
Lead Channel Impedance Value: 4047 Ohm
Lead Channel Impedance Value: 4047 Ohm
Lead Channel Impedance Value: 646 Ohm
Lead Channel Impedance Value: 912 Ohm
Lead Channel Pacing Threshold Amplitude: 0.5 V
Lead Channel Pacing Threshold Amplitude: 0.875 V
Lead Channel Pacing Threshold Amplitude: 1.125 V
Lead Channel Pacing Threshold Pulse Width: 0.4 ms
Lead Channel Pacing Threshold Pulse Width: 0.4 ms
Lead Channel Pacing Threshold Pulse Width: 0.6 ms
Lead Channel Sensing Intrinsic Amplitude: 16 mV
Lead Channel Sensing Intrinsic Amplitude: 16 mV
Lead Channel Sensing Intrinsic Amplitude: 2.875 mV
Lead Channel Sensing Intrinsic Amplitude: 2.875 mV
Lead Channel Setting Pacing Amplitude: 1 V
Lead Channel Setting Pacing Amplitude: 1.5 V
Lead Channel Setting Pacing Amplitude: 1.75 V
Lead Channel Setting Pacing Pulse Width: 0.4 ms
Lead Channel Setting Pacing Pulse Width: 0.6 ms
Lead Channel Setting Sensing Sensitivity: 0.3 mV
Zone Setting Status: 755011
Zone Setting Status: 755011

## 2024-01-29 ENCOUNTER — Telehealth: Payer: Self-pay | Admitting: Cardiology

## 2024-01-29 NOTE — Telephone Encounter (Signed)
 Called to confirm/remind patient of their appointment at the Advanced Heart Failure Clinic on 02/01/24.   Appointment:   [x] Confirmed  [] Left mess   [] No answer/No voice mail  [] VM Full/unable to leave message  [] Phone not in service  Patient reminded to bring all medications and/or complete list.  Confirmed patient has transportation. Gave directions, instructed to utilize valet parking.

## 2024-01-29 NOTE — Progress Notes (Signed)
 Remote ICD Transmission

## 2024-01-31 ENCOUNTER — Ambulatory Visit: Payer: Self-pay | Admitting: Cardiology

## 2024-02-01 ENCOUNTER — Ambulatory Visit: Admitting: Cardiology

## 2024-02-29 ENCOUNTER — Ambulatory Visit: Payer: Self-pay | Attending: Cardiology

## 2024-02-29 DIAGNOSIS — Z95810 Presence of automatic (implantable) cardiac defibrillator: Secondary | ICD-10-CM

## 2024-02-29 DIAGNOSIS — I5042 Chronic combined systolic (congestive) and diastolic (congestive) heart failure: Secondary | ICD-10-CM

## 2024-03-04 NOTE — Progress Notes (Signed)
 EPIC Encounter for ICM Monitoring  Patient Name: Suzanne Spears is a 64 y.o. female Date: 03/04/2024 Primary Care Physican: Marylynn Verneita CROME, MD Primary Cardiologist: Gollan/McLean Electrophysiologist: Kennyth Pore Pacing:  96.5%    10/29/2022 Weight: 170 lbs 03/27/2023 Weight: 174 lbs 08/12/2023 Weight: 170.2 lbs 11/02/2023 Weight: 170 lbs 12/16/2023 Office Weight: 171 lbs   Clinical Status Since 16-Dec-2023 Time in AT/AF <0.1 hr/day (<0.1%) VT-NS (>4 beats, >200 bpm) 1         Transmission results reviewed.    Diet: Not restrictive on salt intake   Since 12/14/2023 ICM Remote Transmission:  OptiVol Thoracic impedance suggesting intermittent days possible fluid accumulation.    Prescribed:  Furosemide  20 mg Take1 tablet (20 mg) by mouth as needed.  Take extra 20 mg as needed. Spironolactone  25 mg take 1 tablet daily   Labs: 12/16/2023 Creatinine 1.24, BUN 23, Potassium 3.3, Sodium 142, GFR 49 12/08/2023 Creatinine 1.20, BUN 20, Potassium 5.4, Sodium 143, GFR 51 11/02/2023 Creatinine 1.47, BUN 29, Potassium 4.1, Sodium 139 11/02/2023 Creatinine 1.28, BUN 32, Potassium 3.9, Sodium 139, GFR 47 05/18/2023 Creatinine 1.34, BUN 24, Potassium 3.9, Sodium 139, GFR 43.66 12/08/2022 Creatinine 1.20, BUN 22, Potassium 3.6, Sodium 138, GFR 52 A complete set of results can be found in Results Review.   Recommendations: None   Follow-up plan: ICM clinic phone appointment on 04/04/2024.  91 day device clinic remote transmission 04/26/2024.     EP/Cardiology Office Visits:  03/09/2024 with Maeola Lunch, NP.   03/08/2024 with Chantal Needle, NP.    04/19/2023 with Dr Rolan.   Copy of ICM check sent to Dr. Kennyth.     Remote monitoring is medically necessary for Heart Failure Management.    Daily Thoracic Impedance ICM trend: 11/30/2023 through 02/29/2024.    12-14 Month Thoracic Impedance ICM trend:     Mitzie GORMAN Garner, RN 03/04/2024 1:10 PM

## 2024-03-07 NOTE — Progress Notes (Unsigned)
 "     Electrophysiology Clinic Note    Date:  03/08/2024  Patient ID:  [name], [name] [birth year], MRN [id number] PCP:  Marylynn Verneita CROME, MD  Cardiologist:  Evalene Lunger, MD  Cardiology APP:  Gerard Frederick, NP  Electrophysiologist:  Fonda Kitty, MD  Advanced Heart Failure:  Ria Commander, DO    Discussed the use of AI scribe software for clinical note transcription with the patient, who gave verbal consent to proceed.   Patient Profile    Chief Complaint: device follow-up  History of Present Illness: Suzanne Spears is a 64 y.o. female with PMH notable for NICM s/p CRT-D, HFrEF, HTN, CKD-3; seen today for Fonda Kitty, MD (Previously Dr. Fernande) for routine electrophysiology followup.   She last saw Dr. Fernande 01/2023 at which time she was doing well.  She last saw NP Hammock 11/2023 where she had increased fatigue, recommended updated TTE after GDMT adjustments which showed slight improvement.   On follow-up today, she is having intermittent brief sensations of her heart skipping a beat or feeling funky .  She does not have chest pain, chest pressure,  or shortness of breath during the episode, just a strange sensation in her chest.  The episodes are very short duration.  She continues to take Lasix  as needed.  She noticed that her weight was increased this morning and took Lasix  prior to our visit.  She thinks this was because of dietary indiscretions around the holidays.  She does not have increased lower extremity edema or abdominal fullness.  Uses her weight as guide to her fluid status.  She continues to have occasional lightheadedness always associated with position changes.  Has improved since medication adjustments recently by [facility].      Arrhythmia/Device History MDT CRT-D, imp 2016 - gen change 2023 > lead dislodgement, now s/p epicardial lead placement    ROS:  Please see the history of present illness. All other systems are reviewed and otherwise  negative.    Physical Exam    VS:  BP 92/62 (BP Location: Left Arm, Patient Position: Sitting, Cuff Size: Normal)   Pulse 83   Ht 5' 5.5 (1.664 m)   Wt 175 lb (79.4 kg)   LMP 07/12/2016   SpO2 96%   BMI 28.68 kg/m  BMI: Body mass index is 28.68 kg/m.           Wt Readings from Last 3 Encounters:  03/08/24 175 lb (79.4 kg)  12/16/23 171 lb (77.6 kg)  12/08/23 171 lb 4.8 oz (77.7 kg)      GEN- The patient is well appearing, alert and oriented x 3 today.   Lungs- Clear to ausculation bilaterally, normal work of breathing.  Heart- Regular rate and rhythm, no murmurs, rubs or gallops Extremities- No peripheral edema, warm, dry Skin-   device pocket well-healed, no tethering   Device interrogation done today and reviewed by myself:  Battery 5.6 years Lead thresholds, impedence, sensing stable  Single very brief NSVT episode VP 97% Optivol rising No changes made today   Studies Reviewed   Previous EP, cardiology notes.    EKG is ordered. Personal review of EKG from today shows:    EKG Interpretation Date/Time:  Tuesday March 08 2024 09:09:29 EST Ventricular Rate:  89 PR Interval:  110 QRS Duration:  110 QT Interval:  426 QTC Calculation: 518 R Axis:   -87  Text Interpretation: Atrial-sensed ventricular-paced rhythm Confirmed by Mckale Haffey [phone]) on 03/08/2024 9:14:48 AM  Limited TTE, 01/20/2024  1. Left ventricular ejection fraction, by estimation, is 35 to 40%. Left ventricular ejection fraction by PLAX is 39 %. The left ventricle has moderately decreased function. The left ventricle demonstrates global hypokinesis. Left ventricular diastolic parameters are consistent with Grade I diastolic dysfunction (impaired relaxation).   2. Right ventricular systolic function is normal.   3. Mild mitral valve regurgitation.   4. Aortic valve regurgitation is moderate. Mild aortic valve stenosis. Aortic valve mean gradient measures 11.0 mmHg.   5. The inferior  vena cava is normal in size with greater than 50% respiratory variability, suggesting right atrial pressure of 3 mmHg.   TTE, 10/19/2023  1. Left ventricular ejection fraction, by estimation, is 30 to 35%. The left ventricle has moderate to severely decreased function. The left ventricle demonstrates global hypokinesis. There is mild left ventricular hypertrophy. Left ventricular diastolic parameters are consistent with Grade I diastolic dysfunction (impaired relaxation). The average left ventricular global longitudinal strain is -10.5 %.   2. Right ventricular systolic function is normal. The right ventricular size is normal.   3. The mitral valve is normal in structure. Mild mitral valve regurgitation.   4. The aortic valve is tricuspid. Aortic valve regurgitation is mild to moderate. Mild aortic valve stenosis. Aortic valve mean gradient measures 11.0 mmHg.   5. The inferior vena cava is normal in size with greater than 50% respiratory variability, suggesting right atrial pressure of 3 mmHg.   TTE, 06/27/2021  1. Left ventricular ejection fraction, by estimation, is 40 to 45%. Left ventricular ejection fraction by 2D MOD biplane is 42.3 %. The left ventricle has mild to moderately decreased function. The left ventricle demonstrates global hypokinesis. Left ventricular diastolic parameters are consistent with Grade II diastolic dysfunction (pseudonormalization). The average left ventricular global longitudinal strain is -14.2 %. The global longitudinal strain is abnormal.   2. Right ventricular systolic function is normal. The right ventricular size is normal.   3. The mitral valve is degenerative. Mild mitral valve regurgitation. Moderate mitral annular calcification.   4. The aortic valve is tricuspid. Aortic valve regurgitation is mild to moderate.   5. The inferior vena cava is normal in size with greater than 50% respiratory variability, suggesting right atrial pressure of 3 mmHg.    Assessment  and Plan     #) NICM s/p CRT-D Device functioning well, see Paceart for details Battery good Stable lead measurements No concerning arrhythmias 97% VP   #) HFrEF Appears euvolemic on exam, though weight slightly increased and elevated optivol She took PRN lasix  today, we discussed using tomorrow's weight as guide and that she may need 2-3 days of PRN lasix .  Continue GDMT per gen cards      Current medicines are reviewed at length with the patient today.   The patient does not have concerns regarding her medicines.  The following changes were made today:  none  Labs/ tests ordered today include:  Orders Placed This Encounter  Procedures   EKG 12-Lead     Disposition: Follow up with Dr. Kennyth or EP APP in 12 months, continue remote monitoring  Follow up as scheduled with HF team  Signed, Ethin Drummond, NP  03/08/2024  7:30 PM  Electrophysiology [facility] "

## 2024-03-08 ENCOUNTER — Ambulatory Visit: Admitting: Cardiology

## 2024-03-08 ENCOUNTER — Ambulatory Visit: Payer: Self-pay | Attending: Cardiology | Admitting: Cardiology

## 2024-03-08 VITALS — BP 92/62 | HR 83 | Ht 65.5 in | Wt 175.0 lb

## 2024-03-08 DIAGNOSIS — I420 Dilated cardiomyopathy: Secondary | ICD-10-CM

## 2024-03-08 DIAGNOSIS — Z95810 Presence of automatic (implantable) cardiac defibrillator: Secondary | ICD-10-CM | POA: Diagnosis not present

## 2024-03-08 DIAGNOSIS — I5022 Chronic systolic (congestive) heart failure: Secondary | ICD-10-CM | POA: Diagnosis not present

## 2024-03-08 NOTE — Patient Instructions (Signed)
 Medication Instructions:  Your physician recommends that you continue on your current medications as directed. Please refer to the Current Medication list given to you today.  *If you need a refill on your cardiac medications before your next appointment, please call your pharmacy*  Lab Work: No labs ordered today    Testing/Procedures: No test ordered today   Follow-Up: At [facility], you and your health needs are our priority.  As part of our continuing mission to provide you with exceptional heart care, our providers are all part of one team.  This team includes your primary Cardiologist (physician) and Advanced Practice Providers or APPs (Physician Assistants and Nurse Practitioners) who all work together to provide you with the care you need, when you need it.  Your next appointment:   1 year(s)  Provider:   Suzann Riddle, NP or Dr. Fonda Kitty

## 2024-03-09 ENCOUNTER — Ambulatory Visit: Payer: Self-pay | Admitting: Cardiology

## 2024-03-14 ENCOUNTER — Ambulatory Visit: Payer: Self-pay | Admitting: Cardiology

## 2024-03-14 LAB — CUP PACEART INCLINIC DEVICE CHECK
Date Time Interrogation Session: 20260114073744
Implantable Lead Connection Status: 753985
Implantable Lead Connection Status: 753985
Implantable Lead Connection Status: 753985
Implantable Lead Connection Status: 753985
Implantable Lead Implant Date: 20160421
Implantable Lead Implant Date: 20160421
Implantable Lead Implant Date: 20161006
Implantable Lead Implant Date: 20161006
Implantable Lead Location: 753858
Implantable Lead Location: 753858
Implantable Lead Location: 753859
Implantable Lead Location: 753860
Implantable Lead Model: 5071
Implantable Lead Model: 5071
Implantable Lead Model: 5076
Implantable Pulse Generator Implant Date: 20230831

## 2024-03-24 NOTE — Progress Notes (Signed)
 31 day ICM Remote transmission canceled due to [facility] clinic is on hold until further notice.  91 day remote monitoring will continue per protocol.

## 2024-04-04 ENCOUNTER — Ambulatory Visit: Payer: Self-pay

## 2024-04-18 ENCOUNTER — Ambulatory Visit: Admitting: Cardiology

## 2024-04-21 ENCOUNTER — Ambulatory Visit (INDEPENDENT_AMBULATORY_CARE_PROVIDER_SITE_OTHER): Admitting: Nurse Practitioner

## 2024-04-21 ENCOUNTER — Ambulatory Visit (INDEPENDENT_AMBULATORY_CARE_PROVIDER_SITE_OTHER): Admitting: Vascular Surgery

## 2024-04-26 ENCOUNTER — Ambulatory Visit: Payer: Self-pay

## 2024-05-19 ENCOUNTER — Encounter: Admitting: Internal Medicine

## 2024-07-26 ENCOUNTER — Ambulatory Visit: Payer: Self-pay

## 2024-10-25 ENCOUNTER — Ambulatory Visit: Payer: Self-pay

## 2025-01-24 ENCOUNTER — Ambulatory Visit: Payer: Self-pay

## 2025-04-25 ENCOUNTER — Ambulatory Visit: Payer: Self-pay
